# Patient Record
Sex: Female | Born: 1960 | Race: White | Hispanic: No | Smoking: Former smoker
Health system: Southern US, Community
[De-identification: ages and names within clinical notes are randomized; demographics above are authoritative.]

## PROBLEM LIST (undated history)

## (undated) DIAGNOSIS — D682 Hereditary deficiency of other clotting factors: Secondary | ICD-10-CM

## (undated) DIAGNOSIS — E039 Hypothyroidism, unspecified: Secondary | ICD-10-CM

## (undated) DIAGNOSIS — Z86010 Personal history of colonic polyps: Secondary | ICD-10-CM

## (undated) DIAGNOSIS — J45909 Unspecified asthma, uncomplicated: Secondary | ICD-10-CM

## (undated) DIAGNOSIS — R112 Nausea with vomiting, unspecified: Secondary | ICD-10-CM

## (undated) DIAGNOSIS — J383 Other diseases of vocal cords: Secondary | ICD-10-CM

## (undated) DIAGNOSIS — M1990 Unspecified osteoarthritis, unspecified site: Secondary | ICD-10-CM

## (undated) DIAGNOSIS — K219 Gastro-esophageal reflux disease without esophagitis: Secondary | ICD-10-CM

## (undated) DIAGNOSIS — R011 Cardiac murmur, unspecified: Secondary | ICD-10-CM

## (undated) DIAGNOSIS — G709 Myoneural disorder, unspecified: Secondary | ICD-10-CM

## (undated) DIAGNOSIS — N816 Rectocele: Secondary | ICD-10-CM

## (undated) DIAGNOSIS — R7303 Prediabetes: Secondary | ICD-10-CM

## (undated) DIAGNOSIS — E785 Hyperlipidemia, unspecified: Secondary | ICD-10-CM

## (undated) DIAGNOSIS — C801 Malignant (primary) neoplasm, unspecified: Secondary | ICD-10-CM

## (undated) DIAGNOSIS — C4490 Unspecified malignant neoplasm of skin, unspecified: Secondary | ICD-10-CM

## (undated) DIAGNOSIS — K760 Fatty (change of) liver, not elsewhere classified: Secondary | ICD-10-CM

## (undated) DIAGNOSIS — M797 Fibromyalgia: Secondary | ICD-10-CM

## (undated) DIAGNOSIS — R0600 Dyspnea, unspecified: Secondary | ICD-10-CM

## (undated) DIAGNOSIS — I10 Essential (primary) hypertension: Secondary | ICD-10-CM

## (undated) DIAGNOSIS — Z9889 Other specified postprocedural states: Secondary | ICD-10-CM

## (undated) HISTORY — DX: Fatty (change of) liver, not elsewhere classified: K76.0

## (undated) HISTORY — PX: DILATION AND CURETTAGE OF UTERUS: SHX78

## (undated) HISTORY — PX: APPENDECTOMY: SHX54

## (undated) HISTORY — DX: Cardiac murmur, unspecified: R01.1

## (undated) HISTORY — PX: BREAST SURGERY: SHX581

## (undated) HISTORY — PX: BREAST EXCISIONAL BIOPSY: SUR124

## (undated) HISTORY — DX: Rectocele: N81.6

## (undated) HISTORY — PX: NOSE SURGERY: SHX723

## (undated) HISTORY — DX: Personal history of colonic polyps: Z86.010

## (undated) HISTORY — PX: ABLATION: SHX5711

## (undated) HISTORY — DX: Other diseases of vocal cords: J38.3

---

## 2001-11-09 ENCOUNTER — Other Ambulatory Visit: Admission: RE | Admit: 2001-11-09 | Discharge: 2001-11-09 | Payer: Self-pay | Admitting: Obstetrics and Gynecology

## 2002-12-07 ENCOUNTER — Other Ambulatory Visit: Admission: RE | Admit: 2002-12-07 | Discharge: 2002-12-07 | Payer: Self-pay | Admitting: Obstetrics and Gynecology

## 2003-01-27 ENCOUNTER — Encounter (INDEPENDENT_AMBULATORY_CARE_PROVIDER_SITE_OTHER): Payer: Self-pay | Admitting: Specialist

## 2003-01-27 ENCOUNTER — Ambulatory Visit (HOSPITAL_COMMUNITY): Admission: RE | Admit: 2003-01-27 | Discharge: 2003-01-27 | Payer: Self-pay | Admitting: Obstetrics and Gynecology

## 2003-04-28 ENCOUNTER — Encounter: Payer: Self-pay | Admitting: Internal Medicine

## 2004-02-22 ENCOUNTER — Ambulatory Visit: Payer: Self-pay | Admitting: Internal Medicine

## 2004-03-04 ENCOUNTER — Other Ambulatory Visit: Admission: RE | Admit: 2004-03-04 | Discharge: 2004-03-04 | Payer: Self-pay | Admitting: Obstetrics and Gynecology

## 2004-03-13 ENCOUNTER — Encounter: Admission: RE | Admit: 2004-03-13 | Discharge: 2004-03-13 | Payer: Self-pay | Admitting: Family Medicine

## 2004-04-05 ENCOUNTER — Ambulatory Visit: Payer: Self-pay | Admitting: Internal Medicine

## 2004-07-04 ENCOUNTER — Ambulatory Visit (HOSPITAL_COMMUNITY): Admission: RE | Admit: 2004-07-04 | Discharge: 2004-07-04 | Payer: Self-pay | Admitting: Endocrinology

## 2004-12-10 ENCOUNTER — Ambulatory Visit (HOSPITAL_COMMUNITY): Admission: RE | Admit: 2004-12-10 | Discharge: 2004-12-10 | Payer: Self-pay | Admitting: Endocrinology

## 2005-03-13 ENCOUNTER — Encounter (INDEPENDENT_AMBULATORY_CARE_PROVIDER_SITE_OTHER): Payer: Self-pay | Admitting: Specialist

## 2005-03-13 ENCOUNTER — Encounter: Admission: RE | Admit: 2005-03-13 | Discharge: 2005-03-13 | Payer: Self-pay | Admitting: Endocrinology

## 2005-03-13 ENCOUNTER — Other Ambulatory Visit: Admission: RE | Admit: 2005-03-13 | Discharge: 2005-03-13 | Payer: Self-pay | Admitting: Interventional Radiology

## 2005-04-10 ENCOUNTER — Other Ambulatory Visit: Admission: RE | Admit: 2005-04-10 | Discharge: 2005-04-10 | Payer: Self-pay | Admitting: Obstetrics and Gynecology

## 2005-07-11 ENCOUNTER — Ambulatory Visit (HOSPITAL_COMMUNITY): Admission: RE | Admit: 2005-07-11 | Discharge: 2005-07-11 | Payer: Self-pay | Admitting: Endocrinology

## 2006-02-23 ENCOUNTER — Encounter: Admission: RE | Admit: 2006-02-23 | Discharge: 2006-02-23 | Payer: Self-pay | Admitting: *Deleted

## 2006-06-25 ENCOUNTER — Ambulatory Visit (HOSPITAL_COMMUNITY): Admission: RE | Admit: 2006-06-25 | Discharge: 2006-06-25 | Payer: Self-pay | Admitting: Family Medicine

## 2007-03-17 ENCOUNTER — Ambulatory Visit: Payer: Self-pay | Admitting: Internal Medicine

## 2007-09-27 ENCOUNTER — Encounter: Admission: RE | Admit: 2007-09-27 | Discharge: 2007-09-27 | Payer: Self-pay | Admitting: Rheumatology

## 2007-10-20 ENCOUNTER — Encounter: Admission: RE | Admit: 2007-10-20 | Discharge: 2007-10-20 | Payer: Self-pay | Admitting: Rheumatology

## 2007-12-23 ENCOUNTER — Telehealth: Payer: Self-pay | Admitting: Internal Medicine

## 2007-12-30 ENCOUNTER — Encounter: Admission: RE | Admit: 2007-12-30 | Discharge: 2007-12-30 | Payer: Self-pay | Admitting: Family Medicine

## 2008-02-23 ENCOUNTER — Ambulatory Visit: Payer: Self-pay | Admitting: Internal Medicine

## 2008-03-09 ENCOUNTER — Ambulatory Visit: Payer: Self-pay | Admitting: Internal Medicine

## 2008-03-20 ENCOUNTER — Telehealth: Payer: Self-pay | Admitting: Internal Medicine

## 2008-04-12 ENCOUNTER — Encounter: Admission: RE | Admit: 2008-04-12 | Discharge: 2008-04-12 | Payer: Self-pay | Admitting: Sports Medicine

## 2008-04-27 ENCOUNTER — Encounter: Admission: RE | Admit: 2008-04-27 | Discharge: 2008-04-27 | Payer: Self-pay | Admitting: Sports Medicine

## 2008-06-26 ENCOUNTER — Encounter: Admission: RE | Admit: 2008-06-26 | Discharge: 2008-06-26 | Payer: Self-pay | Admitting: Sports Medicine

## 2008-12-01 ENCOUNTER — Encounter: Admission: RE | Admit: 2008-12-01 | Discharge: 2008-12-01 | Payer: Self-pay | Admitting: Sports Medicine

## 2009-01-03 ENCOUNTER — Encounter: Admission: RE | Admit: 2009-01-03 | Discharge: 2009-01-03 | Payer: Self-pay | Admitting: Family Medicine

## 2009-02-05 ENCOUNTER — Encounter: Admission: RE | Admit: 2009-02-05 | Discharge: 2009-02-05 | Payer: Self-pay | Admitting: Obstetrics and Gynecology

## 2009-02-05 ENCOUNTER — Encounter: Admission: RE | Admit: 2009-02-05 | Discharge: 2009-02-05 | Payer: Self-pay | Admitting: Sports Medicine

## 2009-04-23 ENCOUNTER — Encounter: Admission: RE | Admit: 2009-04-23 | Discharge: 2009-04-23 | Payer: Self-pay | Admitting: Sports Medicine

## 2009-04-27 ENCOUNTER — Ambulatory Visit: Payer: Self-pay | Admitting: Oncology

## 2009-05-07 LAB — COMPREHENSIVE METABOLIC PANEL
BUN: 14 mg/dL (ref 6–23)
CO2: 28 mEq/L (ref 19–32)
Calcium: 8.9 mg/dL (ref 8.4–10.5)
Chloride: 103 mEq/L (ref 96–112)
Creatinine, Ser: 0.64 mg/dL (ref 0.40–1.20)
Glucose, Bld: 86 mg/dL (ref 70–99)

## 2009-05-07 LAB — CBC WITH DIFFERENTIAL/PLATELET
Basophils Absolute: 0.1 10*3/uL (ref 0.0–0.1)
Eosinophils Absolute: 0.1 10*3/uL (ref 0.0–0.5)
HCT: 39.3 % (ref 34.8–46.6)
HGB: 13.7 g/dL (ref 11.6–15.9)
MCH: 31 pg (ref 25.1–34.0)
MONO#: 0.8 10*3/uL (ref 0.1–0.9)
NEUT%: 64.8 % (ref 38.4–76.8)
WBC: 8.6 10*3/uL (ref 3.9–10.3)
lymph#: 2.1 10*3/uL (ref 0.9–3.3)

## 2009-05-11 LAB — HYPERCOAGULABLE PANEL, COMPREHENSIVE
AntiThromb III Func: 115 % (ref 76–126)
Anticardiolipin IgA: 4 APL U/mL (ref <22)
Anticardiolipin IgM: 3 MPL U/mL (ref <11)
Beta-2 Glyco I IgG: 1 G Units (ref <20)
Beta-2-Glycoprotein I IgA: 5 A Units (ref <20)
Beta-2-Glycoprotein I IgM: 8 M Units (ref <20)
Protein S Activity: 95 % (ref 69–129)

## 2009-05-15 ENCOUNTER — Telehealth (INDEPENDENT_AMBULATORY_CARE_PROVIDER_SITE_OTHER): Payer: Self-pay | Admitting: *Deleted

## 2009-08-01 ENCOUNTER — Encounter: Admission: RE | Admit: 2009-08-01 | Discharge: 2009-08-01 | Payer: Self-pay | Admitting: Family Medicine

## 2009-10-15 ENCOUNTER — Encounter: Admission: RE | Admit: 2009-10-15 | Discharge: 2009-10-15 | Payer: Self-pay | Admitting: Sports Medicine

## 2010-02-27 ENCOUNTER — Ambulatory Visit (HOSPITAL_COMMUNITY)
Admission: RE | Admit: 2010-02-27 | Discharge: 2010-02-27 | Payer: Self-pay | Source: Home / Self Care | Attending: Family Medicine | Admitting: Family Medicine

## 2010-03-09 ENCOUNTER — Encounter: Payer: Self-pay | Admitting: Endocrinology

## 2010-03-09 ENCOUNTER — Encounter: Payer: Self-pay | Admitting: Family Medicine

## 2010-03-10 ENCOUNTER — Encounter: Payer: Self-pay | Admitting: Endocrinology

## 2010-03-19 NOTE — Progress Notes (Signed)
Summary: 90-day Nexium RX   Phone Note Call from Patient Call back at [phone]   Summary of Call: pt presents at office requesting 90 day rx for Nexium.  RX sent.  Advised pt if she has not heard from [facility] by Thursday of this week to call me back.  Pt agreeable. Initial call taken by: Francee Piccolo CMA Duncan Dull),  May 15, 2009 10:57 AM    New/Updated Medications: NEXIUM 40 MG CPDR (ESOMEPRAZOLE MAGNESIUM) one tablet by mouth once daily Prescriptions: NEXIUM 40 MG CPDR (ESOMEPRAZOLE MAGNESIUM) one tablet by mouth once daily  #90 x 1   Entered by:   Francee Piccolo CMA (AAMA)   Authorized by:   Iva Boop MD, [facility]   Signed by:   Francee Piccolo CMA (AAMA) on 05/15/2009   Method used:   Electronically to        [facility]* (mail-order)             ,          Ph: [phone]       Fax: [phone]   RxID:   [id number]

## 2010-03-19 NOTE — Procedures (Signed)
Summary: EGD: Esophagitis, Gastritis   EGD  Procedure date:  04/28/2003  Findings:      Assessment Diagnoses: 530.11: Esophagitis, Reflux.  535.50: Gastritis, Unspecified.   Comments: Esophageal erosion likely from reflux or vomiting. Antral erosions NSAID vs. H. pylori.  RUT Negative  Patient Name: Tracey Rodriguez, Tracey Rodriguez MRN:  Procedure Procedures: Panendoscopy (EGD) CPT: 43235.    with biopsy(s)/brushing(s). CPT: D1846139.  Personnel: Endoscopist: Iva Boop, MD, [facility].  Exam Location: Exam performed in Outpatient Clinic. Outpatient  Patient Consent: Procedure, Alternatives, Risks and Benefits discussed, consent obtained, from patient. Consent was obtained by the RN.  Indications Symptoms: Vomiting. Reflux symptoms  History  Current Medications: Patient is not currently taking Coumadin.  Pre-Exam Physical: Performed Apr 28, 2003  Cardio-pulmonary exam, HEENT exam, Abdominal exam, Mental status exam WNL.  Comments: CBC, CMET, TSH all normal Exam Exam Info: Maximum depth of insertion Duodenum, intended Duodenum. Patient position: on left side. Vocal cords not visualized. Gastric retroflexion performed. Images taken. ASA Classification: I. Tolerance: excellent.  Sedation Meds: Patient assessed and found to be appropriate for moderate (conscious) sedation. Fentanyl 50 mcg. given IV. Versed 5 mg. given IV. Cetacaine Spray 2 sprays given aerosolized.  Monitoring: BP and pulse monitoring done. Oximetry used. Supplemental O2 given  Findings Normal: Proximal Esophagus to Mid Esophagus.  ESOPHAGEAL INFLAMMATION: suspected as a result of reflux. Severity is moderate, erosions present.  Length of inflammation: 0.5 cm. [place] Classification: Grade A. ICD9: Esophagitis, Reflux: 530.11. Comments: one tiny erosion seen.  Normal: Cardia to Body.  MUCOSAL ABNORMALITY: Antrum. Erosions present. Biopsy/Mucosal Abn taken. RUT done, results pending. ICD9: Gastritis,  Unspecified: 535. 50. Comment: a few erosions, biopsy for H. pylori.  Normal: Duodenal Bulb to Duodenal 2nd Portion.   Assessment Abnormal examination, see findings above.  Diagnoses: 530.11: Esophagitis, Reflux.  535.50: Gastritis, Unspecified.   Comments: Esophageal erosion likely from reflux or vomiting. Antral erosions NSAID vs. H. pylori. Events  Unplanned Intervention: No unplanned interventions were required.  Plans Medication(s): Await pathology.  Comments: Start PPI (Prevacid 30 mg samples given) and treat H. pylori if positive. Fiber and or stool softeners for constipation. Disposition: After procedure patient sent to recovery. After recovery patient sent home.  Scheduling: Clinic Visit, to Iva Boop, MD, [facility], about 1 month
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
## 2010-07-02 NOTE — Assessment & Plan Note (Signed)
[facility]                         GASTROENTEROLOGY OFFICE NOTE   DEVONNA, OBOYLE                    MRN:          [id number]  DATE:03/17/2007                            DOB:          [birth year]    ADDENDUM:   Over 15 minutes of time were spent with the patient today, over half of  which was in counseling and coordination of care.     Iva Boop, MD,FACG     CEG/MedQ  DD: 03/17/2007  DT: 03/17/2007  Job #: [id number]

## 2010-07-05 NOTE — Op Note (Signed)
NAME:  ANU, STAGNER NO.:  [id number]   MEDICAL RECORD NO.:  [id number]                   PATIENT TYPE:  AMB   LOCATION:  [facility]                                  FACILITY:  [facility]   PHYSICIAN:  Randye Lobo, M.D.                DATE OF BIRTH:  [birth year]   DATE OF PROCEDURE:  01/27/2003  DATE OF DISCHARGE:                                 OPERATIVE REPORT   PREOPERATIVE DIAGNOSES:  1. Endometrial polyp.  2. Menorrhagia.  3. Vulvar nevus.   POSTOPERATIVE DIAGNOSES:  1. Menorrhagia.  2. Vulvar nevus.   PROCEDURE:  1. Hysteroscopy.  2. Dilation and curettage.  3. Endometrial cryoablation.  4. Removal of vulvar nevus.   SURGEON:  Randye Lobo, M.D.   ANESTHESIA:  1. General endotracheal.  2. Paracervical block with 1% lidocaine.   INTRAVENOUS FLUIDS:  1. 1000 mL Ringer's lactate.  2. 3% sorbitol deficit, 10 mL.   ESTIMATED BLOOD LOSS:  Minimal.   URINE OUTPUT:  I&O catheterization prior to procedure.   COMPLICATIONS:  None.   INDICATIONS FOR PROCEDURE:  The patient is a 50 year old gravida 3, para 2-0-  1-2, Caucasian female, who presented with heavy menses lasting every 7-9  days along with episodes of bleeding two times per month in July and August  2004.  An endometrial biopsy performed December 21, 2002, documented a benign  endometrial polyp.  A sonohysterogram performed on December 30, 2002,  documented an endometriosis stripe of 10 mm with irregularity thought to be  consistent with a polyp.  There was also an anterior subserosal fibroid in  the mid fundal region measuring 1.5 cm.  The left ovary had two simple cysts  measuring 2.5 and 1 cm.  No abnormalities were noted in the right adnexa  region.  The patient did have a history of a prior hysteroscopy D&C in June  2000, at which time a benign endometrial polyp was removed.  The patient, in  addition, has noted a mole on her left vulvar thigh region which she would  like to have  removed.  A plan is made to proceed with a hysteroscopic  polypectomy on the dilation and curettage, endometrial cryoablation, and  removal of the vulvar nevus, and to the risks, benefits, and alternatives  were discussed with her.   FINDINGS:  Exam under anesthesia revealed a small and anteverted, mobile  uterus.  No adnexal masses were appreciated.   At the time of hysteroscopy, no obvious endometrial polyp was appreciated.  There was evidence of some fluffy endometrium.  The cornual regions were  noted to be normal bilaterally.  The endocervix and the lower uterine  segment were normal.   There was a 5 mm nonpigmented vulvar nevus along the left-hand side, near  the proximal side.   SPECIMENS:  Endometrial curettings and a vulvar nevus were sent to  pathology.   DESCRIPTION OF PROCEDURE:  With an IV in place, the patient was taken to the  operating room after she was proper identified.  The patient did receive  Ancef 1 g intravenously for antibiotic prophylaxis.  The patient received  general anesthesia in the operating room, and she was then placed in the  dorsal lithotomy position.  The vagina and the vulva were sterilely prepped  and draped.  The patient was I&O catheterized of urine.   A speculum was placed inside the vagina; the single-tooth tenaculum was  placed on the anterior cervical lip.  A paracervical block was performed in  standard fashion with a total of 10 mL of 1% lidocaine.  The uterus was  sounded to 7 cm.  The cervix was then dilated to a #29 Pratt dilator.  The  resectoscope was then inserted through the os and into the uterine cavity  under the continuous infusion of sorbitol.  The findings are as noted above.  No obvious endometrial polyp or intracavitary pathology was identified.  The  resectoscope was therefore removed, and the endometrium was curetted in all  four quadrant using both a sharp and then a serrated curette.  At the  termination of the  curettage, the endometrium had a characteristically  gritty texture to it.  The specimen was removed and sent to pathology.   The cryoablation was performed next.  A Cryoprobe was placed through the  cervical os to the level of the uterine fundus and then into the patient's  left cornu region where a freeze cycle of six minutes was performed.  The  Cryoprobe was then re-warmed, and the Cryoprobe was entirely removed from  within the uterine cavity.  The probe was then re-placed through the cervix  to the level of the fundus, and the probe was at this time placed in the  right cornu region.  Again, a freeze cycle of six minutes was performed.  The probe was re-warmed and then removed.  The probe was placed one final  time in the midline to the level of the fundus and then withdrawn slightly,  and an additional freeze of one minute was performed.  The probe tip was  then re-warmed, and the cryoprobe was removed from within the uterine  cavity.  The single-tooth tenaculum was removed as well.  Hemostasis was  noted to be excellent, and the speculum was therefore removed.   Sharp excision was used to remove the nevus from the left vulvar thigh  region.  The specimen was sent to pathology, and an interrupted suture of 3-  0 Vicryl was placed in the skin.  A Steri-Strip was placed over this.   This concluded the patient's procedure.  There were no complications.  All  needle, instrument, and sponge counts were correct.                                               Randye Lobo, M.D.   BES/MEDQ  D:  01/27/2003  T:  01/28/2003  Job:  [id number]

## 2010-08-08 ENCOUNTER — Other Ambulatory Visit: Payer: Self-pay | Admitting: Internal Medicine

## 2010-08-08 MED ORDER — ESOMEPRAZOLE MAGNESIUM 40 MG PO CPDR: 40.0000 mg | DELAYED_RELEASE_CAPSULE | Freq: Every day | ORAL | Status: DC

## 2010-08-08 NOTE — Telephone Encounter (Signed)
Patient called request refill on Nexium. I talked to patient to let her know that she hasn't been seen in a while, no office visit since 2009 but had a colonoscopy in 2010. I told patient I would fill medication for 90 days only until I ask Dr. Leone Payor if she needed to be seen.

## 2010-08-12 NOTE — Telephone Encounter (Signed)
90 day refill is fine now but she should get refills via PCP if doing well If having GERD/GI issues and needs me then schedule REV

## 2010-08-14 NOTE — Telephone Encounter (Signed)
Patient informed. Having no GERD/GI problems right now.

## 2010-08-23 ENCOUNTER — Other Ambulatory Visit (HOSPITAL_COMMUNITY): Payer: Self-pay | Admitting: Family Medicine

## 2010-08-23 DIAGNOSIS — E039 Hypothyroidism, unspecified: Secondary | ICD-10-CM

## 2010-08-28 ENCOUNTER — Other Ambulatory Visit (HOSPITAL_COMMUNITY): Payer: Self-pay

## 2010-09-03 ENCOUNTER — Ambulatory Visit (HOSPITAL_COMMUNITY)
Admission: RE | Admit: 2010-09-03 | Discharge: 2010-09-03 | Disposition: A | Discharge status: Home / Self Care | Payer: 59 | Source: Ambulatory Visit | Attending: Family Medicine | Admitting: Family Medicine

## 2010-09-03 DIAGNOSIS — E039 Hypothyroidism, unspecified: Secondary | ICD-10-CM

## 2010-09-03 DIAGNOSIS — E042 Nontoxic multinodular goiter: Secondary | ICD-10-CM | POA: Insufficient documentation

## 2011-02-03 ENCOUNTER — Other Ambulatory Visit: Payer: Self-pay | Admitting: Internal Medicine

## 2011-03-19 ENCOUNTER — Other Ambulatory Visit: Payer: Self-pay | Admitting: Family Medicine

## 2011-03-19 DIAGNOSIS — Z780 Asymptomatic menopausal state: Secondary | ICD-10-CM

## 2011-03-26 ENCOUNTER — Ambulatory Visit
Admission: RE | Admit: 2011-03-26 | Discharge: 2011-03-26 | Disposition: A | Discharge status: Home / Self Care | Payer: 59 | Source: Ambulatory Visit | Attending: Family Medicine | Admitting: Family Medicine

## 2011-03-26 DIAGNOSIS — Z780 Asymptomatic menopausal state: Secondary | ICD-10-CM

## 2012-03-19 ENCOUNTER — Other Ambulatory Visit: Payer: Self-pay | Admitting: Obstetrics and Gynecology

## 2012-03-23 ENCOUNTER — Encounter (HOSPITAL_COMMUNITY): Payer: Self-pay | Admitting: *Deleted

## 2012-03-23 ENCOUNTER — Encounter (HOSPITAL_COMMUNITY): Payer: Self-pay | Admitting: Pharmacist

## 2012-04-05 ENCOUNTER — Ambulatory Visit (HOSPITAL_COMMUNITY): Payer: 59 | Admitting: Anesthesiology

## 2012-04-05 ENCOUNTER — Encounter (HOSPITAL_COMMUNITY): Payer: Self-pay | Admitting: Anesthesiology

## 2012-04-05 ENCOUNTER — Encounter (HOSPITAL_COMMUNITY)
Admission: RE | Disposition: A | Discharge status: Home / Self Care | Payer: Self-pay | Source: Ambulatory Visit | Attending: Obstetrics and Gynecology

## 2012-04-05 ENCOUNTER — Encounter (HOSPITAL_COMMUNITY): Payer: Self-pay | Admitting: *Deleted

## 2012-04-05 ENCOUNTER — Ambulatory Visit (HOSPITAL_COMMUNITY)
Admission: RE | Admit: 2012-04-05 | Discharge: 2012-04-05 | Disposition: A | Discharge status: Home / Self Care | Payer: 59 | Source: Ambulatory Visit | Attending: Obstetrics and Gynecology | Admitting: Obstetrics and Gynecology

## 2012-04-05 DIAGNOSIS — N949 Unspecified condition associated with female genital organs and menstrual cycle: Secondary | ICD-10-CM | POA: Insufficient documentation

## 2012-04-05 DIAGNOSIS — D682 Hereditary deficiency of other clotting factors: Secondary | ICD-10-CM | POA: Insufficient documentation

## 2012-04-05 DIAGNOSIS — E039 Hypothyroidism, unspecified: Secondary | ICD-10-CM | POA: Insufficient documentation

## 2012-04-05 DIAGNOSIS — N938 Other specified abnormal uterine and vaginal bleeding: Secondary | ICD-10-CM | POA: Insufficient documentation

## 2012-04-05 HISTORY — DX: Unspecified osteoarthritis, unspecified site: M19.90

## 2012-04-05 HISTORY — DX: Nausea with vomiting, unspecified: R11.2

## 2012-04-05 HISTORY — DX: Other specified postprocedural states: Z98.890

## 2012-04-05 HISTORY — DX: Hypothyroidism, unspecified: E03.9

## 2012-04-05 HISTORY — PX: DILATATION & CURRETTAGE/HYSTEROSCOPY WITH RESECTOCOPE: SHX5572

## 2012-04-05 HISTORY — DX: Hereditary deficiency of other clotting factors: D68.2

## 2012-04-05 HISTORY — DX: Hyperlipidemia, unspecified: E78.5

## 2012-04-05 HISTORY — DX: Malignant (primary) neoplasm, unspecified: C80.1

## 2012-04-05 HISTORY — DX: Gastro-esophageal reflux disease without esophagitis: K21.9

## 2012-04-05 HISTORY — DX: Myoneural disorder, unspecified: G70.9

## 2012-04-05 HISTORY — DX: Fibromyalgia: M79.7

## 2012-04-05 LAB — CBC
HCT: 38.1 % (ref 36.0–46.0)
Platelets: 291 10*3/uL (ref 150–400)
RDW: 12.8 % (ref 11.5–15.5)
WBC: 6.8 10*3/uL (ref 4.0–10.5)

## 2012-04-05 LAB — PROTIME-INR
INR: 0.85 (ref 0.00–1.49)
Prothrombin Time: 11.6 seconds (ref 11.6–15.2)

## 2012-04-05 LAB — APTT: aPTT: 26 seconds (ref 24–37)

## 2012-04-05 SURGERY — DILATATION & CURETTAGE/HYSTEROSCOPY WITH RESECTOCOPE
Anesthesia: General | Site: Vagina | Wound class: Clean Contaminated

## 2012-04-05 MED ORDER — GLYCINE 1.5 % IR SOLN
Status: DC | PRN
  Administered 2012-04-05: 3000 mL

## 2012-04-05 MED ORDER — DEXAMETHASONE SODIUM PHOSPHATE 4 MG/ML IJ SOLN
INTRAMUSCULAR | Status: DC | PRN
  Administered 2012-04-05: 10 mg via INTRAVENOUS

## 2012-04-05 MED ORDER — SCOPOLAMINE 1 MG/3DAYS TD PT72
1.0000 | MEDICATED_PATCH | TRANSDERMAL | Status: DC
  Administered 2012-04-05: 1.5 mg via TRANSDERMAL

## 2012-04-05 MED ORDER — FENTANYL CITRATE 0.05 MG/ML IJ SOLN: 25.0000 ug | INTRAMUSCULAR | Status: DC | PRN

## 2012-04-05 MED ORDER — PROPOFOL 10 MG/ML IV EMUL
INTRAVENOUS | Status: AC
  Filled 2012-04-05: qty 20

## 2012-04-05 MED ORDER — LIDOCAINE HCL 1 % IJ SOLN
INTRAMUSCULAR | Status: DC | PRN
  Administered 2012-04-05: 10 mL

## 2012-04-05 MED ORDER — MIDAZOLAM HCL 5 MG/5ML IJ SOLN
INTRAMUSCULAR | Status: DC | PRN
  Administered 2012-04-05 (×2): 1 mg via INTRAVENOUS

## 2012-04-05 MED ORDER — SCOPOLAMINE 1 MG/3DAYS TD PT72
MEDICATED_PATCH | TRANSDERMAL | Status: AC
  Filled 2012-04-05: qty 1

## 2012-04-05 MED ORDER — FAMOTIDINE 20 MG PO TABS
ORAL_TABLET | ORAL | Status: AC
  Administered 2012-04-05: 20 mg via ORAL
  Filled 2012-04-05: qty 1

## 2012-04-05 MED ORDER — MIDAZOLAM HCL 2 MG/2ML IJ SOLN: 0.5000 mg | Freq: Once | INTRAMUSCULAR | Status: DC | PRN

## 2012-04-05 MED ORDER — KETOROLAC TROMETHAMINE 30 MG/ML IJ SOLN
INTRAMUSCULAR | Status: DC | PRN
  Administered 2012-04-05: 30 mg via INTRAVENOUS

## 2012-04-05 MED ORDER — ONDANSETRON HCL 4 MG/2ML IJ SOLN
INTRAMUSCULAR | Status: DC | PRN
  Administered 2012-04-05: 4 mg via INTRAVENOUS

## 2012-04-05 MED ORDER — ONDANSETRON HCL 4 MG/2ML IJ SOLN
INTRAMUSCULAR | Status: AC
  Filled 2012-04-05: qty 2

## 2012-04-05 MED ORDER — PROPOFOL 10 MG/ML IV EMUL
INTRAVENOUS | Status: DC | PRN
  Administered 2012-04-05: 180 mg via INTRAVENOUS

## 2012-04-05 MED ORDER — LIDOCAINE HCL (CARDIAC) 20 MG/ML IV SOLN
INTRAVENOUS | Status: DC | PRN
  Administered 2012-04-05 (×2): 30 mg via INTRAVENOUS

## 2012-04-05 MED ORDER — KETOROLAC TROMETHAMINE 30 MG/ML IJ SOLN
INTRAMUSCULAR | Status: AC
  Filled 2012-04-05: qty 1

## 2012-04-05 MED ORDER — FENTANYL CITRATE 0.05 MG/ML IJ SOLN
INTRAMUSCULAR | Status: DC | PRN
  Administered 2012-04-05 (×2): 50 ug via INTRAVENOUS

## 2012-04-05 MED ORDER — FENTANYL CITRATE 0.05 MG/ML IJ SOLN
INTRAMUSCULAR | Status: AC
  Filled 2012-04-05: qty 2

## 2012-04-05 MED ORDER — MIDAZOLAM HCL 2 MG/2ML IJ SOLN
INTRAMUSCULAR | Status: AC
  Filled 2012-04-05: qty 2

## 2012-04-05 MED ORDER — LIDOCAINE HCL (CARDIAC) 20 MG/ML IV SOLN
INTRAVENOUS | Status: AC
  Filled 2012-04-05: qty 5

## 2012-04-05 MED ORDER — PROMETHAZINE HCL 25 MG/ML IJ SOLN: 6.2500 mg | INTRAMUSCULAR | Status: DC | PRN

## 2012-04-05 MED ORDER — MEPERIDINE HCL 25 MG/ML IJ SOLN: 6.2500 mg | INTRAMUSCULAR | Status: DC | PRN

## 2012-04-05 MED ORDER — LACTATED RINGERS IV SOLN
INTRAVENOUS | Status: DC
  Administered 2012-04-05: 09:00:00 via INTRAVENOUS
  Administered 2012-04-05: 50 mL/h via INTRAVENOUS

## 2012-04-05 MED ORDER — DEXAMETHASONE SODIUM PHOSPHATE 10 MG/ML IJ SOLN
INTRAMUSCULAR | Status: AC
  Filled 2012-04-05: qty 1

## 2012-04-05 SURGICAL SUPPLY — 16 items
CANISTER SUCTION 2500CC (MISCELLANEOUS) ×2 IMPLANT
CATH ROBINSON RED A/P 16FR (CATHETERS) ×2 IMPLANT
CLOTH BEACON ORANGE TIMEOUT ST (SAFETY) ×2 IMPLANT
CONTAINER PREFILL 10% NBF 60ML (FORM) ×3 IMPLANT
DRESSING TELFA 8X3 (GAUZE/BANDAGES/DRESSINGS) ×2 IMPLANT
ELECT REM PT RETURN 9FT ADLT (ELECTROSURGICAL) ×2
ELECTRODE REM PT RTRN 9FT ADLT (ELECTROSURGICAL) ×1 IMPLANT
GLOVE BIO SURGEON STRL SZ 6.5 (GLOVE) ×2 IMPLANT
GLOVE BIOGEL PI IND STRL 6.5 (GLOVE) ×2 IMPLANT
GLOVE BIOGEL PI INDICATOR 6.5 (GLOVE) ×2
GOWN STRL REIN XL XLG (GOWN DISPOSABLE) ×4 IMPLANT
LOOP ANGLED CUTTING 22FR (CUTTING LOOP) IMPLANT
PACK HYSTEROSCOPY LF (CUSTOM PROCEDURE TRAY) ×2 IMPLANT
PAD OB MATERNITY 4.3X12.25 (PERSONAL CARE ITEMS) ×2 IMPLANT
TOWEL OR 17X24 6PK STRL BLUE (TOWEL DISPOSABLE) ×4 IMPLANT
WATER STERILE IRR 1000ML POUR (IV SOLUTION) ×1 IMPLANT

## 2012-04-05 NOTE — Transfer of Care (Signed)
Immediate Anesthesia Transfer of Care Note  Patient: Tracey Rodriguez  Procedure(s) Performed: Procedure(s) with comments: DILATATION & CURETTAGE/HYSTEROSCOPY WITH RESECTOCOPE (N/A) - poss resectoscope  Patient Location: PACU  Anesthesia Type:General  Level of Consciousness: awake, alert  and oriented  Airway & Oxygen Therapy: Patient Spontanous Breathing and Patient connected to nasal cannula oxygen  Post-op Assessment: Report given to PACU RN and Post -op Vital signs reviewed and stable  Post vital signs: Reviewed and stable  Complications: No apparent anesthesia complications

## 2012-04-05 NOTE — Anesthesia Preprocedure Evaluation (Addendum)
Anesthesia Evaluation    Airway       Dental   Pulmonary          Cardiovascular     Neuro/Psych    GI/Hepatic GERD-  ,  Endo/Other  Hypothyroidism   Renal/GU      Musculoskeletal   Abdominal   Peds  Hematology   Anesthesia Other Findings Anxious; child died in OR (from anesthesia overdose??) Hypothyroidism .Marland Kitchen...goiter    Factor V deficiency      GERD.Marland KitchenMarland KitchenWill give am nexium    Arthritis    deg disc disease  - lower back, neck    sciatic nerve    Reproductive/Obstetrics                          Anesthesia Physical Anesthesia Plan  ASA: II  Anesthesia Plan: General   Post-op Pain Management:    Induction: Intravenous  Airway Management Planned: LMA  Additional Equipment:   Intra-op Plan:   Post-operative Plan:   Informed Consent: I have reviewed the patients History and Physical, chart, labs and discussed the procedure including the risks, benefits and alternatives for the proposed anesthesia with the patient or authorized representative who has indicated his/her understanding and acceptance.   Dental Advisory Given  Plan Discussed with: CRNA and Surgeon  Anesthesia Plan Comments: (  Discussed  general anesthesia, including possible nausea, instrumentation of airway, sore throat,pulmonary aspiration, etc. I asked if the were any outstanding questions, or  concerns before we proceeded. )        Anesthesia Quick Evaluation

## 2012-04-05 NOTE — Op Note (Signed)
Pre-op dx: DUB Post op dx: same Procedure: Dilation, curettage, hysteroscopy Surgeon: Delray Alt Anesthesia: IV sedation, 10cc 1% lidocaine EBL: <10cc Fluids: 500cc UOP: 50cc cath'd pre-op Specimen: ECC Findings: 8-10 wk size AV uterus, normal appearing cervix, very stenotic, unable to reach uterine cavity Complications: none Condition: stable to PACU

## 2012-04-05 NOTE — H&P (Signed)
52 y.o. yo complains of intermittent abnormal vaginal bleeding.  Pt reports history of fibroids and ablation.  Past Medical History  Diagnosis Date  . Hypothyroidism     goiter  . Factor V deficiency   . GERD (gastroesophageal reflux disease)   . Arthritis     deg disc disease  - lower back, neck  . Neuromuscular disorder     sciatic nerve  . Fibromyalgia   . SVD (spontaneous vaginal delivery)     x 2  . Cancer     left breast surgery-lumpectomy  . PONV (postoperative nausea and vomiting)   . Hyperlipidemia    Past Surgical History  Procedure Laterality Date  . Ablation    . Appendectomy    . Breast surgery      left- lumpectomy  . Dilation and curettage of uterus    . Nose surgery      History   Social History  . Marital Status: Married    Spouse Name: N/A    Number of Children: N/A  . Years of Education: N/A   Occupational History  . Not on file.   Social History Main Topics  . Smoking status: Former Smoker -- 3.00 packs/day for 22 years    Types: Cigarettes    Quit date: 02/18/2000  . Smokeless tobacco: Never Used  . Alcohol Use: Yes     Comment: socially  . Drug Use: No  . Sexually Active: Yes    Birth Control/ Protection: Post-menopausal   Other Topics Concern  . Not on file   Social History Narrative  . No narrative on file    No current facility-administered medications on file prior to encounter.   Current Outpatient Prescriptions on File Prior to Encounter  Medication Sig Dispense Refill  . esomeprazole (NEXIUM) 40 MG capsule Take 1 capsule (40 mg total) by mouth daily.  90 capsule  0    Allergies  Allergen Reactions  . Etodolac     REACTION: dyspnea/tightness in chest  . Sulfonamide Derivatives     REACTION: vomiting    @VITALS2 @  Lungs: clear to ascultation Cor:  RRR Abdomen:  soft, nontender, nondistended. Ex:  no cords, erythema Pelvic:  Def to OR  A:  Abnormal uterine bleeding   P:  D&C hysteroscopy, polypectomy.   All  risks, benefits and alternatives d/w patient and she desires to proceed.     Philip Aspen

## 2012-04-05 NOTE — Op Note (Signed)
NAME:  Tracey Rodriguez, Tracey Rodriguez NO.:  [id number]  MEDICAL RECORD NO.:  [id number]  LOCATION:  [facility]                          FACILITY:  [facility]  PHYSICIAN:  Philip Aspen, DO    DATE OF BIRTH:  [birth year]  DATE OF PROCEDURE:  04/05/2012 DATE OF DISCHARGE:                              OPERATIVE REPORT   PREOPERATIVE DIAGNOSIS:  Dysfunctional uterine bleeding.  POSTOPERATIVE DIAGNOSIS:  Dysfunctional uterine bleeding.  PROCEDURE:  Dilation and curettage hysteroscopy.  SURGEON:  Philip Aspen, DO  ANESTHESIA:  IV sedation with 10 mL 1% lidocaine, paracervical block.  ESTIMATED BLOOD LOSS:  Less than 10 mL.  FLUIDS:  500 mL.  URINE OUTPUT:  50 mL, catheterized preoperatively.  SPECIMENS:  ECC.  FINDINGS:  An 8-10 week size, firm, and __________ anteverted uterus. Normal appearing cervix.  __________ unable to reach the uterine cavity due to __________.  COMPLICATIONS:  None.  CONDITION:  Stable to PACU.  DESCRIPTION OF PROCEDURE:  Risks, benefits, side effects, expectation of procedure were reviewed with the patient, and consent reaffirmed.  The patient was then taken to the operating room where IV sedation was administered and found to be adequate.  She was then prepped and draped in normal sterile fashion in dorsal lithotomy position.  A speculum was placed in the vagina to visualize the cervix which was grasped with single-tooth tenaculum at the anterior lip.  The speculum was then removed and a weighted speculum was placed in the posterior aspect of the vagina.  The uterine sound __________ stenotic cervix, so a graduated __________ was not able to be passed.  The hysteroscope was introduced into the cervix and the canal was visualized and very stenotic at the anterior os.  __________ possible cavity opening beyond the stenotic area __________ the hysteroscope was then removed.  The graduated sound again introduced to try to __________ through that  inner os.  Upon visualization of the hysteroscope second time, it appeared like the graduated sound was making a second false passage near the original passage that look like __________ uterine cavity.  It was felt at this point that due to the patient's prior ablation procedures and stenosis, it was not in her best interest to proceed with the procedure. Due to risk of perforation, all instruments were removed.  The ECC was obtained and sent to Pathology, and the patient tolerated the procedure well. Sponge, lap, and needle counts were correct x2.  The patient was brought to the recovery room in stable condition.  All instruments removed __________.  Hemostasis was assured at that time.          ______________________________ Philip Aspen, DO     Keller/MEDQ  D:  04/05/2012  T:  04/05/2012  Job:  [id number]

## 2012-04-05 NOTE — Anesthesia Postprocedure Evaluation (Signed)
  Anesthesia Post Note  Patient: Tracey Rodriguez  Procedure(s) Performed: Procedure(s) (LRB): hysteroscopy with endocervical curretting (N/A)  Anesthesia type: GA  Patient location: PACU  Post pain: Pain level controlled  Post assessment: Post-op Vital signs reviewed  Last Vitals:  Filed Vitals:   04/05/12 0930  BP: 123/52  Pulse: 67  Temp:   Resp: 16    Post vital signs: Reviewed  Level of consciousness: sedated  Complications: No apparent anesthesia complications

## 2012-04-06 ENCOUNTER — Encounter (HOSPITAL_COMMUNITY): Payer: Self-pay | Admitting: Obstetrics and Gynecology

## 2012-09-14 ENCOUNTER — Other Ambulatory Visit (HOSPITAL_COMMUNITY): Payer: Self-pay | Admitting: Rheumatology

## 2012-09-14 ENCOUNTER — Ambulatory Visit (HOSPITAL_COMMUNITY): Admission: RE | Admit: 2012-09-14 | Payer: 59 | Source: Ambulatory Visit

## 2012-09-14 DIAGNOSIS — M7989 Other specified soft tissue disorders: Secondary | ICD-10-CM

## 2012-09-14 DIAGNOSIS — M79605 Pain in left leg: Secondary | ICD-10-CM

## 2012-11-02 ENCOUNTER — Other Ambulatory Visit (HOSPITAL_COMMUNITY): Payer: Self-pay | Admitting: Family Medicine

## 2012-11-02 DIAGNOSIS — R0609 Other forms of dyspnea: Secondary | ICD-10-CM

## 2012-11-10 ENCOUNTER — Ambulatory Visit (HOSPITAL_COMMUNITY)
Admission: RE | Admit: 2012-11-10 | Discharge: 2012-11-10 | Disposition: A | Discharge status: Home / Self Care | Payer: 59 | Source: Ambulatory Visit | Attending: Family Medicine | Admitting: Family Medicine

## 2012-11-10 DIAGNOSIS — R0989 Other specified symptoms and signs involving the circulatory and respiratory systems: Secondary | ICD-10-CM | POA: Insufficient documentation

## 2012-11-10 DIAGNOSIS — R0609 Other forms of dyspnea: Secondary | ICD-10-CM | POA: Insufficient documentation

## 2012-11-10 MED ORDER — ALBUTEROL SULFATE (5 MG/ML) 0.5% IN NEBU
2.5000 mg | INHALATION_SOLUTION | Freq: Once | RESPIRATORY_TRACT | Status: AC
  Administered 2012-11-10: 2.5 mg via RESPIRATORY_TRACT

## 2012-11-16 ENCOUNTER — Other Ambulatory Visit: Payer: Self-pay | Admitting: Family Medicine

## 2012-11-16 ENCOUNTER — Ambulatory Visit
Admission: RE | Admit: 2012-11-16 | Discharge: 2012-11-16 | Disposition: A | Discharge status: Home / Self Care | Payer: 59 | Source: Ambulatory Visit | Attending: Family Medicine | Admitting: Family Medicine

## 2012-11-16 DIAGNOSIS — R0989 Other specified symptoms and signs involving the circulatory and respiratory systems: Secondary | ICD-10-CM

## 2012-12-23 ENCOUNTER — Other Ambulatory Visit: Payer: Self-pay

## 2013-02-01 ENCOUNTER — Institutional Professional Consult (permissible substitution): Payer: 59 | Admitting: Emergency Medicine

## 2013-02-02 ENCOUNTER — Encounter: Payer: Self-pay | Admitting: Emergency Medicine

## 2013-02-02 ENCOUNTER — Ambulatory Visit (INDEPENDENT_AMBULATORY_CARE_PROVIDER_SITE_OTHER): Payer: 59 | Admitting: Emergency Medicine

## 2013-02-02 VITALS — BP 150/88 | HR 92 | Ht 66.5 in | Wt 196.6 lb

## 2013-02-02 DIAGNOSIS — J984 Other disorders of lung: Secondary | ICD-10-CM | POA: Insufficient documentation

## 2013-02-02 DIAGNOSIS — R0600 Dyspnea, unspecified: Secondary | ICD-10-CM | POA: Insufficient documentation

## 2013-02-02 DIAGNOSIS — R0609 Other forms of dyspnea: Secondary | ICD-10-CM

## 2013-02-02 DIAGNOSIS — J449 Chronic obstructive pulmonary disease, unspecified: Secondary | ICD-10-CM

## 2013-02-02 NOTE — Progress Notes (Signed)
Subjective:    Patient ID: Tracey Rodriguez, female    DOB: Aug 29, 1960, 52 y.o.   MRN: [id number]  HPI 52 yo woman, former heavy smoker (66 pk-yrs), hx FM, chronic pain, hyperlipidemia, ? RA (Dr Corliss Skains). She is referred for evaluation of dyspnea.  She describes longstanding dyspnea w exertion (stairs, showering, chores; she seems to tolerate walking at slower pace). No daily cough, no mucous production but she does have bronchitis about 2x a year. Has had steroids for pain in the past. She was started on pulmicort a few yrs ago during a flare, was just on it during that time. She was recently given both Pulmicort and ProAir. She has gained about 40 lbs over last 4 years.                             On pulmicort and albuterol    Review of Systems  Constitutional: Positive for unexpected weight change. Negative for fever.  HENT: Positive for trouble swallowing. Negative for congestion, dental problem, ear pain, nosebleeds, postnasal drip, rhinorrhea, sinus pressure, sneezing and sore throat.   Eyes: Negative for redness and itching.  Respiratory: Positive for shortness of breath. Negative for cough, chest tightness and wheezing.   Cardiovascular: Negative for palpitations and leg swelling.  Gastrointestinal: Positive for abdominal distention. Negative for nausea and vomiting.  Genitourinary: Negative for dysuria.  Musculoskeletal: Negative for joint swelling.  Skin: Negative for rash.  Neurological: Positive for headaches.  Hematological: Bruises/bleeds easily.  Psychiatric/Behavioral: Negative for dysphoric mood. The patient is not nervous/anxious.    Past Medical History  Diagnosis Date  . Hypothyroidism     goiter  . Factor V deficiency   . GERD (gastroesophageal reflux disease)   . Arthritis     deg disc disease  - lower back, neck  . Neuromuscular disorder     sciatic nerve  . Fibromyalgia   . SVD (spontaneous vaginal delivery)     x 2  . Cancer     left breast  surgery-lumpectomy  . PONV (postoperative nausea and vomiting)   . Hyperlipidemia      Family History  Problem Relation Age of Onset  . Heart disease Father   . Heart disease Maternal Grandfather   . Heart attack Father   . Clotting disorder Father   . Breast cancer Mother   . Lung cancer Maternal Grandfather   . Liver cancer Maternal Grandmother   . Rheumatologic disease Father      History   Social History  . Marital Status: Married    Spouse Name: N/A    Number of Children: N/A  . Years of Education: N/A   Occupational History  . Not on file.   Social History Main Topics  . Smoking status: Former Smoker -- 3.00 packs/day for 22 years    Types: Cigarettes    Quit date: 02/18/2000  . Smokeless tobacco: Never Used  . Alcohol Use: Yes     Comment: socially  . Drug Use: No  . Sexual Activity: Yes    Birth Control/ Protection: Post-menopausal   Other Topics Concern  . Not on file   Social History Narrative  . No narrative on file     Allergies  Allergen Reactions  . Etodolac     REACTION: dyspnea/tightness in chest  . Sulfonamide Derivatives     REACTION: vomiting     Outpatient Prescriptions Prior to Visit  Medication Sig Dispense  Refill  . acetaminophen (TYLENOL) 325 MG tablet Take 650 mg by mouth every 6 (six) hours as needed. Headache/pain      . esomeprazole (NEXIUM) 40 MG capsule Take 1 capsule (40 mg total) by mouth daily.  90 capsule  0  . fish oil-omega-3 fatty acids 1000 MG capsule Take 3 g by mouth daily.      Marland Kitchen levothyroxine (SYNTHROID, LEVOTHROID) 50 MCG tablet Take 50 mcg by mouth daily.      . mirabegron ER (MYRBETRIQ) 50 MG TB24 Take 50 mg by mouth daily.      . mupirocin ointment (BACTROBAN) 2 % Apply 1 application topically as needed. For facial acne      . nitrofurantoin, macrocrystal-monohydrate, (MACROBID) 100 MG capsule Take 100 mg by mouth as needed. After intercourse for UTI      . NON FORMULARY Apply 1 application topically as  needed. CDBCL- Diclofenac 3%, Baclofen 2%, Cyclobenzaprine 2%, Lidocaine 2%      . simvastatin (ZOCOR) 20 MG tablet Take 20 mg by mouth daily.      . Testosterone Propionate 2 % OINT Place 1 application onto the skin every other day.      . traMADol (ULTRAM) 50 MG tablet Take 100 mg by mouth 3 (three) times daily.      . COLLAGEN PO Take 1 tablet by mouth daily. Collagen Booster Earth Fare       No facility-administered medications prior to visit.         Objective:   Physical Exam Filed Vitals:   02/02/13 1408  BP: 150/88  Pulse: 92  Height: 5' 6.5" (1.689 m)  Weight: 196 lb 9.6 oz (89.177 kg)  SpO2: 98%   Gen: Pleasant, slightly overwt, in no distress,  normal affect  ENT: No lesions,  mouth clear,  oropharynx clear, no postnasal drip  Neck: No JVD, no TMG, no carotid bruits  Lungs: No use of accessory muscles, clear without rales or rhonchi  Cardiovascular: RRR, heart sounds normal, no murmur or gallops, no peripheral edema  Musculoskeletal: No deformities, no cyanosis or clubbing  Neuro: alert, non focal  Skin: Warm, no lesions or rashes       Assessment & Plan:  Dyspnea Her PFT are reassuring. Suspect she does have some degree of mild COPD given her episodes of recurrent bronchitis in the spring and the fall. A large contributor at this point  to her dyspnea is probably restrictive lung disease from weight gain and deconditioning due to her to her inactivity from her chronic pain. Discussed ways with her to try to slowly rebuild her cardiopulmonary conditioning  Restrictive lung disease Due to wt gain  COPD (chronic obstructive pulmonary disease) Spirometry reassuring. Will defer any scheduled bronchodilators at this time. Stop her scheduled Pulmicort. Continue albuterol when necessary

## 2013-02-02 NOTE — Patient Instructions (Signed)
Your lung function testing shows that you do not have a large airflow deficit from smoking such as COPD or emphysema at this time. For this reason we can try to stop Pulmicort. Please keep your albuterol (ProAir) available to use if needed for wheezing or shortness of breath. You may want to pre-treat exercise to maximize your lung function.  A component of your breathing problems, especially your bronchitis, probably relates to mild COPD. We may need to change your inhaled medications or other meds during the Spring and Fall Continue to work hard on your exercise, even light exercise, to build up your cardiopulmonary conditioning. This will help with your exertional tolerance and day-to-day activities.  Follow with Tracey Rodriguez in 4 months or sooner if you have any problems.

## 2013-02-02 NOTE — Assessment & Plan Note (Signed)
Her PFT are reassuring. Suspect she does have some degree of mild COPD given her episodes of recurrent bronchitis in the spring and the fall. A large contributor at this point  to her dyspnea is probably restrictive lung disease from weight gain and deconditioning due to her to her inactivity from her chronic pain. Discussed ways with her to try to slowly rebuild her cardiopulmonary conditioning

## 2013-02-02 NOTE — Assessment & Plan Note (Signed)
Due to wt gain

## 2013-02-02 NOTE — Assessment & Plan Note (Signed)
Spirometry reassuring. Will defer any scheduled bronchodilators at this time. Stop her scheduled Pulmicort. Continue albuterol when necessary

## 2013-03-27 ENCOUNTER — Emergency Department (HOSPITAL_COMMUNITY): Payer: 59

## 2013-03-27 ENCOUNTER — Emergency Department (HOSPITAL_COMMUNITY)
Admission: EM | Admit: 2013-03-27 | Discharge: 2013-03-27 | Disposition: A | Discharge status: Home / Self Care | Payer: 59 | Attending: Emergency Medicine | Admitting: Emergency Medicine

## 2013-03-27 ENCOUNTER — Encounter (HOSPITAL_COMMUNITY): Payer: Self-pay | Admitting: Emergency Medicine

## 2013-03-27 DIAGNOSIS — E785 Hyperlipidemia, unspecified: Secondary | ICD-10-CM | POA: Insufficient documentation

## 2013-03-27 DIAGNOSIS — R112 Nausea with vomiting, unspecified: Secondary | ICD-10-CM | POA: Insufficient documentation

## 2013-03-27 DIAGNOSIS — E039 Hypothyroidism, unspecified: Secondary | ICD-10-CM | POA: Insufficient documentation

## 2013-03-27 DIAGNOSIS — Z9889 Other specified postprocedural states: Secondary | ICD-10-CM | POA: Insufficient documentation

## 2013-03-27 DIAGNOSIS — IMO0002 Reserved for concepts with insufficient information to code with codable children: Secondary | ICD-10-CM | POA: Insufficient documentation

## 2013-03-27 DIAGNOSIS — R109 Unspecified abdominal pain: Secondary | ICD-10-CM

## 2013-03-27 DIAGNOSIS — G709 Myoneural disorder, unspecified: Secondary | ICD-10-CM | POA: Insufficient documentation

## 2013-03-27 DIAGNOSIS — IMO0001 Reserved for inherently not codable concepts without codable children: Secondary | ICD-10-CM | POA: Insufficient documentation

## 2013-03-27 DIAGNOSIS — Z3202 Encounter for pregnancy test, result negative: Secondary | ICD-10-CM | POA: Insufficient documentation

## 2013-03-27 DIAGNOSIS — Z87891 Personal history of nicotine dependence: Secondary | ICD-10-CM | POA: Insufficient documentation

## 2013-03-27 DIAGNOSIS — Z9089 Acquired absence of other organs: Secondary | ICD-10-CM | POA: Insufficient documentation

## 2013-03-27 DIAGNOSIS — R5381 Other malaise: Secondary | ICD-10-CM | POA: Insufficient documentation

## 2013-03-27 DIAGNOSIS — Z853 Personal history of malignant neoplasm of breast: Secondary | ICD-10-CM | POA: Insufficient documentation

## 2013-03-27 DIAGNOSIS — D72829 Elevated white blood cell count, unspecified: Secondary | ICD-10-CM | POA: Insufficient documentation

## 2013-03-27 DIAGNOSIS — R5383 Other fatigue: Secondary | ICD-10-CM

## 2013-03-27 DIAGNOSIS — K219 Gastro-esophageal reflux disease without esophagitis: Secondary | ICD-10-CM | POA: Insufficient documentation

## 2013-03-27 DIAGNOSIS — Z79899 Other long term (current) drug therapy: Secondary | ICD-10-CM | POA: Insufficient documentation

## 2013-03-27 DIAGNOSIS — R509 Fever, unspecified: Secondary | ICD-10-CM | POA: Insufficient documentation

## 2013-03-27 DIAGNOSIS — Z8619 Personal history of other infectious and parasitic diseases: Secondary | ICD-10-CM | POA: Insufficient documentation

## 2013-03-27 DIAGNOSIS — R1011 Right upper quadrant pain: Secondary | ICD-10-CM | POA: Insufficient documentation

## 2013-03-27 DIAGNOSIS — R1013 Epigastric pain: Secondary | ICD-10-CM | POA: Insufficient documentation

## 2013-03-27 DIAGNOSIS — R630 Anorexia: Secondary | ICD-10-CM | POA: Insufficient documentation

## 2013-03-27 DIAGNOSIS — M129 Arthropathy, unspecified: Secondary | ICD-10-CM | POA: Insufficient documentation

## 2013-03-27 LAB — CBC WITH DIFFERENTIAL/PLATELET
BASOS ABS: 0 10*3/uL (ref 0.0–0.1)
BASOS PCT: 0 % (ref 0–1)
EOS ABS: 0.1 10*3/uL (ref 0.0–0.7)
Eosinophils Relative: 0 % (ref 0–5)
HCT: 41.3 % (ref 36.0–46.0)
HEMOGLOBIN: 14.7 g/dL (ref 12.0–15.0)
Lymphocytes Relative: 13 % (ref 12–46)
Lymphs Abs: 2.1 10*3/uL (ref 0.7–4.0)
MCH: 30.3 pg (ref 26.0–34.0)
MCHC: 35.6 g/dL (ref 30.0–36.0)
MCV: 85.2 fL (ref 78.0–100.0)
MONOS PCT: 5 % (ref 3–12)
Monocytes Absolute: 0.9 10*3/uL (ref 0.1–1.0)
NEUTROS PCT: 81 % — AB (ref 43–77)
Neutro Abs: 13.1 10*3/uL — ABNORMAL HIGH (ref 1.7–7.7)
Platelets: 330 10*3/uL (ref 150–400)
RBC: 4.85 MIL/uL (ref 3.87–5.11)
RDW: 12.6 % (ref 11.5–15.5)
WBC: 16.2 10*3/uL — ABNORMAL HIGH (ref 4.0–10.5)

## 2013-03-27 LAB — COMPREHENSIVE METABOLIC PANEL
ALBUMIN: 4.4 g/dL (ref 3.5–5.2)
ALT: 25 U/L (ref 0–35)
AST: 21 U/L (ref 0–37)
Alkaline Phosphatase: 72 U/L (ref 39–117)
BUN: 18 mg/dL (ref 6–23)
CO2: 26 mEq/L (ref 19–32)
Calcium: 9.7 mg/dL (ref 8.4–10.5)
Chloride: 96 mEq/L (ref 96–112)
Creatinine, Ser: 0.88 mg/dL (ref 0.50–1.10)
GFR calc Af Amer: 86 mL/min — ABNORMAL LOW (ref >90)
GFR calc non Af Amer: 74 mL/min — ABNORMAL LOW (ref >90)
Glucose, Bld: 139 mg/dL — ABNORMAL HIGH (ref 70–99)
POTASSIUM: 3.9 meq/L (ref 3.7–5.3)
SODIUM: 138 meq/L (ref 137–147)
TOTAL PROTEIN: 8.8 g/dL — AB (ref 6.0–8.3)
Total Bilirubin: 0.3 mg/dL (ref 0.3–1.2)

## 2013-03-27 LAB — POCT I-STAT TROPONIN I: Troponin i, poc: 0 ng/mL (ref 0.00–0.08)

## 2013-03-27 LAB — URINALYSIS, ROUTINE W REFLEX MICROSCOPIC
BILIRUBIN URINE: NEGATIVE
GLUCOSE, UA: NEGATIVE mg/dL
Hgb urine dipstick: NEGATIVE
Ketones, ur: NEGATIVE mg/dL
LEUKOCYTES UA: NEGATIVE
NITRITE: NEGATIVE
PH: 8 (ref 5.0–8.0)
Protein, ur: NEGATIVE mg/dL
SPECIFIC GRAVITY, URINE: 1.039 — AB (ref 1.005–1.030)
Urobilinogen, UA: 0.2 mg/dL (ref 0.0–1.0)

## 2013-03-27 LAB — LIPASE, BLOOD: Lipase: 22 U/L (ref 11–59)

## 2013-03-27 LAB — PREGNANCY, URINE: PREG TEST UR: NEGATIVE

## 2013-03-27 MED ORDER — ONDANSETRON 8 MG PO TBDP: 8.0000 mg | ORAL_TABLET | Freq: Three times a day (TID) | ORAL | Status: DC | PRN

## 2013-03-27 MED ORDER — OXYCODONE-ACETAMINOPHEN 5-325 MG PO TABS: 2.0000 | ORAL_TABLET | ORAL | Status: DC | PRN

## 2013-03-27 MED ORDER — DICYCLOMINE HCL 10 MG PO CAPS
20.0000 mg | ORAL_CAPSULE | Freq: Once | ORAL | Status: AC
  Administered 2013-03-27: 20 mg via ORAL
  Filled 2013-03-27: qty 2

## 2013-03-27 MED ORDER — MORPHINE SULFATE 4 MG/ML IJ SOLN
4.0000 mg | Freq: Once | INTRAMUSCULAR | Status: AC
  Administered 2013-03-27: 4 mg via INTRAVENOUS
  Filled 2013-03-27: qty 1

## 2013-03-27 MED ORDER — DICYCLOMINE HCL 20 MG PO TABS
20.0000 mg | ORAL_TABLET | Freq: Four times a day (QID) | ORAL | Status: DC | PRN
Start: 2013-03-27 — End: 2015-11-12

## 2013-03-27 MED ORDER — IOHEXOL 300 MG/ML  SOLN
20.0000 mL | INTRAMUSCULAR | Status: AC
  Administered 2013-03-27: 25 mL via ORAL

## 2013-03-27 MED ORDER — IOHEXOL 300 MG/ML  SOLN
100.0000 mL | Freq: Once | INTRAMUSCULAR | Status: AC | PRN
  Administered 2013-03-27: 100 mL via INTRAVENOUS

## 2013-03-27 MED ORDER — ONDANSETRON HCL 4 MG/2ML IJ SOLN
4.0000 mg | Freq: Once | INTRAMUSCULAR | Status: AC
  Administered 2013-03-27: 4 mg via INTRAVENOUS
  Filled 2013-03-27 (×2): qty 2

## 2013-03-27 NOTE — ED Notes (Signed)
MD at bedside. 

## 2013-03-27 NOTE — ED Provider Notes (Signed)
CSN: [id number]     Arrival date & time 03/27/13  0042 History   First MD Initiated Contact with Patient 03/27/13 [phone]     Chief Complaint  Patient presents with  . Abdominal Pain   (Consider location/radiation/quality/duration/timing/severity/associated sxs/prior Treatment) HPI 53 year old female presents to emergency department with complaint of epigastric pain, right upper quadrant pain associated with nausea and vomiting.  Patient, reports she's been feeling unwell most of the day.  The abdominal pain became acutely worse at 10 PM.  Pain is described as a ripping, tearing sensation in her epigastrium.  She has some radiation of the pain into her right shoulder.  She has had 4-5 episodes of nausea and vomiting.  Patient status post appendectomy, no other abdominal surgeries.  Patient reports she's been having intermittent fevers over the last month.  Initial fever started January 10 and was associated with nausea and vomiting.  She was seen by her primary care Dr., and had blood work done.  She was told she had a bacterial infection, but it was not her gallbladder.  She was put on Cipro for 3 days.  Patient was not told the source of the bacterial infection.  Patient did well until this past Sunday, when she began to have fevers again.  Fevers were persistent through the week.  No nausea and vomiting with this episode of fever.  She was seen on Friday by her primary care Dr.  Due to some pressure in her face, she was started on amoxicillin twice a day for possible sinus infection.  She has taken 2 doses of the amoxicillin. Past Medical History  Diagnosis Date  . Hypothyroidism     goiter  . Factor V deficiency   . GERD (gastroesophageal reflux disease)   . Arthritis     deg disc disease  - lower back, neck  . Neuromuscular disorder     sciatic nerve  . Fibromyalgia   . SVD (spontaneous vaginal delivery)     x 2  . Cancer     left breast surgery-lumpectomy  . PONV (postoperative nausea and  vomiting)   . Hyperlipidemia    Past Surgical History  Procedure Laterality Date  . Ablation    . Appendectomy    . Breast surgery      left- lumpectomy  . Dilation and curettage of uterus    . Nose surgery    . Dilatation & currettage/hysteroscopy with resectocope N/A 04/05/2012    Procedure: hysteroscopy with endocervical curretting;  Surgeon: Allyn Kenner, DO;  Location: [facility];  Service: Gynecology;  Laterality: N/A;   Family History  Problem Relation Age of Onset  . Heart disease Father   . Heart disease Maternal Grandfather   . Heart attack Father   . Clotting disorder Father   . Breast cancer Mother   . Lung cancer Maternal Grandfather   . Liver cancer Maternal Grandmother   . Rheumatologic disease Father    History  Substance Use Topics  . Smoking status: Former Smoker -- 3.00 packs/day for 22 years    Types: Cigarettes    Quit date: 02/18/2000  . Smokeless tobacco: Never Used  . Alcohol Use: Yes     Comment: socially   OB History   Grav Para Term Preterm Abortions TAB SAB Ect Mult Living                 Review of Systems  Constitutional: Positive for fever, chills, activity change, appetite change and fatigue.  HENT:  Positive for sinus pressure.   Eyes: Negative.   Respiratory: Negative.   Cardiovascular: Negative.   Gastrointestinal: Positive for nausea and vomiting. Negative for diarrhea and constipation.  Endocrine: Negative.   Genitourinary: Negative.   Allergic/Immunologic: Negative.   Neurological: Negative.   Hematological: Negative.   Psychiatric/Behavioral: Negative.     Allergies  Etodolac and Sulfonamide derivatives  Home Medications   Current Outpatient Rx  Name  Route  Sig  Dispense  Refill  . acetaminophen (TYLENOL) 325 MG tablet   Oral   Take 650 mg by mouth every 6 (six) hours as needed. Headache/pain         . albuterol (PROVENTIL HFA;VENTOLIN HFA) 108 (90 BASE) MCG/ACT inhaler   Inhalation   Inhale 2 puffs into the  lungs every 6 (six) hours as needed for wheezing or shortness of breath.         . budesonide (PULMICORT) 180 MCG/ACT inhaler   Inhalation   Inhale 1 puff into the lungs 2 (two) times daily.         . COLLAGEN PO   Oral   Take 1 tablet by mouth daily. Collagen Booster Earth Fare         . esomeprazole (NEXIUM) 40 MG capsule   Oral   Take 1 capsule (40 mg total) by mouth daily.   90 capsule   0   . fish oil-omega-3 fatty acids 1000 MG capsule   Oral   Take 3 g by mouth daily.         Marland Kitchen gabapentin (NEURONTIN) 100 MG capsule   Oral   Take 100 mg by mouth 2 (two) times daily. 2 tabs at bed time         . levothyroxine (SYNTHROID, LEVOTHROID) 50 MCG tablet   Oral   Take 50 mcg by mouth daily.         . mirabegron ER (MYRBETRIQ) 50 MG TB24   Oral   Take 50 mg by mouth daily.         . mupirocin ointment (BACTROBAN) 2 %   Topical   Apply 1 application topically as needed. For facial acne         . nitrofurantoin, macrocrystal-monohydrate, (MACROBID) 100 MG capsule   Oral   Take 100 mg by mouth as needed. After intercourse for UTI         . NON FORMULARY   Topical   Apply 1 application topically as needed. CDBCL- Diclofenac 3%, Baclofen 2%, Cyclobenzaprine 2%, Lidocaine 2%         . simvastatin (ZOCOR) 20 MG tablet   Oral   Take 20 mg by mouth daily.         . Testosterone Propionate 2 % OINT   Transdermal   Place 1 application onto the skin every other day.         . traMADol (ULTRAM) 50 MG tablet   Oral   Take 100 mg by mouth 3 (three) times daily.          BP 137/82  Pulse 99  Temp(Src) 98.2 F (36.8 C) (Oral)  Resp 15  Ht 5\' 5"  (1.651 m)  Wt 193 lb 9 oz (87.799 kg)  BMI 32.21 kg/m2  SpO2 97% Physical Exam  Nursing note and vitals reviewed. Constitutional: She is oriented to person, place, and time. She appears well-developed and well-nourished.  HENT:  Head: Normocephalic and atraumatic.  Nose: Nose normal.  Mouth/Throat:  Oropharynx is clear and moist.  Eyes: Conjunctivae and EOM are normal. Pupils are equal, round, and reactive to light.  Neck: Normal range of motion. Neck supple. No JVD present. No tracheal deviation present. No thyromegaly present.  Cardiovascular: Normal rate, regular rhythm, normal heart sounds and intact distal pulses.  Exam reveals no gallop and no friction rub.   No murmur heard. Pulmonary/Chest: Effort normal and breath sounds normal. No stridor. No respiratory distress. She has no wheezes. She has no rales. She exhibits no tenderness.  Abdominal: Soft. Bowel sounds are normal. She exhibits no distension and no mass. There is tenderness (mild tenderness in epigastrium, and right upper quadrant). There is no rebound and no guarding.  Musculoskeletal: Normal range of motion. She exhibits no edema and no tenderness.  Lymphadenopathy:    She has no cervical adenopathy.  Neurological: She is alert and oriented to person, place, and time. She exhibits normal muscle tone. Coordination normal.  Skin: Skin is dry. No rash noted. No erythema. No pallor.  Psychiatric: She has a normal mood and affect. Her behavior is normal. Judgment and thought content normal.    ED Course  Procedures (including critical care time) Labs Review Labs Reviewed  CBC WITH DIFFERENTIAL - Abnormal; Notable for the following:    WBC 16.2 (*)    Neutrophils Relative % 81 (*)    Neutro Abs 13.1 (*)    All other components within normal limits  COMPREHENSIVE METABOLIC PANEL - Abnormal; Notable for the following:    Glucose, Bld 139 (*)    Total Protein 8.8 (*)    GFR calc non Af Amer 74 (*)    GFR calc Af Amer 86 (*)    All other components within normal limits  URINALYSIS, ROUTINE W REFLEX MICROSCOPIC - Abnormal; Notable for the following:    APPearance CLOUDY (*)    Specific Gravity, Urine 1.039 (*)    All other components within normal limits  LIPASE, BLOOD  PREGNANCY, URINE  POCT I-STAT TROPONIN I    Imaging Review US Abdomen Complete  03/27/2013   CLINICAL DATA:  Right upper quadrant pain and intermittent fevers for 1 month. Concern for cholecystitis.  EXAM: ULTRASOUND ABDOMEN COMPLETE  COMPARISON:  None.  FINDINGS: Gallbladder:  No gallstones or wall thickening visualized. No sonographic Murphy sign noted.  Common bile duct:  Diameter: 3 mm  Liver:  Mildly heterogeneous echotexture without focal lesion identified.  IVC:  Infrahepatic IVC not well visualized.  Pancreas:  Not visualized due to bowel gas.  Spleen:  Size and appearance within normal limits.  Right Kidney:  Length: 10.8 cm. Echogenicity within normal limits. No mass or hydronephrosis visualized.  Left Kidney:  Length: 10.4 cm. Echogenicity within normal limits. No mass or hydronephrosis visualized.  Abdominal aorta:  No aneurysm visualized, although much of the abdominal aorta was obscured.  Other findings:  None.  IMPRESSION: 1. Unremarkable appearance of the gallbladder. No evidence of cholecystitis or biliary dilatation. 2. Mildly heterogeneous liver echotexture without focal lesion seen.   Electronically Signed   By: Logan Bores   On: 03/27/2013 02:57   Ct Abdomen Pelvis W Contrast  03/27/2013   CLINICAL DATA:  Upper abdominal pain. Intermittent fever for 1 month. Nausea and vomiting with elevated white blood cell count.  EXAM: CT ABDOMEN AND PELVIS WITH CONTRAST  TECHNIQUE: Multidetector CT imaging of the abdomen and pelvis was performed using the standard protocol following bolus administration of intravenous contrast.  CONTRAST:  119mL OMNIPAQUE IOHEXOL 300 MG/ML  SOLN  COMPARISON:  Abdominal ultrasound 03/27/2013  FINDINGS: The visualized lung bases are clear.  Diffusely low attenuation of the liver is suggestive of mild steatosis. No focal liver lesion is identified. The gallbladder, spleen, adrenal glands, kidneys, and pancreas are unremarkable.  Oral contrast is present within stomach and proximal small bowel. No dilated loops of  bowel are seen to suggest obstruction. No bowel wall thickening is identified. No inflammatory changes are identified within the mesentery. The appendix is not definitely identified. The uterus and ovaries are identified and grossly unremarkable. Bladder is unremarkable. No free fluid or enlarged lymph nodes are identified.  Mild atherosclerotic calcification of the abdominal aorta is noted. Bilateral pars defects are present at L5 and there is grade 1 anterolisthesis of L5 on S1 with associated severe disc space narrowing, vacuum disc, and endplate sclerosis.  IMPRESSION: 1. No acute abnormality identified in the abdomen or pelvis. 2. Mild hepatic steatosis. 3. Bilateral L5 pars defects with grade 1 anterolisthesis.   Electronically Signed   By: Logan Bores   On: 03/27/2013 05:39    EKG Interpretation    Date/Time:  Sunday March 27 2013 00:48:51 EST Ventricular Rate:  101 PR Interval:  164 QRS Duration: 80 QT Interval:  342 QTC Calculation: 443 R Axis:   45 Text Interpretation:  Sinus tachycardia Septal infarct , age undetermined Abnormal ECG No old tracing to compare Confirmed by Tedford Berg  MD, Huzaifa Viney ([id number]) on 03/27/2013 1:01:24 AM            MDM   1. Abdominal pain   2. Nausea and vomiting   3. Leukocytosis    53 year old female with intermittent fevers over the last month.  Although she was told that she did not have gallbladder problems.  Based on her blood work, I feel that given the location of her pain and radiation of her pain.  She should have an ultrasound to evaluate for possible cholecystitis.  Will treat with morphine and Zofran for pain and nausea.  Will get blood work.  If ultrasound is unremarkable, I feel patient may need CT scan for possible intra-abdominal process causing her ongoing fevers.  7:23 AM Other than leukocytosis, no other significant findings.  Will refer patient back to her gastroenterologist, and her primary care Dr.   Kalman Drape, MD 03/27/13 [phone]

## 2013-03-27 NOTE — Discharge Instructions (Signed)
Your workup today has not shown a specific cause for your symptoms.  No signs of gallbladder disease, or other infection, inflammation were noted on your ultrasound or CT scans.  Continue taking your medications as prescribed.  You have been prescribed medication for abdominal cramping and nausea as well.  Stick to a very bland diet until feeling better.  Return emergency room for worsening condition or new concerning symptoms.   Abdominal Pain, Adult Many things can cause abdominal pain. Usually, abdominal pain is not caused by a disease and will improve without treatment. It can often be observed and treated at home. Your health care provider will do a physical exam and possibly order blood tests and X-rays to help determine the seriousness of your pain. However, in many cases, more time must pass before a clear cause of the pain can be found. Before that point, your health care provider may not know if you need more testing or further treatment. HOME CARE INSTRUCTIONS  Monitor your abdominal pain for any changes. The following actions may help to alleviate any discomfort you are experiencing:  Only take over-the-counter or prescription medicines as directed by your health care provider.  Do not take laxatives unless directed to do so by your health care provider.  Try a clear liquid diet (broth, tea, or water) as directed by your health care provider. Slowly move to a bland diet as tolerated. SEEK MEDICAL CARE IF:  You have unexplained abdominal pain.  You have abdominal pain associated with nausea or diarrhea.  You have pain when you urinate or have a bowel movement.  You experience abdominal pain that wakes you in the night.  You have abdominal pain that is worsened or improved by eating food.  You have abdominal pain that is worsened with eating fatty foods. SEEK IMMEDIATE MEDICAL CARE IF:   Your pain does not go away within 2 hours.  You have a fever.  You keep throwing up  (vomiting).  Your pain is felt only in portions of the abdomen, such as the right side or the left lower portion of the abdomen.  You pass bloody or black tarry stools. MAKE SURE YOU:  Understand these instructions.   Will watch your condition.   Will get help right away if you are not doing well or get worse.  Document Released: 11/13/2004 Document Revised: 11/24/2012 Document Reviewed: 10/13/2012 [facility] Patient Information 2014 [place].  Leukocytosis Leukocytosis means you have more white blood cells than normal. White blood cells are made in your bone marrow. The main job of white blood cells is to fight infection. Having too many white blood cells is a common condition. It can develop as a result of many types of medical problems. CAUSES  In some cases, your bone marrow may be normal, but it is still making too many white blood cells. This could be the result of:  Infection.  Injury.  Physical stress.  Emotional stress.  Surgery.  Allergic reactions.  Tumors that do not start in the blood or bone marrow.  An inherited disease.  Certain medicines.  Pregnancy and labor. In other cases, you may have a bone marrow disorder that is causing your body to make too many white blood cells. Bone marrow disorders include:  Leukemia. This is a type of blood cancer.  Myeloproliferative disorders. These disorders cause blood cells to grow abnormally. SYMPTOMS  Some people have no symptoms. Others have symptoms due to the medical problem that is causing their leukocytosis.  These symptoms may include:  Bleeding.  Bruising.  Fever.  Night sweats.  Repeated infections.  Weakness.  Weight loss. DIAGNOSIS  Leukocytosis is often found during blood tests that are done as part of a normal physical exam. Your caregiver will probably order other tests to help determine why you have too many white blood cells. These tests may include:  A complete blood count  (CBC). This test measures all the types of blood cells in your body.  Chest X-rays, urine tests (urinalysis), or other tests to look for signs of infection.  Bone marrow aspiration. For this test, a needle is put into your bone. Cells from the bone marrow are removed through the needle. The cells are then examined under a microscope. TREATMENT  Treatment is usually not needed for leukocytosis. However, if a disorder is causing your leukocytosis, it will need to be treated. Treatment may include:  Antibiotic medicines if you have a bacterial infection.  Bone marrow transplant. Your diseased bone marrow is replaced with healthy cells that will grow new bone marrow.  Chemotherapy. This is the use of drugs to kill cancer cells. HOME CARE INSTRUCTIONS  Only take over-the-counter or prescription medicines as directed by your caregiver.  Maintain a healthy weight. Ask your caregiver what weight is best for you.  Eat foods that are low in saturated fats and high in fiber. Eat plenty of fruits and vegetables.  Drink enough fluids to keep your urine clear or pale yellow.  Get 30 minutes of exercise at least 5 times a week. Check with your caregiver before starting a new exercise routine.  Limit caffeine and alcohol.  Do not smoke.  Keep all follow-up appointments as directed by your caregiver. SEEK MEDICAL CARE IF:  You feel weak or more tired than usual.  You develop chills, a cough, or nasal congestion.  You lose weight without trying.  You have night sweats.  You bruise easily. SEEK IMMEDIATE MEDICAL CARE IF:  You bleed more than normal.  You have chest pain.  You have trouble breathing.  You have a fever.  You have uncontrolled nausea or vomiting.  You feel dizzy or lightheaded. MAKE SURE YOU:  Understand these instructions.  Will watch your condition.  Will get help right away if you are not doing well or get worse. Document Released: 01/23/2011 Document  Revised: 04/28/2011 Document Reviewed: 01/23/2011 [facility] Patient Information 2014 [place], [place].  Nausea and Vomiting Nausea is a sick feeling that often comes before throwing up (vomiting). Vomiting is a reflex where stomach contents come out of your mouth. Vomiting can cause severe loss of body fluids (dehydration). Children and elderly adults can become dehydrated quickly, especially if they also have diarrhea. Nausea and vomiting are symptoms of a condition or disease. It is important to find the cause of your symptoms. CAUSES   Direct irritation of the stomach lining. This irritation can result from increased acid production (gastroesophageal reflux disease), infection, food poisoning, taking certain medicines (such as nonsteroidal anti-inflammatory drugs), alcohol use, or tobacco use.  Signals from the brain.These signals could be caused by a headache, heat exposure, an inner ear disturbance, increased pressure in the brain from injury, infection, a tumor, or a concussion, pain, emotional stimulus, or metabolic problems.  An obstruction in the gastrointestinal tract (bowel obstruction).  Illnesses such as diabetes, hepatitis, gallbladder problems, appendicitis, kidney problems, cancer, sepsis, atypical symptoms of a heart attack, or eating disorders.  Medical treatments such as chemotherapy and radiation.  Receiving medicine  that makes you sleep (general anesthetic) during surgery. DIAGNOSIS Your caregiver may ask for tests to be done if the problems do not improve after a few days. Tests may also be done if symptoms are severe or if the reason for the nausea and vomiting is not clear. Tests may include:  Urine tests.  Blood tests.  Stool tests.  Cultures (to look for evidence of infection).  X-rays or other imaging studies. Test results can help your caregiver make decisions about treatment or the need for additional tests. TREATMENT You need to stay well hydrated. Drink  frequently but in small amounts.You may wish to drink water, sports drinks, clear broth, or eat frozen ice pops or gelatin dessert to help stay hydrated.When you eat, eating slowly may help prevent nausea.There are also some antinausea medicines that may help prevent nausea. HOME CARE INSTRUCTIONS   Take all medicine as directed by your caregiver.  If you do not have an appetite, do not force yourself to eat. However, you must continue to drink fluids.  If you have an appetite, eat a normal diet unless your caregiver tells you differently.  Eat a variety of complex carbohydrates (rice, wheat, potatoes, bread), lean meats, yogurt, fruits, and vegetables.  Avoid high-fat foods because they are more difficult to digest.  Drink enough water and fluids to keep your urine clear or pale yellow.  If you are dehydrated, ask your caregiver for specific rehydration instructions. Signs of dehydration may include:  Severe thirst.  Dry lips and mouth.  Dizziness.  Dark urine.  Decreasing urine frequency and amount.  Confusion.  Rapid breathing or pulse. SEEK IMMEDIATE MEDICAL CARE IF:   You have blood or brown flecks (like coffee grounds) in your vomit.  You have black or bloody stools.  You have a severe headache or stiff neck.  You are confused.  You have severe abdominal pain.  You have chest pain or trouble breathing.  You do not urinate at least once every 8 hours.  You develop cold or clammy skin.  You continue to vomit for longer than 24 to 48 hours.  You have a fever. MAKE SURE YOU:   Understand these instructions.  Will watch your condition.  Will get help right away if you are not doing well or get worse. Document Released: 02/03/2005 Document Revised: 04/28/2011 Document Reviewed: 07/03/2010 [facility] Patient Information 2014 [place], [place].

## 2013-03-27 NOTE — ED Notes (Addendum)
Epigastric pain, radiating to back between shoulder blades. Some n/v, chills. Went to pcp 3 weeks vomiting, fever. R/o no gall bladder issues.

## 2013-03-27 NOTE — ED Notes (Signed)
Patient finished drinking contrast solution.

## 2013-03-28 ENCOUNTER — Telehealth: Payer: Self-pay | Admitting: Internal Medicine

## 2013-03-28 NOTE — Telephone Encounter (Signed)
Patient was seen in the ER for abdominal and epigastric pain.  She had a negative CT and [place] and was advised to schedule a follow up with Dr. Carlean Purl.  She will come in tomorrow and see Amy Esterwood PA at 1:30

## 2013-03-29 ENCOUNTER — Other Ambulatory Visit (INDEPENDENT_AMBULATORY_CARE_PROVIDER_SITE_OTHER): Payer: 59

## 2013-03-29 ENCOUNTER — Ambulatory Visit (INDEPENDENT_AMBULATORY_CARE_PROVIDER_SITE_OTHER): Payer: 59 | Admitting: Physician Assistant

## 2013-03-29 ENCOUNTER — Encounter: Payer: Self-pay | Admitting: Physician Assistant

## 2013-03-29 VITALS — BP 130/86 | HR 83 | Ht 66.0 in | Wt 190.0 lb

## 2013-03-29 DIAGNOSIS — R1013 Epigastric pain: Secondary | ICD-10-CM

## 2013-03-29 DIAGNOSIS — R112 Nausea with vomiting, unspecified: Secondary | ICD-10-CM

## 2013-03-29 DIAGNOSIS — IMO0001 Reserved for inherently not codable concepts without codable children: Secondary | ICD-10-CM

## 2013-03-29 DIAGNOSIS — M797 Fibromyalgia: Secondary | ICD-10-CM

## 2013-03-29 DIAGNOSIS — G8929 Other chronic pain: Secondary | ICD-10-CM

## 2013-03-29 DIAGNOSIS — E039 Hypothyroidism, unspecified: Secondary | ICD-10-CM

## 2013-03-29 DIAGNOSIS — C50919 Malignant neoplasm of unspecified site of unspecified female breast: Secondary | ICD-10-CM

## 2013-03-29 DIAGNOSIS — R1011 Right upper quadrant pain: Secondary | ICD-10-CM

## 2013-03-29 DIAGNOSIS — D682 Hereditary deficiency of other clotting factors: Secondary | ICD-10-CM

## 2013-03-29 LAB — CBC WITH DIFFERENTIAL/PLATELET
Basophils Absolute: 0.1 10*3/uL (ref 0.0–0.1)
Basophils Relative: 0.9 % (ref 0.0–3.0)
EOS ABS: 0.1 10*3/uL (ref 0.0–0.7)
EOS PCT: 0.8 % (ref 0.0–5.0)
HEMATOCRIT: 42.3 % (ref 36.0–46.0)
Hemoglobin: 14.2 g/dL (ref 12.0–15.0)
Lymphocytes Relative: 28.4 % (ref 12.0–46.0)
Lymphs Abs: 2.6 10*3/uL (ref 0.7–4.0)
MCHC: 33.5 g/dL (ref 30.0–36.0)
MCV: 87.6 fl (ref 78.0–100.0)
MONO ABS: 0.8 10*3/uL (ref 0.1–1.0)
Monocytes Relative: 9.1 % (ref 3.0–12.0)
Neutro Abs: 5.6 10*3/uL (ref 1.4–7.7)
Neutrophils Relative %: 60.8 % (ref 43.0–77.0)
PLATELETS: 288 10*3/uL (ref 150.0–400.0)
RBC: 4.83 Mil/uL (ref 3.87–5.11)
RDW: 12.9 % (ref 11.5–14.6)
WBC: 9.1 10*3/uL (ref 4.5–10.5)

## 2013-03-29 LAB — HIGH SENSITIVITY CRP: CRP, High Sensitivity: 3.23 mg/L (ref 0.000–5.000)

## 2013-03-29 MED ORDER — SACCHAROMYCES BOULARDII 250 MG PO CAPS: 250.0000 mg | ORAL_CAPSULE | Freq: Two times a day (BID) | ORAL | Status: DC

## 2013-03-29 MED ORDER — METRONIDAZOLE 250 MG PO TABS: 250.0000 mg | ORAL_TABLET | Freq: Four times a day (QID) | ORAL | Status: AC

## 2013-03-29 NOTE — Patient Instructions (Signed)
Please go to the basement level to have your labs drawn and stool study. We scheduled the Hidascan ( shows function of the gallbladder) at [facility] . 1st floor. Go to registration. Take Zofran as needed and also Nexium once daily. We sent prescriptions to [facility] 1. Flagyl 2. FLorastor probiotic.

## 2013-03-30 ENCOUNTER — Encounter: Payer: Self-pay | Admitting: Physician Assistant

## 2013-03-30 ENCOUNTER — Other Ambulatory Visit: Payer: 59

## 2013-03-30 ENCOUNTER — Telehealth: Payer: Self-pay | Admitting: Physician Assistant

## 2013-03-30 ENCOUNTER — Other Ambulatory Visit: Payer: Self-pay | Admitting: *Deleted

## 2013-03-30 DIAGNOSIS — D682 Hereditary deficiency of other clotting factors: Secondary | ICD-10-CM | POA: Insufficient documentation

## 2013-03-30 DIAGNOSIS — R197 Diarrhea, unspecified: Secondary | ICD-10-CM

## 2013-03-30 DIAGNOSIS — E039 Hypothyroidism, unspecified: Secondary | ICD-10-CM | POA: Insufficient documentation

## 2013-03-30 DIAGNOSIS — C50919 Malignant neoplasm of unspecified site of unspecified female breast: Secondary | ICD-10-CM | POA: Insufficient documentation

## 2013-03-30 DIAGNOSIS — M797 Fibromyalgia: Secondary | ICD-10-CM | POA: Insufficient documentation

## 2013-03-30 NOTE — Progress Notes (Signed)
Subjective:    Patient ID: Tracey Rodriguez, female    DOB: 1960-05-05, 53 y.o.   MRN: [id number]  HPI  Tracey Rodriguez is a pleasant 53 year old white female known to Dr. Carlean Purl from colonoscopy done in 2010. This was done for screening and Hemoccult-positive stool. This was a normal colonoscopy. She comes in today he'll over the past 3 weeks. She had an ER visit on 03/27/2013 with complaints of right upper quadrant pain epigastric pain nausea and vomiting. Her pain had acutely worsened that day and after several episodes of nausea and vomiting she went on to the emergency room. She also reported intermittent fevers over the past couple of weeks. She is status post appendectomy.  Prior to that ER visit she had been to her primary care physician's was noted to have an elevated white count and was put on a short course of Cipro for "infection" but was not told the source of her infection. She was then seen by her primary care and on 03/25/2013 because she is dull developed some pressure in her maxillary sinuses and was told she may have sinusitis and was started on amoxicillin. The time of the ER visit WBC was 16.2, other labs unremarkable. Upper abdominal ultrasound was done and was negative. CT of the abdomen and pelvis showed no acute abnormality in the abdomen or pelvis there was mild hepatic steatosis. She was treated with supportive management and discharged home with Zofran and dicyclomine. She describes her epigastric pain as a twisting cramping pulling type of pain which is fairly intense and associated with nausea, vomiting. She has not had any diarrhea. Now over the past 2 days she has not had any further fever. She has had some mild headaches and feels sore in her epigastrium but has not had any further intense episodes of pain. She has been taking Nexium 40 mg by mouth daily and using the dicyclomine and Zofran as needed. She is continued on amoxicillin. Patient is concerned because she had had a  trip to the [place] in December and about 3 weeks later some other friends who had been on a trip were also ill with fevers aches and and some GI symptoms. They are concerned about possibility of an infection picked up at that time. She has chronic back problems and fibromyalgia but is not on any regular aspirin or NSAIDs.    Review of Systems  Constitutional: Positive for appetite change and fatigue.  HENT: Negative.   Eyes: Negative.   Respiratory: Negative.   Cardiovascular: Negative.   Gastrointestinal: Positive for nausea, vomiting and abdominal pain. Negative for diarrhea.  Endocrine: Negative.   Genitourinary: Negative.   Musculoskeletal: Positive for arthralgias and back pain.  Skin: Negative.   Allergic/Immunologic: Negative.   Neurological: Negative.   Hematological: Negative.   Psychiatric/Behavioral: Negative.    Outpatient Prescriptions Prior to Visit  Medication Sig Dispense Refill  . albuterol (PROVENTIL HFA;VENTOLIN HFA) 108 (90 BASE) MCG/ACT inhaler Inhale 2 puffs into the lungs every 6 (six) hours as needed for wheezing or shortness of breath.      Marland Kitchen amoxicillin (AMOXIL) 875 MG tablet Take 875 mg by mouth 2 (two) times daily. For bacterial infection, 14 day treatment      . dicyclomine (BENTYL) 20 MG tablet Take 1 tablet (20 mg total) by mouth every 6 (six) hours as needed for spasms (for abdominal cramping).  20 tablet  0  . esomeprazole (NEXIUM) 40 MG capsule Take 1 capsule (40 mg total) by mouth  daily.  90 capsule  0  . furosemide (LASIX) 20 MG tablet Take 20 mg by mouth as needed for edema (for swelling associated with flying).       . gabapentin (NEURONTIN) 100 MG capsule Take 300 mg by mouth at bedtime. 2 tabs at bed time      . levothyroxine (SYNTHROID, LEVOTHROID) 50 MCG tablet Take 50 mcg by mouth daily.      . mirabegron ER (MYRBETRIQ) 50 MG TB24 Take 50 mg by mouth daily.      . ondansetron (ZOFRAN ODT) 8 MG disintegrating tablet Take 1 tablet (8 mg  total) by mouth every 8 (eight) hours as needed for nausea or vomiting.  20 tablet  0  . oxyCODONE-acetaminophen (PERCOCET/ROXICET) 5-325 MG per tablet Take 2 tablets by mouth every 4 (four) hours as needed for severe pain.  20 tablet  0  . simvastatin (ZOCOR) 20 MG tablet Take 20 mg by mouth daily.      . traMADol (ULTRAM) 50 MG tablet Take 100 mg by mouth 3 (three) times daily as needed (usually takes in the morning.  will take another dose in the afternoon if needed).        No facility-administered medications prior to visit.   Allergies  Allergen Reactions  . Etodolac Shortness Of Breath    REACTION: dyspnea/tightness in chest  . Pepto-Bismol [Bismuth] Nausea And Vomiting  . Pork-Derived Products Nausea And Vomiting  . Sulfonamide Derivatives Nausea And Vomiting    REACTION: vomiting   Patient Active Problem List   Diagnosis Date Noted  . Unspecified hypothyroidism 03/30/2013  . Fibromyalgia 03/30/2013  . Breast cancer 03/30/2013  . 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 Dyspnea 02/02/2013  . COPD (chronic obstructive pulmonary disease) 02/02/2013  . Restrictive lung disease 02/02/2013      History  Substance Use Topics  . Smoking status: Former Smoker -- 3.00 packs/day for 22 years    Types: Cigarettes    Quit date: 02/18/2000  . Smokeless tobacco: Never Used  . Alcohol Use: Yes     Comment: socially   family history includes Breast cancer in her mother; Clotting disorder in her father; Heart attack in her father; Heart disease in her father and maternal grandfather; Liver cancer in her maternal grandmother; Lung cancer in her maternal grandfather; Rheumatologic disease in her father.  Objective:   Physical Exam well-developed white female in no acute distress, accompanied by her husband blood pressure 130/6 pulse 83 height 5 foot 6 weight 190. HEENT; nontraumatic normocephalic EOMI PERRLA sclera anicteric, Supple no JVD, Cardiovascular; regular rate and rhythm with S1-S2 no  murmur or gallop, Pulm;clear bilaterally, Abdomen; is soft is mild rather generalized tenderness more marked in the epigastrium no guarding or rebound no palpable mass or hepatosplenomegaly bowel sounds are present, Rectal; exam not done, ;Ext;no  Clubbing,cyanosis, or edema skin warm and dry, Psych; mood and affect appropriate        Assessment & Plan:  #59  53 year old female with 3 week history of illness with intermittent low-grade fevers epigastric discomfort with intermittent episodes of more intense epigastric cramping and pain followed by nausea and vomiting and prior leukocytosis She is status post appendectomy and recent workup in the ER with ultrasound and CT scan both unrevealing. Is currently on a course of our amoxicillin for possible sinusitis but currently has no sinus pressure or pain and says her drainage is clear Etiology of her current symptoms is not clear-symptoms are suspicious for gallbladder disease  though unable to prove with current x-rays. Will rule out biliary dyskinesia. Fortunately she has improved over the past few days. May be difficult to prove parasitic infection, consider giardiasis #2 other medical issues as outlined in past history  Plan; repeat CBC with differential today, CRP and stool pathogen panel if she has any diarrhea Stop amoxicillin Started appear course of Flagyl 250 mg by mouth 4 times daily x10 days for potential parasitic infection Continue Nexium 40 mg by mouth every morning Continue Zofran 4 mg every 6 hours when necessary for nausea Continue dicyclomine 10 mg by mouth 3-4 times daily as needed for cramping Soft bland diet Schedule [facility] HIDA scan Further plans pending results of  [facility] HIDA and course over the next 2 weeks  Plan

## 2013-03-30 NOTE — Progress Notes (Signed)
Agree with Ms. Genia Harold assessment and plan. Await HIDA results. Gatha Mayer, MD, [facility]

## 2013-03-30 NOTE — Telephone Encounter (Signed)
Spoke with Nicole Kindred at radiology scheduling and moved HIDA to [facility] on on 04/07/13 at 7:00 AM. NPO after midnight. Patient notified.

## 2013-04-03 LAB — GASTROINTESTINAL PATHOGEN PANEL PCR
C. difficile Tox A/B, PCR: NEGATIVE
CAMPYLOBACTER, PCR: NEGATIVE
Cryptosporidium, PCR: NEGATIVE
E COLI (ETEC) LT/ST, PCR: NEGATIVE
E coli (STEC) stx1/stx2, PCR: NEGATIVE
E coli 0157, PCR: NEGATIVE
GIARDIA LAMBLIA, PCR: NEGATIVE
Norovirus, PCR: NEGATIVE
Rotavirus A, PCR: NEGATIVE
Salmonella, PCR: NEGATIVE
Shigella, PCR: NEGATIVE

## 2013-04-07 ENCOUNTER — Encounter (HOSPITAL_COMMUNITY)
Admission: RE | Admit: 2013-04-07 | Discharge: 2013-04-07 | Disposition: A | Discharge status: Home / Self Care | Payer: 59 | Source: Ambulatory Visit | Attending: Physician Assistant | Admitting: Physician Assistant

## 2013-04-07 DIAGNOSIS — R1013 Epigastric pain: Secondary | ICD-10-CM | POA: Insufficient documentation

## 2013-04-07 DIAGNOSIS — R1011 Right upper quadrant pain: Secondary | ICD-10-CM | POA: Insufficient documentation

## 2013-04-07 DIAGNOSIS — G8929 Other chronic pain: Secondary | ICD-10-CM

## 2013-04-07 DIAGNOSIS — R112 Nausea with vomiting, unspecified: Secondary | ICD-10-CM | POA: Insufficient documentation

## 2013-04-07 MED ORDER — TECHNETIUM TC 99M MEBROFENIN IV KIT
5.0000 | PACK | Freq: Once | INTRAVENOUS | Status: AC | PRN
  Administered 2013-04-07: 5 via INTRAVENOUS

## 2013-04-07 MED ORDER — SINCALIDE 5 MCG IJ SOLR
INTRAMUSCULAR | Status: AC
  Administered 2013-04-07: 1.75 ug via INTRAVENOUS
  Filled 2013-04-07: qty 5

## 2013-04-07 MED ORDER — SINCALIDE 5 MCG IJ SOLR
0.0200 ug/kg | Freq: Once | INTRAMUSCULAR | Status: AC
  Administered 2013-04-07: 1.75 ug via INTRAVENOUS

## 2013-04-13 ENCOUNTER — Ambulatory Visit (HOSPITAL_COMMUNITY): Payer: 59

## 2013-05-19 ENCOUNTER — Other Ambulatory Visit: Payer: Self-pay | Admitting: Obstetrics and Gynecology

## 2013-11-13 ENCOUNTER — Encounter: Payer: Self-pay | Admitting: Internal Medicine

## 2013-12-02 ENCOUNTER — Other Ambulatory Visit: Payer: Self-pay

## 2014-02-01 ENCOUNTER — Ambulatory Visit (INDEPENDENT_AMBULATORY_CARE_PROVIDER_SITE_OTHER): Payer: 59

## 2014-02-01 ENCOUNTER — Encounter: Payer: Self-pay | Admitting: Podiatry

## 2014-02-01 ENCOUNTER — Ambulatory Visit (INDEPENDENT_AMBULATORY_CARE_PROVIDER_SITE_OTHER): Payer: 59 | Admitting: Podiatry

## 2014-02-01 VITALS — BP 161/95 | HR 73 | Resp 16 | Ht 66.0 in | Wt 179.0 lb

## 2014-02-01 DIAGNOSIS — G629 Polyneuropathy, unspecified: Secondary | ICD-10-CM

## 2014-02-01 DIAGNOSIS — M779 Enthesopathy, unspecified: Secondary | ICD-10-CM

## 2014-02-01 MED ORDER — GABAPENTIN 300 MG PO CAPS: 300.0000 mg | ORAL_CAPSULE | Freq: Three times a day (TID) | ORAL | Status: DC

## 2014-02-01 NOTE — Progress Notes (Signed)
   Subjective:    Patient ID: Tracey Rodriguez, female    DOB: 12-07-1960, 53 y.o.   MRN: [id number]  HPI Comments: The balls of both of my feet burn and hurt. The toes on both feet hurt. This has been going on for 3-4 years. Its getting worse. Standing all day will bother me. i have gotten the gel inserts.  Foot Pain Associated symptoms include coughing, fatigue, numbness and weakness.      Review of Systems  Constitutional: Positive for fatigue.  HENT: Positive for sinus pressure and sneezing.   Eyes: Positive for redness.  Respiratory: Positive for cough.   Cardiovascular: Positive for leg swelling.  Genitourinary: Positive for urgency and frequency.  Musculoskeletal: Positive for back pain.       Joint pain Difficulty walking Muscle pain   Neurological: Positive for weakness and numbness.  Hematological: Bruises/bleeds easily.       Slow to heal       Objective:   Physical Exam        Assessment & Plan:

## 2014-02-01 NOTE — Progress Notes (Signed)
Subjective:     Patient ID: Tracey Rodriguez, female   DOB: [birth year], 53 y.o.   MRN: [id number]  HPI patient states I'm developing a lot of burning in the forefoot bilateral and I do have a history of back problems and fibromyalgia. Patient states that she has trouble with ambulation and that she has trouble and time sleeping at night   Review of Systems  All other systems reviewed and are negative.      Objective:   Physical Exam  Constitutional: She is oriented to person, place, and time.  Cardiovascular: Intact distal pulses.   Musculoskeletal: Normal range of motion.  Neurological: She is oriented to person, place, and time.  Skin: Skin is warm and dry.  Nursing note and vitals reviewed.  neurovascular status was found to be intact with normal DTR reflexes and range of motion of the subtalar joint. Patient's noted to have good digital perfusion is well oriented 3 with no equinus condition noted and has mild discomfort around the lesser second third and fourth metatarsophalangeal joint upon palpation bilateral. Patient does have relatively tight skin and had a negative Tinel's sign noted     Assessment:     Possible low-grade inflammatory capsulitis forefeet bilateral versus idiopathic neuropathy versus issue associated with long-term back problems possibly causing nerve compression    Plan:     H&P and x-rays reviewed. Discussed condition at great length and went to try to increase her gabapentin to 300 mg one at night one during the day and then adding a third pill during the day if she is able to tolerate without any reactions. If the causes her any problems she will stop the increased dosage and also I would like her to clear with her family physician area I went ahead today and scanned for orthotics to try to reduce plantar pressure on her feet

## 2014-02-01 NOTE — Patient Instructions (Signed)
Start taking 2 per day for 2 weeks followed by 3 per day if tolerated well. Stop if causes drowsiness or any other adverse effects

## 2014-02-06 ENCOUNTER — Telehealth: Payer: Self-pay | Admitting: *Deleted

## 2014-02-06 NOTE — Telephone Encounter (Signed)
Pt states she was scanned for orthotic at last visit, and will not be in for 1 - 2 weeks, if billed to this years insurance she wanted them, but if billed to next year she does not want them.  Please advise.

## 2014-02-14 NOTE — Telephone Encounter (Signed)
Left message for patient to call me in the office so that I can explain orthotics are billed the day the patient is scanned.  Therefore her insurance was billed the day of her visit and that they have arrived in the office and can schedule an appt to pick them up at her convenience.  mp

## 2014-02-24 ENCOUNTER — Ambulatory Visit: Payer: 59 | Admitting: *Deleted

## 2014-02-24 DIAGNOSIS — M779 Enthesopathy, unspecified: Secondary | ICD-10-CM

## 2014-02-24 NOTE — Patient Instructions (Signed)

## 2014-02-24 NOTE — Progress Notes (Signed)
PATIENT PRESENTS FOR ORTHOTIC PICK UP

## 2014-03-01 ENCOUNTER — Other Ambulatory Visit: Payer: Self-pay | Admitting: *Deleted

## 2014-03-01 NOTE — Telephone Encounter (Signed)
[facility] sent over a refill request for a 90 day supply of Gabapentin with 4 refills.  Dr. Josephina Shih the refill.

## 2014-05-24 ENCOUNTER — Other Ambulatory Visit: Payer: Self-pay | Admitting: Obstetrics and Gynecology

## 2014-05-25 LAB — CYTOLOGY - PAP

## 2014-10-07 ENCOUNTER — Emergency Department (HOSPITAL_COMMUNITY)
Admission: EM | Admit: 2014-10-07 | Discharge: 2014-10-07 | Disposition: A | Discharge status: Home / Self Care | Payer: 59 | Attending: Emergency Medicine | Admitting: Emergency Medicine

## 2014-10-07 ENCOUNTER — Encounter (HOSPITAL_COMMUNITY): Payer: Self-pay | Admitting: Nurse Practitioner

## 2014-10-07 DIAGNOSIS — M47812 Spondylosis without myelopathy or radiculopathy, cervical region: Secondary | ICD-10-CM | POA: Diagnosis not present

## 2014-10-07 DIAGNOSIS — E785 Hyperlipidemia, unspecified: Secondary | ICD-10-CM | POA: Insufficient documentation

## 2014-10-07 DIAGNOSIS — Y288XXA Contact with other sharp object, undetermined intent, initial encounter: Secondary | ICD-10-CM | POA: Diagnosis not present

## 2014-10-07 DIAGNOSIS — Z853 Personal history of malignant neoplasm of breast: Secondary | ICD-10-CM | POA: Insufficient documentation

## 2014-10-07 DIAGNOSIS — Y929 Unspecified place or not applicable: Secondary | ICD-10-CM | POA: Insufficient documentation

## 2014-10-07 DIAGNOSIS — E039 Hypothyroidism, unspecified: Secondary | ICD-10-CM | POA: Insufficient documentation

## 2014-10-07 DIAGNOSIS — S61011A Laceration without foreign body of right thumb without damage to nail, initial encounter: Secondary | ICD-10-CM | POA: Insufficient documentation

## 2014-10-07 DIAGNOSIS — Z8669 Personal history of other diseases of the nervous system and sense organs: Secondary | ICD-10-CM | POA: Insufficient documentation

## 2014-10-07 DIAGNOSIS — IMO0002 Reserved for concepts with insufficient information to code with codable children: Secondary | ICD-10-CM

## 2014-10-07 DIAGNOSIS — Z87891 Personal history of nicotine dependence: Secondary | ICD-10-CM | POA: Insufficient documentation

## 2014-10-07 DIAGNOSIS — Y999 Unspecified external cause status: Secondary | ICD-10-CM | POA: Diagnosis not present

## 2014-10-07 DIAGNOSIS — K219 Gastro-esophageal reflux disease without esophagitis: Secondary | ICD-10-CM | POA: Diagnosis not present

## 2014-10-07 DIAGNOSIS — Z79899 Other long term (current) drug therapy: Secondary | ICD-10-CM | POA: Insufficient documentation

## 2014-10-07 DIAGNOSIS — Z792 Long term (current) use of antibiotics: Secondary | ICD-10-CM | POA: Diagnosis not present

## 2014-10-07 DIAGNOSIS — Z862 Personal history of diseases of the blood and blood-forming organs and certain disorders involving the immune mechanism: Secondary | ICD-10-CM | POA: Diagnosis not present

## 2014-10-07 DIAGNOSIS — M47817 Spondylosis without myelopathy or radiculopathy, lumbosacral region: Secondary | ICD-10-CM | POA: Diagnosis not present

## 2014-10-07 DIAGNOSIS — S6991XA Unspecified injury of right wrist, hand and finger(s), initial encounter: Secondary | ICD-10-CM | POA: Diagnosis present

## 2014-10-07 DIAGNOSIS — Y9389 Activity, other specified: Secondary | ICD-10-CM | POA: Insufficient documentation

## 2014-10-07 NOTE — Discharge Instructions (Signed)

## 2014-10-07 NOTE — ED Provider Notes (Signed)
CSN: [id number]     Arrival date & time 10/07/14  1818 History  This chart was scribed for non-physician practitioner, Margarita Mail, PA-C working with No att. providers found, by Erling Conte, ED Scribe. This patient was seen in room TR05C/TR05C and the patient's care was started at 7:00 PM     Chief Complaint  Patient presents with  . Laceration     The history is provided by the patient. No language interpreter was used.    HPI Comments: Tracey Rodriguez is a 54 y.o. female who presents to the Emergency Department complaining of a laceration to the dorsum of right thumb that occurred 6 hours PTA. Pt was cocking a firearm and her right thumb got pinched in the metal clip of the gun. The bleeding is well controlled at this time. She washed the wound with water and placed a clean dressing. She states her tetanus is UTD at this time. She denies any numbness or tingling.    Past Medical History  Diagnosis Date  . Hypothyroidism     goiter  . Factor V deficiency   . GERD (gastroesophageal reflux disease)   . Arthritis     deg disc disease  - lower back, neck  . Neuromuscular disorder     sciatic nerve  . Fibromyalgia   . SVD (spontaneous vaginal delivery)     x 2  . Cancer     left breast surgery-lumpectomy  . PONV (postoperative nausea and vomiting)   . Hyperlipidemia    Past Surgical History  Procedure Laterality Date  . Ablation    . Appendectomy    . Breast surgery      left- lumpectomy  . Dilation and curettage of uterus    . Nose surgery    . Dilatation & currettage/hysteroscopy with resectocope N/A 04/05/2012    Procedure: hysteroscopy with endocervical curretting;  Surgeon: Allyn Kenner, DO;  Location: [facility];  Service: Gynecology;  Laterality: N/A;   Family History  Problem Relation Age of Onset  . Heart disease Father   . Heart disease Maternal Grandfather   . Heart attack Father   . Clotting disorder Father   . Breast cancer Mother   . Lung cancer  Maternal Grandfather   . Liver cancer Maternal Grandmother   . Rheumatologic disease Father    Social History  Substance Use Topics  . Smoking status: Former Smoker -- 3.00 packs/day for 22 years    Types: Cigarettes    Quit date: 02/18/2000  . Smokeless tobacco: Never Used  . Alcohol Use: 0.0 oz/week    0 Standard drinks or equivalent per week     Comment: socially   OB History    No data available     Review of Systems  Skin: Positive for wound (R thumb).  Neurological: Negative for weakness and numbness.      Allergies  Etodolac; Doxycycline; Pepto-bismol; Pork-derived products; and Sulfonamide derivatives  Home Medications   Prior to Admission medications   Medication Sig Start Date End Date Taking? Authorizing Provider  albuterol (PROVENTIL HFA;VENTOLIN HFA) 108 (90 BASE) MCG/ACT inhaler Inhale 2 puffs into the lungs every 6 (six) hours as needed for wheezing or shortness of breath.    Historical Provider, MD  amoxicillin (AMOXIL) 875 MG tablet Take 875 mg by mouth 2 (two) times daily. For bacterial infection, 14 day treatment 03/25/13   Historical Provider, MD  dicyclomine (BENTYL) 20 MG tablet Take 1 tablet (20 mg total) by mouth every  6 (six) hours as needed for spasms (for abdominal cramping). 03/27/13   Linton Flemings, MD  esomeprazole (NEXIUM) 40 MG capsule Take 1 capsule (40 mg total) by mouth daily. 08/08/10   Gatha Mayer, MD  furosemide (LASIX) 20 MG tablet Take 20 mg by mouth as needed for edema (for swelling associated with flying).  03/25/13   Historical Provider, MD  gabapentin (NEURONTIN) 100 MG capsule Take 300 mg by mouth at bedtime. 2 tabs at bed time    Historical Provider, MD  gabapentin (NEURONTIN) 300 MG capsule Take 1 capsule (300 mg total) by mouth 3 (three) times daily. 02/01/14   Wallene Huh, DPM  levothyroxine (SYNTHROID, LEVOTHROID) 50 MCG tablet Take 50 mcg by mouth daily.    Historical Provider, MD  mirabegron ER (MYRBETRIQ) 50 MG TB24 Take 50 mg  by mouth daily.    Historical Provider, MD  ondansetron (ZOFRAN ODT) 8 MG disintegrating tablet Take 1 tablet (8 mg total) by mouth every 8 (eight) hours as needed for nausea or vomiting. 03/27/13   Linton Flemings, MD  oxyCODONE-acetaminophen (PERCOCET/ROXICET) 5-325 MG per tablet Take 2 tablets by mouth every 4 (four) hours as needed for severe pain. 03/27/13   Linton Flemings, MD  saccharomyces boulardii (FLORASTOR) 250 MG capsule Take 1 capsule (250 mg total) by mouth 2 (two) times daily. 03/29/13   Amy S Esterwood, PA-C  simvastatin (ZOCOR) 20 MG tablet Take 20 mg by mouth daily.    Historical Provider, MD  traMADol (ULTRAM) 50 MG tablet Take 100 mg by mouth 3 (three) times daily as needed (usually takes in the morning.  will take another dose in the afternoon if needed).     Historical Provider, MD   Triage Vitals: BP 143/77 mmHg  Pulse 80  Temp(Src) 98.2 F (36.8 C) (Oral)  Resp 18  SpO2 97%  Physical Exam  Constitutional: She is oriented to person, place, and time. She appears well-developed and well-nourished. No distress.  HENT:  Head: Normocephalic and atraumatic.  Eyes: Conjunctivae and EOM are normal.  Neck: Neck supple. No tracheal deviation present.  Cardiovascular: Normal rate.   Pulmonary/Chest: Effort normal. No respiratory distress.  Musculoskeletal: Normal range of motion.  Neurological: She is alert and oriented to person, place, and time.  Skin: Skin is warm and dry. Laceration noted.  2cm lac on dorsum of right thumb. Edges well approximated. Full strength with flexion and extension. No tendon involvement. NVI  Psychiatric: She has a normal mood and affect. Her behavior is normal.  Nursing note and vitals reviewed.   ED Course  Procedures (including critical care time)  DIAGNOSTIC STUDIES: Oxygen Saturation is 97% on RA, normal by my interpretation.    COORDINATION OF CARE: 7:08 PM  LACERATION REPAIR PROCEDURE NOTE The patient's identification was confirmed and consent  was obtained. This procedure was performed by Margarita Mail, PA-C at 7:08 PM. Site: dorsum of right thumb Sterile procedures observed Anesthetic used (type and amt): 1% w/o epi, 3 ml Suture type/size: 5-0 prolene Length: 2 cm # of Sutures: 4 Technique:simple interrupted Complexity: simple Antibx ointment applie Tetanus UTD or ordered Site anesthetized, irrigated with NS, explored without evidence of foreign body, wound well approximated, site covered with dry, sterile dressing.  Patient tolerated procedure well without complications. Instructions for care discussed verbally and patient provided with additional written instructions for homecare and f/u.    Labs Review Labs Reviewed - No data to display  Imaging Review No results found. I have personally  reviewed and evaluated these images and lab results as part of my medical decision-making.   EKG Interpretation None      MDM   Final diagnoses:  None   Pt feels she is UTD on tetanus but is not 100% sure and will contact PCP on Monday to find out if she is UTD. We agreed to this decision  Pressure irrigation performed. Laceration occurred < 8 hours prior to repair which was well tolerated. Pt has no co morbidities to effect normal wound healing. Discussed suture home care w pt and answered questions. Pt to f-u for wound check and suture removal in 7 days. Pt is hemodynamically stable w no complaints prior to dc.    I personally performed the services described in this documentation, which was scribed in my presence. The recorded information has been reviewed and is accurate.      Margarita Mail, PA-C 10/08/14 1153  Charlesetta Shanks, MD 10/13/14 [phone]

## 2014-10-07 NOTE — ED Notes (Signed)
Laceration to R thumb by getting pinched in metal clip of a gun. No active bleeding. She washed with water and placed a clean dressing.

## 2015-06-19 ENCOUNTER — Other Ambulatory Visit: Payer: Self-pay | Admitting: Obstetrics & Gynecology

## 2015-06-19 DIAGNOSIS — R928 Other abnormal and inconclusive findings on diagnostic imaging of breast: Secondary | ICD-10-CM

## 2015-06-25 ENCOUNTER — Ambulatory Visit
Admission: RE | Admit: 2015-06-25 | Discharge: 2015-06-25 | Disposition: A | Discharge status: Home / Self Care | Payer: 59 | Source: Ambulatory Visit | Attending: Obstetrics & Gynecology | Admitting: Obstetrics & Gynecology

## 2015-06-25 ENCOUNTER — Other Ambulatory Visit: Payer: 59

## 2015-06-25 DIAGNOSIS — R928 Other abnormal and inconclusive findings on diagnostic imaging of breast: Secondary | ICD-10-CM

## 2015-10-26 ENCOUNTER — Encounter: Payer: Self-pay | Admitting: Interventional Cardiology

## 2015-11-12 ENCOUNTER — Encounter: Payer: Self-pay | Admitting: Interventional Cardiology

## 2015-11-12 ENCOUNTER — Ambulatory Visit (INDEPENDENT_AMBULATORY_CARE_PROVIDER_SITE_OTHER): Payer: 59 | Admitting: Interventional Cardiology

## 2015-11-12 VITALS — BP 150/88 | HR 78 | Ht 67.0 in | Wt 198.4 lb

## 2015-11-12 DIAGNOSIS — R0600 Dyspnea, unspecified: Secondary | ICD-10-CM | POA: Diagnosis not present

## 2015-11-12 DIAGNOSIS — E785 Hyperlipidemia, unspecified: Secondary | ICD-10-CM | POA: Diagnosis not present

## 2015-11-12 DIAGNOSIS — R0789 Other chest pain: Secondary | ICD-10-CM

## 2015-11-12 DIAGNOSIS — R42 Dizziness and giddiness: Secondary | ICD-10-CM | POA: Diagnosis not present

## 2015-11-12 NOTE — Patient Instructions (Signed)
Your physician recommends that you continue on your current medications as directed. Please refer to the Current Medication list given to you today. Your physician recommends that you schedule a follow-up appointment as needed with Dr. Varanasi.   

## 2015-11-12 NOTE — Progress Notes (Signed)
Cardiology Office Note   Date:  11/12/2015   ID:  Tracey Rodriguez, DOB December 25, 1960, MRN [id number]  PCP:  Tracey Copa, MD    No chief complaint on file. chest discomfort   Wt Readings from Last 3 Encounters:  11/12/15 198 lb 6.4 oz (90 kg)  02/01/14 179 lb (81.2 kg)  03/29/13 190 lb (86.2 kg)       History of Present Illness: Tracey Rodriguez is a 55 y.o. female  Who has hyperlipidemia.  She had carpal tunnel surgery in 10/16.  After the surgery, she had a squeezing feeling in both arms.  She had several episodes of this in December.  It happened again early 2017.    In Feb 2017, she had chest pain with arm pain with presyncope and dyspnea.  She did not fully pass out.  She was at [employer] at the time.    Earlier this year, she had a cardiac eval with Dr. Einar Rodriguez.  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  She had a nuclear stress test that was negatie for ischemia.    When wearing the monitor, she had some fatigue and this correlated to allow BP.    Past Medical History:  Diagnosis Date  . Arthritis    deg disc disease  - lower back, neck  . Cancer ([place])    left breast surgery-lumpectomy  . Factor V deficiency ([place])   . Fibromyalgia   . GERD (gastroesophageal reflux disease)   . Hyperlipidemia   . Hypothyroidism    goiter  . Neuromuscular disorder ([place])    sciatic nerve  . PONV (postoperative nausea and vomiting)   . SVD (spontaneous vaginal delivery)    x 2    Past Surgical History:  Procedure Laterality Date  . ABLATION    . APPENDECTOMY    . BREAST SURGERY     left- lumpectomy  . DILATATION & CURRETTAGE/HYSTEROSCOPY WITH RESECTOCOPE N/A 04/05/2012   Procedure: hysteroscopy with endocervical curretting;  Surgeon: Allyn Kenner, DO;  Location: [facility];  Service: Gynecology;  Laterality: N/A;  . DILATION AND CURETTAGE OF UTERUS    . NOSE SURGERY       Current Outpatient Prescriptions  Medication Sig  Dispense Refill  . albuterol (PROVENTIL HFA;VENTOLIN HFA) 108 (90 BASE) MCG/ACT inhaler Inhale 2 puffs into the lungs every 6 (six) hours as needed for wheezing or shortness of breath.    Marland Kitchen aspirin 81 MG tablet Take 81 mg by mouth daily.    . diclofenac sodium (VOLTAREN) 1 % GEL Apply 1 application topically daily as needed (arthritis).    . Doxycycline Hyclate (DORYX) 200 MG TBEC Take 200 mg by mouth daily as needed (skin inflammation).    Marland Kitchen esomeprazole (NEXIUM) 40 MG capsule Take 1 capsule (40 mg total) by mouth daily. 90 capsule 0  . furosemide (LASIX) 20 MG tablet Take 20 mg by mouth daily as needed for fluid or edema.    . gabapentin (NEURONTIN) 300 MG capsule Take 300 mg by mouth 3 (three) times daily as needed (pain).    Marland Kitchen levothyroxine (SYNTHROID, LEVOTHROID) 50 MCG tablet Take 50 mcg by mouth daily.    . medroxyPROGESTERone (PROVERA) 2.5 MG tablet Take 2.5 mg by mouth daily.  4  . mirabegron ER (MYRBETRIQ) 50 MG TB24 Take 50 mg by mouth daily.    . mupirocin ointment (BACTROBAN) 2 % Place 1 application into the nose daily as  needed (blood blisters).    . nitroGLYCERIN (NITROSTAT) 0.4 MG SL tablet Place 0.4 mg under the tongue every 5 (five) minutes as needed for chest pain (Call 911 after 3 doses).    Marland Kitchen OVER THE COUNTER MEDICATION Take 2 capsules by mouth daily. Med Name: STRESS B COMPLEX CAPSULE    . PRESCRIPTION MEDICATION Take 2.5 mg by mouth daily. Med Name: ESTROG METHL    . traMADol (ULTRAM) 50 MG tablet Take 100 mg by mouth 3 (three) times daily as needed (usually takes in the morning.  will take another dose in the afternoon if needed).      No current facility-administered medications for this visit.     Allergies:   Capsaicin; Etodolac; Doxycycline; Pepto-bismol [bismuth]; Pork-derived products; and Sulfonamide derivatives    Social History:  The patient  reports that she quit smoking about 15 years ago. Her smoking use included Cigarettes. She has a 66.00 pack-year smoking  history. She has never used smokeless tobacco. She reports that she drinks alcohol. She reports that she does not use drugs.   Family History:  The patient's family history includes Breast cancer (age of onset: 16) in her mother; Clotting disorder in her father; Heart attack in her brother and father; Heart disease in her maternal grandfather; Heart disease (age of onset: 81) in her father; Liver cancer in her maternal grandmother; Lung cancer in her maternal grandfather; Rheumatologic disease in her father.    ROS:  Please see the history of present illness.   Otherwise, review of systems are positive for occasional leg swelling.   All other systems are reviewed and negative.    PHYSICAL EXAM: VS:  BP (!) 150/88   Pulse 78   Ht 5\' 7"  (1.702 m)   Wt 198 lb 6.4 oz (90 kg)   BMI 31.07 kg/m  , BMI Body mass index is 31.07 kg/m. GEN: Well nourished, well developed, in no acute distress  HEENT: normal  Neck: no JVD, carotid bruits, or masses Cardiac: RRR; no murmurs, rubs, or gallops,no edema  Respiratory:  clear to auscultation bilaterally, normal work of breathing GI: soft, nontender, nondistended, + BS MS: no deformity or atrophy  Skin: warm and dry, no rash Neuro:  Strength and sensation are intact Psych: euthymic mood, full affect   EKG:   The ekg ordered today demonstrates normal sinus rhythm, no ST segment changes   Recent Labs: No results found for requested labs within last 8760 hours.   Lipid Panel No results found for: CHOL, TRIG, HDL, CHOLHDL, VLDL, LDLCALC, LDLDIRECT   Other studies Reviewed: Additional studies/ records that were reviewed today with results demonstrating: Normal stress test with normal ejection fraction in 2017 done at an outside cardiology office.   ASSESSMENT AND PLAN:  1. Chest discomfort/dyspnea: Symptoms have resolved. She had a negative cardiac workup including essentially negative monitor and negative nuclear stress test.  2. Intermittent  dizziness: I encouraged her to stay well hydrated. She drinks 3 cups of coffee a day. She does not drink water at this time. She has had some of these symptoms when she is sleep deprived. I stressed the importance of getting regular sleep and staying well hydrated. 3. Hyperlipidemia: She has had elevated LDL cholesterol. She has tried fish oil. She has tried red yeast rice but that became expensive. She tried Crestor recently but had itching. She has tried simvastatin in the past and states that she had memory issues and couldn't think clearly. She could try atorvastatin.  She is likely going to retry red yeast Rice.  She will manage this with her primary care physician.   Current medicines are reviewed at length with the patient today.  The patient concerns regarding her medicines were addressed.  The following changes have been made:  No change  Labs/ tests ordered today include:  No orders of the defined types were placed in this encounter.   Recommend 150 minutes/week of aerobic exercise Low fat, low carb, high fiber diet recommended  Disposition:   FU prn   Signed, Larae Grooms, MD  11/12/2015 11:49 AM    [facility] [place], [place], [place]  [address] Phone: [phone]; Fax: [phone]

## 2015-11-30 ENCOUNTER — Encounter: Payer: Self-pay | Admitting: Internal Medicine

## 2016-03-15 DIAGNOSIS — F5101 Primary insomnia: Secondary | ICD-10-CM | POA: Insufficient documentation

## 2016-03-15 DIAGNOSIS — M47816 Spondylosis without myelopathy or radiculopathy, lumbar region: Secondary | ICD-10-CM | POA: Insufficient documentation

## 2016-03-15 DIAGNOSIS — M19041 Primary osteoarthritis, right hand: Secondary | ICD-10-CM | POA: Insufficient documentation

## 2016-03-15 DIAGNOSIS — R5383 Other fatigue: Secondary | ICD-10-CM | POA: Insufficient documentation

## 2016-03-15 DIAGNOSIS — M47812 Spondylosis without myelopathy or radiculopathy, cervical region: Secondary | ICD-10-CM | POA: Insufficient documentation

## 2016-03-15 DIAGNOSIS — Z9889 Other specified postprocedural states: Secondary | ICD-10-CM | POA: Insufficient documentation

## 2016-03-15 DIAGNOSIS — M19042 Primary osteoarthritis, left hand: Secondary | ICD-10-CM | POA: Insufficient documentation

## 2016-03-15 NOTE — Progress Notes (Signed)
Office Visit Note  Patient: Tracey Rodriguez             Date of Birth: 12-01-1960           MRN: [id number]             PCP: Cammy Copa, MD Referring: Aura Dials, MD Visit Date: 03/18/2016 Occupation: @GUAROCC @    Subjective:  Neck pain   History of Present Illness: HOLLYE BLACKSHER is a 56 y.o. female with history of fibromyalgia osteoarthritis and disc disease. According to her she fell last week while she was walking at the park. After the fall she had some swelling and redness in her left ankle which got better after icing. She is been having some discomfort in the neck, upper back and lower back since then. She denies any joint swelling. Hand pain is tolerable. Her fibromyalgia continues to be active she describes her pain on scale of 0-10 about 7 and fatigue but 7 as well. She sleeps better with Unisom.  Activities of Daily Living:  Patient reports morning stiffness for 5 minutes.   Patient Reports nocturnal pain.  Difficulty dressing/grooming: Denies Difficulty climbing stairs: Reports Difficulty getting out of chair: Reports Difficulty using hands for taps, buttons, cutlery, and/or writing: Reports   Review of Systems  Constitutional: Positive for fatigue. Negative for night sweats, weight gain, weight loss and weakness.  HENT: Negative for mouth sores, trouble swallowing, trouble swallowing, mouth dryness and nose dryness.   Eyes: Positive for dryness. Negative for pain, redness and visual disturbance.  Respiratory: Negative for cough, shortness of breath and difficulty breathing.   Cardiovascular: Negative for chest pain, palpitations, hypertension, irregular heartbeat and swelling in legs/feet.  Gastrointestinal: Negative for blood in stool, constipation and diarrhea.  Endocrine: Negative for increased urination.  Genitourinary: Negative for vaginal dryness.  Musculoskeletal: Positive for arthralgias, joint pain, myalgias, morning stiffness and  myalgias. Negative for joint swelling, muscle weakness and muscle tenderness.  Skin: Negative for color change, rash, hair loss, skin tightness, ulcers and sensitivity to sunlight.  Allergic/Immunologic: Negative for susceptible to infections.  Neurological: Negative for dizziness, memory loss and night sweats.  Hematological: Negative for swollen glands.  Psychiatric/Behavioral: Positive for sleep disturbance. Negative for depressed mood. The patient is not nervous/anxious.     PMFS History:  Patient Active Problem List   Diagnosis Date Noted  . Other fatigue 03/15/2016  . Primary insomnia 03/15/2016  . Primary osteoarthritis of both hands 03/15/2016  . DDD cervical spine 03/15/2016  . 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 History of bilateral carpal tunnel release 03/15/2016  . Unspecified hypothyroidism 03/30/2013  . Fibromyalgia 03/30/2013  . Breast cancer ([place]) 03/30/2013  . Factor V deficiency ([place]) 03/30/2013  . Dyspnea 02/02/2013  . COPD (chronic obstructive pulmonary disease) ([place]) 02/02/2013  . Restrictive lung disease 02/02/2013    Past Medical History:  Diagnosis Date  . Arthritis    deg disc disease  - lower back, neck  . Cancer ([place])    left breast surgery-lumpectomy  . Factor V deficiency ([place])   . Fibromyalgia   . GERD (gastroesophageal reflux disease)   . Hyperlipidemia   . Hypothyroidism    goiter  . Neuromuscular disorder ([place])    sciatic nerve  . PONV (postoperative nausea and vomiting)   . SVD (spontaneous vaginal delivery)    x 2    Family History  Problem Relation Age of Onset  . Heart disease Father 56  . Heart attack Father   .  Clotting disorder Father   . Rheumatologic disease Father   . Heart disease Maternal Grandfather   . Lung cancer Maternal Grandfather   . Breast cancer Mother 20  . Liver cancer Maternal Grandmother   . Heart attack Brother   . Cancer Brother    Past Surgical History:  Procedure Laterality Date  .  ABLATION    . APPENDECTOMY    . BREAST SURGERY     left- lumpectomy  . DILATATION & CURRETTAGE/HYSTEROSCOPY WITH RESECTOCOPE N/A 04/05/2012   Procedure: hysteroscopy with endocervical curretting;  Surgeon: Allyn Kenner, DO;  Location: [facility];  Service: Gynecology;  Laterality: N/A;  . DILATION AND CURETTAGE OF UTERUS    . NOSE SURGERY     Social History   Social History Narrative  . No narrative on file     Objective: Vital Signs: BP (!) 167/72 (BP Location: Left Arm, Patient Position: Sitting, Cuff Size: Normal)   Pulse 83   Resp 14   Ht 5\' 7"  (1.702 m)   Wt 201 lb (91.2 kg)   BMI 31.48 kg/m    Physical Exam  Constitutional: She is oriented to person, place, and time. She appears well-developed and well-nourished.  HENT:  Head: Normocephalic and atraumatic.  Eyes: Conjunctivae and EOM are normal.  Neck: Normal range of motion.  Cardiovascular: Normal rate, regular rhythm, normal heart sounds and intact distal pulses.   Pulmonary/Chest: Effort normal and breath sounds normal.  Abdominal: Soft. Bowel sounds are normal.  Lymphadenopathy:    She has no cervical adenopathy.  Neurological: She is alert and oriented to person, place, and time.  Skin: Skin is warm and dry. Capillary refill takes less than 2 seconds.  Psychiatric: She has a normal mood and affect. Her behavior is normal.  Nursing note and vitals reviewed.    Musculoskeletal Exam: C-spine, thoracic, lumbar spine good range of motion she has bilateral trapezius is spasm she is also had some per vertebral muscle tenderness. Shoulder joints elbow joints wrist joint MCPs PIPs DIPs with good range of motion she has thickening of PIP/DIP joints in her hands consistent with osteoarthritis. Hip joints knee joints ankles MTPs PIPs with good range of motion. She tenderness over bilateral trochanteric bursa area. Fibromyalgia tender points are 16 out of 18 positive.  CDAI Exam: No CDAI exam completed.     Investigation: Findings:  05/22/2015 CBC normal, CMP normal, TSH normal, lipid panel elevated LDL 168, 08/13/2015 CBC normal, CMP normal, TSH normal, LDL 168, urine drug screen consistent with medication intake    Imaging: No results found.  Speciality Comments: No specialty comments available.    Procedures:  No procedures performed Allergies: Capsaicin; Etodolac; Doxycycline; Pepto-bismol [bismuth]; Pork-derived products; and Sulfonamide derivatives   Assessment / Plan:     Visit Diagnoses: Fibromyalgia: She continues to have pain and discomfort all over with positive tender points.  Other fatigue: Fatigue is related to chronic insomnia and fibromyalgia.  Primary insomnia: It is better with the Unisom over-the-counter.  Primary osteoarthritis of both hands: Joint protection and muscle strengthening was discussed.  DDD cervical spine: She has chronic pain and trapezius is spasm. I offered cortisone injection which she declined. Will refer to physical therapy.  DDD lumbar spine: She is some chronic pain.  Her other medical problems are listed as follows:  History of bilateral carpal tunnel release  History of COPD  Factor V deficiency ([employer])  History of hypothyroidism  Family history of fatty liver  History of breast cancer  Orders: No orders of the defined types were placed in this encounter.  No orders of the defined types were placed in this encounter.   Face-to-face time spent with patient was 30 minutes. 50% of time was spent in counseling and coordination of care.  Follow-Up Instructions: Return in about 6 months (around 09/15/2016) for FMS, DDD.   Bo Merino, MD  Note - This record has been created using Editor, commissioning.  Chart creation errors have been sought, but may not always  have been located. Such creation errors do not reflect on  the standard of medical care.

## 2016-03-18 ENCOUNTER — Encounter: Payer: Self-pay | Admitting: Rheumatology

## 2016-03-18 ENCOUNTER — Ambulatory Visit (INDEPENDENT_AMBULATORY_CARE_PROVIDER_SITE_OTHER): Payer: 59 | Admitting: Rheumatology

## 2016-03-18 VITALS — BP 167/72 | HR 83 | Resp 14 | Ht 67.0 in | Wt 201.0 lb

## 2016-03-18 DIAGNOSIS — Z8379 Family history of other diseases of the digestive system: Secondary | ICD-10-CM

## 2016-03-18 DIAGNOSIS — M19041 Primary osteoarthritis, right hand: Secondary | ICD-10-CM

## 2016-03-18 DIAGNOSIS — Z9889 Other specified postprocedural states: Secondary | ICD-10-CM

## 2016-03-18 DIAGNOSIS — D682 Hereditary deficiency of other clotting factors: Secondary | ICD-10-CM

## 2016-03-18 DIAGNOSIS — M5030 Other cervical disc degeneration, unspecified cervical region: Secondary | ICD-10-CM | POA: Diagnosis not present

## 2016-03-18 DIAGNOSIS — F5101 Primary insomnia: Secondary | ICD-10-CM | POA: Diagnosis not present

## 2016-03-18 DIAGNOSIS — M19042 Primary osteoarthritis, left hand: Secondary | ICD-10-CM

## 2016-03-18 DIAGNOSIS — M47812 Spondylosis without myelopathy or radiculopathy, cervical region: Secondary | ICD-10-CM

## 2016-03-18 DIAGNOSIS — R5383 Other fatigue: Secondary | ICD-10-CM

## 2016-03-18 DIAGNOSIS — M47816 Spondylosis without myelopathy or radiculopathy, lumbar region: Secondary | ICD-10-CM

## 2016-03-18 DIAGNOSIS — Z853 Personal history of malignant neoplasm of breast: Secondary | ICD-10-CM | POA: Diagnosis not present

## 2016-03-18 DIAGNOSIS — Z8639 Personal history of other endocrine, nutritional and metabolic disease: Secondary | ICD-10-CM | POA: Diagnosis not present

## 2016-03-18 DIAGNOSIS — J449 Chronic obstructive pulmonary disease, unspecified: Secondary | ICD-10-CM | POA: Diagnosis not present

## 2016-03-18 DIAGNOSIS — M797 Fibromyalgia: Secondary | ICD-10-CM

## 2016-04-02 DIAGNOSIS — J209 Acute bronchitis, unspecified: Secondary | ICD-10-CM | POA: Diagnosis not present

## 2016-04-02 DIAGNOSIS — R509 Fever, unspecified: Secondary | ICD-10-CM | POA: Diagnosis not present

## 2016-04-21 DIAGNOSIS — N3281 Overactive bladder: Secondary | ICD-10-CM | POA: Diagnosis not present

## 2016-05-26 ENCOUNTER — Other Ambulatory Visit (INDEPENDENT_AMBULATORY_CARE_PROVIDER_SITE_OTHER): Payer: Self-pay | Admitting: Rheumatology

## 2016-05-26 MED ORDER — TRAMADOL HCL 50 MG PO TABS: 100.0000 mg | ORAL_TABLET | Freq: Three times a day (TID) | ORAL | 0 refills | Status: DC | PRN

## 2016-05-26 NOTE — Telephone Encounter (Signed)
Patient having problems with [facility], tramadol 50mg  They told patient they have sent [facility] 3 requests for this medication. She wants to speak with someone regarding this. Please call cb#[phone] [facility]  fax #

## 2016-05-26 NOTE — Telephone Encounter (Signed)
We will need to discuss decreasing tramadol usage from 6 per day to 3 a day over next 6 months after July visit We will discuss at next office visit . We need to let pt know this is our plan starting now.

## 2016-05-26 NOTE — Telephone Encounter (Signed)
Last Visit: 03/18/16 Next Visit: 09/15/16 UDS: 09/13/15  Narc Agreement: 07/11/15   Okay to refill Tramadol?

## 2016-05-27 NOTE — Telephone Encounter (Signed)
Patient advised of plan and verbalized understanding.

## 2016-06-05 DIAGNOSIS — Z01419 Encounter for gynecological examination (general) (routine) without abnormal findings: Secondary | ICD-10-CM | POA: Diagnosis not present

## 2016-06-05 DIAGNOSIS — N390 Urinary tract infection, site not specified: Secondary | ICD-10-CM | POA: Diagnosis not present

## 2016-06-17 DIAGNOSIS — R310 Gross hematuria: Secondary | ICD-10-CM | POA: Diagnosis not present

## 2016-06-24 DIAGNOSIS — R319 Hematuria, unspecified: Secondary | ICD-10-CM | POA: Diagnosis not present

## 2016-06-24 DIAGNOSIS — R310 Gross hematuria: Secondary | ICD-10-CM | POA: Diagnosis not present

## 2016-07-21 DIAGNOSIS — D303 Benign neoplasm of bladder: Secondary | ICD-10-CM | POA: Diagnosis not present

## 2016-07-21 DIAGNOSIS — R310 Gross hematuria: Secondary | ICD-10-CM | POA: Diagnosis not present

## 2016-07-24 DIAGNOSIS — Z1231 Encounter for screening mammogram for malignant neoplasm of breast: Secondary | ICD-10-CM | POA: Diagnosis not present

## 2016-07-28 ENCOUNTER — Other Ambulatory Visit: Payer: Self-pay | Admitting: Obstetrics & Gynecology

## 2016-07-28 DIAGNOSIS — R928 Other abnormal and inconclusive findings on diagnostic imaging of breast: Secondary | ICD-10-CM

## 2016-07-30 ENCOUNTER — Ambulatory Visit
Admission: RE | Admit: 2016-07-30 | Discharge: 2016-07-30 | Disposition: A | Discharge status: Home / Self Care | Payer: 59 | Source: Ambulatory Visit | Attending: Obstetrics & Gynecology | Admitting: Obstetrics & Gynecology

## 2016-07-30 DIAGNOSIS — R928 Other abnormal and inconclusive findings on diagnostic imaging of breast: Secondary | ICD-10-CM

## 2016-07-30 DIAGNOSIS — N6324 Unspecified lump in the left breast, lower inner quadrant: Secondary | ICD-10-CM | POA: Diagnosis not present

## 2016-08-07 DIAGNOSIS — Z0100 Encounter for examination of eyes and vision without abnormal findings: Secondary | ICD-10-CM | POA: Diagnosis not present

## 2016-08-11 ENCOUNTER — Other Ambulatory Visit: Payer: Self-pay | Admitting: Obstetrics & Gynecology

## 2016-08-11 ENCOUNTER — Encounter (HOSPITAL_COMMUNITY): Payer: Self-pay

## 2016-08-11 DIAGNOSIS — T7840XA Allergy, unspecified, initial encounter: Secondary | ICD-10-CM | POA: Diagnosis not present

## 2016-08-11 DIAGNOSIS — N63 Unspecified lump in unspecified breast: Secondary | ICD-10-CM

## 2016-08-11 DIAGNOSIS — Z5321 Procedure and treatment not carried out due to patient leaving prior to being seen by health care provider: Secondary | ICD-10-CM | POA: Insufficient documentation

## 2016-08-11 NOTE — ED Triage Notes (Signed)
Pt reports she was stung multiple times by hornets this evening around 630 pm. She states she was stung 4 times: right hand, left forearm, right axilla, and left ankle. Swelling and redness noted to these areas. She states she took 6 teaspoons of Childrens benadryl and 2 sudafed tables at 8pm. She is speaking in clear complete sentences but states her throat feels scratchy.

## 2016-08-11 NOTE — ED Notes (Signed)
Pt reports she feels lightheaded.

## 2016-08-12 ENCOUNTER — Emergency Department (HOSPITAL_COMMUNITY)
Admission: EM | Admit: 2016-08-12 | Discharge: 2016-08-12 | Disposition: A | Discharge status: Home / Self Care | Payer: 59 | Attending: Emergency Medicine | Admitting: Emergency Medicine

## 2016-08-12 DIAGNOSIS — T63451A Toxic effect of venom of hornets, accidental (unintentional), initial encounter: Secondary | ICD-10-CM | POA: Diagnosis not present

## 2016-08-12 NOTE — ED Notes (Signed)
Patient and family at front desk saying they are leaving. This RN encouraged patient to wait a bit longer since she has been here over 4h already. Explained that additional medications may help. Patient appreciated concern but said she would go home and take more Benadryl.

## 2016-08-15 ENCOUNTER — Ambulatory Visit
Admission: RE | Admit: 2016-08-15 | Discharge: 2016-08-15 | Disposition: A | Discharge status: Home / Self Care | Payer: 59 | Source: Ambulatory Visit | Attending: Obstetrics & Gynecology | Admitting: Obstetrics & Gynecology

## 2016-08-15 DIAGNOSIS — N6324 Unspecified lump in the left breast, lower inner quadrant: Secondary | ICD-10-CM | POA: Diagnosis not present

## 2016-08-15 DIAGNOSIS — N63 Unspecified lump in unspecified breast: Secondary | ICD-10-CM

## 2016-08-15 DIAGNOSIS — N6012 Diffuse cystic mastopathy of left breast: Secondary | ICD-10-CM | POA: Diagnosis not present

## 2016-09-11 DIAGNOSIS — Z853 Personal history of malignant neoplasm of breast: Secondary | ICD-10-CM | POA: Insufficient documentation

## 2016-09-11 NOTE — Progress Notes (Signed)
Office Visit Note  Patient: Tracey Rodriguez             Date of Birth: Mar 24, 1960           MRN: [id number]             PCP: Aura Dials, MD Referring: Aura Dials, MD Visit Date: 09/15/2016 Occupation: @GUAROCC @    Subjective:  Right hip and lower back pain.   History of Present Illness: Tracey Rodriguez is a 56 y.o. female with history of fibromyalgia osteoarthritis and disc disease. She states she decided to taper off tramadol. She's been using Aleve on when necessary basis now. She states she continues to have a lot of discomfort in her right hip and lower part of her back. She also has some discomfort in her neck. She does not have much discomfort in her hands. She does have increased pain from fibromyalgia especially with the weather change.   Activities of Daily Living:  Patient reports morning stiffness for 15 minutes.   Patient Reports nocturnal pain.  Difficulty dressing/grooming: Denies Difficulty climbing stairs: Reports Difficulty getting out of chair: Reports Difficulty using hands for taps, buttons, cutlery, and/or writing: Denies   Review of Systems  Constitutional: Positive for fatigue. Negative for night sweats, weight gain, weight loss and weakness.  HENT: Negative for mouth sores, trouble swallowing, trouble swallowing, mouth dryness and nose dryness.   Eyes: Positive for dryness. Negative for pain, redness and visual disturbance.  Respiratory: Negative for cough, shortness of breath and difficulty breathing.   Cardiovascular: Negative for chest pain, palpitations, hypertension, irregular heartbeat and swelling in legs/feet.  Gastrointestinal: Negative for blood in stool, constipation and diarrhea.  Endocrine: Negative for increased urination.  Genitourinary: Negative for vaginal dryness.  Musculoskeletal: Positive for arthralgias, joint pain, myalgias, morning stiffness and myalgias. Negative for joint swelling, muscle weakness and muscle  tenderness.  Skin: Negative for color change, rash, hair loss, skin tightness, ulcers and sensitivity to sunlight.  Allergic/Immunologic: Negative for susceptible to infections.  Neurological: Negative for dizziness, memory loss and night sweats.  Hematological: Negative for swollen glands.  Psychiatric/Behavioral: Positive for sleep disturbance. Negative for depressed mood. The patient is not nervous/anxious.     PMFS History:  Patient Active Problem List   Diagnosis Date Noted  . History of breast cancer 09/11/2016  . Other fatigue 03/15/2016  . Primary insomnia 03/15/2016  . Primary osteoarthritis of both hands 03/15/2016  . DDD cervical spine 03/15/2016  . 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 History of bilateral carpal tunnel release 03/15/2016  . Unspecified hypothyroidism 03/30/2013  . Fibromyalgia 03/30/2013  . Breast cancer ([place]) 03/30/2013  . 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 Dyspnea 02/02/2013  . COPD (chronic obstructive pulmonary disease) ([place]) 02/02/2013  . Restrictive lung disease 02/02/2013    Past Medical History:  Diagnosis Date  . Arthritis    deg disc disease  - lower back, neck  . Cancer ([employer])    left breast surgery-lumpectomy  . Factor V deficiency ([place])   . Fibromyalgia   . GERD (gastroesophageal reflux disease)   . Hyperlipidemia   . Hypothyroidism    goiter  . Neuromuscular disorder ([place])    sciatic nerve  . PONV (postoperative nausea and vomiting)   . SVD (spontaneous vaginal delivery)    x 2    Family History  Problem Relation Age of Onset  . Heart disease Father 2  . Heart attack Father   . Clotting disorder Father   .  Rheumatologic disease Father   . Heart disease Maternal Grandfather   . Lung cancer Maternal Grandfather   . Breast cancer Mother 74  . Liver cancer Maternal Grandmother   . Heart attack Brother   . Cancer Brother   . Breast cancer Maternal Aunt 60   Past Surgical History:  Procedure Laterality Date    . ABLATION    . APPENDECTOMY    . BREAST EXCISIONAL BIOPSY Left   . BREAST SURGERY     left- lumpectomy  . DILATATION & CURRETTAGE/HYSTEROSCOPY WITH RESECTOCOPE N/A 04/05/2012   Procedure: hysteroscopy with endocervical curretting;  Surgeon: Allyn Kenner, DO;  Location: [facility];  Service: Gynecology;  Laterality: N/A;  . DILATION AND CURETTAGE OF UTERUS    . NOSE SURGERY     Social History   Social History Narrative  . No narrative on file     Objective: Vital Signs: BP (!) 155/75 (BP Location: Left Arm, Patient Position: Sitting, Cuff Size: Normal)   Pulse 78   Ht 5\' 6"  (1.676 m)   Wt 192 lb (87.1 kg)   BMI 30.99 kg/m    Physical Exam  Constitutional: She is oriented to person, place, and time. She appears well-developed and well-nourished.  HENT:  Head: Normocephalic and atraumatic.  Eyes: Conjunctivae and EOM are normal.  Neck: Normal range of motion.  Cardiovascular: Normal rate, regular rhythm, normal heart sounds and intact distal pulses.   Pulmonary/Chest: Effort normal and breath sounds normal.  Abdominal: Soft. Bowel sounds are normal.  Lymphadenopathy:    She has no cervical adenopathy.  Neurological: She is alert and oriented to person, place, and time.  Skin: Skin is warm and dry. Capillary refill takes less than 2 seconds.  Psychiatric: She has a normal mood and affect. Her behavior is normal.  Nursing note and vitals reviewed.    Musculoskeletal Exam: C-spine and thoracic spine limited range of motion with discomfort. Shoulder joints although joints wrist joint MCPs PIPs DIPs with good range of motion. She had tenderness on palpation over right SI joint. Right hip joint has some discomfort with range of motion. Knee joints ankles MTPs PIPs with good range of motion with no synovitis.  CDAI Exam: No CDAI exam completed.    Investigation: No additional findings.   Imaging: No results found.  Speciality Comments: No specialty comments  available.    Procedures:  Large Joint Inj Date/Time: 09/15/2016 10:15 AM Performed by: Bo Merino Authorized by: Bo Merino   Consent Given by:  Patient Site marked: the procedure site was marked   Timeout: prior to procedure the correct patient, procedure, and site was verified   Indications:  Pain Location:  Sacroiliac Site:  R sacroiliac joint Prep: patient was prepped and draped in usual sterile fashion   Needle Size:  27 G Needle Length:  1.5 inches Approach:  Superior Ultrasound Guidance: No   Fluoroscopic Guidance: No   Arthrogram: No   Medications:  1 mL lidocaine 1 %; 40 mg triamcinolone acetonide 40 MG/ML Aspiration Attempted: Yes   Aspirate amount (mL):  0 Patient tolerance:  Patient tolerated the procedure well with no immediate complications   Allergies: Capsaicin; Etodolac; Bee venom; Doxycycline; Pepto-bismol [bismuth]; Pork-derived products; and Sulfonamide derivatives   Assessment / Plan:     Visit Diagnoses: Fibromyalgia -she's having generalized pain and discomfort especially with the weather change. She has discontinued tramadol and is using Aleve or ibuprofen when necessary. She states her labs monitored by her PCP. Plan: Ambulatory referral  to Physical Therapy  Other fatigue: Relates to insomnia  Primary insomnia: Using [employer]  Primary osteoarthritis of both hands joint protection and muscle strengthening discussed.  Chronic right SI joint pain: She's having severe pain and discomfort in her SI joint. After informed consent was obtained right SI joint was prepped in sterile fashion and injected with cortisone as described above.  DDD cervical spine: Chronic pain - Plan: Ambulatory referral to Physical Therapy  DDD Lumbar spine: Chronic pain - Plan: Ambulatory referral to Physical Therapy  History of bilateral carpal tunnel release  Chronic obstructive pulmonary disease, unspecified COPD type ([place])  Factor V deficiency  ([place])  History of breast cancer    Orders: Orders Placed This Encounter  Procedures  . Large Joint Injection/Arthrocentesis  . Ambulatory referral to Physical Therapy   No orders of the defined types were placed in this encounter.   Face-to-face time spent with patient was 30 minutes. 50% of time was spent in counseling and coordination of care.  Follow-Up Instructions: Return in about 6 months (around 03/18/2017) for FMS,OA,DDD.   Bo Merino, MD  Note - This record has been created using Editor, commissioning.  Chart creation errors have been sought, but may not always  have been located. Such creation errors do not reflect on  the standard of medical care.

## 2016-09-15 ENCOUNTER — Ambulatory Visit (INDEPENDENT_AMBULATORY_CARE_PROVIDER_SITE_OTHER): Payer: 59 | Admitting: Rheumatology

## 2016-09-15 ENCOUNTER — Encounter: Payer: Self-pay | Admitting: Rheumatology

## 2016-09-15 VITALS — BP 155/75 | HR 78 | Ht 66.0 in | Wt 192.0 lb

## 2016-09-15 DIAGNOSIS — M47812 Spondylosis without myelopathy or radiculopathy, cervical region: Secondary | ICD-10-CM

## 2016-09-15 DIAGNOSIS — J449 Chronic obstructive pulmonary disease, unspecified: Secondary | ICD-10-CM | POA: Diagnosis not present

## 2016-09-15 DIAGNOSIS — M5030 Other cervical disc degeneration, unspecified cervical region: Secondary | ICD-10-CM | POA: Diagnosis not present

## 2016-09-15 DIAGNOSIS — M533 Sacrococcygeal disorders, not elsewhere classified: Secondary | ICD-10-CM | POA: Diagnosis not present

## 2016-09-15 DIAGNOSIS — M47816 Spondylosis without myelopathy or radiculopathy, lumbar region: Secondary | ICD-10-CM

## 2016-09-15 DIAGNOSIS — M19041 Primary osteoarthritis, right hand: Secondary | ICD-10-CM

## 2016-09-15 DIAGNOSIS — M19042 Primary osteoarthritis, left hand: Secondary | ICD-10-CM

## 2016-09-15 DIAGNOSIS — D682 Hereditary deficiency of other clotting factors: Secondary | ICD-10-CM

## 2016-09-15 DIAGNOSIS — F5101 Primary insomnia: Secondary | ICD-10-CM

## 2016-09-15 DIAGNOSIS — Z9889 Other specified postprocedural states: Secondary | ICD-10-CM | POA: Diagnosis not present

## 2016-09-15 DIAGNOSIS — R5383 Other fatigue: Secondary | ICD-10-CM

## 2016-09-15 DIAGNOSIS — Z853 Personal history of malignant neoplasm of breast: Secondary | ICD-10-CM

## 2016-09-15 DIAGNOSIS — G8929 Other chronic pain: Secondary | ICD-10-CM | POA: Diagnosis not present

## 2016-09-15 DIAGNOSIS — M797 Fibromyalgia: Secondary | ICD-10-CM

## 2016-09-15 MED ORDER — TRIAMCINOLONE ACETONIDE 40 MG/ML IJ SUSP
40.0000 mg | INTRAMUSCULAR | Status: AC | PRN
  Administered 2016-09-15: 40 mg via INTRA_ARTICULAR

## 2016-09-15 MED ORDER — LIDOCAINE HCL 1 % IJ SOLN
1.0000 mL | INTRAMUSCULAR | Status: AC | PRN
  Administered 2016-09-15: 1 mL

## 2016-09-24 ENCOUNTER — Ambulatory Visit: Payer: 59 | Admitting: Rheumatology

## 2016-09-24 VITALS — BP 178/82 | HR 70

## 2016-10-15 DIAGNOSIS — M797 Fibromyalgia: Secondary | ICD-10-CM | POA: Diagnosis not present

## 2016-10-21 DIAGNOSIS — M542 Cervicalgia: Secondary | ICD-10-CM | POA: Diagnosis not present

## 2016-10-21 DIAGNOSIS — M797 Fibromyalgia: Secondary | ICD-10-CM | POA: Diagnosis not present

## 2016-10-21 DIAGNOSIS — M5136 Other intervertebral disc degeneration, lumbar region: Secondary | ICD-10-CM | POA: Diagnosis not present

## 2016-10-23 DIAGNOSIS — M5136 Other intervertebral disc degeneration, lumbar region: Secondary | ICD-10-CM | POA: Diagnosis not present

## 2016-10-23 DIAGNOSIS — M542 Cervicalgia: Secondary | ICD-10-CM | POA: Diagnosis not present

## 2016-10-23 DIAGNOSIS — M797 Fibromyalgia: Secondary | ICD-10-CM | POA: Diagnosis not present

## 2016-10-27 DIAGNOSIS — M542 Cervicalgia: Secondary | ICD-10-CM | POA: Diagnosis not present

## 2016-10-27 DIAGNOSIS — M5136 Other intervertebral disc degeneration, lumbar region: Secondary | ICD-10-CM | POA: Diagnosis not present

## 2016-10-27 DIAGNOSIS — M797 Fibromyalgia: Secondary | ICD-10-CM | POA: Diagnosis not present

## 2016-11-03 DIAGNOSIS — M797 Fibromyalgia: Secondary | ICD-10-CM | POA: Diagnosis not present

## 2016-11-03 DIAGNOSIS — M542 Cervicalgia: Secondary | ICD-10-CM | POA: Diagnosis not present

## 2016-11-03 DIAGNOSIS — M5136 Other intervertebral disc degeneration, lumbar region: Secondary | ICD-10-CM | POA: Diagnosis not present

## 2016-11-05 DIAGNOSIS — M797 Fibromyalgia: Secondary | ICD-10-CM | POA: Diagnosis not present

## 2016-11-05 DIAGNOSIS — M542 Cervicalgia: Secondary | ICD-10-CM | POA: Diagnosis not present

## 2016-11-05 DIAGNOSIS — M5136 Other intervertebral disc degeneration, lumbar region: Secondary | ICD-10-CM | POA: Diagnosis not present

## 2016-11-10 DIAGNOSIS — M5136 Other intervertebral disc degeneration, lumbar region: Secondary | ICD-10-CM | POA: Diagnosis not present

## 2016-11-10 DIAGNOSIS — M542 Cervicalgia: Secondary | ICD-10-CM | POA: Diagnosis not present

## 2016-11-10 DIAGNOSIS — M797 Fibromyalgia: Secondary | ICD-10-CM | POA: Diagnosis not present

## 2016-11-12 DIAGNOSIS — M5136 Other intervertebral disc degeneration, lumbar region: Secondary | ICD-10-CM | POA: Diagnosis not present

## 2016-11-12 DIAGNOSIS — M797 Fibromyalgia: Secondary | ICD-10-CM | POA: Diagnosis not present

## 2016-11-12 DIAGNOSIS — M542 Cervicalgia: Secondary | ICD-10-CM | POA: Diagnosis not present

## 2016-11-14 DIAGNOSIS — M549 Dorsalgia, unspecified: Secondary | ICD-10-CM | POA: Diagnosis not present

## 2016-11-14 DIAGNOSIS — M542 Cervicalgia: Secondary | ICD-10-CM | POA: Diagnosis not present

## 2016-11-14 DIAGNOSIS — M47812 Spondylosis without myelopathy or radiculopathy, cervical region: Secondary | ICD-10-CM | POA: Diagnosis not present

## 2016-11-14 DIAGNOSIS — Q762 Congenital spondylolisthesis: Secondary | ICD-10-CM | POA: Diagnosis not present

## 2016-11-14 DIAGNOSIS — M5030 Other cervical disc degeneration, unspecified cervical region: Secondary | ICD-10-CM | POA: Diagnosis not present

## 2016-11-14 DIAGNOSIS — M546 Pain in thoracic spine: Secondary | ICD-10-CM | POA: Diagnosis not present

## 2016-11-20 DIAGNOSIS — M542 Cervicalgia: Secondary | ICD-10-CM | POA: Diagnosis not present

## 2016-11-20 DIAGNOSIS — M797 Fibromyalgia: Secondary | ICD-10-CM | POA: Diagnosis not present

## 2016-11-20 DIAGNOSIS — M5136 Other intervertebral disc degeneration, lumbar region: Secondary | ICD-10-CM | POA: Diagnosis not present

## 2016-11-24 DIAGNOSIS — M797 Fibromyalgia: Secondary | ICD-10-CM | POA: Diagnosis not present

## 2016-11-24 DIAGNOSIS — M5136 Other intervertebral disc degeneration, lumbar region: Secondary | ICD-10-CM | POA: Diagnosis not present

## 2016-11-24 DIAGNOSIS — M542 Cervicalgia: Secondary | ICD-10-CM | POA: Diagnosis not present

## 2016-11-26 DIAGNOSIS — M797 Fibromyalgia: Secondary | ICD-10-CM | POA: Diagnosis not present

## 2016-11-26 DIAGNOSIS — M5136 Other intervertebral disc degeneration, lumbar region: Secondary | ICD-10-CM | POA: Diagnosis not present

## 2016-11-26 DIAGNOSIS — M542 Cervicalgia: Secondary | ICD-10-CM | POA: Diagnosis not present

## 2016-12-02 DIAGNOSIS — M5136 Other intervertebral disc degeneration, lumbar region: Secondary | ICD-10-CM | POA: Diagnosis not present

## 2016-12-02 DIAGNOSIS — M542 Cervicalgia: Secondary | ICD-10-CM | POA: Diagnosis not present

## 2016-12-02 DIAGNOSIS — M797 Fibromyalgia: Secondary | ICD-10-CM | POA: Diagnosis not present

## 2016-12-05 DIAGNOSIS — M542 Cervicalgia: Secondary | ICD-10-CM | POA: Diagnosis not present

## 2016-12-05 DIAGNOSIS — M797 Fibromyalgia: Secondary | ICD-10-CM | POA: Diagnosis not present

## 2016-12-05 DIAGNOSIS — M5136 Other intervertebral disc degeneration, lumbar region: Secondary | ICD-10-CM | POA: Diagnosis not present

## 2016-12-08 DIAGNOSIS — M542 Cervicalgia: Secondary | ICD-10-CM | POA: Diagnosis not present

## 2016-12-08 DIAGNOSIS — M797 Fibromyalgia: Secondary | ICD-10-CM | POA: Diagnosis not present

## 2016-12-08 DIAGNOSIS — M5136 Other intervertebral disc degeneration, lumbar region: Secondary | ICD-10-CM | POA: Diagnosis not present

## 2016-12-10 DIAGNOSIS — M542 Cervicalgia: Secondary | ICD-10-CM | POA: Diagnosis not present

## 2016-12-10 DIAGNOSIS — M797 Fibromyalgia: Secondary | ICD-10-CM | POA: Diagnosis not present

## 2016-12-10 DIAGNOSIS — M5136 Other intervertebral disc degeneration, lumbar region: Secondary | ICD-10-CM | POA: Diagnosis not present

## 2016-12-15 DIAGNOSIS — M797 Fibromyalgia: Secondary | ICD-10-CM | POA: Diagnosis not present

## 2016-12-15 DIAGNOSIS — M5136 Other intervertebral disc degeneration, lumbar region: Secondary | ICD-10-CM | POA: Diagnosis not present

## 2016-12-15 DIAGNOSIS — M542 Cervicalgia: Secondary | ICD-10-CM | POA: Diagnosis not present

## 2016-12-17 DIAGNOSIS — M797 Fibromyalgia: Secondary | ICD-10-CM | POA: Diagnosis not present

## 2016-12-17 DIAGNOSIS — M542 Cervicalgia: Secondary | ICD-10-CM | POA: Diagnosis not present

## 2016-12-17 DIAGNOSIS — M5136 Other intervertebral disc degeneration, lumbar region: Secondary | ICD-10-CM | POA: Diagnosis not present

## 2016-12-19 DIAGNOSIS — Z23 Encounter for immunization: Secondary | ICD-10-CM | POA: Diagnosis not present

## 2016-12-19 DIAGNOSIS — M797 Fibromyalgia: Secondary | ICD-10-CM | POA: Diagnosis not present

## 2016-12-19 DIAGNOSIS — E785 Hyperlipidemia, unspecified: Secondary | ICD-10-CM | POA: Diagnosis not present

## 2016-12-19 DIAGNOSIS — E039 Hypothyroidism, unspecified: Secondary | ICD-10-CM | POA: Diagnosis not present

## 2016-12-19 DIAGNOSIS — Z0000 Encounter for general adult medical examination without abnormal findings: Secondary | ICD-10-CM | POA: Diagnosis not present

## 2016-12-19 DIAGNOSIS — M5136 Other intervertebral disc degeneration, lumbar region: Secondary | ICD-10-CM | POA: Diagnosis not present

## 2016-12-19 DIAGNOSIS — M542 Cervicalgia: Secondary | ICD-10-CM | POA: Diagnosis not present

## 2016-12-24 DIAGNOSIS — D224 Melanocytic nevi of scalp and neck: Secondary | ICD-10-CM | POA: Diagnosis not present

## 2016-12-24 DIAGNOSIS — M5136 Other intervertebral disc degeneration, lumbar region: Secondary | ICD-10-CM | POA: Diagnosis not present

## 2016-12-24 DIAGNOSIS — L603 Nail dystrophy: Secondary | ICD-10-CM | POA: Diagnosis not present

## 2016-12-24 DIAGNOSIS — D485 Neoplasm of uncertain behavior of skin: Secondary | ICD-10-CM | POA: Diagnosis not present

## 2016-12-24 DIAGNOSIS — L658 Other specified nonscarring hair loss: Secondary | ICD-10-CM | POA: Diagnosis not present

## 2016-12-24 DIAGNOSIS — M797 Fibromyalgia: Secondary | ICD-10-CM | POA: Diagnosis not present

## 2016-12-24 DIAGNOSIS — M542 Cervicalgia: Secondary | ICD-10-CM | POA: Diagnosis not present

## 2016-12-24 DIAGNOSIS — L218 Other seborrheic dermatitis: Secondary | ICD-10-CM | POA: Diagnosis not present

## 2016-12-25 ENCOUNTER — Other Ambulatory Visit: Payer: Self-pay | Admitting: Family Medicine

## 2016-12-25 DIAGNOSIS — M8580 Other specified disorders of bone density and structure, unspecified site: Secondary | ICD-10-CM

## 2016-12-26 DIAGNOSIS — M797 Fibromyalgia: Secondary | ICD-10-CM | POA: Diagnosis not present

## 2016-12-26 DIAGNOSIS — M5136 Other intervertebral disc degeneration, lumbar region: Secondary | ICD-10-CM | POA: Diagnosis not present

## 2016-12-26 DIAGNOSIS — M542 Cervicalgia: Secondary | ICD-10-CM | POA: Diagnosis not present

## 2017-01-05 DIAGNOSIS — L659 Nonscarring hair loss, unspecified: Secondary | ICD-10-CM | POA: Diagnosis not present

## 2017-01-06 DIAGNOSIS — M797 Fibromyalgia: Secondary | ICD-10-CM | POA: Diagnosis not present

## 2017-01-06 DIAGNOSIS — M542 Cervicalgia: Secondary | ICD-10-CM | POA: Diagnosis not present

## 2017-01-06 DIAGNOSIS — M5136 Other intervertebral disc degeneration, lumbar region: Secondary | ICD-10-CM | POA: Diagnosis not present

## 2017-01-07 DIAGNOSIS — M797 Fibromyalgia: Secondary | ICD-10-CM | POA: Diagnosis not present

## 2017-01-07 DIAGNOSIS — M542 Cervicalgia: Secondary | ICD-10-CM | POA: Diagnosis not present

## 2017-01-07 DIAGNOSIS — M5136 Other intervertebral disc degeneration, lumbar region: Secondary | ICD-10-CM | POA: Diagnosis not present

## 2017-01-16 DIAGNOSIS — M542 Cervicalgia: Secondary | ICD-10-CM | POA: Diagnosis not present

## 2017-01-16 DIAGNOSIS — M5136 Other intervertebral disc degeneration, lumbar region: Secondary | ICD-10-CM | POA: Diagnosis not present

## 2017-01-16 DIAGNOSIS — M797 Fibromyalgia: Secondary | ICD-10-CM | POA: Diagnosis not present

## 2017-01-19 DIAGNOSIS — M5136 Other intervertebral disc degeneration, lumbar region: Secondary | ICD-10-CM | POA: Diagnosis not present

## 2017-01-19 DIAGNOSIS — M542 Cervicalgia: Secondary | ICD-10-CM | POA: Diagnosis not present

## 2017-01-19 DIAGNOSIS — M797 Fibromyalgia: Secondary | ICD-10-CM | POA: Diagnosis not present

## 2017-01-20 ENCOUNTER — Ambulatory Visit
Admission: RE | Admit: 2017-01-20 | Discharge: 2017-01-20 | Disposition: A | Discharge status: Home / Self Care | Payer: 59 | Source: Ambulatory Visit | Attending: Family Medicine | Admitting: Family Medicine

## 2017-01-20 DIAGNOSIS — Z780 Asymptomatic menopausal state: Secondary | ICD-10-CM | POA: Diagnosis not present

## 2017-01-20 DIAGNOSIS — Z13820 Encounter for screening for osteoporosis: Secondary | ICD-10-CM | POA: Diagnosis not present

## 2017-01-20 DIAGNOSIS — M8580 Other specified disorders of bone density and structure, unspecified site: Secondary | ICD-10-CM

## 2017-01-21 DIAGNOSIS — D72829 Elevated white blood cell count, unspecified: Secondary | ICD-10-CM | POA: Diagnosis not present

## 2017-01-22 ENCOUNTER — Encounter: Payer: 59 | Attending: Family Medicine | Admitting: Skilled Nursing Facility1

## 2017-01-22 ENCOUNTER — Encounter: Payer: Self-pay | Admitting: Skilled Nursing Facility1

## 2017-01-22 DIAGNOSIS — Z6833 Body mass index (BMI) 33.0-33.9, adult: Secondary | ICD-10-CM | POA: Diagnosis not present

## 2017-01-22 DIAGNOSIS — E669 Obesity, unspecified: Secondary | ICD-10-CM | POA: Diagnosis not present

## 2017-01-22 DIAGNOSIS — Z713 Dietary counseling and surveillance: Secondary | ICD-10-CM | POA: Insufficient documentation

## 2017-01-22 DIAGNOSIS — M5136 Other intervertebral disc degeneration, lumbar region: Secondary | ICD-10-CM | POA: Diagnosis not present

## 2017-01-22 DIAGNOSIS — M542 Cervicalgia: Secondary | ICD-10-CM | POA: Diagnosis not present

## 2017-01-22 DIAGNOSIS — E782 Mixed hyperlipidemia: Secondary | ICD-10-CM | POA: Insufficient documentation

## 2017-01-22 DIAGNOSIS — M797 Fibromyalgia: Secondary | ICD-10-CM | POA: Diagnosis not present

## 2017-01-22 NOTE — Progress Notes (Signed)
  Medical Nutrition Therapy:  Appt start time: 10:45 end time:  11:40   Assessment:  Primary concerns today: referred for obesity. Pt states she has been steadily gaining weight due to severe back pain and not being able to move as mush as she wants. Pt states she does physical therapy 3 days a week but her insurance will not cover it in the new year. Pt states she takes care of her special needs brother who now lives in a group home. Pt states as an adult her usual weight has been. Pt states she is weaning off of her hormone replacement therapy.  Pt was clearly upset by the dietitian's education so dietitian asked what she was hoping to hear: pt states she thought she was going to be told to work out harder and more often: pt complains of severe back pain and reports when she pushes herself to workout more she has even more severe pain after attempting that fro about 3-4 days immobilizing her. Pt states she does not and will not eat any more food because it will cause her to gain more weight.   MEDICATIONS: See List   DIETARY INTAKE:  Usual eating pattern includes 1 meals and 1 snacks per day.  Everyday foods include salad.  Avoided foods include none stated.    24-hr recall: since june B ( AM): skipped---2 eggs Snk ( AM):   L ( PM): skipped---salad with grilled chicken  Snk ( PM): D ( 5 PM): salad just green leafys with salmon Snk ( PM): popcorn with olive oil or an apple Beverages: 4 cups of coffee, water, soda  Usual physical activity: Physical therapy and walking 4 miles 1 time a week  Estimated energy needs: 1500 calories 170 g carbohydrates 112 g protein 42 g fat  Progress Towards Goal(s):  In progress.    Intervention:  Nutrition counseling. Dietitian educated the pt on healthy eating.  Teaching Method Utilized: Visual Auditory Hands on  Handouts given during visit include: Meal ideas  Barriers to learning/adherence to lifestyle change: no trust in the provider  educating   Demonstrated degree of understanding via:  Teach Back   Monitoring/Evaluation:  Dietary intake, exercise, and body weight prn.

## 2017-01-26 ENCOUNTER — Ambulatory Visit: Payer: 59 | Admitting: Skilled Nursing Facility1

## 2017-01-29 DIAGNOSIS — M542 Cervicalgia: Secondary | ICD-10-CM | POA: Diagnosis not present

## 2017-01-29 DIAGNOSIS — M5136 Other intervertebral disc degeneration, lumbar region: Secondary | ICD-10-CM | POA: Diagnosis not present

## 2017-01-29 DIAGNOSIS — M797 Fibromyalgia: Secondary | ICD-10-CM | POA: Diagnosis not present

## 2017-01-30 DIAGNOSIS — M5136 Other intervertebral disc degeneration, lumbar region: Secondary | ICD-10-CM | POA: Diagnosis not present

## 2017-01-30 DIAGNOSIS — M797 Fibromyalgia: Secondary | ICD-10-CM | POA: Diagnosis not present

## 2017-01-30 DIAGNOSIS — M542 Cervicalgia: Secondary | ICD-10-CM | POA: Diagnosis not present

## 2017-02-02 DIAGNOSIS — M797 Fibromyalgia: Secondary | ICD-10-CM | POA: Diagnosis not present

## 2017-02-02 DIAGNOSIS — M542 Cervicalgia: Secondary | ICD-10-CM | POA: Diagnosis not present

## 2017-02-02 DIAGNOSIS — M5136 Other intervertebral disc degeneration, lumbar region: Secondary | ICD-10-CM | POA: Diagnosis not present

## 2017-02-03 ENCOUNTER — Telehealth: Payer: Self-pay

## 2017-02-03 DIAGNOSIS — M797 Fibromyalgia: Secondary | ICD-10-CM

## 2017-02-03 NOTE — Telephone Encounter (Signed)
Patient would like a Rx for Physical therapy for water aerobics.  Cb# is [phone].  Please advise. Thank You.

## 2017-02-03 NOTE — Addendum Note (Signed)
Addended by: Carole Binning on: 02/03/2017 03:55 PM   Modules accepted: Orders

## 2017-02-03 NOTE — Telephone Encounter (Signed)
Placed a referral for PT for Water Aerobics. Attempted to contact the patient and left message for patient to call the office.

## 2017-02-04 DIAGNOSIS — M797 Fibromyalgia: Secondary | ICD-10-CM | POA: Diagnosis not present

## 2017-02-04 DIAGNOSIS — M5136 Other intervertebral disc degeneration, lumbar region: Secondary | ICD-10-CM | POA: Diagnosis not present

## 2017-02-04 DIAGNOSIS — M542 Cervicalgia: Secondary | ICD-10-CM | POA: Diagnosis not present

## 2017-02-04 NOTE — Telephone Encounter (Signed)
Patient has been advised the referral has been placed.

## 2017-02-05 DIAGNOSIS — M542 Cervicalgia: Secondary | ICD-10-CM | POA: Diagnosis not present

## 2017-02-05 DIAGNOSIS — M5136 Other intervertebral disc degeneration, lumbar region: Secondary | ICD-10-CM | POA: Diagnosis not present

## 2017-02-05 DIAGNOSIS — M797 Fibromyalgia: Secondary | ICD-10-CM | POA: Diagnosis not present

## 2017-02-12 DIAGNOSIS — M797 Fibromyalgia: Secondary | ICD-10-CM | POA: Diagnosis not present

## 2017-02-12 DIAGNOSIS — M5136 Other intervertebral disc degeneration, lumbar region: Secondary | ICD-10-CM | POA: Diagnosis not present

## 2017-02-12 DIAGNOSIS — M542 Cervicalgia: Secondary | ICD-10-CM | POA: Diagnosis not present

## 2017-02-23 DIAGNOSIS — M6281 Muscle weakness (generalized): Secondary | ICD-10-CM | POA: Diagnosis not present

## 2017-02-23 DIAGNOSIS — M545 Low back pain: Secondary | ICD-10-CM | POA: Diagnosis not present

## 2017-02-23 DIAGNOSIS — M2560 Stiffness of unspecified joint, not elsewhere classified: Secondary | ICD-10-CM | POA: Diagnosis not present

## 2017-02-25 DIAGNOSIS — M6281 Muscle weakness (generalized): Secondary | ICD-10-CM | POA: Diagnosis not present

## 2017-02-25 DIAGNOSIS — M2560 Stiffness of unspecified joint, not elsewhere classified: Secondary | ICD-10-CM | POA: Diagnosis not present

## 2017-02-25 DIAGNOSIS — M545 Low back pain: Secondary | ICD-10-CM | POA: Diagnosis not present

## 2017-03-02 DIAGNOSIS — M6281 Muscle weakness (generalized): Secondary | ICD-10-CM | POA: Diagnosis not present

## 2017-03-02 DIAGNOSIS — M545 Low back pain: Secondary | ICD-10-CM | POA: Diagnosis not present

## 2017-03-02 DIAGNOSIS — M2560 Stiffness of unspecified joint, not elsewhere classified: Secondary | ICD-10-CM | POA: Diagnosis not present

## 2017-03-04 DIAGNOSIS — M6281 Muscle weakness (generalized): Secondary | ICD-10-CM | POA: Diagnosis not present

## 2017-03-04 DIAGNOSIS — M2560 Stiffness of unspecified joint, not elsewhere classified: Secondary | ICD-10-CM | POA: Diagnosis not present

## 2017-03-04 DIAGNOSIS — M545 Low back pain: Secondary | ICD-10-CM | POA: Diagnosis not present

## 2017-03-04 NOTE — Progress Notes (Signed)
Office Visit Note  Patient: Tracey Rodriguez             Date of Birth: 10/21/1960           MRN: [id number]             PCP: Aura Dials, MD Referring: Aura Dials, MD Visit Date: 03/18/2017 Occupation: @GUAROCC @    Subjective:  Fibromyalgia (increased pain )   History of Present Illness: Tracey Rodriguez is a 57 y.o. female with history of fibromyalgia osteoarthritis and disc disease. She states she's been getting massage therapy since last year which is been helpful. She recently started doing water therapy which is been helpful but she gets and more pain after the water therapy. She's been having increased left SI joint pain. She also describes some pain around her neck in the trapezius area.  Activities of Daily Living:  Patient reports morning stiffness for 40 minutes.   Patient Reports nocturnal pain.  Difficulty dressing/grooming: Denies Difficulty climbing stairs: Reports Difficulty getting out of chair: Reports Difficulty using hands for taps, buttons, cutlery, and/or writing: Denies   Review of Systems  Constitutional: Positive for fatigue. Negative for night sweats, weight gain, weight loss and weakness.  HENT: Negative for mouth sores, trouble swallowing, trouble swallowing, mouth dryness and nose dryness.   Eyes: Positive for dryness. Negative for pain, redness and visual disturbance.  Respiratory: Negative for cough, shortness of breath and difficulty breathing.   Cardiovascular: Negative for chest pain, palpitations, hypertension, irregular heartbeat and swelling in legs/feet.  Gastrointestinal: Negative for blood in stool, constipation and diarrhea.  Endocrine: Negative for increased urination.  Genitourinary: Negative for vaginal dryness.  Musculoskeletal: Positive for arthralgias, joint pain, myalgias, morning stiffness and myalgias. Negative for joint swelling, muscle weakness and muscle tenderness.  Skin: Negative for color change, rash, hair loss,  skin tightness, ulcers and sensitivity to sunlight.  Allergic/Immunologic: Negative for susceptible to infections.  Neurological: Negative for dizziness, memory loss and night sweats.  Hematological: Negative for swollen glands.  Psychiatric/Behavioral: Positive for sleep disturbance. Negative for depressed mood. The patient is not nervous/anxious.     PMFS History:  Patient Active Problem List   Diagnosis Date Noted  . History of breast cancer 09/11/2016  . Other fatigue 03/15/2016  . Primary insomnia 03/15/2016  . Primary osteoarthritis of both hands 03/15/2016  . DDD cervical spine 03/15/2016  . 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 History of bilateral carpal tunnel release 03/15/2016  . Unspecified hypothyroidism 03/30/2013  . Fibromyalgia 03/30/2013  . Breast cancer ([place]) 03/30/2013  . 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 Dyspnea 02/02/2013  . COPD (chronic obstructive pulmonary disease) ([place]) 02/02/2013  . Restrictive lung disease 02/02/2013    Past Medical History:  Diagnosis Date  . Arthritis    deg disc disease  - lower back, neck  . Cancer ([place])    left breast surgery-lumpectomy  . Factor V deficiency ([place])   . Fibromyalgia   . GERD (gastroesophageal reflux disease)   . Hyperlipidemia   . Hypothyroidism    goiter  . Neuromuscular disorder ([place])    sciatic nerve  . PONV (postoperative nausea and vomiting)   . SVD (spontaneous vaginal delivery)    x 2    Family History  Problem Relation Age of Onset  . Heart disease Father 5  . Heart attack Father   . Clotting disorder Father   . Rheumatologic disease Father   . Heart disease Maternal Grandfather   .  Lung cancer Maternal Grandfather   . Breast cancer Mother 4  . Liver cancer Maternal Grandmother   . Heart attack Brother   . Cancer Brother   . Breast cancer Maternal Aunt 60   Past Surgical History:  Procedure Laterality Date  . ABLATION    . APPENDECTOMY    . BREAST EXCISIONAL BIOPSY  Left   . BREAST SURGERY     left- lumpectomy  . DILATATION & CURRETTAGE/HYSTEROSCOPY WITH RESECTOCOPE N/A 04/05/2012   Procedure: hysteroscopy with endocervical curretting;  Surgeon: Allyn Kenner, DO;  Location: [facility];  Service: Gynecology;  Laterality: N/A;  . DILATION AND CURETTAGE OF UTERUS    . NOSE SURGERY     Social History   Social History Narrative  . Not on file     Objective: Vital Signs: BP 138/81 (BP Location: Left Arm, Patient Position: Sitting, Cuff Size: Normal)   Pulse 76   Resp 15   Ht 5\' 7"  (1.702 m)   Wt 217 lb (98.4 kg)   BMI 33.99 kg/m    Physical Exam  Constitutional: She is oriented to person, place, and time. She appears well-developed and well-nourished.  HENT:  Head: Normocephalic and atraumatic.  Eyes: Conjunctivae and EOM are normal.  Neck: Normal range of motion.  Cardiovascular: Normal rate, regular rhythm, normal heart sounds and intact distal pulses.  Pulmonary/Chest: Effort normal and breath sounds normal.  Abdominal: Soft. Bowel sounds are normal.  Lymphadenopathy:    She has no cervical adenopathy.  Neurological: She is alert and oriented to person, place, and time.  Skin: Skin is warm and dry. Capillary refill takes less than 2 seconds.  Psychiatric: She has a normal mood and affect. Her behavior is normal.  Nursing note and vitals reviewed.    Musculoskeletal Exam: C-spine and thoracic lumbar spine limited range of motion with no discomfort in the lumbar region. Shoulder joints, elbow joints, wrist joints, MCPs PIPs with good range of motion. She has DIP PIP thickening in her hands consistent with osteoarthritis. Hip joints knee joints ankles MTPs PIPs with good range of motion with no synovitis. She has tenderness on palpation of her left SI joint and right trochanteric bursa.  CDAI Exam: No CDAI exam completed.    Investigation: No additional findings.   Imaging: No results found.  Speciality Comments: No specialty  comments available.    Procedures:  Sacroiliac Joint Inj on 03/18/2017 11:10 AM Indications: pain Details: 27 G 1.5 in needle, posterior approach Medications: 1 mL lidocaine 1 %; 40 mg triamcinolone acetonide 40 MG/ML Aspirate: 0 mL Outcome: tolerated well, no immediate complications Procedure, treatment alternatives, risks and benefits explained, specific risks discussed. Consent was given by the patient. Immediately prior to procedure a time out was called to verify the correct patient, procedure, equipment, support staff and site/side marked as required. Patient was prepped and draped in the usual sterile fashion.     Allergies: Capsaicin; Etodolac; Bee venom; Doxycycline; Pepto-bismol [bismuth]; Pork-derived products; and Sulfonamide derivatives   Assessment / Plan:     Visit Diagnoses: Fibromyalgia: She continues to have some generalized pain and discomfort and positive tender points. She's been exercising on regular basis which is been helpful.  Primary insomnia: Good sleep hygiene was discussed.  Other fatigue: She continues to have some fatigue.  Primary osteoarthritis of both hands: Joint protection and muscle strengthening discussed.  Chronic pain of left knee: She continues to have some infrapatellar pain. Given her handout on knee exercises.  Chronic left SI  joint pain - Plan: Sacroiliac Joint Inj. She's been having a lot of discomfort in the left SI joint. After different treatment options were discussed and side effects were reviewed the left SI joint was injected as described above.  DDD (degenerative disc disease), cervical: Chronic pain  DDD (degenerative disc disease), lumbar: Chronic pain she's been going to water therapy and exercises.  Other medical problems are listed as follows:  History of bilateral carpal tunnel release  History of breast cancer  History of COPD  Factor V deficiency ([place])    Orders: Orders Placed This Encounter  Procedures  .  Sacroiliac Joint Inj   No orders of the defined types were placed in this encounter.   Face-to-face time spent with patient was 30 minutes.Greater 50% of time was spent in counseling and coordination of care.  Follow-Up Instructions: Return in about 6 months (around 09/15/2017) for FMS OA DDD.   Bo Merino, MD  Note - This record has been created using Editor, commissioning.  Chart creation errors have been sought, but may not always  have been located. Such creation errors do not reflect on  the standard of medical care.

## 2017-03-09 DIAGNOSIS — M2560 Stiffness of unspecified joint, not elsewhere classified: Secondary | ICD-10-CM | POA: Diagnosis not present

## 2017-03-09 DIAGNOSIS — M6281 Muscle weakness (generalized): Secondary | ICD-10-CM | POA: Diagnosis not present

## 2017-03-09 DIAGNOSIS — M545 Low back pain: Secondary | ICD-10-CM | POA: Diagnosis not present

## 2017-03-11 DIAGNOSIS — M2560 Stiffness of unspecified joint, not elsewhere classified: Secondary | ICD-10-CM | POA: Diagnosis not present

## 2017-03-11 DIAGNOSIS — M6281 Muscle weakness (generalized): Secondary | ICD-10-CM | POA: Diagnosis not present

## 2017-03-11 DIAGNOSIS — M545 Low back pain: Secondary | ICD-10-CM | POA: Diagnosis not present

## 2017-03-16 DIAGNOSIS — M2560 Stiffness of unspecified joint, not elsewhere classified: Secondary | ICD-10-CM | POA: Diagnosis not present

## 2017-03-16 DIAGNOSIS — M545 Low back pain: Secondary | ICD-10-CM | POA: Diagnosis not present

## 2017-03-16 DIAGNOSIS — M6281 Muscle weakness (generalized): Secondary | ICD-10-CM | POA: Diagnosis not present

## 2017-03-18 ENCOUNTER — Encounter: Payer: Self-pay | Admitting: Rheumatology

## 2017-03-18 ENCOUNTER — Ambulatory Visit (INDEPENDENT_AMBULATORY_CARE_PROVIDER_SITE_OTHER): Payer: 59 | Admitting: Rheumatology

## 2017-03-18 VITALS — BP 138/81 | HR 76 | Resp 15 | Ht 67.0 in | Wt 217.0 lb

## 2017-03-18 DIAGNOSIS — M5030 Other cervical disc degeneration, unspecified cervical region: Secondary | ICD-10-CM | POA: Diagnosis not present

## 2017-03-18 DIAGNOSIS — M5136 Other intervertebral disc degeneration, lumbar region: Secondary | ICD-10-CM | POA: Diagnosis not present

## 2017-03-18 DIAGNOSIS — M6281 Muscle weakness (generalized): Secondary | ICD-10-CM | POA: Diagnosis not present

## 2017-03-18 DIAGNOSIS — G8929 Other chronic pain: Secondary | ICD-10-CM | POA: Diagnosis not present

## 2017-03-18 DIAGNOSIS — R5383 Other fatigue: Secondary | ICD-10-CM | POA: Diagnosis not present

## 2017-03-18 DIAGNOSIS — M797 Fibromyalgia: Secondary | ICD-10-CM

## 2017-03-18 DIAGNOSIS — Z8709 Personal history of other diseases of the respiratory system: Secondary | ICD-10-CM | POA: Diagnosis not present

## 2017-03-18 DIAGNOSIS — Z9889 Other specified postprocedural states: Secondary | ICD-10-CM | POA: Diagnosis not present

## 2017-03-18 DIAGNOSIS — D682 Hereditary deficiency of other clotting factors: Secondary | ICD-10-CM

## 2017-03-18 DIAGNOSIS — Z853 Personal history of malignant neoplasm of breast: Secondary | ICD-10-CM | POA: Diagnosis not present

## 2017-03-18 DIAGNOSIS — M19042 Primary osteoarthritis, left hand: Secondary | ICD-10-CM

## 2017-03-18 DIAGNOSIS — M533 Sacrococcygeal disorders, not elsewhere classified: Secondary | ICD-10-CM | POA: Diagnosis not present

## 2017-03-18 DIAGNOSIS — M25562 Pain in left knee: Secondary | ICD-10-CM

## 2017-03-18 DIAGNOSIS — M19041 Primary osteoarthritis, right hand: Secondary | ICD-10-CM

## 2017-03-18 DIAGNOSIS — F5101 Primary insomnia: Secondary | ICD-10-CM | POA: Diagnosis not present

## 2017-03-18 DIAGNOSIS — M545 Low back pain: Secondary | ICD-10-CM | POA: Diagnosis not present

## 2017-03-18 DIAGNOSIS — M2560 Stiffness of unspecified joint, not elsewhere classified: Secondary | ICD-10-CM | POA: Diagnosis not present

## 2017-03-18 MED ORDER — LIDOCAINE HCL 1 % IJ SOLN
1.0000 mL | INTRAMUSCULAR | Status: AC | PRN
  Administered 2017-03-18: 1 mL

## 2017-03-18 MED ORDER — TRIAMCINOLONE ACETONIDE 40 MG/ML IJ SUSP
40.0000 mg | INTRAMUSCULAR | Status: AC | PRN
  Administered 2017-03-18: 40 mg via INTRA_ARTICULAR

## 2017-03-18 NOTE — Patient Instructions (Signed)

## 2017-03-23 DIAGNOSIS — M2560 Stiffness of unspecified joint, not elsewhere classified: Secondary | ICD-10-CM | POA: Diagnosis not present

## 2017-03-23 DIAGNOSIS — M545 Low back pain: Secondary | ICD-10-CM | POA: Diagnosis not present

## 2017-03-23 DIAGNOSIS — M6281 Muscle weakness (generalized): Secondary | ICD-10-CM | POA: Diagnosis not present

## 2017-03-25 DIAGNOSIS — J209 Acute bronchitis, unspecified: Secondary | ICD-10-CM | POA: Diagnosis not present

## 2017-03-25 DIAGNOSIS — R509 Fever, unspecified: Secondary | ICD-10-CM | POA: Diagnosis not present

## 2017-03-25 DIAGNOSIS — J029 Acute pharyngitis, unspecified: Secondary | ICD-10-CM | POA: Diagnosis not present

## 2017-04-06 DIAGNOSIS — M2560 Stiffness of unspecified joint, not elsewhere classified: Secondary | ICD-10-CM | POA: Diagnosis not present

## 2017-04-06 DIAGNOSIS — M6281 Muscle weakness (generalized): Secondary | ICD-10-CM | POA: Diagnosis not present

## 2017-04-06 DIAGNOSIS — M545 Low back pain: Secondary | ICD-10-CM | POA: Diagnosis not present

## 2017-04-08 DIAGNOSIS — M2560 Stiffness of unspecified joint, not elsewhere classified: Secondary | ICD-10-CM | POA: Diagnosis not present

## 2017-04-08 DIAGNOSIS — M6281 Muscle weakness (generalized): Secondary | ICD-10-CM | POA: Diagnosis not present

## 2017-04-08 DIAGNOSIS — M545 Low back pain: Secondary | ICD-10-CM | POA: Diagnosis not present

## 2017-04-13 DIAGNOSIS — M2560 Stiffness of unspecified joint, not elsewhere classified: Secondary | ICD-10-CM | POA: Diagnosis not present

## 2017-04-13 DIAGNOSIS — M6281 Muscle weakness (generalized): Secondary | ICD-10-CM | POA: Diagnosis not present

## 2017-04-13 DIAGNOSIS — M545 Low back pain: Secondary | ICD-10-CM | POA: Diagnosis not present

## 2017-04-16 DIAGNOSIS — M545 Low back pain: Secondary | ICD-10-CM | POA: Diagnosis not present

## 2017-04-16 DIAGNOSIS — M2560 Stiffness of unspecified joint, not elsewhere classified: Secondary | ICD-10-CM | POA: Diagnosis not present

## 2017-04-16 DIAGNOSIS — M6281 Muscle weakness (generalized): Secondary | ICD-10-CM | POA: Diagnosis not present

## 2017-04-22 DIAGNOSIS — M6281 Muscle weakness (generalized): Secondary | ICD-10-CM | POA: Diagnosis not present

## 2017-04-22 DIAGNOSIS — M545 Low back pain: Secondary | ICD-10-CM | POA: Diagnosis not present

## 2017-04-22 DIAGNOSIS — M2560 Stiffness of unspecified joint, not elsewhere classified: Secondary | ICD-10-CM | POA: Diagnosis not present

## 2017-04-23 DIAGNOSIS — M2560 Stiffness of unspecified joint, not elsewhere classified: Secondary | ICD-10-CM | POA: Diagnosis not present

## 2017-04-23 DIAGNOSIS — M6281 Muscle weakness (generalized): Secondary | ICD-10-CM | POA: Diagnosis not present

## 2017-04-23 DIAGNOSIS — M545 Low back pain: Secondary | ICD-10-CM | POA: Diagnosis not present

## 2017-04-27 DIAGNOSIS — M545 Low back pain: Secondary | ICD-10-CM | POA: Diagnosis not present

## 2017-04-27 DIAGNOSIS — M2560 Stiffness of unspecified joint, not elsewhere classified: Secondary | ICD-10-CM | POA: Diagnosis not present

## 2017-04-27 DIAGNOSIS — M6281 Muscle weakness (generalized): Secondary | ICD-10-CM | POA: Diagnosis not present

## 2017-04-30 DIAGNOSIS — M6281 Muscle weakness (generalized): Secondary | ICD-10-CM | POA: Diagnosis not present

## 2017-04-30 DIAGNOSIS — M545 Low back pain: Secondary | ICD-10-CM | POA: Diagnosis not present

## 2017-04-30 DIAGNOSIS — M2560 Stiffness of unspecified joint, not elsewhere classified: Secondary | ICD-10-CM | POA: Diagnosis not present

## 2017-05-04 DIAGNOSIS — M545 Low back pain: Secondary | ICD-10-CM | POA: Diagnosis not present

## 2017-05-04 DIAGNOSIS — M2560 Stiffness of unspecified joint, not elsewhere classified: Secondary | ICD-10-CM | POA: Diagnosis not present

## 2017-05-04 DIAGNOSIS — M6281 Muscle weakness (generalized): Secondary | ICD-10-CM | POA: Diagnosis not present

## 2017-05-07 DIAGNOSIS — M545 Low back pain: Secondary | ICD-10-CM | POA: Diagnosis not present

## 2017-05-07 DIAGNOSIS — M6281 Muscle weakness (generalized): Secondary | ICD-10-CM | POA: Diagnosis not present

## 2017-05-07 DIAGNOSIS — M2560 Stiffness of unspecified joint, not elsewhere classified: Secondary | ICD-10-CM | POA: Diagnosis not present

## 2017-05-11 DIAGNOSIS — M6281 Muscle weakness (generalized): Secondary | ICD-10-CM | POA: Diagnosis not present

## 2017-05-11 DIAGNOSIS — M2560 Stiffness of unspecified joint, not elsewhere classified: Secondary | ICD-10-CM | POA: Diagnosis not present

## 2017-05-11 DIAGNOSIS — M545 Low back pain: Secondary | ICD-10-CM | POA: Diagnosis not present

## 2017-05-13 DIAGNOSIS — M545 Low back pain: Secondary | ICD-10-CM | POA: Diagnosis not present

## 2017-05-13 DIAGNOSIS — M6281 Muscle weakness (generalized): Secondary | ICD-10-CM | POA: Diagnosis not present

## 2017-05-13 DIAGNOSIS — M2560 Stiffness of unspecified joint, not elsewhere classified: Secondary | ICD-10-CM | POA: Diagnosis not present

## 2017-06-01 DIAGNOSIS — E559 Vitamin D deficiency, unspecified: Secondary | ICD-10-CM | POA: Diagnosis not present

## 2017-06-01 DIAGNOSIS — Z13220 Encounter for screening for lipoid disorders: Secondary | ICD-10-CM | POA: Diagnosis not present

## 2017-06-01 DIAGNOSIS — R5383 Other fatigue: Secondary | ICD-10-CM | POA: Diagnosis not present

## 2017-06-01 DIAGNOSIS — R635 Abnormal weight gain: Secondary | ICD-10-CM | POA: Diagnosis not present

## 2017-06-01 DIAGNOSIS — E8881 Metabolic syndrome: Secondary | ICD-10-CM | POA: Diagnosis not present

## 2017-06-01 DIAGNOSIS — Z131 Encounter for screening for diabetes mellitus: Secondary | ICD-10-CM | POA: Diagnosis not present

## 2017-06-01 DIAGNOSIS — E6609 Other obesity due to excess calories: Secondary | ICD-10-CM | POA: Diagnosis not present

## 2017-06-03 DIAGNOSIS — E7800 Pure hypercholesterolemia, unspecified: Secondary | ICD-10-CM | POA: Insufficient documentation

## 2017-06-09 DIAGNOSIS — J029 Acute pharyngitis, unspecified: Secondary | ICD-10-CM | POA: Diagnosis not present

## 2017-07-06 DIAGNOSIS — J069 Acute upper respiratory infection, unspecified: Secondary | ICD-10-CM | POA: Diagnosis not present

## 2017-07-14 DIAGNOSIS — R0981 Nasal congestion: Secondary | ICD-10-CM | POA: Diagnosis not present

## 2017-07-15 DIAGNOSIS — E6609 Other obesity due to excess calories: Secondary | ICD-10-CM | POA: Diagnosis not present

## 2017-07-15 DIAGNOSIS — E8881 Metabolic syndrome: Secondary | ICD-10-CM | POA: Diagnosis not present

## 2017-07-15 DIAGNOSIS — K760 Fatty (change of) liver, not elsewhere classified: Secondary | ICD-10-CM | POA: Diagnosis not present

## 2017-07-17 ENCOUNTER — Telehealth: Payer: Self-pay | Admitting: Emergency Medicine

## 2017-07-17 NOTE — Telephone Encounter (Signed)
Called and spoke to patient. Patient has been seen here since 2014 but is re-establishing 6/28 with RB. Patient wanted advice on what to take for cough and reports she is already on several medications from PCP. Advised patient to follow up with PCP as we can not suggest anything until we have seen her for next appointment to re-establish. Patient stated she will follow up with PCP. Nothing further is needed at this time.

## 2017-07-21 DIAGNOSIS — R490 Dysphonia: Secondary | ICD-10-CM | POA: Diagnosis not present

## 2017-07-21 DIAGNOSIS — K219 Gastro-esophageal reflux disease without esophagitis: Secondary | ICD-10-CM | POA: Diagnosis not present

## 2017-07-22 ENCOUNTER — Encounter: Payer: Self-pay | Admitting: Gastroenterology

## 2017-07-27 DIAGNOSIS — R05 Cough: Secondary | ICD-10-CM | POA: Diagnosis not present

## 2017-07-27 DIAGNOSIS — J3489 Other specified disorders of nose and nasal sinuses: Secondary | ICD-10-CM | POA: Diagnosis not present

## 2017-07-27 DIAGNOSIS — J383 Other diseases of vocal cords: Secondary | ICD-10-CM | POA: Diagnosis not present

## 2017-07-28 ENCOUNTER — Other Ambulatory Visit: Payer: Self-pay | Admitting: Otolaryngology

## 2017-07-28 DIAGNOSIS — R490 Dysphonia: Secondary | ICD-10-CM

## 2017-07-29 ENCOUNTER — Encounter: Payer: Self-pay | Admitting: Gastroenterology

## 2017-07-29 ENCOUNTER — Encounter: Payer: Self-pay | Admitting: Internal Medicine

## 2017-07-29 ENCOUNTER — Ambulatory Visit (INDEPENDENT_AMBULATORY_CARE_PROVIDER_SITE_OTHER): Payer: 59 | Admitting: Gastroenterology

## 2017-07-29 VITALS — BP 142/86 | HR 80 | Temp 98.6°F | Ht 65.0 in | Wt 208.0 lb

## 2017-07-29 DIAGNOSIS — R490 Dysphonia: Secondary | ICD-10-CM | POA: Diagnosis not present

## 2017-07-29 DIAGNOSIS — K219 Gastro-esophageal reflux disease without esophagitis: Secondary | ICD-10-CM | POA: Diagnosis not present

## 2017-07-29 NOTE — Patient Instructions (Addendum)
If you are age 57 or older, your body mass index should be between 23-30. Your Body mass index is 34.61 kg/m. If this is out of the aforementioned range listed, please consider follow up with your Primary Care Provider.  If you are age 57 or younger, your body mass index should be between 19-25. Your Body mass index is 34.61 kg/m. If this is out of the aformentioned range listed, please consider follow up with your Primary Care Provider.   You have been scheduled for an endoscopy. Please follow written instructions given to you at your visit today. If you use inhalers (even only as needed), please bring them with you on the day of your procedure. Your physician has requested that you go to www.startemmi.com and enter the access code given to you at your visit today. This web site gives a general overview about your procedure. However, you should still follow specific instructions given to you by our office regarding your preparation for the procedure.    Thank you.

## 2017-07-29 NOTE — Progress Notes (Addendum)
07/29/2017 Tracey Rodriguez [id number] 06/16/1960   HISTORY OF PRESENT ILLNESS:  This is a 57 year old female who is a patient of Dr. Celesta Aver.  She has not been seen here since 03/2013.  Has history of GERD and EGD in 2005 showed antral erosions that were thought to be possibly NSAID related.  She presents to our office today with primary complaint of hoarseness.  This has been present for the past 2 or 3 months.  Says that she has been treated for URI with antibiotics, steroids, etc, and then the steroids really kicked up her reflux.  Denies any dysphagia, but feels like her esophagus is thick and swollen, making it hard to swallow.  Saw ENT earlier this week and reports that they told her that she has vocal cord dysfunction, although I do not see that diagnosis in their note.  They increased her Nexium to BID (had previously been on once daily).  She also reports fatigue for several months.  Reports occasional cough.  Has been using NSAID's again fairly regularly for her fibromyalgia.     Past Medical History:  Diagnosis Date  . Arthritis    deg disc disease  - lower back, neck  . Cancer ([place])    left breast surgery-lumpectomy  . Factor V deficiency ([place])   . Fibromyalgia   . GERD (gastroesophageal reflux disease)   . Hyperlipidemia   . Hypothyroidism    goiter  . Neuromuscular disorder ([place])    sciatic nerve  . PONV (postoperative nausea and vomiting)   . SVD (spontaneous vaginal delivery)    x 2   Past Surgical History:  Procedure Laterality Date  . ABLATION    . APPENDECTOMY    . BREAST EXCISIONAL BIOPSY Left   . BREAST SURGERY     left- lumpectomy  . DILATATION & CURRETTAGE/HYSTEROSCOPY WITH RESECTOCOPE N/A 04/05/2012   Procedure: hysteroscopy with endocervical curretting;  Surgeon: Allyn Kenner, DO;  Location: [facility];  Service: Gynecology;  Laterality: N/A;  . DILATION AND CURETTAGE OF UTERUS    . NOSE SURGERY      reports that she quit smoking about 17 years  ago. Her smoking use included cigarettes. She has a 66.00 pack-year smoking history. She has never used smokeless tobacco. She reports that she drinks alcohol. She reports that she does not use drugs. family history includes Breast cancer (age of onset: 85) in her maternal aunt; Breast cancer (age of onset: 66) in her mother; Cancer in her brother; Clotting disorder in her father; Colon polyps in her brother and brother; Crohn's disease in her father; Heart attack in her brother and father; Heart disease in her maternal grandfather; Heart disease (age of onset: 15) in her father; Irritable bowel syndrome in her son; Kidney disease in her paternal uncle; Liver cancer in her maternal grandmother; Lung cancer in her brother and maternal grandfather; Ovarian cancer in her mother; Rheumatologic disease in her father. Allergies  Allergen Reactions  . Capsaicin Anaphylaxis    Pt not sure of name   . Etodolac Shortness Of Breath    REACTION: dyspnea/tightness in chest  . Bee Venom   . Doxycycline Nausea And Vomiting  . Pepto-Bismol [Bismuth] Nausea And Vomiting  . Pork-Derived Products Nausea And Vomiting  . Sulfonamide Derivatives Nausea And Vomiting    REACTION: vomiting      Outpatient Encounter Medications as of 07/29/2017  Medication Sig  . albuterol (PROVENTIL HFA;VENTOLIN HFA) 108 (90 BASE) MCG/ACT inhaler Inhale  2 puffs into the lungs every 6 (six) hours as needed for wheezing or shortness of breath.  Marland Kitchen aspirin 81 MG tablet Take 81 mg by mouth daily.  . diclofenac sodium (VOLTAREN) 1 % GEL Apply 1 application topically daily as needed (arthritis).  . Doxycycline Hyclate (DORYX) 200 MG TBEC Take 200 mg by mouth daily as needed (skin inflammation).  Marland Kitchen doxylamine, Sleep, (UNISOM) 25 MG tablet Take 25 mg by mouth at bedtime as needed.  Marland Kitchen esomeprazole (NEXIUM) 40 MG capsule Take 1 capsule (40 mg total) by mouth daily.  Marland Kitchen estrogens-methylTEST (ESTRATEST) 1.25-2.5 MG tablet Take 1 tablet by mouth  daily.  . fluocinonide ointment (LIDEX) 1.51 % Apply 1 application topically 2 (two) times daily.  . furosemide (LASIX) 20 MG tablet Take 20 mg by mouth daily as needed for fluid or edema.  Marland Kitchen levothyroxine (SYNTHROID, LEVOTHROID) 50 MCG tablet Take 50 mcg by mouth daily.  . medroxyPROGESTERone (PROVERA) 2.5 MG tablet Take 2.5 mg by mouth daily.  . mirabegron ER (MYRBETRIQ) 50 MG TB24 Take 50 mg by mouth daily.  . mupirocin ointment (BACTROBAN) 2 % Place 1 application into the nose daily as needed (blood blisters).  Marland Kitchen OVER THE COUNTER MEDICATION Take 2 capsules by mouth daily. Med Name: STRESS B COMPLEX CAPSULE   No facility-administered encounter medications on file as of 07/29/2017.      REVIEW OF SYSTEMS  : All other systems reviewed and negative except where noted in the History of Present Illness.   PHYSICAL EXAM: Ht 5\' 5"  (1.651 m)   Wt 208 lb (94.3 kg)   BMI 34.61 kg/m  General: Well developed white female in no acute distress Head: Normocephalic and atraumatic Eyes:  Sclerae anicteric, conjunctiva pink. Ears: Normal auditory acuity Lungs: Clear throughout to auscultation; no W/R/R. Heart: Regular rate and rhythm; no M/R/G. Abdomen: Soft, non-distended.  BS present.  Non-tender. Musculoskeletal: Symmetrical with no gross deformities  Skin: No lesions on visible extremities Extremities: No edema  Neurological: Alert oriented x 4, grossly non-focal Psychological:  Alert and cooperative. Normal mood and affect  ASSESSMENT AND PLAN: *Hoarseness and GERD:  Saw ENT recently.  Reports "vocal cord dysfunction," however, I do not see that actual diagnosis in their note.  Also ordered a thyroid ultrasound and increased PPI to BID, which I think is appropriate for now.  Will schedule for EGD with Dr. Carlean Purl.  **The risks, benefits, and alternatives to EGD were discussed with the patient and she consents to proceed.   CC:  Aura Dials, MD  Agree with Ms. Penny Arrambide's  management.  Gatha Mayer, MD, [facility]

## 2017-07-30 DIAGNOSIS — R490 Dysphonia: Secondary | ICD-10-CM | POA: Insufficient documentation

## 2017-07-30 DIAGNOSIS — K219 Gastro-esophageal reflux disease without esophagitis: Secondary | ICD-10-CM | POA: Insufficient documentation

## 2017-08-03 ENCOUNTER — Ambulatory Visit
Admission: RE | Admit: 2017-08-03 | Discharge: 2017-08-03 | Disposition: A | Discharge status: Home / Self Care | Payer: 59 | Source: Ambulatory Visit | Attending: Otolaryngology | Admitting: Otolaryngology

## 2017-08-03 DIAGNOSIS — R490 Dysphonia: Secondary | ICD-10-CM

## 2017-08-03 DIAGNOSIS — E041 Nontoxic single thyroid nodule: Secondary | ICD-10-CM | POA: Diagnosis not present

## 2017-08-05 DIAGNOSIS — Z01419 Encounter for gynecological examination (general) (routine) without abnormal findings: Secondary | ICD-10-CM | POA: Diagnosis not present

## 2017-08-05 DIAGNOSIS — Z124 Encounter for screening for malignant neoplasm of cervix: Secondary | ICD-10-CM | POA: Diagnosis not present

## 2017-08-05 DIAGNOSIS — Z1231 Encounter for screening mammogram for malignant neoplasm of breast: Secondary | ICD-10-CM | POA: Diagnosis not present

## 2017-08-05 NOTE — Progress Notes (Signed)
Office Visit Note  Patient: Tracey Rodriguez             Date of Birth: January 14, 1961           MRN: [id number]             PCP: Aura Dials, MD Referring: Aura Dials, MD Visit Date: 08/10/2017 Occupation: @GUAROCC @    Subjective:  Fatigue  History of Present Illness: Tracey Rodriguez is a 57 y.o. female with history of fibromyalgia, DDD, and osteoarthritis.  Patient states in April 2019 she started having fever and sore throat.  She was treated with 3 courses of antibiotics and 2 courses of steroids without results.  She was referred to ENT where she had sinus x-rays which were negative.  ENT felt that she has vocal cord dysfunction.  She still has low-grade fevers.  She had some recent labs done by her PCP which were negative.  She continues to have increased fatigue.  She states she has been having discomfort in her SI joint for a while.  She would like to have a cortisone injection there.  She continues to have stiffness and discomfort in her hands.  Activities of Daily Living:  Patient reports morning stiffness for 40 minutes.   Patient Reports nocturnal pain.  Difficulty dressing/grooming: Reports Difficulty climbing stairs: Reports Difficulty getting out of chair: Reports Difficulty using hands for taps, buttons, cutlery, and/or writing: Reports   Review of Systems  Constitutional: Positive for fatigue and weight gain.  HENT: Negative for mouth sores, mouth dryness and nose dryness.   Eyes: Positive for dryness. Negative for pain and visual disturbance.  Respiratory: Negative for cough, hemoptysis, shortness of breath and difficulty breathing.   Cardiovascular: Negative for chest pain, palpitations, hypertension and swelling in legs/feet.  Gastrointestinal: Negative for abdominal pain, blood in stool, constipation and diarrhea.  Endocrine: Negative for increased urination.  Genitourinary: Negative for painful urination and pelvic pain.  Musculoskeletal: Positive for  arthralgias, joint pain and morning stiffness. Negative for joint swelling, myalgias, muscle weakness, muscle tenderness and myalgias.  Skin: Negative for color change, pallor, rash, hair loss, nodules/bumps, skin tightness, ulcers and sensitivity to sunlight.  Allergic/Immunologic: Negative for susceptible to infections.  Neurological: Negative for dizziness, light-headedness, numbness and memory loss.  Hematological: Negative for swollen glands.  Psychiatric/Behavioral: Negative for depressed mood, confusion and sleep disturbance. The patient is not nervous/anxious.     PMFS History:  Patient Active Problem List   Diagnosis Date Noted  . Hoarseness 07/30/2017  . Gastroesophageal reflux disease 07/30/2017  . History of breast cancer 09/11/2016  . Other fatigue 03/15/2016  . Primary insomnia 03/15/2016  . Primary osteoarthritis of both hands 03/15/2016  . DDD cervical spine 03/15/2016  . 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 History of bilateral carpal tunnel release 03/15/2016  . Unspecified hypothyroidism 03/30/2013  . Fibromyalgia 03/30/2013  . Breast cancer ([place]) 03/30/2013  . Factor V deficiency ([place]) 03/30/2013  . Dyspnea 02/02/2013  . COPD (chronic obstructive pulmonary disease) ([place]) 02/02/2013  . Restrictive lung disease 02/02/2013    Past Medical History:  Diagnosis Date  . Arthritis    deg disc disease  - lower back, neck  . Cancer ([place])    left breast surgery-lumpectomy  . Factor V deficiency ([place])   . Fibromyalgia   . GERD (gastroesophageal reflux disease)   . Heart murmur   . Hyperlipidemia   . Hypothyroidism    goiter  . Neuromuscular disorder ([place])  sciatic nerve  . PONV (postoperative nausea and vomiting)   . SVD (spontaneous vaginal delivery)    x 2    Family History  Problem Relation Age of Onset  . Heart disease Father 49  . Heart attack Father   . Clotting disorder Father   . Rheumatologic disease Father   . Crohn's disease Father     . Heart disease Maternal Grandfather   . Lung cancer Maternal Grandfather   . Breast cancer Mother 58  . Ovarian cancer Mother   . Liver cancer Maternal Grandmother   . Heart attack Brother   . Cancer Brother   . Lung cancer Brother   . Colon polyps Brother   . Colon polyps Brother   . Breast cancer Maternal Aunt 60  . Kidney disease Paternal Uncle   . Irritable bowel syndrome Son    Past Surgical History:  Procedure Laterality Date  . ABLATION    . APPENDECTOMY    . BREAST EXCISIONAL BIOPSY Left   . BREAST SURGERY     left- lumpectomy  . DILATATION & CURRETTAGE/HYSTEROSCOPY WITH RESECTOCOPE N/A 04/05/2012   Procedure: hysteroscopy with endocervical curretting;  Surgeon: Allyn Kenner, DO;  Location: [facility];  Service: Gynecology;  Laterality: N/A;  . DILATION AND CURETTAGE OF UTERUS    . NOSE SURGERY     Social History   Social History Narrative  . Not on file     Objective: Vital Signs: BP (!) 152/83 (BP Location: Left Arm, Patient Position: Sitting, Cuff Size: Normal)   Pulse 80   Resp 15   Ht 5\' 6"  (1.676 m)   Wt 211 lb (95.7 kg)   BMI 34.06 kg/m    Physical Exam  Constitutional: She is oriented to person, place, and time. She appears well-developed and well-nourished.  HENT:  Head: Normocephalic and atraumatic.  Eyes: Conjunctivae and EOM are normal.  Neck: Normal range of motion.  Cardiovascular: Normal rate, regular rhythm, normal heart sounds and intact distal pulses.  Pulmonary/Chest: Effort normal and breath sounds normal.  Abdominal: Soft. Bowel sounds are normal.  Lymphadenopathy:    She has no cervical adenopathy.  Neurological: She is alert and oriented to person, place, and time.  Skin: Skin is warm and dry. Capillary refill takes less than 2 seconds.  Psychiatric: She has a normal mood and affect. Her behavior is normal.  Nursing note and vitals reviewed.    Musculoskeletal Exam: C-spine thoracic lumbar spine limited range of motion.  She  had discomfort on palpation over SI joints.  Hip joints knee joints ankles MTPs PIPs been good range of motion.  Shoulder joints elbow joints wrist joints were in good range of motion.  She has some DIP PIP thickening in her hands.  No synovitis was noted.  She has some generalized hyperalgesia.  CDAI Exam: No CDAI exam completed.    Investigation: No additional findings.   Imaging: US Thyroid  Result Date: 08/04/2017 CLINICAL DATA:  57 year old female with a history of hoarseness EXAM: THYROID ULTRASOUND TECHNIQUE: Ultrasound examination of the thyroid gland and adjacent soft tissues was performed. COMPARISON:  09/03/2010, 02/27/2010 FINDINGS: Parenchymal Echotexture: Moderately heterogenous Isthmus: 0.3 cm Right lobe: 4.2 cm x 1.3 cm x 1.2 cm Left lobe: 4.0 cm x 1.2 cm x 1.1 cm _________________________________________________________ Estimated total number of nodules >/= 1 cm: 1 Number of spongiform nodules >/=  2 cm not described below (TR1): 0 Number of mixed cystic and solid nodules >/= 1.5 cm not described below (  [place]): 0 _________________________________________________________ Nodule # 1: Location: Right; Inferior Maximum size: 0.7 cm; Other 2 dimensions: 0.4 cm x 0.5 cm Composition: cannot determine (2) Echogenicity: hypoechoic (2) Shape: not taller-than-wide (0) Margins: ill-defined (0) Echogenic foci: none (0) ACR TI-RADS total points: 4. ACR TI-RADS risk category: TR4 (4-6 points). ACR TI-RADS recommendations: Nodule does not meet criteria for surveillance or biopsy _________________________________________________________ Nodule # 2: Location: Left; Superior Maximum size: 0.6 cm; Other 2 dimensions: 0.5 cm x 0.5 cm Composition: cannot determine (2) Echogenicity: hypoechoic (2) Shape: not taller-than-wide (0) Margins: ill-defined (0) Echogenic foci: none (0) ACR TI-RADS total points: 4. ACR TI-RADS risk category: TR4 (4-6 points). ACR TI-RADS recommendations: Nodule does not meet criteria  for surveillance or biopsy _________________________________________________________ Nodule # 3: Location: Left; Mid Maximum size: 0.6 cm; Other 2 dimensions: 0.4 cm x 0.5 cm Composition: cannot determine (2) Echogenicity: hypoechoic (2) Shape: not taller-than-wide (0) Margins: ill-defined (0) Echogenic foci: none (0) ACR TI-RADS total points: 4. ACR TI-RADS risk category: TR4 (4-6 points). ACR TI-RADS recommendations: Nodule does not meet criteria for surveillance or biopsy _________________________________________________________ Nodule # 4: Location: Left; Mid Maximum size: 1.5 cm; Other 2 dimensions: 0.6 cm x 0.8 cm Composition: cannot determine (2) Echogenicity: isoechoic (1) Shape: not taller-than-wide (0) Margins: ill-defined (0) Echogenic foci: none (0) ACR TI-RADS total points: 3. ACR TI-RADS risk category: TR3 (3 points). ACR TI-RADS recommendations: Nodule has decreased in size over 5 years, meeting criteria for benign nodule, with characteristics not meeting criteria for surveillance or biopsy. _________________________________________________________ Nodule # 5: Location: Left; Inferior Maximum size: 0.5 cm; Other 2 dimensions: 0.4 cm x 0.6 cm Composition: cannot determine (2) Echogenicity: hypoechoic (2) Shape: not taller-than-wide (0) Margins: ill-defined (0) Echogenic foci: none (0) ACR TI-RADS total points: 4. ACR TI-RADS risk category: TR4 (4-6 points). ACR TI-RADS recommendations: Nodule does not meet criteria for surveillance or biopsy _________________________________________________________ No adenopathy IMPRESSION: No thyroid nodule meets criteria for biopsy or surveillance, as designated by the newly established ACR TI-RADS criteria. Recommendations follow those established by the new ACR TI-RADS criteria (J Am Coll Radiol 5956;38:756-433). Electronically Signed   By: Corrie Mckusick D.O.   On: 08/04/2017 08:15    Speciality Comments: No specialty comments available.    Procedures:    Sacroiliac Joint Inj on 08/10/2017 10:23 AM Indications: pain Details: 27 G 1.5 in needle, posterior approach Medications: 1 mL lidocaine 1 %; 40 mg triamcinolone acetonide 40 MG/ML Aspirate: 0 mL Outcome: tolerated well, no immediate complications Procedure, treatment alternatives, risks and benefits explained, specific risks discussed. Consent was given by the patient. Immediately prior to procedure a time out was called to verify the correct patient, procedure, equipment, support staff and site/side marked as required. Patient was prepped and draped in the usual sterile fashion.     Allergies: Capsaicin; Etodolac; Bee venom; Doxycycline; Pepto-bismol [bismuth]; Pork-derived products; and Sulfonamide derivatives   Assessment / Plan:     Visit Diagnoses: Chronic SI joint pain-she has been having increased SI joint discomfort.  She has been having difficulty walking and nocturnal pain.  She had good response to cortisone injections in the past.  She requests cortisone injection to her left SI joint.  Her blood pressure was slightly elevated today.  Have advised her to monitor blood pressure closely after her injection.  Per her request after informed consent was obtained and different treatment options were discussed the left SI joint was injected as described above.  She tolerated the procedure well.  Fibromyalgia-she continues to have  generalized pain and discomfort from fibromyalgia.  She has positive tender points.  Other fatigue-she has been experiencing increased fatigue after the recent illness.  Primary insomnia-good sleep hygiene was discussed.  Primary osteoarthritis of both hands-she continues to have a stiffness in her hands.  DDD (degenerative disc disease), cervical-she has limited range of motion of her cervical spine.  DDD (degenerative disc disease), lumbar-she has any range of her lumbar spine.  Other medical problems are listed as follows:  History of breast  cancer  History of bilateral carpal tunnel release  Factor V deficiency ([place])  History of COPD-patient has appointment coming up with pulmonologist.   Orders: Orders Placed This Encounter  Procedures  . Sacroiliac Joint Inj   No orders of the defined types were placed in this encounter.   Face-to-face time spent with patient was 30 minutes. >50% of time was spent in counseling and coordination of care.  Follow-Up Instructions: Return for Fibromyalgia, Osteoarthritis, DDD.   Bo Merino, MD  Note - This record has been created using Editor, commissioning.  Chart creation errors have been sought, but may not always  have been located. Such creation errors do not reflect on  the standard of medical care.

## 2017-08-06 ENCOUNTER — Encounter: Payer: Self-pay | Admitting: Internal Medicine

## 2017-08-06 ENCOUNTER — Ambulatory Visit (AMBULATORY_SURGERY_CENTER): Payer: 59 | Admitting: Internal Medicine

## 2017-08-06 ENCOUNTER — Other Ambulatory Visit: Payer: Self-pay

## 2017-08-06 VITALS — BP 161/91 | HR 75 | Temp 99.8°F | Resp 27 | Ht 65.0 in | Wt 208.0 lb

## 2017-08-06 DIAGNOSIS — R490 Dysphonia: Secondary | ICD-10-CM | POA: Diagnosis present

## 2017-08-06 DIAGNOSIS — R1310 Dysphagia, unspecified: Secondary | ICD-10-CM

## 2017-08-06 DIAGNOSIS — R5383 Other fatigue: Secondary | ICD-10-CM | POA: Diagnosis not present

## 2017-08-06 MED ORDER — ESOMEPRAZOLE MAGNESIUM 40 MG PO CPDR: 40.0000 mg | DELAYED_RELEASE_CAPSULE | Freq: Two times a day (BID) | ORAL | 1 refills | Status: DC

## 2017-08-06 MED ORDER — SODIUM CHLORIDE 0.9 % IV SOLN: 500.0000 mL | Freq: Once | INTRAVENOUS | Status: DC

## 2017-08-06 NOTE — Progress Notes (Signed)
Called to room to assist during endoscopic procedure.  Patient ID and intended procedure confirmed with present staff. Received instructions for my participation in the procedure from the performing physician.  

## 2017-08-06 NOTE — Op Note (Signed)
[facility] Patient Name: Tracey Rodriguez Procedure Date: 08/06/2017 8:34 AM MRN: [id number] Endoscopist: Gatha Mayer , MD Age: 57 Referring MD:  Date of Birth: [birth year] Gender: Female Account #: [id number] Procedure:                Upper GI endoscopy Indications:              Dysphagia, Hoarseness Medicines:                Propofol per Anesthesia, Monitored Anesthesia Care Procedure:                Pre-Anesthesia Assessment:                           - Prior to the procedure, a History and Physical                            was performed, and patient medications and                            allergies were reviewed. The patient's tolerance of                            previous anesthesia was also reviewed. The risks                            and benefits of the procedure and the sedation                            options and risks were discussed with the patient.                            All questions were answered, and informed consent                            was obtained. Prior Anticoagulants: The patient has                            taken no previous anticoagulant or antiplatelet                            agents. ASA Grade Assessment: II - A patient with                            mild systemic disease. After reviewing the risks                            and benefits, the patient was deemed in                            satisfactory condition to undergo the procedure.                           After obtaining informed consent, the endoscope was  passed under direct vision. Throughout the                            procedure, the patient's blood pressure, pulse, and                            oxygen saturations were monitored continuously. The                            Model GIF-[id number]) scope was introduced                            through the mouth, and advanced to the second part                            of  duodenum. The upper GI endoscopy was                            accomplished without difficulty. The patient                            tolerated the procedure well. Scope In: Scope Out: Findings:                 The esophagus was normal.                           The stomach was normal.                           The examined duodenum was normal.                           The cardia and gastric fundus were normal on                            retroflexion.                           The scope was withdrawn. Dilation was performed in                            the entire esophagus with a Maloney dilator with no                            resistance at 66 Fr. Estimated blood loss: none. Complications:            No immediate complications. Estimated Blood Loss:     Estimated blood loss: none. Impression:               - Normal esophagus.                           - Normal stomach.                           - Normal examined duodenum.                           -  Dilation performed in the entire esophagus.                           - No specimens collected. Recommendation:           - Patient has a contact number available for                            emergencies. The signs and symptoms of potential                            delayed complications were discussed with the                            patient. Return to normal activities tomorrow.                            Written discharge instructions were provided to the                            patient.                           - Clear liquids x 1 hour then soft foods rest of                            day. Start prior diet tomorrow.                           - Continue present medications.                           - Stay on bid PPI                           She is to contact me to see if dilation helped                            anything                           nothing here to explain fevers                           thyroid US  w/ multiple nodules but that is old and                            none warrant bx Gatha Mayer, MD 08/06/2017 9:06:12 AM This report has been signed electronically.

## 2017-08-06 NOTE — Patient Instructions (Addendum)
I did not see any problems  I did go ahead and dilate the esophagus to see if that will help.  Please let me know if that makes a difference by sending a My Chart message next week.  I do not see anything that would explain a fever.  I ordered  a new esomeprazole rx at [facility] for twice a day.  I appreciate the opportunity to care for you. Gatha Mayer, MD, FACG   YOU HAD AN ENDOSCOPIC PROCEDURE TODAY AT [facility]:   Refer to the procedure report that was given to you for any specific questions about what was found during the examination.  If the procedure report does not answer your questions, please call your gastroenterologist to clarify.  If you requested that your care partner not be given the details of your procedure findings, then the procedure report has been included in a sealed envelope for you to review at your convenience later.  YOU SHOULD EXPECT: Some feelings of bloating in the abdomen. Passage of more gas than usual.  Walking can help get rid of the air that was put into your GI tract during the procedure and reduce the bloating. If you had a lower endoscopy (such as a colonoscopy or flexible sigmoidoscopy) you may notice spotting of blood in your stool or on the toilet paper. If you underwent a bowel prep for your procedure, you may not have a normal bowel movement for a few days.  Please Note:  You might notice some irritation and congestion in your nose or some drainage.  This is from the oxygen used during your procedure.  There is no need for concern and it should clear up in a day or so.  SYMPTOMS TO REPORT IMMEDIATELY:    Following upper endoscopy (EGD)  Vomiting of blood or coffee ground material  New chest pain or pain under the shoulder blades  Painful or persistently difficult swallowing  New shortness of breath  Fever of 100F or higher  Black, tarry-looking stools  For urgent or emergent issues, a gastroenterologist can  be reached at any hour by calling [phone].   DIET:  We do recommend a small meal at first, but then you may proceed to your regular diet.  Drink plenty of fluids but you should avoid alcoholic beverages for 24 hours.  ACTIVITY:  You should plan to take it easy for the rest of today and you should NOT DRIVE or use heavy machinery until tomorrow (because of the sedation medicines used during the test).    FOLLOW UP: Our staff will call the number listed on your records the next business day following your procedure to check on you and address any questions or concerns that you may have regarding the information given to you following your procedure. If we do not reach you, we will leave a message.  However, if you are feeling well and you are not experiencing any problems, there is no need to return our call.  We will assume that you have returned to your regular daily activities without incident.  If any biopsies were taken you will be contacted by phone or by letter within the next 1-3 weeks.  Please call us at [phone] if you have not heard about the biopsies in 3 weeks.    SIGNATURES/CONFIDENTIALITY: You and/or your care partner have signed paperwork which will be entered into your electronic medical record.  These signatures attest to the fact that that  the information above on your After Visit Summary has been reviewed and is understood.  Full responsibility of the confidentiality of this discharge information lies with you and/or your care-partner.

## 2017-08-06 NOTE — Progress Notes (Signed)
Report to PACU, RN, vss, BBS= Clear.  

## 2017-08-07 ENCOUNTER — Telehealth: Payer: Self-pay | Admitting: *Deleted

## 2017-08-07 NOTE — Telephone Encounter (Signed)
  Follow up Call-  Call back number 08/06/2017  Post procedure Call Back phone  # [phone]  Permission to leave phone message Yes  Some recent data might be hidden     Patient questions:  Do you have a fever, pain , or abdominal swelling? No. Pain Score  0 *  Have you tolerated food without any problems? Yes.    Have you been able to return to your normal activities? Yes.    Do you have any questions about your discharge instructions: Diet   No. Medications  No. Follow up visit  No.  Do you have questions or concerns about your Care? No.  Actions: * If pain score is 4 or above: No action needed, pain <4.

## 2017-08-10 ENCOUNTER — Encounter: Payer: Self-pay | Admitting: Rheumatology

## 2017-08-10 ENCOUNTER — Ambulatory Visit (INDEPENDENT_AMBULATORY_CARE_PROVIDER_SITE_OTHER): Payer: 59 | Admitting: Rheumatology

## 2017-08-10 VITALS — BP 152/83 | HR 80 | Resp 15 | Ht 66.0 in | Wt 211.0 lb

## 2017-08-10 DIAGNOSIS — M5030 Other cervical disc degeneration, unspecified cervical region: Secondary | ICD-10-CM | POA: Diagnosis not present

## 2017-08-10 DIAGNOSIS — M533 Sacrococcygeal disorders, not elsewhere classified: Secondary | ICD-10-CM

## 2017-08-10 DIAGNOSIS — M19041 Primary osteoarthritis, right hand: Secondary | ICD-10-CM

## 2017-08-10 DIAGNOSIS — G8929 Other chronic pain: Secondary | ICD-10-CM | POA: Diagnosis not present

## 2017-08-10 DIAGNOSIS — R5383 Other fatigue: Secondary | ICD-10-CM | POA: Diagnosis not present

## 2017-08-10 DIAGNOSIS — Z8709 Personal history of other diseases of the respiratory system: Secondary | ICD-10-CM

## 2017-08-10 DIAGNOSIS — D682 Hereditary deficiency of other clotting factors: Secondary | ICD-10-CM | POA: Diagnosis not present

## 2017-08-10 DIAGNOSIS — M5136 Other intervertebral disc degeneration, lumbar region: Secondary | ICD-10-CM

## 2017-08-10 DIAGNOSIS — Z9889 Other specified postprocedural states: Secondary | ICD-10-CM | POA: Diagnosis not present

## 2017-08-10 DIAGNOSIS — Z853 Personal history of malignant neoplasm of breast: Secondary | ICD-10-CM

## 2017-08-10 DIAGNOSIS — M797 Fibromyalgia: Secondary | ICD-10-CM

## 2017-08-10 DIAGNOSIS — F5101 Primary insomnia: Secondary | ICD-10-CM | POA: Diagnosis not present

## 2017-08-10 DIAGNOSIS — M19042 Primary osteoarthritis, left hand: Secondary | ICD-10-CM

## 2017-08-10 MED ORDER — LIDOCAINE HCL 1 % IJ SOLN
1.0000 mL | INTRAMUSCULAR | Status: AC | PRN
  Administered 2017-08-10: 1 mL

## 2017-08-10 MED ORDER — TRIAMCINOLONE ACETONIDE 40 MG/ML IJ SUSP
40.0000 mg | INTRAMUSCULAR | Status: AC | PRN
  Administered 2017-08-10: 40 mg via INTRA_ARTICULAR

## 2017-08-12 DIAGNOSIS — N3281 Overactive bladder: Secondary | ICD-10-CM | POA: Diagnosis not present

## 2017-08-12 DIAGNOSIS — N3941 Urge incontinence: Secondary | ICD-10-CM | POA: Diagnosis not present

## 2017-08-12 DIAGNOSIS — R310 Gross hematuria: Secondary | ICD-10-CM | POA: Diagnosis not present

## 2017-08-14 ENCOUNTER — Encounter: Payer: Self-pay | Admitting: Emergency Medicine

## 2017-08-14 ENCOUNTER — Ambulatory Visit (INDEPENDENT_AMBULATORY_CARE_PROVIDER_SITE_OTHER): Payer: 59 | Admitting: Emergency Medicine

## 2017-08-14 ENCOUNTER — Telehealth: Payer: Self-pay | Admitting: Emergency Medicine

## 2017-08-14 VITALS — BP 158/90 | HR 88 | Ht 66.5 in | Wt 211.0 lb

## 2017-08-14 DIAGNOSIS — J383 Other diseases of vocal cords: Secondary | ICD-10-CM

## 2017-08-14 DIAGNOSIS — R490 Dysphonia: Secondary | ICD-10-CM | POA: Diagnosis not present

## 2017-08-14 NOTE — Telephone Encounter (Signed)
Patient returned call, CB is [phone].

## 2017-08-14 NOTE — Progress Notes (Signed)
Subjective:    Patient ID: Tracey Rodriguez, female    DOB: 10/12/1960, 57 y.o.   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  She underwent PFT 11/10/2012 that I reviewed.  This showed grossly normal airflows, normal volumes and normal diffusion capacity.  Possibly some mild obstruction  and restriction based on low FEV1 and FVC with a normal ratio.  Bronchodilators were not continued.  She returns today with symptoms consistent with upper airway irritation, severe hoarseness. A lot of throat clearing, globus sensation.  Has been see by her PCP and treated with abx and pred for ? Pharyngitis v bronchitis. She has had fatigue. Was treated with nasal steroid, zyrtec.   She was evaluated by ENT 07/27/2017 and laryngoscopy revealed a septal perforation, normal pharyngeal and glottic anatomy, incomplete closure of the vocal cords and a posterior glottic chink with phonation consistent with some muscle tension.  Her nexium has been increased to bid, not clearing her throat as much. She has seen GI, had esophageal dilation performed. She would be willing to see speech therapy    Review of Systems  Constitutional: Positive for unexpected weight change. Negative for fever.  HENT: Negative for congestion, dental problem, ear pain, nosebleeds, postnasal drip, rhinorrhea, sinus pressure, sneezing, sore throat and trouble swallowing.   Eyes: Negative for redness and itching.  Respiratory: Positive for shortness of breath. Negative for cough, chest tightness and wheezing.   Cardiovascular: Negative for palpitations and leg swelling.  Gastrointestinal: Negative for nausea and vomiting.  Genitourinary: Negative for dysuria.  Musculoskeletal: Negative for joint swelling.  Skin: Negative for rash.  Neurological: Positive for headaches.  Hematological: Does not bruise/bleed easily.  Psychiatric/Behavioral:  Negative for dysphoric mood. The patient is not nervous/anxious.     Past Medical History:  Diagnosis Date  . Arthritis    deg disc disease  - lower back, neck  . Cancer ([place])    left breast surgery-lumpectomy  . Factor V deficiency ([place])   . Fibromyalgia   . GERD (gastroesophageal reflux disease)   . Heart murmur   . Hyperlipidemia   . Hypothyroidism    goiter  . Neuromuscular disorder ([place])    sciatic nerve  . PONV (postoperative nausea and vomiting)   . SVD (spontaneous vaginal delivery)    x 2     Family History  Problem Relation Age of Onset  . Heart disease Father 68  . Heart attack Father   . Clotting disorder Father   . Rheumatologic disease Father   . Crohn's disease Father   . Heart disease Maternal Grandfather   . Lung cancer Maternal Grandfather   . Breast cancer Mother 73  . Ovarian cancer Mother   . Liver cancer Maternal Grandmother   . Heart attack Brother   . Cancer Brother   . Lung cancer Brother   . Colon polyps Brother   . Colon polyps Brother   . Breast cancer Maternal Aunt 60  . Kidney disease Paternal Uncle   . Irritable bowel syndrome Son      Social History   Socioeconomic History  . Marital status: Married    Spouse name: Not on file  . Number of children: 2  . Years of education: Not on file  . Highest education level: Not on file  Occupational History  . Not on file  Social Needs  . Financial resource strain: Not on file  .  Food insecurity:    Worry: Not on file    Inability: Not on file  . Transportation needs:    Medical: Not on file    Non-medical: Not on file  Tobacco Use  . Smoking status: Former Smoker    Packs/day: 3.00    Years: 22.00    Pack years: 66.00    Types: Cigarettes    Last attempt to quit: 02/18/2000    Years since quitting: 17.4  . Smokeless tobacco: Never Used  Substance and Sexual Activity  . Alcohol use: Yes    Alcohol/week: 0.0 oz    Comment: socially  . Drug use: Never  . Sexual activity:  Yes    Birth control/protection: Post-menopausal  Lifestyle  . Physical activity:    Days per week: Not on file    Minutes per session: Not on file  . Stress: Not on file  Relationships  . Social connections:    Talks on phone: Not on file    Gets together: Not on file    Attends religious service: Not on file    Active member of club or organization: Not on file    Attends meetings of clubs or organizations: Not on file    Relationship status: Not on file  . Intimate partner violence:    Fear of current or ex partner: Not on file    Emotionally abused: Not on file    Physically abused: Not on file    Forced sexual activity: Not on file  Other Topics Concern  . Not on file  Social History Narrative  . Not on file     Allergies  Allergen Reactions  . Capsaicin Anaphylaxis    Pt not sure of name   . Etodolac Shortness Of Breath    REACTION: dyspnea/tightness in chest  . Bee Venom   . Doxycycline Nausea And Vomiting  . Pepto-Bismol [Bismuth] Nausea And Vomiting  . Pork-Derived Products Nausea And Vomiting  . Sulfonamide Derivatives Nausea And Vomiting    REACTION: vomiting     Outpatient Medications Prior to Visit  Medication Sig Dispense Refill  . albuterol (PROVENTIL HFA;VENTOLIN HFA) 108 (90 BASE) MCG/ACT inhaler Inhale 2 puffs into the lungs every 6 (six) hours as needed for wheezing or shortness of breath.    Marland Kitchen aspirin 81 MG tablet Take 81 mg by mouth daily.    . diclofenac sodium (VOLTAREN) 1 % GEL Apply 1 application topically daily as needed (arthritis).    . Doxycycline Hyclate (DORYX) 200 MG TBEC Take 200 mg by mouth daily as needed (skin inflammation).    Marland Kitchen doxylamine, Sleep, (UNISOM) 25 MG tablet Take 25 mg by mouth at bedtime as needed.    Marland Kitchen esomeprazole (NEXIUM) 40 MG capsule Take 1 capsule (40 mg total) by mouth 2 (two) times daily before a meal. 60 capsule 1  . estrogens-methylTEST (ESTRATEST) 1.25-2.5 MG tablet Take 1 tablet by mouth daily.  4  .  fluocinonide ointment (LIDEX) 3.55 % Apply 1 application topically 2 (two) times daily.    . furosemide (LASIX) 20 MG tablet Take 20 mg by mouth daily as needed for fluid or edema.    Marland Kitchen levothyroxine (SYNTHROID, LEVOTHROID) 50 MCG tablet Take 50 mcg by mouth daily.    . medroxyPROGESTERone (PROVERA) 2.5 MG tablet Take 2.5 mg by mouth daily.  4  . mirabegron ER (MYRBETRIQ) 50 MG TB24 Take 50 mg by mouth daily.    . mupirocin ointment (BACTROBAN) 2 % Place 1 application  into the nose daily as needed (blood blisters).    Marland Kitchen OVER THE COUNTER MEDICATION Take 2 capsules by mouth daily. Med Name: STRESS B COMPLEX CAPSULE     Facility-Administered Medications Prior to Visit  Medication Dose Route Frequency Provider Last Rate Last Dose  . 0.9 %  sodium chloride infusion  500 mL Intravenous Once Gatha Mayer, MD            Objective:   Physical Exam Vitals:   08/14/17 0949  BP: (!) 158/90  Pulse: 88  SpO2: 98%  Weight: 211 lb (95.7 kg)  Height: 5' 6.5" (1.689 m)   Gen: Pleasant, overwt, in no distress,  normal affect  ENT: No lesions,  mouth clear,  oropharynx clear, no postnasal drip  Neck: No JVD, no stridor  Lungs: No use of accessory muscles, clear bilaterally  Cardiovascular: RRR, heart sounds normal, no murmur or gallops, no peripheral edema  Musculoskeletal: No deformities, no cyanosis or clubbing  Neuro: alert, non focal  Skin: Warm, no lesions or rash     Assessment & Plan:  Hoarseness Based on her very good description this sounds like upper airway irritation.  She has a globus sensation.  ENT eval questioned possible vocal cord dysfunction.  Suspect that 1 of her biggest contributors is GERD as her symptoms have improved some with an increase in her Nexium.  I think we should treat her with this and also empirically with Zyrtec in case there is a low-level contribution of chronic rhinitis.  She is willing to go to speech therapy and we will refer her.  She needs  pulmonary function testing to rule out any evolution of COPD or obstructive lung disease.  She may have had low-level obstruction in 2014 on her PFT.  I would not start any bronchodilators right now.  I discussed techniques to help including avoiding throat clearing, etc.  Please continue Nexium 40 mg twice a day for now.  Take this medicine 1 hour before eating.  We may be able to cut this down in the future as your throat irritation improves. Restart Zyrtec 10 mg once daily until your follow-up visit here. We will perform full pulmonary function testing Try your best to avoid throat clearing.  Use a non-mentholated sugar-free candy or cough drop to soothe your throat.  When you feel the urge to clear her throat, just swallow. Practice voice rest as you are able.  We will refer you to speech therapy for techniques to address vocal cord irritation Follow with Dr Lamonte Sakai in 1 month or next available with full PFT  Baltazar Apo, MD, PhD 08/14/2017, 10:19 AM [facility] [phone] or if no answer [phone]

## 2017-08-14 NOTE — Patient Instructions (Signed)
Please continue Nexium 40 mg twice a day for now.  Take this medicine 1 hour before eating.  We may be able to cut this down in the future as your throat irritation improves. Restart Zyrtec 10 mg once daily until your follow-up visit here. We will perform full pulmonary function testing Try your best to avoid throat clearing.  Use a non-mentholated sugar-free candy or cough drop to soothe your throat.  When you feel the urge to clear her throat, just swallow. Practice voice rest as you are able.  We will refer you to speech therapy for techniques to address vocal cord irritation Follow with Dr Lamonte Sakai in 1 month or next available with full PFT

## 2017-08-14 NOTE — Assessment & Plan Note (Signed)
Based on her very good description this sounds like upper airway irritation.  She has a globus sensation.  ENT eval questioned possible vocal cord dysfunction.  Suspect that 1 of her biggest contributors is GERD as her symptoms have improved some with an increase in her Nexium.  I think we should treat her with this and also empirically with Zyrtec in case there is a low-level contribution of chronic rhinitis.  She is willing to go to speech therapy and we will refer her.  She needs pulmonary function testing to rule out any evolution of COPD or obstructive lung disease.  She may have had low-level obstruction in 2014 on her PFT.  I would not start any bronchodilators right now.  I discussed techniques to help including avoiding throat clearing, etc.  Please continue Nexium 40 mg twice a day for now.  Take this medicine 1 hour before eating.  We may be able to cut this down in the future as your throat irritation improves. Restart Zyrtec 10 mg once daily until your follow-up visit here. We will perform full pulmonary function testing Try your best to avoid throat clearing.  Use a non-mentholated sugar-free candy or cough drop to soothe your throat.  When you feel the urge to clear her throat, just swallow. Practice voice rest as you are able.  We will refer you to speech therapy for techniques to address vocal cord irritation Follow with Dr Lamonte Sakai in 1 month or next available with full PFT

## 2017-08-14 NOTE — Telephone Encounter (Signed)
Attempted to call patient today regarding SOB. Pt sprayed 93% rubbing alcohol it took her breath away, and had trouble with her voice. Wanted this in her records.She was seen this am for appt. I did not receive an answer at time of call. I have left a voicemail message for pt to return call. X1

## 2017-08-14 NOTE — Telephone Encounter (Signed)
Called patient, unable to reach left message to give us a call back. 

## 2017-08-18 ENCOUNTER — Ambulatory Visit (INDEPENDENT_AMBULATORY_CARE_PROVIDER_SITE_OTHER): Payer: 59 | Admitting: Emergency Medicine

## 2017-08-18 DIAGNOSIS — R490 Dysphonia: Secondary | ICD-10-CM

## 2017-08-18 LAB — PULMONARY FUNCTION TEST
DL/VA % PRED: 91 %
DL/VA: 4.59 ml/min/mmHg/L
DLCO unc % pred: 86 %
DLCO unc: 23.19 ml/min/mmHg
FEF 25-75 POST: 3.85 L/s
FEF 25-75 Pre: 3.52 L/sec
FEF2575-%CHANGE-POST: 9 %
FEF2575-%PRED-POST: 148 %
FEF2575-%PRED-PRE: 135 %
FEV1-%Change-Post: 2 %
FEV1-%PRED-PRE: 104 %
FEV1-%Pred-Post: 107 %
FEV1-PRE: 2.95 L
FEV1-Post: 3.03 L
FEV1FVC-%Change-Post: 5 %
FEV1FVC-%Pred-Pre: 106 %
FEV6-%CHANGE-POST: -2 %
FEV6-%PRED-PRE: 100 %
FEV6-%Pred-Post: 97 %
FEV6-POST: 3.42 L
FEV6-Pre: 3.52 L
FEV6FVC-%PRED-POST: 103 %
FEV6FVC-%PRED-PRE: 103 %
FVC-%CHANGE-POST: -2 %
FVC-%PRED-POST: 94 %
FVC-%Pred-Pre: 97 %
FVC-POST: 3.45 L
FVC-Pre: 3.52 L
POST FEV6/FVC RATIO: 100 %
PRE FEV1/FVC RATIO: 84 %
Post FEV1/FVC ratio: 88 %
Pre FEV6/FVC Ratio: 100 %
RV % pred: 86 %
RV: 1.76 L
TLC % PRED: 99 %
TLC: 5.35 L

## 2017-08-18 NOTE — Telephone Encounter (Signed)
Attempted to call pt but unable to reach. Left message for pt to return call. 

## 2017-08-18 NOTE — Telephone Encounter (Signed)
Called and spoke with pt who stated when she sprayed 93% rubbing alcohol, it took her voice and breath away. Pt used this rubbing alcohol due to bed bugs.  Pt wanted to make RB aware of the 93% rubbing alcohol due to her thinking this is playing a major role in her symptoms.  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  Pt states still to this day, her voice is not fully back.  Pt has a PFT scheduled 7/2 at 3:00 and has the f/u appt with Wyn Quaker 7/8.  Nothing further needed. Routing to Dr. Lamonte Sakai as an Juluis Rainier

## 2017-08-18 NOTE — Telephone Encounter (Signed)
Pt is calling back [phone]

## 2017-08-18 NOTE — Progress Notes (Signed)
PFT done today. 

## 2017-08-19 ENCOUNTER — Telehealth: Payer: Self-pay | Admitting: Internal Medicine

## 2017-08-19 NOTE — Telephone Encounter (Signed)
Patient update

## 2017-08-21 NOTE — Telephone Encounter (Signed)
Will message via My Chart re: stay on bid Nexium and f/u pulmonary

## 2017-08-23 NOTE — Progress Notes (Signed)
@Patient  ID: Tracey Rodriguez, female    DOB: Nov 20, 1960, 57 y.o.   MRN: [id number]  Chief Complaint  Patient presents with  . Follow-up    PFT 08/18/17. States the hoarseness continues to come and go. Not much has changed. Has been set up with speech therapy.    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  She underwent PFT 11/10/2012 that I reviewed.  This showed grossly normal airflows, normal volumes and normal diffusion capacity.  Possibly some mild obstruction  and restriction based on low FEV1 and FVC with a normal ratio.  Bronchodilators were not continued.   Recent [facility] Encounters:   08/14/2017-office visit-[place] She returns today with symptoms consistent with upper airway irritation, severe hoarseness. A lot of throat clearing, globus sensation.  Has been see by her PCP and treated with abx and pred for ? Pharyngitis v bronchitis. She has had fatigue. Was treated with nasal steroid, zyrtec.  She was evaluated by ENT 07/27/2017 and laryngoscopy revealed a septal perforation, normal pharyngeal and glottic anatomy, incomplete closure of the vocal cords and a posterior glottic chink with phonation consistent with some muscle tension.  Her nexium has been increased to bid, not clearing her throat as much. She has seen GI, had esophageal dilation performed. She would be willing to see speech therapy  Upper airway irritation with globus sensation.  ENT eval question possible vocal cord dysfunction.  Suspect that 1 of her biggest contributors to this is GERD, also can be chronic rhinitis.  Will need PFT to ensure COPD or obstructive lung disease is not progressing.  Do not need bronchodilators at this time. Plan continue Nexium twice daily, restart Zyrtec, pulmonary function tests, avoid throat clearing, practice voice rest, follow-up in 1 month, will refer  to speech therapy   Tests:  08/03/2017-ultrasound of thyroid- no thyroid nodule meets criteria for biopsy or surveillance, recommendations to follow these established PI-RADS criteria 08/18/2017-pulmonary function test- normal PFT     08/23/17 OV 57 year old patient of Dr. Lamonte Sakai presenting to office visit for follow-up.  Patient reporting she has not feeling like any of the recent interventions have helped manage her respiratory status or hoarseness.  Patient has been referred to speech therapy but has not had her first appointment yet.  Patient reports she is adherent to GERD diet as well as her medications.  Patient denies any rescue inhaler use in the last week.  Patient reports that mainly she was having symptoms of feeling short of breath or the air is not moving in and out of her lungs when she exercises.  Patient reports that she has not exercised since April/2019 due to her current symptoms.  Patient still reporting occasional low-grade fever.  Temperature today is 99.2.   Allergies  Allergen Reactions  . Capsaicin Anaphylaxis    Pt not sure of name   . Etodolac Shortness Of Breath    REACTION: dyspnea/tightness in chest  . Bee Venom   . Doxycycline Nausea And Vomiting  . Pepto-Bismol [Bismuth] Nausea And Vomiting  . Pork-Derived Products Nausea And Vomiting  . Sulfonamide Derivatives Nausea And Vomiting    REACTION: vomiting    Immunization History  Administered Date(s) Administered  . Influenza Split 11/17/2012  . Influenza-Unspecified 11/24/2016    Past Medical History:  Diagnosis Date  . Arthritis    deg disc disease  - lower back, neck  .  Cancer ([place])    left breast surgery-lumpectomy  . Factor V deficiency ([place])   . Fibromyalgia   . GERD (gastroesophageal reflux disease)   . Heart murmur   . Hyperlipidemia   . Hypothyroidism    goiter  . Neuromuscular disorder ([place])    sciatic nerve  . PONV (postoperative nausea and vomiting)   . SVD (spontaneous vaginal  delivery)    x 2    Tobacco History: Social History   Tobacco Use  Smoking Status Former Smoker  . Packs/day: 3.00  . Years: 22.00  . Pack years: 66.00  . Types: Cigarettes  . Last attempt to quit: 02/18/2000  . Years since quitting: 17.5  Smokeless Tobacco Never Used   Counseling given: Yes Continue to avoid smoking.  Outpatient Encounter Medications as of 08/24/2017  Medication Sig  . albuterol (PROVENTIL HFA;VENTOLIN HFA) 108 (90 BASE) MCG/ACT inhaler Inhale 2 puffs into the lungs every 6 (six) hours as needed for wheezing or shortness of breath.  Marland Kitchen aspirin 81 MG tablet Take 81 mg by mouth daily.  . cetirizine (ZYRTEC) 10 MG tablet Take 10 mg by mouth daily.  . diclofenac sodium (VOLTAREN) 1 % GEL Apply 1 application topically daily as needed (arthritis).  . Doxycycline Hyclate (DORYX) 200 MG TBEC Take 200 mg by mouth daily as needed (skin inflammation).  Marland Kitchen doxylamine, Sleep, (UNISOM) 25 MG tablet Take 25 mg by mouth at bedtime as needed.  Marland Kitchen esomeprazole (NEXIUM) 40 MG capsule Take 1 capsule (40 mg total) by mouth 2 (two) times daily before a meal.  . estrogens-methylTEST (ESTRATEST) 1.25-2.5 MG tablet Take 1 tablet by mouth daily.  . fluocinonide ointment (LIDEX) 1.66 % Apply 1 application topically 2 (two) times daily.  . furosemide (LASIX) 20 MG tablet Take 20 mg by mouth daily as needed for fluid or edema.  Marland Kitchen levothyroxine (SYNTHROID, LEVOTHROID) 50 MCG tablet Take 50 mcg by mouth daily.  . medroxyPROGESTERone (PROVERA) 2.5 MG tablet Take 2.5 mg by mouth daily.  . mirabegron ER (MYRBETRIQ) 50 MG TB24 Take 50 mg by mouth daily.  . mupirocin ointment (BACTROBAN) 2 % Place 1 application into the nose daily as needed (blood blisters).  Marland Kitchen OVER THE COUNTER MEDICATION Take 2 capsules by mouth daily. Med Name: STRESS B COMPLEX CAPSULE   Facility-Administered Encounter Medications as of 08/24/2017  Medication  . 0.9 %  sodium chloride infusion     Review of Systems  Review of  Systems  Constitutional: Positive for activity change (limited activity since april ), fatigue and fever (98.9 -101.2).  HENT: Negative for congestion, postnasal drip, sinus pressure, sinus pain and sneezing.   Respiratory: Positive for cough (loses voice when coughing ). Negative for chest tightness, shortness of breath and wheezing.   Cardiovascular: Negative for chest pain and palpitations.  Gastrointestinal: Negative for blood in stool, constipation, diarrhea, nausea and vomiting.  Genitourinary: Negative for hematuria and urgency.  Allergic/Immunologic: Positive for environmental allergies.  Neurological: Positive for headaches (right side ). Negative for dizziness and light-headedness.  Psychiatric/Behavioral: Negative for dysphoric mood. The patient is not nervous/anxious.       Physical Exam  BP 128/88   Pulse 72   Temp 99.2 F (37.3 C) (Oral)   Ht 5\' 6"  (1.676 m)   Wt 210 lb 12.8 oz (95.6 kg)   SpO2 98%   BMI 34.02 kg/m   Wt Readings from Last 5 Encounters:  08/24/17 210 lb 12.8 oz (95.6 kg)  08/14/17 211 lb (95.7 kg)  08/10/17 211 lb (95.7 kg)  08/06/17 208 lb (94.3 kg)  07/29/17 208 lb (94.3 kg)   Temp: 99.2  Physical Exam  Constitutional: She is oriented to person, place, and time and well-developed, well-nourished, and in no distress. Vital signs are normal. No distress.  HENT:  Head: Normocephalic and atraumatic.  Right Ear: Hearing, tympanic membrane, external ear and ear canal normal.  Left Ear: Hearing, tympanic membrane, external ear and ear canal normal.  Nose: Nose normal. Right sinus exhibits no maxillary sinus tenderness and no frontal sinus tenderness. Left sinus exhibits no maxillary sinus tenderness and no frontal sinus tenderness.  Mouth/Throat: Uvula is midline and oropharynx is clear and moist. No oropharyngeal exudate.  Eyes: Pupils are equal, round, and reactive to light.  Neck: Normal range of motion. Neck supple. No thyromegaly present.    Cardiovascular: Normal rate, regular rhythm and normal heart sounds.  Pulmonary/Chest: Effort normal and breath sounds normal. No accessory muscle usage. No respiratory distress. She has no decreased breath sounds. She has no wheezes. She has no rales.  Abdominal: Soft. Bowel sounds are normal. There is no tenderness.  Musculoskeletal: Normal range of motion.  Lymphadenopathy:    She has no cervical adenopathy.  Neurological: She is alert and oriented to person, place, and time. Gait normal.  Skin: Skin is warm and dry. No rash noted. She is not diaphoretic.  Psychiatric: Mood, memory, affect and judgment normal.  Nursing note and vitals reviewed.     Lab Results:  CBC    Component Value Date/Time   WBC 9.1 03/29/2013 1456   RBC 4.83 03/29/2013 1456   HGB 14.2 03/29/2013 1456   HGB 13.7 05/07/2009 1529   HCT 42.3 03/29/2013 1456   HCT 39.3 05/07/2009 1529   PLT 288.0 03/29/2013 1456   PLT 331 05/07/2009 1529   MCV 87.6 03/29/2013 1456   MCV 89.2 05/07/2009 1529   MCH 30.3 03/27/2013 0110   MCHC 33.5 03/29/2013 1456   RDW 12.9 03/29/2013 1456   RDW 12.5 05/07/2009 1529   LYMPHSABS 2.6 03/29/2013 1456   LYMPHSABS 2.1 05/07/2009 1529   MONOABS 0.8 03/29/2013 1456   MONOABS 0.8 05/07/2009 1529   EOSABS 0.1 03/29/2013 1456   EOSABS 0.1 05/07/2009 1529   BASOSABS 0.1 03/29/2013 1456   BASOSABS 0.1 05/07/2009 1529    BMET    Component Value Date/Time   NA 138 03/27/2013 0110   K 3.9 03/27/2013 0110   CL 96 03/27/2013 0110   CO2 26 03/27/2013 0110   GLUCOSE 139 (H) 03/27/2013 0110   BUN 18 03/27/2013 0110   CREATININE 0.88 03/27/2013 0110   CALCIUM 9.7 03/27/2013 0110   GFRNONAA 74 (L) 03/27/2013 0110   GFRAA 86 (L) 03/27/2013 0110    BNP No results found for: BNP  ProBNP No results found for: PROBNP  Imaging: US Thyroid  Result Date: 08/04/2017 CLINICAL DATA:  57 year old female with a history of hoarseness EXAM: THYROID ULTRASOUND TECHNIQUE: Ultrasound  examination of the thyroid gland and adjacent soft tissues was performed. COMPARISON:  09/03/2010, 02/27/2010 FINDINGS: Parenchymal Echotexture: Moderately heterogenous Isthmus: 0.3 cm Right lobe: 4.2 cm x 1.3 cm x 1.2 cm Left lobe: 4.0 cm x 1.2 cm x 1.1 cm _________________________________________________________ Estimated total number of nodules >/= 1 cm: 1 Number of spongiform nodules >/=  2 cm not described below (TR1): 0 Number of mixed cystic and solid nodules >/= 1.5 cm not described below ([place]): 0 _________________________________________________________ Nodule # 1: Location: Right; Inferior Maximum size:  0.7 cm; Other 2 dimensions: 0.4 cm x 0.5 cm Composition: cannot determine (2) Echogenicity: hypoechoic (2) Shape: not taller-than-wide (0) Margins: ill-defined (0) Echogenic foci: none (0) ACR TI-RADS total points: 4. ACR TI-RADS risk category: TR4 (4-6 points). ACR TI-RADS recommendations: Nodule does not meet criteria for surveillance or biopsy _________________________________________________________ Nodule # 2: Location: Left; Superior Maximum size: 0.6 cm; Other 2 dimensions: 0.5 cm x 0.5 cm Composition: cannot determine (2) Echogenicity: hypoechoic (2) Shape: not taller-than-wide (0) Margins: ill-defined (0) Echogenic foci: none (0) ACR TI-RADS total points: 4. ACR TI-RADS risk category: TR4 (4-6 points). ACR TI-RADS recommendations: Nodule does not meet criteria for surveillance or biopsy _________________________________________________________ Nodule # 3: Location: Left; Mid Maximum size: 0.6 cm; Other 2 dimensions: 0.4 cm x 0.5 cm Composition: cannot determine (2) Echogenicity: hypoechoic (2) Shape: not taller-than-wide (0) Margins: ill-defined (0) Echogenic foci: none (0) ACR TI-RADS total points: 4. ACR TI-RADS risk category: TR4 (4-6 points). ACR TI-RADS recommendations: Nodule does not meet criteria for surveillance or biopsy _________________________________________________________ Nodule #  4: Location: Left; Mid Maximum size: 1.5 cm; Other 2 dimensions: 0.6 cm x 0.8 cm Composition: cannot determine (2) Echogenicity: isoechoic (1) Shape: not taller-than-wide (0) Margins: ill-defined (0) Echogenic foci: none (0) ACR TI-RADS total points: 3. ACR TI-RADS risk category: TR3 (3 points). ACR TI-RADS recommendations: Nodule has decreased in size over 5 years, meeting criteria for benign nodule, with characteristics not meeting criteria for surveillance or biopsy. _________________________________________________________ Nodule # 5: Location: Left; Inferior Maximum size: 0.5 cm; Other 2 dimensions: 0.4 cm x 0.6 cm Composition: cannot determine (2) Echogenicity: hypoechoic (2) Shape: not taller-than-wide (0) Margins: ill-defined (0) Echogenic foci: none (0) ACR TI-RADS total points: 4. ACR TI-RADS risk category: TR4 (4-6 points). ACR TI-RADS recommendations: Nodule does not meet criteria for surveillance or biopsy _________________________________________________________ No adenopathy IMPRESSION: No thyroid nodule meets criteria for biopsy or surveillance, as designated by the newly established ACR TI-RADS criteria. Recommendations follow those established by the new ACR TI-RADS criteria (J Am Coll Radiol 3335;45:625-638). Electronically Signed   By: Corrie Mckusick D.O.   On: 08/04/2017 08:15     Assessment & Plan:   Pleasant 57 year old patient seen in office today for follow-up.  Patient encouraged to keep follow-ups with speech therapy.  Continue aggressive GERD management.  Unfortunately it sounds like patient is not following the strictest GERD diet as she mentions that some of her symptoms worsened after she ate [place].  Emphasized the importance of the patient that she needs to be following her diet as well in addition to taking her Nexium.  PFT completed last week not showing any significant obstruction or changes.  Also no bronchodilator response.  I do not think that we need to do  bronchodilators at this time or any controller therapies.  Have patient follow-up with Dr. Lamonte Sakai in 3 months.  Gastroesophageal reflux disease Continue speech therapy  Check temperature today  Continue Nexium  Continue Zyrtec  Continue to avoid clearing her throat Take small sips of water Can use sugar-free candies Use NSAIDs (ibuprofen or Aleve) or Tylenol for temperature   Follow up in 3 months    Hoarseness Continue speech therapy  Check temperature today  Continue Nexium  Continue Zyrtec  Continue to avoid clearing her throat Take small sips of water Can use sugar-free candies Use NSAIDs (ibuprofen or Aleve) or Tylenol for temperature   Follow up in 3 months       Lauraine Rinne, NP 08/24/2017

## 2017-08-24 ENCOUNTER — Encounter: Payer: Self-pay | Admitting: Pulmonary Disease

## 2017-08-24 ENCOUNTER — Ambulatory Visit (INDEPENDENT_AMBULATORY_CARE_PROVIDER_SITE_OTHER): Payer: 59 | Admitting: Pulmonary Disease

## 2017-08-24 DIAGNOSIS — R490 Dysphonia: Secondary | ICD-10-CM | POA: Diagnosis not present

## 2017-08-24 DIAGNOSIS — K219 Gastro-esophageal reflux disease without esophagitis: Secondary | ICD-10-CM | POA: Diagnosis not present

## 2017-08-24 DIAGNOSIS — H18603 Keratoconus, unspecified, bilateral: Secondary | ICD-10-CM | POA: Diagnosis not present

## 2017-08-24 DIAGNOSIS — H25813 Combined forms of age-related cataract, bilateral: Secondary | ICD-10-CM | POA: Diagnosis not present

## 2017-08-24 NOTE — Assessment & Plan Note (Signed)
Continue speech therapy  Check temperature today  Continue Nexium  Continue Zyrtec  Continue to avoid clearing her throat Take small sips of water Can use sugar-free candies Use NSAIDs (ibuprofen or Aleve) or Tylenol for temperature   Follow up in 3 months

## 2017-08-24 NOTE — Patient Instructions (Addendum)
Continue speech therapy  Check temperature today  Continue Nexium  Continue Zyrtec  Continue to avoid clearing her throat Take small sips of water Can use sugar-free candies Use NSAIDs (ibuprofen or Aleve) or Tylenol for temperature   Follow up in 3 months    Please contact the office if your symptoms worsen or you have concerns that you are not improving.   Thank you for choosing [facility] for your healthcare, and for allowing [place] to partner with you on your healthcare journey. I am thankful to be able to provide care to you today.   Wyn Quaker FNP-C              Food Choices for Gastroesophageal Reflux Disease, Adult When you have gastroesophageal reflux disease (GERD), the foods you eat and your eating habits are very important. Choosing the right foods can help ease your discomfort. What guidelines do I need to follow?  Choose fruits, vegetables, whole grains, and low-fat dairy products.  Choose low-fat meat, fish, and poultry.  Limit fats such as oils, salad dressings, butter, nuts, and avocado.  Keep a food diary. This helps you identify foods that cause symptoms.  Avoid foods that cause symptoms. These may be different for everyone.  Eat small meals often instead of 3 large meals a day.  Eat your meals slowly, in a place where you are relaxed.  Limit fried foods.  Cook foods using methods other than frying.  Avoid drinking alcohol.  Avoid drinking large amounts of liquids with your meals.  Avoid bending over or lying down until 2-3 hours after eating. What foods are not recommended? These are some foods and drinks that may make your symptoms worse: Vegetables Tomatoes. Tomato juice. Tomato and spaghetti sauce. Chili peppers. Onion and garlic. Horseradish. Fruits Oranges, grapefruit, and lemon (fruit and juice). Meats High-fat meats, fish, and poultry. This includes hot dogs, ribs, ham, sausage, salami, and bacon. Dairy Whole milk  and chocolate milk. Sour cream. Cream. Butter. Ice cream. Cream cheese. Drinks Coffee and tea. Bubbly (carbonated) drinks or energy drinks. Condiments Hot sauce. Barbecue sauce. Sweets/Desserts Chocolate and cocoa. Donuts. Peppermint and spearmint. Fats and Oils High-fat foods. This includes [place] fries and potato chips. Other Vinegar. Strong spices. This includes black pepper, white pepper, red pepper, cayenne, curry powder, cloves, ginger, and chili powder. The items listed above may not be a complete list of foods and drinks to avoid. Contact your dietitian for more information. This information is not intended to replace advice given to you by your health care provider. Make sure you discuss any questions you have with your health care provider. Document Released: 08/05/2011 Document Revised: 07/12/2015 Document Reviewed: 12/08/2012 Elsevier Interactive Patient Education  2017 [employer].

## 2017-08-25 ENCOUNTER — Ambulatory Visit (INDEPENDENT_AMBULATORY_CARE_PROVIDER_SITE_OTHER): Payer: 59 | Admitting: Rheumatology

## 2017-08-25 ENCOUNTER — Encounter: Payer: Self-pay | Admitting: Rheumatology

## 2017-08-25 VITALS — BP 148/88 | HR 73 | Resp 15 | Ht 66.0 in | Wt 213.0 lb

## 2017-08-25 DIAGNOSIS — G8929 Other chronic pain: Secondary | ICD-10-CM | POA: Diagnosis not present

## 2017-08-25 DIAGNOSIS — M533 Sacrococcygeal disorders, not elsewhere classified: Secondary | ICD-10-CM | POA: Diagnosis not present

## 2017-08-25 MED ORDER — TRIAMCINOLONE ACETONIDE 40 MG/ML IJ SUSP
40.0000 mg | INTRAMUSCULAR | Status: AC | PRN
  Administered 2017-08-25: 40 mg via INTRA_ARTICULAR

## 2017-08-25 MED ORDER — LIDOCAINE HCL 1 % IJ SOLN
1.0000 mL | INTRAMUSCULAR | Status: AC | PRN
  Administered 2017-08-25: 1 mL

## 2017-08-25 NOTE — Progress Notes (Signed)
   Procedure Note  Patient: Tracey Rodriguez             Date of Birth: 10/09/1960           MRN: [id number]             Visit Date: 08/25/2017  Procedures: Visit Diagnoses: Chronic SI joint pain - Right - Plan: Sacroiliac Joint Inj  Sacroiliac Joint Inj on 08/25/2017 2:01 PM Indications: pain Details: 27 G 1.5 in needle, posterior approach Medications: 1 mL lidocaine 1 %; 40 mg triamcinolone acetonide 40 MG/ML Aspirate: 0 mL Outcome: tolerated well, no immediate complications Procedure, treatment alternatives, risks and benefits explained, specific risks discussed. Consent was given by the patient. Immediately prior to procedure a time out was called to verify the correct patient, procedure, equipment, support staff and site/side marked as required. Patient was prepped and draped in the usual sterile fashion.     Bo Merino, MD

## 2017-08-26 DIAGNOSIS — K219 Gastro-esophageal reflux disease without esophagitis: Secondary | ICD-10-CM | POA: Diagnosis not present

## 2017-08-26 DIAGNOSIS — R490 Dysphonia: Secondary | ICD-10-CM | POA: Diagnosis not present

## 2017-08-26 DIAGNOSIS — M150 Primary generalized (osteo)arthritis: Secondary | ICD-10-CM | POA: Diagnosis not present

## 2017-08-27 ENCOUNTER — Encounter: Payer: Self-pay | Admitting: Speech Pathology

## 2017-08-27 ENCOUNTER — Other Ambulatory Visit: Payer: Self-pay

## 2017-08-27 ENCOUNTER — Ambulatory Visit: Payer: 59 | Attending: Emergency Medicine | Admitting: Speech Pathology

## 2017-08-27 DIAGNOSIS — R498 Other voice and resonance disorders: Secondary | ICD-10-CM

## 2017-08-27 DIAGNOSIS — R1312 Dysphagia, oropharyngeal phase: Secondary | ICD-10-CM

## 2017-08-27 NOTE — Therapy (Signed)
[facility] [address] [place], [place], [address] Phone: [phone]   Fax:  [phone]  Speech Language Pathology Evaluation  Patient Details  Name: Tracey Rodriguez MRN: [id number] Date of Birth: 02-Dec-1960 Referring Provider: Collene Gobble, MD   Encounter Date: 08/27/2017  End of Session - 08/27/17 1254    Visit Number  1    Number of Visits  17    Date for SLP Re-Evaluation  10/23/17    SLP Start Time  1018    SLP Stop Time   1105    SLP Time Calculation (min)  47 min    Activity Tolerance  Patient tolerated treatment well       Past Medical History:  Diagnosis Date  . Arthritis    deg disc disease  - lower back, neck  . Cancer ([facility])    left breast surgery-lumpectomy  . Factor V deficiency ([place])   . Fibromyalgia   . GERD (gastroesophageal reflux disease)   . Heart murmur   . Hyperlipidemia   . Hypothyroidism    goiter  . Neuromuscular disorder ([facility])    sciatic nerve  . PONV (postoperative nausea and vomiting)   . SVD (spontaneous vaginal delivery)    x 2    Past Surgical History:  Procedure Laterality Date  . ABLATION    . APPENDECTOMY    . BREAST EXCISIONAL BIOPSY Left   . BREAST SURGERY     left- lumpectomy  . DILATATION & CURRETTAGE/HYSTEROSCOPY WITH RESECTOCOPE N/A 04/05/2012   Procedure: hysteroscopy with endocervical curretting;  Surgeon: Allyn Kenner, DO;  Location: [facility];  Service: Gynecology;  Laterality: N/A;  . DILATION AND CURETTAGE OF UTERUS    . NOSE SURGERY      There were no vitals filed for this visit.  Subjective Assessment - 08/27/17 1055    Subjective  "I'm choking a lot."    Currently in Pain?  Yes    Pain Score  5     Pain Location  Back    Pain Orientation  Lower    Pain Descriptors / Indicators  Constant    Pain Type  Chronic pain    Pain Onset  More than a month ago    Pain Frequency  Constant    Pain Relieving Factors  "I had a shot the other day."          SLP Evaluation [facility] - 08/27/17 1055      SLP Visit Information   SLP Received On  08/27/17    Referring Provider  Collene Gobble, MD    Onset Date  05/30/17 referral 08/14/17    Medical Diagnosis  Vocal cord dysfunction       Subjective   Patient/Family Stated Goal  "I just want my voice back"      General Information   HPI  57 year old former smoker with a history of hypertension, breast cancer, GERD, hypothyroidism. Referred by pulmonologist Dr. Lamonte Sakai for possible vocal cord dysfunction. She was evaluated by ENT 07/27/2017 and laryngoscopy revealed a septal perforation, normal pharyngeal and glottic anatomy, incomplete closure of the vocal cords and a posterior glottic chink with phonation consistent with some muscle tension. Has been treated by PCP with abx for ? pharyngitis v bronchitis.     Behavioral/Cognition  alert, cooperative    Mobility Status  ambulated to session      Balance Screen   Has the patient fallen in the past 6 months  No  Prior Functional Status   Cognitive/Linguistic Baseline  Within functional limits    Type of Home  House     Lives With  Spouse      Cognition   Overall Cognitive Status  Within Functional Limits for tasks assessed      Auditory Comprehension   Overall Auditory Comprehension  Appears within functional limits for tasks assessed      Visual Recognition/Discrimination   Discrimination  Not tested      Reading Comprehension   Reading Status  Not tested      Expression   Primary Mode of Expression  Verbal      Verbal Expression   Overall Verbal Expression  Appears within functional limits for tasks assessed      Written Expression   Dominant Hand  Right    Written Expression  Not tested      Oral Motor/Sensory Function   Overall Oral Motor/Sensory Function  Appears within functional limits for tasks assessed    Overall Oral Motor/Sensory Function  accessory muscle tension noted      Motor Speech   Overall Motor  Speech  Impaired    Respiration  Impaired    Level of Impairment  Word    Phonation  Aphonic;Hoarse;Breathy;Low vocal intensity    Resonance  Within functional limits    Articulation  Within functional limitis    Intelligibility  Intelligible in quiet environment    Motor Planning  Witnin functional limits    Motor Speech Errors  Not applicable    Phonation  Impaired    Vocal Abuses  Vocal Fold Dehydration;Habitual Cough/Throat Clear;Prolonged Vocal Use    Tension Present  Neck;Shoulder    Volume  Soft    Pitch  Low      Standardized Assessments   Standardized Assessments   Other Assessment RSI, VRQOL, GFI    Other Assessment  Sustained /a/ average 8.5 seconds duration with low vocal intensity (65 dB average) and pitch breaks, rough, gravely quality. This quality persists along with period of aphonia during paragraph reading and conversation, which pt frequently responds to with harsh throat clearing. s/z ratio is WNL at .98, however with frequent phonation breaks. Oral motor examination performed with adequate strength, ROM, sensation and symmetry observed for CN V, VII, X, and XII. Palpation of larynx and accessory musculature with tension noted. Reflux symptom index (RSI) score of 24 (<10-13 is WNL), Glottal Function Index (GFI) score 13 (norm is <4 with SD of 2), VRQOL score of 62.5 (>87.5 is WNL). Clinical assessment of swallowing reveals throat clearing after larger or consecutive straw sips of thin liquids, and significant throat clearing after crumbly solids with pt reporting globus sensation at the level of her thyroid notch.                      SLP Education - 08/27/17 1253    Education Details  reflux modifications, vocal hygiene, increase water intake    Person(s) Educated  Patient    Methods  Explanation;Handout    Comprehension  Verbalized understanding;Need further instruction       SLP Short Term Goals - 08/27/17 1311      SLP SHORT TERM GOAL #1   Title   Pt will report adhering to reflux guidelines and at least 3 vocal hygiene strategies over 3 sessions.    Time  4    Period  Weeks    Status  New      SLP SHORT TERM  GOAL #2   Title  Pt will demonstrate laryngeal control strategies to reduce VCD symptoms over 3 sessions.    Time  4    Period  Weeks    Status  New      SLP SHORT TERM GOAL #3   Title  Pt will participate in MBSS for objective evaluation of swallow function.    Time  4    Period  Weeks    Status  New      SLP SHORT TERM GOAL #4   Title  Pt will demonstrate abdominal breathing in 19/20 sentence level responses over 3 sessions.    Time  4    Period  Weeks    Status  New      SLP SHORT TERM GOAL #5   Title  Pt will demo HEP for VCD with good technique and rare min A over 3 visits.    Time  4    Period  Weeks    Status  New       SLP Long Term Goals - 08/27/17 1316      SLP LONG TERM GOAL #1   Title  Pt will carryover of reflux precautions and 6 vocal hygiene strategies over 5 sessions per pt report    Time  8    Period  Weeks    Status  New      SLP LONG TERM GOAL #2   Title  Pt will use abdominal breathing >80% of the time in 15 minutes mod complex conversation over 3 sessions.    Time  4    Period  Weeks    Status  New      SLP LONG TERM GOAL #3   Title  pt will demo HEP for vocal fold adduction independently over 3 sessions      SLP LONG TERM GOAL #4   Title  Pt will report improved voice-related quality of life per VRQOL      SLP LONG TERM GOAL #5   Title  --       Plan - 08/27/17 1308    Clinical Impression Statement  Patient presents with moderate dysphonia secondary to vocal cord dysfunction and muscle tension as noted by ENT who also found "vocal cords do not have full closure and there is a posterior glottic chink with phonation." Per pt, "this all started when I was spraying 93% rubbing alcohol," in April 2019. Since then, she reports hoarseness, low pitch, vocal fatigue, and worsening  dysphagia. Vocal quality is hoarse, gravelly, with intermittent pitch breaks/aphonia, and pitch is subjectively low for age/gender. Secondary neck and shoulder tension noted during conversation, with clavicular breathing pattern. She reports environmental triggers including exercise, smoke, pesticides and dusty roads. She has a history of GERD (takes Nexium) and reports 3 esophageal dilations, most recently 08/20/17. She states in the past this has alleviated her symptoms, however she has continued to have coughing and choking episodes every meal. She "gets choked" on crumbly solids, as well as liquids and her own saliva occasionally. She describes globus sensation and reports unexplained fevers. She was observed with harsh throat clearing throughout the evaluation, although she has been trying to decrease this. She drinks 2 cups of caffeine daily but only drinks "a few sips" of water throughout the day. I recommend skilled ST to address dysphonia, improve vocal hygiene and vocal quality. I also recommend objective swallowing evaluation (MBSS) with dysphagia goals to be added PRN pending MBS results. Orders requested for  MBS this visit.    Speech Therapy Frequency  2x / week    Duration  -- 8 weeks or 16 additional visits    Treatment/Interventions  Aspiration precaution training;Diet toleration management by SLP;Trials of upgraded texture/liquids;Compensatory strategies;Pharyngeal strengthening exercises;Compensatory techniques;Cueing hierarchy;Internal/external aids;Functional tasks;SLP instruction and feedback;Patient/family education;Other (comment) Modified Barium Swallow Study (MBSS)    Potential to Achieve Goals  Good    SLP Home Exercise Plan  reflux precautions and vocal hygiene provided    Consulted and Agree with Plan of Care  Patient       Patient will benefit from skilled therapeutic intervention in order to improve the following deficits and impairments:   Other voice and resonance disorders -  Plan: SLP modified barium swallow  Dysphagia, oropharyngeal phase - Plan: SLP modified barium swallow    Problem List Patient Active Problem List   Diagnosis Date Noted  . Hoarseness 07/30/2017  . Gastroesophageal reflux disease 07/30/2017  . History of breast cancer 09/11/2016  . Other fatigue 03/15/2016  . Primary insomnia 03/15/2016  . Primary osteoarthritis of both hands 03/15/2016  . DDD cervical spine 03/15/2016  . 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 History of bilateral carpal tunnel release 03/15/2016  . Unspecified hypothyroidism 03/30/2013  . Fibromyalgia 03/30/2013  . Breast cancer ([facility]) 03/30/2013  . Factor V deficiency ([facility]) 03/30/2013  . Dyspnea 02/02/2013  . COPD (chronic obstructive pulmonary disease) ([place]) 02/02/2013  . Restrictive lung disease 02/02/2013   Deneise Lever, [place], [place] Speech-Language Pathologist  Aliene Altes 08/27/2017, 2:52 PM  [facility] [address] [place], [place], [address] Phone: [phone]   Fax:  [phone]  Name: SONYA GUNNOE MRN: [id number] Date of Birth: 1960/08/22

## 2017-08-27 NOTE — Patient Instructions (Signed)
VOICE CONSERVATION PROGRAM  1. Avoid overuse of voice or excessive use of the voice . The vocal cords can become easily fatigued. Try to sort out what is "necessary" versus "unnecessary" talking in your environment. You do not need to STOP talking, but try to limit it as much as possible.  . Think of resting your voice just as long as you talk. For example, if you talk for 5 minutes, rest your voice completely for 5 minutes. . If your voice feels "tired" during the day, try to rest it as much as possible.  2. Avoid using an excessively loud voice or shouting/raising your voice . When you yell or raise your voice, the vocal cords slam into each other, much like a strong hand clap. This causes irritation, and if this irritation continues, hoarseness may increase. . If people in your home talk loudly, ask them to reduce the volume of their voices to help you decrease your volume as well. . Sit near or face the person to whom you are speaking.   3. Avoid talking over background noise . When talking in background noise, speech automatically has increased loudness, and as in number 2 above, continued loud speech can result in increased hoarseness due to irritation to the vocal cords.  . Do not talk over the radio or the TV. Mute them before speaking, go to a quieter place to talk, or sit next to the person with whom you are watching.  4. Talk in a voice that is soft, smooth, and gentle . This allows the vocal cords to come together in a gentle way and allows the air being exhaled from the lungs to do most/all of the work when you are speaking.  5. Avoid excessive throat clearing, coughing, and loud laughter . During these activities the vocal cords can also be slammed together in a hard way and foster irritation or swelling, which can cause hoarseness. . Try taking sips of room temperature/cool water with hard swallows to clear secretions from the throat, instead of throat clearing or  coughing. . If you absolutely must clear your throat, do so as gently as possible. If you find yourself clearing your throat or coughing a lot, consult your physician. . You will want to laugh. Continue to do so, but softly and gently. Do not laugh loudly for long periods of time because this can increase the chances for vocal fold irritation and thus hoarseness. . Lozenges can help reduce the need to clear throat/cough during cold/allergy season.  6. Keep the mouth and throat lubricated . Drink at least 8-10 8 oz. glasses of water per day (64-80 oz.). This water can come in the form of drink mixes.  . Caffeinated beverages such as colas, coffee, and tea (hot tea AND sweet tea) dry out the vocal cords and then can cause irritation and thus hoarseness.  . Drinking water will keep your body hydrated. This will help to decrease secretions in the throat, which cause people to clear their throats or cough. . If you are exercising outside (especially during drier weather), try to breathe through your nose as much as possible. If this is not possible, lifting the tongue up behind the upper teeth when breathing through the mouth will add some moisture to the air breathed in past the vocal cords.  7. Avoid mouth breathing - breathe through your nose . Your nose serves as a natural filter for dust and dirt particles from the air, and as a humidifier to   moisten the air. Vocal cords like to work in moistened, filtered air. When you breathe through your mouth you lose the air filtering and moistening benefits of breathing through the nose. Marland Kitchen Be aware of breathing patterns when sitting quietly (e.g., reading or watching TV). Increase your awareness and try to change habits from mouth breathing to nose breathing during those times.  8. Avoid environmental and/or ingested irritants . Try to avoid smoke-filled and or dusty environments. These items dry out the vocal cords and cause irritation.  9. Use an air filter  if the home is dusty, and/or a humidifier if the air is dry  . This will help to maintain clean, humid air to breathe. Remember, this type of air is what the vocal cords like best.    Your signs and symptoms may be consistent with esophageal dysphagia. Only your doctor can diagnose esophageal dysphagia, please discuss with your physician.  What is reflux? Gastroesophageal Reflux Disease (GERD) commonly referred to as reflux, is a backflow of acid from the stomach into the swallowing tube or esophagus.  Some reflux is normal, but when it happens frequently, the acid can irritate and damage the lining on the inside of the esophagus.  The most common symptom is heartburn.  Laryngopharyngeal Reflux (LPR) is when the acid backflow reaches the throat.  The structures of the throat (pharynx, larynx) are much more sensitive to stomach acid, so there is increased risk of damage.  People with LPR often do not experience heartburn.  The more common symptoms of LPR include: hoarseness, chronic cough, frequent throat clearing, feeling a lump in the throat and problems swallowing.  There are many changes you can make in diet, positioning and in your lifestyle that can have a dramatic effect in preventing or stopping reflux.  They include:  Everyday: . Avoid tight or constricting clothing . Avoid smoking, or exposing yourself to second hand smoke . Avoid non-steroidal anti-inflammatory drugs (ibuprofen, Alleve) . Exercise regularly, reduce stress . Lose weight   Avoiding or limiting certain foods: . Spicy, acidic (tomato-based foods, citrus or vinegar-based foods) . Fruit juices such as orange, grapefruit or cranberry . Fried foods . Caffeine (such as coffee, tea, sodas)   . Carbonated beverages  . Chocolate  . Peppermint . Alcohol . Decrease Dairy . Decrease red meat . Any food that gives you symptoms            During and after meals: . Eat slowly and don't overeat at meals . Eat  several smaller meals a day, rather than larger ones . Do not lie down for at least  hour- 1 hour after meals . Avoid bending over or exercising after eating . Chewing gum (non-mint) for 20 minutes after each meal may be helpful . Drinking warm fluids with meals (i.e. warm decaffeinated tea) may help clear the esophagus better  Bedtime: . Avoid eating or drinking within 2-3 hours before bedtime, except for water . Elevate the head of the bed 6-8 inches with blocks, books, or wedge under your mattress (propping  yourself up on pillows may cause neck or back pain) . If you take medications at night, be sure to take them with a full glass of water

## 2017-08-28 ENCOUNTER — Other Ambulatory Visit (HOSPITAL_COMMUNITY): Payer: Self-pay | Admitting: Emergency Medicine

## 2017-08-28 DIAGNOSIS — R1310 Dysphagia, unspecified: Secondary | ICD-10-CM

## 2017-08-31 ENCOUNTER — Ambulatory Visit: Payer: 59 | Admitting: Speech Pathology

## 2017-08-31 DIAGNOSIS — R498 Other voice and resonance disorders: Secondary | ICD-10-CM

## 2017-08-31 NOTE — Patient Instructions (Signed)
  ABDOMINAL BREATHING FOR VCD ? ?Shoulders down - this is a cue to relax ?Place your hand on your abdomen - this helps you focus on easy abdominal breath support - the best and most relaxed way to breathe ?Breathe in through your nose and fill your belly with air, watching your hand move outward ?Breathe out through your mouth and watch your belly move in. An audible "sh"  may help ? ? ?Think of your belly as a balloon, when you fill with air (inhale), the balloon gets bigger. As the air goes out (exhale), the balloon deflates. ? ?If you are having difficulty coordinating this, lay on your back with a plastic cup on your belly and repeat the above steps, watching you belly move up with inhalation and down with exhalations ? ?Practice breathing in and out in front of a mirror, watching your belly ?Breathe in for a count of 5 and breathe out for a count of 5 ? ?Now as you breathe out, get a picture of relaxing in your mind ?Feel the constant in-out of your breathing with your belly ?Picture the tension in your throat and chest evaporate like steam, melting away and FEEL it do so ?Picture your throat opening up so wide that a grapefruit or softball could fit through your throat. ? ? ?Practice this throughout the day when you are not having symptoms. For example: in the car, when watching TV, before medications. Regular practice when you are feeling well is important. ? ?Make it automatic and use it at the first sense of throat tightness to prevent or suppress the VCD. You may start with the inhale or exhale. ? ?Be patient when completing the breathing. It may take several minutes to start feeling relief ? ?Use the breathing to "pre-treat" yourself before a known trigger for VCD. Possible triggers could be: change in air temperature, strong odors, perfume, and exercise. ? ? ?There's an App for that: Breathe2relax ? ? ?

## 2017-08-31 NOTE — Therapy (Signed)
[facility] [address] [place], [place], [address] Phone: [phone]   Fax:  [phone]  Speech Language Pathology Treatment  Patient Details  Name: Tracey Rodriguez MRN: [id number] Date of Birth: [birth year] Referring Provider: Collene Gobble, MD   Encounter Date: 08/31/2017  End of Session - 08/31/17 1738    Visit Number  2    Number of Visits  17    Date for SLP Re-Evaluation  10/23/17    SLP Start Time  1017    SLP Stop Time   1103    SLP Time Calculation (min)  46 min    Activity Tolerance  Patient tolerated treatment well       Past Medical History:  Diagnosis Date  . Arthritis    deg disc disease  - lower back, neck  . Cancer ([place])    left breast surgery-lumpectomy  . Factor V deficiency ([place])   . Fibromyalgia   . GERD (gastroesophageal reflux disease)   . Heart murmur   . Hyperlipidemia   . Hypothyroidism    goiter  . Neuromuscular disorder ([place])    sciatic nerve  . PONV (postoperative nausea and vomiting)   . SVD (spontaneous vaginal delivery)    x 2    Past Surgical History:  Procedure Laterality Date  . ABLATION    . APPENDECTOMY    . BREAST EXCISIONAL BIOPSY Left   . BREAST SURGERY     left- lumpectomy  . DILATATION & CURRETTAGE/HYSTEROSCOPY WITH RESECTOCOPE N/A 04/05/2012   Procedure: hysteroscopy with endocervical curretting;  Surgeon: Allyn Kenner, DO;  Location: [facility];  Service: Gynecology;  Laterality: N/A;  . DILATION AND CURETTAGE OF UTERUS    . NOSE SURGERY      There were no vitals filed for this visit.  Subjective Assessment - 08/31/17 1020    Subjective  "I still feel like i can't breathe when i'm trying to do something."    Currently in Pain?  No/denies            ADULT SLP TREATMENT - 08/31/17 1017      General Information   Behavior/Cognition  Alert;Cooperative      Treatment Provided   Treatment provided  Cognitive-Linquistic      Pain  Assessment   Pain Assessment  No/denies pain      Cognitive-Linquistic Treatment   Treatment focused on  Voice;Patient/family/caregiver education    Skilled Treatment  Pt reports trying to increase water intake. She continues to have coughing with meals, reports "thick clear mucuous comes up when I cough." She also experienced coughing/breathing difficulty when working outside this weekend. SLP educated re: avoiding triggers and trained pt in pursed lip breathing, abdominal breathing, semi-occluded vocal tract and relaxation techniques for VCD. Provided stretches for accessory muscles and laryngeal tension. Min-mod A demonstration and verbal cues required, SLP faded cues to supervision. Pt has MBS scheduled for Thursday. Cough journal provided and pt instructed to log her coughing episodes with meals and provide to SLP when she arrives for MBS.      Assessment / Recommendations / Plan   Plan  Continue with current plan of care      Progression Toward Goals   Progression toward goals  Progressing toward goals       SLP Education - 08/31/17 1025    Education Details  cough journal, breathing for VCD, vocal tract relaxation, stretches for HEP    Person(s) Educated  Patient  Methods  Explanation;Handout    Comprehension  Verbalized understanding       SLP Short Term Goals - 08/31/17 1739      SLP SHORT TERM GOAL #1   Title  Pt will report adhering to reflux guidelines and at least 3 vocal hygiene strategies over 3 sessions.    Time  4    Period  Weeks    Status  On-going      SLP SHORT TERM GOAL #2   Title  Pt will demonstrate laryngeal control strategies to reduce VCD symptoms over 3 sessions.    Time  4    Period  Weeks    Status  On-going      SLP SHORT TERM GOAL #3   Title  Pt will participate in MBSS for objective evaluation of swallow function.    Time  4    Period  Weeks    Status  On-going      SLP SHORT TERM GOAL #4   Title  Pt will demonstrate abdominal breathing  in 19/20 sentence level responses over 3 sessions.    Time  4    Period  Weeks    Status  On-going      SLP SHORT TERM GOAL #5   Title  Pt will demo HEP for VCD with good technique and rare min A over 3 visits.    Time  4    Period  Weeks    Status  On-going       SLP Long Term Goals - 08/31/17 1740      SLP LONG TERM GOAL #1   Title  Pt will carryover of reflux precautions and 6 vocal hygiene strategies over 5 sessions per pt report    Time  8    Period  Weeks    Status  On-going      SLP LONG TERM GOAL #2   Title  Pt will use abdominal breathing >80% of the time in 15 minutes mod complex conversation over 3 sessions.    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 Since then, she reports hoarseness, low pitch, vocal fatigue, and worsening dysphagia. Vocal quality is hoarse, gravelly, with intermittent pitch breaks/aphonia, and pitch is subjectively low for age/gender. Secondary neck and shoulder tension noted during conversation, with clavicular breathing pattern. She reports environmental triggers including exercise, smoke, pesticides and dusty roads. She has a history of GERD (takes Nexium) and reports 3 esophageal dilations, most recently 08/20/17. She states in the past this has alleviated her symptoms, however she  has continued to have coughing and choking episodes every meal. She "gets choked" on crumbly solids, as well as liquids and her own saliva occasionally. She describes globus sensation and reports unexplained fevers. She was observed with harsh throat clearing throughout the evaluation, although she has been trying to decrease this. She drinks 2 cups of caffeine daily but only drinks "a few sips" of water throughout the day. I recommend skilled ST to address dysphonia, improve vocal hygiene and vocal quality. I also recommend objective swallowing evaluation (MBSS) with dysphagia goals to be added PRN pending MBS results. Orders requested for MBS this visit.    Speech Therapy Frequency  2x / week    Treatment/Interventions  Aspiration precaution training;Diet toleration management by SLP;Trials of upgraded texture/liquids;Compensatory strategies;Pharyngeal strengthening exercises;Compensatory techniques;Cueing hierarchy;Internal/external aids;Functional tasks;SLP instruction and feedback;Patient/family education;Other (comment)    Potential to Achieve Goals  Good    SLP Home Exercise Plan  reflux precautions and vocal hygiene provided    Consulted and Agree with Plan of Care  Patient       Patient will benefit from skilled therapeutic intervention in order to improve the following deficits and impairments:   Other voice and resonance disorders    Problem List Patient Active Problem List   Diagnosis Date Noted  . Hoarseness 07/30/2017  . Gastroesophageal reflux disease 07/30/2017  . History of breast cancer 09/11/2016  . Other fatigue 03/15/2016  . Primary insomnia 03/15/2016  . Primary osteoarthritis of both hands 03/15/2016  . DDD cervical spine 03/15/2016  . 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 History of bilateral carpal tunnel release 03/15/2016  . Unspecified hypothyroidism 03/30/2013  . Fibromyalgia 03/30/2013  . Breast cancer ([facility]) 03/30/2013  . Factor V deficiency ([facility])  03/30/2013  . Dyspnea 02/02/2013  . COPD (chronic obstructive pulmonary disease) ([place]) 02/02/2013  . Restrictive lung disease 02/02/2013   Deneise Lever, [place], [place] 08/31/2017, 5:41 PM  [facility] [address] [place], [place], [address] Phone: [phone]   Fax:  [phone]   Name: Tracey Rodriguez MRN: [id number] Date of Birth: 07-01-1960

## 2017-09-03 ENCOUNTER — Ambulatory Visit (HOSPITAL_COMMUNITY)
Admission: RE | Admit: 2017-09-03 | Discharge: 2017-09-03 | Disposition: A | Discharge status: Home / Self Care | Payer: 59 | Source: Ambulatory Visit | Attending: Emergency Medicine | Admitting: Emergency Medicine

## 2017-09-03 ENCOUNTER — Ambulatory Visit: Payer: 59 | Admitting: Speech Pathology

## 2017-09-03 DIAGNOSIS — I10 Essential (primary) hypertension: Secondary | ICD-10-CM | POA: Insufficient documentation

## 2017-09-03 DIAGNOSIS — R1310 Dysphagia, unspecified: Secondary | ICD-10-CM | POA: Diagnosis not present

## 2017-09-03 DIAGNOSIS — Z87891 Personal history of nicotine dependence: Secondary | ICD-10-CM | POA: Insufficient documentation

## 2017-09-03 DIAGNOSIS — E039 Hypothyroidism, unspecified: Secondary | ICD-10-CM | POA: Diagnosis not present

## 2017-09-03 DIAGNOSIS — E785 Hyperlipidemia, unspecified: Secondary | ICD-10-CM | POA: Diagnosis not present

## 2017-09-03 DIAGNOSIS — R1312 Dysphagia, oropharyngeal phase: Secondary | ICD-10-CM | POA: Insufficient documentation

## 2017-09-03 DIAGNOSIS — K219 Gastro-esophageal reflux disease without esophagitis: Secondary | ICD-10-CM | POA: Insufficient documentation

## 2017-09-03 DIAGNOSIS — Z853 Personal history of malignant neoplasm of breast: Secondary | ICD-10-CM | POA: Diagnosis not present

## 2017-09-03 DIAGNOSIS — M797 Fibromyalgia: Secondary | ICD-10-CM | POA: Diagnosis not present

## 2017-09-03 DIAGNOSIS — D682 Hereditary deficiency of other clotting factors: Secondary | ICD-10-CM | POA: Insufficient documentation

## 2017-09-03 DIAGNOSIS — R498 Other voice and resonance disorders: Secondary | ICD-10-CM

## 2017-09-03 NOTE — Therapy (Signed)
[name] [address] [place], [place], [address] Phone: [phone]   Fax:  [phone]  Speech Language Pathology Treatment  Patient Details  Name: Tracey Rodriguez MRN: [id number] Date of Birth: 1960-06-30 Referring Provider: Collene Gobble, MD   Encounter Date: 09/03/2017  End of Session - 09/03/17 1154    Visit Number  3    Number of Visits  17    Date for SLP Re-Evaluation  10/23/17    SLP Start Time  11    SLP Stop Time   1100    SLP Time Calculation (min)  40 min    Activity Tolerance  Patient tolerated treatment well       Past Medical History:  Diagnosis Date  . Arthritis    deg disc disease  - lower back, neck  . Cancer ([place] [facility])    left breast surgery-lumpectomy  . Factor V deficiency ([place])   . Fibromyalgia   . GERD (gastroesophageal reflux disease)   . Heart murmur   . Hyperlipidemia   . Hypothyroidism    goiter  . Neuromuscular disorder ([place])    sciatic nerve  . PONV (postoperative nausea and vomiting)   . SVD (spontaneous vaginal delivery)    x 2    Past Surgical History:  Procedure Laterality Date  . ABLATION    . APPENDECTOMY    . BREAST EXCISIONAL BIOPSY Left   . BREAST SURGERY     left- lumpectomy  . DILATATION & CURRETTAGE/HYSTEROSCOPY WITH RESECTOCOPE N/A 04/05/2012   Procedure: hysteroscopy with endocervical curretting;  Surgeon: Allyn Kenner, DO;  Location: [facility];  Service: Gynecology;  Laterality: N/A;  . DILATION AND CURETTAGE OF UTERUS    . NOSE SURGERY      There were no vitals filed for this visit.  Subjective Assessment - 09/03/17 1022    Subjective  "I like to never get the hang of that straw thing again."    Currently in Pain?  No/denies            ADULT SLP TREATMENT - 09/03/17 1020      General Information   Behavior/Cognition  Alert;Cooperative      Treatment Provided   Treatment provided  Cognitive-Linquistic      Pain Assessment   Pain  Assessment  No/denies pain      Cognitive-Linquistic Treatment   Treatment focused on  Voice;Patient/family/caregiver education    Skilled Treatment  Pt reports avoiding environmental triggers, increasing water intake to 32-42 oz. Demo'd HEP for VCD/muscle tension with rare min A.  Abdominal breathing (AB) in isolation 60% accuracy. SLP worked with pt on AB in supine position, transitioning to upright position with improvement in awareness and accuracy, 85%. Automatic speech tasks and phrases with AB 80% accuracy and occasional min A.      Assessment / Recommendations / Plan   Plan  Continue with current plan of care      Progression Toward Goals   Progression toward goals  Progressing toward goals         SLP Short Term Goals - 09/03/17 1157      SLP SHORT TERM GOAL #1   Title  Pt will report adhering to reflux guidelines and at least 3 vocal hygiene strategies over 3 sessions.    Time  4    Period  Weeks    Status  On-going      SLP SHORT TERM GOAL #2   Title  Pt will demonstrate  laryngeal control strategies to reduce VCD symptoms over 3 sessions.    Time  4    Period  Weeks    Status  On-going      SLP SHORT TERM GOAL #3   Title  Pt will participate in MBSS for objective evaluation of swallow function.    Time  4    Period  Weeks    Status  On-going      SLP SHORT TERM GOAL #4   Time  4    Period  Weeks    Status  On-going      SLP SHORT TERM GOAL #5   Title  Pt will demo HEP for VCD with good technique and rare min A over 3 visits.    Time  4    Period  Weeks    Status  On-going       SLP Long Term Goals - 09/03/17 1158      SLP LONG TERM GOAL #1   Title  Pt will carryover of reflux precautions and 6 vocal hygiene strategies over 5 sessions per pt report    Time  8    Period  Weeks    Status  On-going      SLP LONG TERM GOAL #2   Title  Pt will use abdominal breathing >80% of the time in 15 minutes mod complex conversation over 3 sessions.    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 Pt reports increasing water intake and avoiding environmental triggers this week. Pt required rare min A for HEP and VCD strategies today. I recommend skilled ST to address dysphonia, improve vocal hygiene and vocal quality. MBS is scheduled for today; dysphagia goals to be added PRN pending MBS results.    Speech Therapy Frequency  2x / week    Treatment/Interventions  Aspiration precaution training;Diet toleration management by SLP;Trials of upgraded texture/liquids;Compensatory strategies;Pharyngeal strengthening exercises;Compensatory techniques;Cueing hierarchy;Internal/external aids;Functional tasks;SLP instruction and feedback;Patient/family education;Other (comment)    Potential to Achieve Goals  Good    SLP Home Exercise Plan  reflux precautions and vocal hygiene provided    Consulted and Agree with Plan of Care  Patient       Patient will benefit from skilled therapeutic intervention in order to improve the following deficits and impairments:   Other voice and resonance disorders    Problem List Patient Active Problem List   Diagnosis Date Noted  . Hoarseness 07/30/2017  . Gastroesophageal reflux disease 07/30/2017  . History of breast cancer 09/11/2016  . Other fatigue 03/15/2016  . Primary insomnia 03/15/2016  . Primary osteoarthritis of both hands 03/15/2016  . DDD cervical spine 03/15/2016  . 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 History of bilateral  carpal tunnel release  03/15/2016  . Unspecified hypothyroidism 03/30/2013  . Fibromyalgia 03/30/2013  . Breast cancer ([place]) 03/30/2013  . Factor V deficiency ([place]) 03/30/2013  . Dyspnea 02/02/2013  . COPD (chronic obstructive pulmonary disease) ([facility]) 02/02/2013  . Restrictive lung disease 02/02/2013   Deneise Lever, [place], [place] 09/03/2017, 12:01 PM  [facility] [address] [place], [place], [address] Phone: [phone]   Fax:  [phone]   Name: MABRY SANTARELLI MRN: [id number] Date of Birth: [birth year]

## 2017-09-03 NOTE — Progress Notes (Signed)
Modified Barium Swallow Progress Note  Patient Details  Name: EVEA SHEEK MRN: [id number] Date of Birth: [birth year]  Today's Date: 09/03/2017  Modified Barium Swallow completed.  Full report located under Chart Review in the Imaging Section.  Brief recommendations include the following:  Clinical Impression  Pt demonstrates normal oral and pharyngeal function. Barium tablet lodged mid esophagus with pt report of globus. Pill transited with puree. Defer recommendations to primary SLP.    Swallow Evaluation Recommendations       SLP Diet Recommendations: Regular solids;Thin liquid   Liquid Administration via: Cup;Straw   Medication Administration: Whole meds with liquid(follow with bites of pudding/puree/etc)   Supervision: Patient able to self feed           Oral Care Recommendations: Patient independent with oral care        Rileyann Florance, Katherene Ponto 09/03/2017,12:28 PM

## 2017-09-07 ENCOUNTER — Ambulatory Visit: Payer: 59 | Admitting: Speech Pathology

## 2017-09-07 DIAGNOSIS — R498 Other voice and resonance disorders: Secondary | ICD-10-CM

## 2017-09-07 NOTE — Therapy (Signed)
[facility] [address] [place], [place], [address] Phone: [phone]   Fax:  [phone]  Speech Language Pathology Treatment  Patient Details  Name: Tracey Rodriguez MRN: [id number] Date of Birth: [birth year] Referring Provider: Collene Gobble, MD   Encounter Date: 09/07/2017  End of Session - 09/07/17 1220    Visit Number  4    Number of Visits  17    Date for SLP Re-Evaluation  10/23/17    SLP Start Time  1017    SLP Stop Time   1059    SLP Time Calculation (min)  42 min    Activity Tolerance  Patient tolerated treatment well       Past Medical History:  Diagnosis Date  . Arthritis    deg disc disease  - lower back, neck  . Cancer ([facility])    left breast surgery-lumpectomy  . Factor V deficiency ([place])   . Fibromyalgia   . GERD (gastroesophageal reflux disease)   . Heart murmur   . Hyperlipidemia   . Hypothyroidism    goiter  . Neuromuscular disorder ([facility])    sciatic nerve  . PONV (postoperative nausea and vomiting)   . SVD (spontaneous vaginal delivery)    x 2    Past Surgical History:  Procedure Laterality Date  . ABLATION    . APPENDECTOMY    . BREAST EXCISIONAL BIOPSY Left   . BREAST SURGERY     left- lumpectomy  . DILATATION & CURRETTAGE/HYSTEROSCOPY WITH RESECTOCOPE N/A 04/05/2012   Procedure: hysteroscopy with endocervical curretting;  Surgeon: Allyn Kenner, DO;  Location: [facility];  Service: Gynecology;  Laterality: N/A;  . DILATION AND CURETTAGE OF UTERUS    . NOSE SURGERY      There were no vitals filed for this visit.  Subjective Assessment - 09/07/17 1018    Subjective  "It was good, and then when we got back to the house it went a little downhill." pt reports excessive voice use over the weekend    Currently in Pain?  No/denies            ADULT SLP TREATMENT - 09/07/17 1017      General Information   Behavior/Cognition  Alert;Cooperative      Treatment Provided   Treatment provided  Cognitive-Linquistic      Pain Assessment   Pain Assessment  No/denies pain      Cognitive-Linquistic Treatment   Treatment focused on  Voice;Patient/family/caregiver education    Skilled Treatment  Pt reports hoarseness after a trip to [place] over the weekend for a family wedding. She reports talking at length over the weekend, and not drinking much water prior to her flight home. 7 throat clears noted today, with occasional awareness. SLP re-educated re: vocal hygiene strategies. Pt demo'd HEP with modified independence. Worked with pt on abdominal breathing (AB) at rest, 90% accuracy over 3 minutes. Progressed to phrases with AB with initial min A required for breath support, fading to supervision, 90% accuracy. In sentence level tasks with increased cognitive load, occasional min A required for AB. Limited carryover seen in conversation and spontaneous responses between tasks, although she is demonstrating improving awareness of instances of sub WNL vocal quality, and states she has tried to take a breath and repeat herself when she notices this at home.       Assessment / Recommendations / Plan   Plan  Continue with current plan of care      Progression  Toward Goals   Progression toward goals  Progressing toward goals         SLP Short Term Goals - 09/07/17 1226      SLP SHORT TERM GOAL #1   Title  Pt will report adhering to reflux guidelines and at least 3 vocal hygiene strategies over 3 sessions.    Time  3    Period  Weeks    Status  On-going      SLP SHORT TERM GOAL #2   Title  Pt will demonstrate laryngeal control strategies to reduce VCD symptoms over 3 sessions.    Baseline  09/07/17    Time  3    Period  Weeks    Status  On-going      SLP SHORT TERM GOAL #3   Title  Pt will participate in MBSS for objective evaluation of swallow function.    Time  3    Period  Weeks    Status  Achieved      SLP SHORT TERM GOAL #4   Title  Pt will demonstrate  abdominal breathing in 19/20 sentence level responses over 3 sessions.    Time  3    Period  Weeks    Status  On-going      SLP SHORT TERM GOAL #5   Title  Pt will demo HEP for VCD with good technique and rare min A over 3 visits.    Baseline  09/07/17    Time  3    Period  Weeks    Status  On-going       SLP Long Term Goals - 09/07/17 1227      SLP LONG TERM GOAL #1   Title  Pt will carryover of reflux precautions and 6 vocal hygiene strategies over 5 sessions per pt report    Time  7    Period  Weeks    Status  On-going      SLP LONG TERM GOAL #2   Title  Pt will use abdominal breathing >80% of the time in 15 minutes mod complex conversation over 3 sessions.    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 Decreased carryover of vocal hygiene strategies reported due to pt travelling over the weekend. Pt demo'd HEP and VCD strategies today with modified independence. Improving awareness of sub-WNL vocal quality, though without carryover into conversation or spontaneous responses. I recommend skilled ST to address dysphonia, improve vocal hygiene and vocal quality. MBS revealed normal oropharyngeal swallow function; barium tablet was noted to lodge mid-esophagus with pt reporting sensation at thyroid notch. Dysphagia goals will not be added at this time; education provided re: strategies for management of GERD.     Speech Therapy Frequency  2x / week  Treatment/Interventions  Aspiration precaution  training;Diet toleration management by SLP;Trials of upgraded texture/liquids;Compensatory strategies;Pharyngeal strengthening exercises;Compensatory techniques;Cueing hierarchy;Internal/external aids;Functional tasks;SLP instruction and feedback;Patient/family education;Other (comment)    Potential to Achieve Goals  Good    SLP Home Exercise Plan  reflux precautions and vocal hygiene provided    Consulted and Agree with Plan of Care  Patient       Patient will benefit from skilled therapeutic intervention in order to improve the following deficits and impairments:   Other voice and resonance disorders    Problem List Patient Active Problem List   Diagnosis Date Noted  . Hoarseness 07/30/2017  . Gastroesophageal reflux disease 07/30/2017  . History of breast cancer 09/11/2016  . Other fatigue 03/15/2016  . Primary insomnia 03/15/2016  . Primary osteoarthritis of both hands 03/15/2016  . DDD cervical spine 03/15/2016  . 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 History of bilateral carpal tunnel release 03/15/2016  . Unspecified hypothyroidism 03/30/2013  . Fibromyalgia 03/30/2013  . Breast cancer ([facility]) 03/30/2013  . Factor V deficiency ([facility]) 03/30/2013  . Dyspnea 02/02/2013  . COPD (chronic obstructive pulmonary disease) ([place]) 02/02/2013  . Restrictive lung disease 02/02/2013   Deneise Lever, [place], [place] Speech-Language Pathologist  Aliene Altes 09/07/2017, 12:28 PM  [facility] [address] [place], [place], [address] Phone: [phone]   Fax:  [phone]   Name: Tracey Rodriguez MRN: [id number] Date of Birth: Nov 07, 1960

## 2017-09-15 NOTE — Progress Notes (Signed)
Office Visit Note  Patient: Tracey Rodriguez             Date of Birth: 11-28-1960           MRN: [id number]             PCP: Aura Dials, MD Referring: Aura Dials, MD Visit Date: 09/29/2017 Occupation: @GUAROCC @  Subjective:  Low-grade fever  History of Present Illness: Tracey Rodriguez is a 57 y.o. female with history of fibromyalgia, osteoarthritis, and DDD.  Patient reports that for the last 2 months she has been having low-grade fever anywhere from 99 to 101.5.  Is been given antibiotics and courses of prednisone without much help.  He denies any joint swelling.  There is no history of rash.  Feels some stiffness in her hands.  Activities of Daily Living:  Patient reports morning stiffness for 30 minutes.   Patient Reports nocturnal pain.  Difficulty dressing/grooming: Denies Difficulty climbing stairs: Reports Difficulty getting out of chair: Reports Difficulty using hands for taps, buttons, cutlery, and/or writing: Reports  Review of Systems  Constitutional: Positive for fatigue. Negative for night sweats, weight gain and weight loss.  HENT: Negative for mouth sores, trouble swallowing, trouble swallowing, mouth dryness and nose dryness.   Eyes: Positive for dryness. Negative for pain, redness and visual disturbance.  Respiratory: Negative for cough, hemoptysis, shortness of breath and difficulty breathing.   Cardiovascular: Negative for chest pain, palpitations, hypertension, irregular heartbeat and swelling in legs/feet.  Gastrointestinal: Negative for blood in stool, constipation and diarrhea.  Endocrine: Negative for increased urination.  Genitourinary: Negative for difficulty urinating, painful urination and vaginal dryness.  Musculoskeletal: Positive for arthralgias, joint pain, myalgias, morning stiffness and myalgias. Negative for joint swelling, muscle weakness and muscle tenderness.  Skin: Negative for color change, pallor, rash, hair loss,  nodules/bumps, skin tightness, ulcers and sensitivity to sunlight.  Allergic/Immunologic: Negative for susceptible to infections.  Neurological: Negative for dizziness, numbness, headaches, memory loss, night sweats and weakness.  Hematological: Negative for swollen glands.  Psychiatric/Behavioral: Positive for sleep disturbance. Negative for depressed mood. The patient is not nervous/anxious.     PMFS History:  Patient Active Problem List   Diagnosis Date Noted  . Hoarseness 07/30/2017  . Gastroesophageal reflux disease 07/30/2017  . History of breast cancer 09/11/2016  . Other fatigue 03/15/2016  . Primary insomnia 03/15/2016  . Primary osteoarthritis of both hands 03/15/2016  . DDD cervical spine 03/15/2016  . 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 History of bilateral carpal tunnel release 03/15/2016  . Unspecified hypothyroidism 03/30/2013  . Fibromyalgia 03/30/2013  . Breast cancer ([place]) 03/30/2013  . 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 Dyspnea 02/02/2013  . COPD (chronic obstructive pulmonary disease) ([place]) 02/02/2013  . Restrictive lung disease 02/02/2013    Past Medical History:  Diagnosis Date  . Arthritis    deg disc disease  - lower back, neck  . Cancer ([place])    left breast surgery-lumpectomy  . Disorder of vocal cord   . Factor V deficiency ([place])   . Fibromyalgia   . GERD (gastroesophageal reflux disease)   . Heart murmur   . Hyperlipidemia   . Hypothyroidism    goiter  . Neuromuscular disorder ([place])    sciatic nerve  . PONV (postoperative nausea and vomiting)   . SVD (spontaneous vaginal delivery)    x 2    Family History  Problem Relation Age of Onset  . Heart disease Father 35  . Heart  attack Father   . Clotting disorder Father   . Rheumatologic disease Father   . Crohn's disease Father   . Heart disease Maternal Grandfather   . Lung cancer Maternal Grandfather   . Breast cancer Mother 63  . Ovarian cancer Mother   . Liver cancer  Maternal Grandmother   . Heart attack Brother   . Cancer Brother   . Lung cancer Brother   . Colon polyps Brother   . Colon polyps Brother   . Breast cancer Maternal Aunt 60  . Kidney disease Paternal Uncle   . Irritable bowel syndrome Son    Past Surgical History:  Procedure Laterality Date  . ABLATION    . APPENDECTOMY    . BREAST EXCISIONAL BIOPSY Left   . BREAST SURGERY     left- lumpectomy  . DILATATION & CURRETTAGE/HYSTEROSCOPY WITH RESECTOCOPE N/A 04/05/2012   Procedure: hysteroscopy with endocervical curretting;  Surgeon: Allyn Kenner, DO;  Location: [facility];  Service: Gynecology;  Laterality: N/A;  . DILATION AND CURETTAGE OF UTERUS    . NOSE SURGERY     Social History   Social History Narrative  . Not on file    Objective: Vital Signs: BP (!) 143/73 (BP Location: Left Arm, Patient Position: Sitting, Cuff Size: Normal)   Pulse 74   Resp 16   Ht 5\' 6"  (1.676 m)   Wt 210 lb 3.2 oz (95.3 kg)   BMI 33.93 kg/m    Physical Exam  Constitutional: She is oriented to person, place, and time. She appears well-developed and well-nourished.  HENT:  Head: Normocephalic and atraumatic.  Eyes: Conjunctivae and EOM are normal.  Neck: Normal range of motion.  Cardiovascular: Normal rate, regular rhythm, normal heart sounds and intact distal pulses.  Pulmonary/Chest: Effort normal and breath sounds normal.  Abdominal: Soft. Bowel sounds are normal.  Lymphadenopathy:    She has no cervical adenopathy.  Neurological: She is alert and oriented to person, place, and time.  Skin: Skin is warm and dry. Capillary refill takes less than 2 seconds.  Psychiatric: She has a normal mood and affect. Her behavior is normal.  Nursing note and vitals reviewed.    Musculoskeletal Exam: C-spine thoracic and lumbar spine: Limited range of motion.  She has discomfort over bilateral SI joints.  Shoulder joints elbow joints wrist joint MCPs PIPs DIPs were in good range of motion.  She has some  PIP and DIP thickening in her hands.  Hip joints knee joints ankles MTPs PIPs been good range of motion with no synovitis.  She has some generalized hyperal algesia and positive tender points.  CDAI Exam: No CDAI exam completed.   Investigation: No additional findings.  Imaging: Dg Swallowing Func-speech Pathology  Result Date: 09/03/2017 Objective Swallowing Evaluation: Type of Study: MBS-Modified Barium Swallow Study  Patient Details Name: JAVIA DILLOW MRN: [id number] Date of Birth: [birth year] Today's Date: 09/03/2017 Time: SLP Start Time (ACUTE ONLY): 1130 -SLP Stop Time (ACUTE ONLY): 1145 SLP Time Calculation (min) (ACUTE ONLY): 15 min Past Medical History: Past Medical History: Diagnosis Date . Arthritis   deg disc disease  - lower back, neck . Cancer ([place])   left breast surgery-lumpectomy . Factor V deficiency ([place])  . Fibromyalgia  . GERD (gastroesophageal reflux disease)  . Heart murmur  . Hyperlipidemia  . Hypothyroidism   goiter . Neuromuscular disorder ([place])   sciatic nerve . PONV (postoperative nausea and vomiting)  . SVD (spontaneous vaginal delivery)   x 2 Past Surgical  History: Past Surgical History: Procedure Laterality Date . ABLATION   . APPENDECTOMY   . BREAST EXCISIONAL BIOPSY Left  . BREAST SURGERY    left- lumpectomy . DILATATION & CURRETTAGE/HYSTEROSCOPY WITH RESECTOCOPE N/A 04/05/2012  Procedure: hysteroscopy with endocervical curretting;  Surgeon: Allyn Kenner, DO;  Location: [facility];  Service: Gynecology;  Laterality: N/A; . DILATION AND CURETTAGE OF UTERUS   . NOSE SURGERY   HPI: 57 year old former smoker with a history of hypertension, breast cancer, GERD, hypothyroidism. Referred to OP SLP by pulmonologist Dr. Lamonte Sakai for possible vocal cord dysfunction. She was evaluated by ENT 07/27/2017 and laryngoscopy revealed a septal perforation, normal pharyngeal and glottic anatomy, incomplete closure of the vocal cords and a posterior glottic chink with phonation consistent with some  muscle tension. Has been treated by PCP with abx for ? pharyngitis v bronchitis. 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 Pt reports an event of exposure to inhaled chemicals with hypersensitive coughing ever since. Globus is accompanied by hard coughing, sneezing and expectoration of mucous.  No data recorded Assessment / Plan / Recommendation CHL IP CLINICAL IMPRESSIONS 09/03/2017 Clinical Impression Pt demonstrates normal oral and pharyngeal function. Barium tablet lodged mid esophagus with pt report of globus. Pill transited with puree. Defer recommendations to primary SLP.  SLP Visit Diagnosis Dysphagia, unspecified (R13.10) Attention and concentration deficit following -- Frontal lobe and executive function deficit following -- Impact on safety and function --   CHL IP TREATMENT RECOMMENDATION 09/03/2017 Treatment Recommendations Defer treatment plan to f/u with SLP   No flowsheet data found. CHL IP DIET RECOMMENDATION 09/03/2017 SLP Diet Recommendations Regular solids;Thin liquid Liquid Administration via Cup;Straw Medication Administration Whole meds with liquid Compensations -- Postural Changes --   CHL IP OTHER RECOMMENDATIONS 09/03/2017 Recommended Consults -- Oral Care Recommendations Patient independent with oral care Other Recommendations --   No flowsheet data found.  No flowsheet data found.     No flowsheet data found. CHL IP PHARYNGEAL PHASE 09/03/2017 Pharyngeal Phase WFL Pharyngeal- Pudding Teaspoon -- Pharyngeal -- Pharyngeal- Pudding Cup -- Pharyngeal -- Pharyngeal- Honey Teaspoon -- Pharyngeal -- Pharyngeal- Honey Cup -- Pharyngeal -- Pharyngeal- Nectar Teaspoon -- Pharyngeal -- Pharyngeal- Nectar Cup -- Pharyngeal -- Pharyngeal- Nectar Straw -- Pharyngeal -- Pharyngeal- Thin Teaspoon -- Pharyngeal -- Pharyngeal- Thin Cup --  Pharyngeal -- Pharyngeal- Thin Straw -- Pharyngeal -- Pharyngeal- Puree -- Pharyngeal -- Pharyngeal- Mechanical Soft -- Pharyngeal -- Pharyngeal- Regular -- Pharyngeal -- Pharyngeal- Multi-consistency -- Pharyngeal -- Pharyngeal- Pill -- Pharyngeal -- Pharyngeal Comment --  CHL IP CERVICAL ESOPHAGEAL PHASE 09/03/2017 Cervical Esophageal Phase WFL Pudding Teaspoon -- Pudding Cup -- Honey Teaspoon -- Honey Cup -- Nectar Teaspoon -- Nectar Cup -- Nectar Straw -- Thin Teaspoon -- Thin Cup -- Thin Straw -- Puree -- Mechanical Soft -- Regular -- Multi-consistency -- Pill -- Cervical Esophageal Comment -- No flowsheet data found. Herbie Baltimore, MA CCC-SLP [phone] Lynann Beaver 09/03/2017, 12:28 PM               Recent Labs: Lab Results  Component Value Date   WBC 9.1 03/29/2013   HGB 14.2 03/29/2013   PLT 288.0 03/29/2013   NA 138 03/27/2013   K 3.9 03/27/2013   CL 96 03/27/2013   CO2 26 03/27/2013   GLUCOSE 139 (H) 03/27/2013   BUN 18 03/27/2013   CREATININE 0.88  03/27/2013   BILITOT 0.3 03/27/2013   ALKPHOS 72 03/27/2013   AST 21 03/27/2013   ALT 25 03/27/2013   PROT 8.8 (H) 03/27/2013   ALBUMIN 4.4 03/27/2013   CALCIUM 9.7 03/27/2013   GFRAA 86 (L) 03/27/2013    Speciality Comments: No specialty comments available.  Procedures:  No procedures performed Allergies: Capsaicin; Etodolac; Bee venom; Doxycycline; Pepto-bismol [bismuth]; Pork-derived products; and Sulfonamide derivatives   Assessment / Plan:     Visit Diagnoses: Fibromyalgia-generalized pain discomfort and positive tender points.  Primary insomnia-good sleep hygiene was discussed.  Other fatigue -she has been experiencing increased fatigue and also low-grade fevers.  She states she had 2 courses of antibiotics and prednisone injections.  I will obtain following labs today.  Plan: CBC with Differential/Platelet, COMPLETE METABOLIC PANEL WITH GFR, Urinalysis, Routine w reflex microscopic, Sedimentation rate, ANA,  Cyclic citrul peptide antibody, IgG, Rheumatoid factor, CK, TSH, VITAMIN D 25 Hydroxy (Vit-D Deficiency, Fractures), Serum protein electrophoresis with reflex  Primary osteoarthritis of both hands-joint protection muscle strengthening was discussed.  DDD (degenerative disc disease), cervical-she has chronic discomfort and limited range of motion.  DDD (degenerative disc disease), lumbar-she has discomfort range of motion of lumbar spine.  Chronic SI joint pain-she has tenderness over bilateral SI joints.  History of COPD - patient has appointment coming up with pulmonologist.  Factor V deficiency ([place])  History of breast cancer  History of bilateral carpal tunnel release   Orders: Orders Placed This Encounter  Procedures  . CBC with Differential/Platelet  . COMPLETE METABOLIC PANEL WITH GFR  . Urinalysis, Routine w reflex microscopic  . Sedimentation rate  . ANA  . Cyclic citrul peptide antibody, IgG  . Rheumatoid factor  . CK  . TSH  . VITAMIN D 25 Hydroxy (Vit-D Deficiency, Fractures)  . Serum protein electrophoresis with reflex   No orders of the defined types were placed in this encounter.   Face-to-face time spent with patient was 30 minutes. Greater than 50% of time was spent in counseling and coordination of care.  Follow-Up Instructions: Return for Fibromyalgia, Osteoarthritis, DDD.   Bo Merino, MD  Note - This record has been created using Editor, commissioning.  Chart creation errors have been sought, but may not always  have been located. Such creation errors do not reflect on  the standard of medical care.

## 2017-09-16 ENCOUNTER — Ambulatory Visit: Payer: 59 | Admitting: Rheumatology

## 2017-09-22 DIAGNOSIS — H268 Other specified cataract: Secondary | ICD-10-CM | POA: Diagnosis not present

## 2017-09-22 DIAGNOSIS — H04123 Dry eye syndrome of bilateral lacrimal glands: Secondary | ICD-10-CM | POA: Diagnosis not present

## 2017-09-22 DIAGNOSIS — H18613 Keratoconus, stable, bilateral: Secondary | ICD-10-CM | POA: Diagnosis not present

## 2017-09-24 ENCOUNTER — Telehealth: Payer: Self-pay | Admitting: Internal Medicine

## 2017-09-24 ENCOUNTER — Ambulatory Visit: Payer: 59 | Attending: Emergency Medicine | Admitting: Speech Pathology

## 2017-09-24 DIAGNOSIS — R498 Other voice and resonance disorders: Secondary | ICD-10-CM

## 2017-09-24 MED ORDER — ESOMEPRAZOLE MAGNESIUM 40 MG PO CPDR: 40.0000 mg | DELAYED_RELEASE_CAPSULE | Freq: Two times a day (BID) | ORAL | 1 refills | Status: DC

## 2017-09-24 NOTE — Telephone Encounter (Signed)
Left her a message to call me back to let me know if she wants it sent to the local or mail order pharmacy.

## 2017-09-24 NOTE — Telephone Encounter (Signed)
Please send Nexium to local [facility] on [address] please

## 2017-09-24 NOTE — Telephone Encounter (Signed)
90 day supply of Nexium sent in as requested to local pharmacy.

## 2017-09-24 NOTE — Patient Instructions (Signed)
Strategies to manage VCD or coughing episodes  - Abdominal breathing (you can also you this to "pre-treat" if you know you are going to encounter one of your triggers - Avoid your triggers as much as you can (avoid going outside in poor air quality, etc) - Try your relaxation techniques (breathing, neck stretches, etc) - Breathe in through your nose, then exhale through pursed lips - Straw exercises (you can do a gentle voicing through the straw "ooooh") - Manage your reflux- see your reflux handout: remember to avoid troublesome foods, try alternating your solid and liquids, if you feel pills are sticking, try them with some yogurt instead, sit upright when you eat and for 30 minutes after, keep the head of your bed slightly elevated - Try not to clear your throat - Continue drinking 6-8 glasses of water daily.

## 2017-09-24 NOTE — Therapy (Signed)
[facility] [address] [place], [place], [address] Phone: [phone]   Fax:  [phone]  Speech Language Pathology Treatment and Discharge Summary  Patient Details  Name: Tracey Rodriguez MRN: [id number] Date of Birth: [birth year] Referring Provider: Collene Gobble, MD   Encounter Date: 09/24/2017  End of Session - 09/24/17 1646    Visit Number  5    Number of Visits  17    Date for SLP Re-Evaluation  10/23/17    SLP Start Time  1615    SLP Stop Time   1644    SLP Time Calculation (min)  29 min    Activity Tolerance  Patient tolerated treatment well       Past Medical History:  Diagnosis Date  . Arthritis    deg disc disease  - lower back, neck  . Cancer ([place])    left breast surgery-lumpectomy  . Factor V deficiency ([place])   . Fibromyalgia   . GERD (gastroesophageal reflux disease)   . Heart murmur   . Hyperlipidemia   . Hypothyroidism    goiter  . Neuromuscular disorder ([place])    sciatic nerve  . PONV (postoperative nausea and vomiting)   . SVD (spontaneous vaginal delivery)    x 2    Past Surgical History:  Procedure Laterality Date  . ABLATION    . APPENDECTOMY    . BREAST EXCISIONAL BIOPSY Left   . BREAST SURGERY     left- lumpectomy  . DILATATION & CURRETTAGE/HYSTEROSCOPY WITH RESECTOCOPE N/A 04/05/2012   Procedure: hysteroscopy with endocervical curretting;  Surgeon: Allyn Kenner, DO;  Location: [facility];  Service: Gynecology;  Laterality: N/A;  . DILATION AND CURETTAGE OF UTERUS    . NOSE SURGERY      There were no vitals filed for this visit.  Subjective Assessment - 09/24/17 1617    Subjective  "It's a lot stronger, and it's better."     Currently in Pain?  No/denies            ADULT SLP TREATMENT - 09/24/17 1615      General Information   Behavior/Cognition  Alert;Cooperative      Treatment Provided   Treatment provided  Cognitive-Linquistic      Pain Assessment   Pain Assessment  No/denies pain      Cognitive-Linquistic Treatment   Treatment focused on  Voice;Patient/family/caregiver education    Skilled Treatment  Pt requesting d/c today. SLP reviewed VCD strategies with pt as well as strategies to manage reflux. Pt demo'd VCD strategies with modified independence after review. Abdominal breathing at sentence level 90% accurate.      Assessment / Recommendations / Plan   Plan  Discharge SLP treatment due to (comment)   pt's request     Progression Toward Goals   Progression toward goals  --   d/c at pt's request; LTGs not met      SLP Education - 09/24/17 1646    Education Details  strategies to manage VCD    Person(s) Educated  Patient    Methods  Explanation    Comprehension  Verbalized understanding       SLP Short Term Goals - 09/24/17 1650      SLP SHORT TERM GOAL #1   Title  Pt will report adhering to reflux guidelines and at least 3 vocal hygiene strategies over 3 sessions.    Time  2    Period  Weeks    Status  Partially Met      SLP SHORT TERM GOAL #2   Title  Pt will demonstrate laryngeal control strategies to reduce VCD symptoms over 3 sessions.    Baseline  09/07/17, 09/24/17    Time  2    Period  Weeks    Status  Partially Met      SLP SHORT TERM GOAL #3   Title  Pt will participate in MBSS for objective evaluation of swallow function.    Status  Achieved      SLP SHORT TERM GOAL #4   Title  Pt will demonstrate abdominal breathing in 19/20 sentence level responses over 3 sessions.    Time  2    Period  Weeks    Status  Partially Met      SLP SHORT TERM GOAL #5   Title  Pt will demo HEP for VCD with good technique and rare min A over 3 visits.    Baseline  09/07/17 09/24/17    Time  2    Status  Partially Met       SLP Long Term Goals - 09/24/17 1652      SLP LONG TERM GOAL #1   Title  Pt will carryover of reflux precautions and 6 vocal hygiene strategies over 5 sessions per pt report    Time  6    Period   Weeks    Status  Not Met      SLP LONG TERM GOAL #2   Title  Pt will use abdominal breathing >80% of the time in 15 minutes mod complex conversation over 3 sessions.    Time  6    Period  Weeks    Status  Not Met      SLP LONG TERM GOAL #3   Title  pt will demo HEP for VCD/vocal function independently over 3 sessions    Time  6    Period  Weeks    Status  Not Met      SLP LONG TERM GOAL #4   Title  Pt will report improved voice-related quality of life per VRQOL    Time  6    Period  Weeks    Status  Not Met       Plan - 09/24/17 1647    Clinical Impression Statement  Pt reports improvement in her vocal quality and endurance over the last 2 weeks. She reports she did have 2 exacerbations of coughing during her trip to [place]. She requests discharge today due to financial reasons as she is planning to begin physical therapy. SLP reviewed VCD strategies with pt as well as strategies to manage reflux. Long term goals not met due to insufficient time to address. Pt is discharged today at her request; all education completed.    Speech Therapy Frequency  --   d/c   Duration  --   d/c   Treatment/Interventions  Aspiration precaution training;Diet toleration management by SLP;Trials of upgraded texture/liquids;Compensatory strategies;Pharyngeal strengthening exercises;Compensatory techniques;Cueing hierarchy;Internal/external aids;Functional tasks;SLP instruction and feedback;Patient/family education;Other (comment)    Potential to Achieve Goals  Good    Consulted and Agree with Plan of Care  Patient       Patient will benefit from skilled therapeutic intervention in order to improve the following deficits and impairments:   Other voice and resonance disorders    Problem List Patient Active Problem List   Diagnosis Date Noted  . Hoarseness 07/30/2017  . Gastroesophageal reflux disease 07/30/2017  .  History of breast cancer 09/11/2016  . Other fatigue 03/15/2016  . Primary  insomnia 03/15/2016  . Primary osteoarthritis of both hands 03/15/2016  . DDD cervical spine 03/15/2016  . 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 History of bilateral carpal tunnel release 03/15/2016  . Unspecified hypothyroidism 03/30/2013  . Fibromyalgia 03/30/2013  . Breast cancer ([place]) 03/30/2013  . 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 Dyspnea 02/02/2013  . COPD (chronic obstructive pulmonary disease) ([place]) 02/02/2013  . Restrictive lung disease 02/02/2013   SPEECH THERAPY DISCHARGE SUMMARY  Visits from Start of Care: 5  Current functional level related to goals / functional outcomes: Pt able to perform strategies to manage VCD with modified independence today. Vocal quality has improved, although pt reports continued intermittent hoarseness.    Remaining deficits: Pt continues with intermittent hoarseness and decreased vocal endurance, exacerbated by environmental triggers of her VCD (air quality, smoke, chemicals, exercise, post-nasal drip, reflux).   Education / Equipment: Strategies to manage VCD and reflux Plan: Patient agrees to discharge.  Patient goals were not met. Patient is being discharged due to the patient's request.  ?????         Deneise Lever, [place], CCC-SLP Speech-Language Pathologist  Aliene Altes 09/24/2017, 4:55 PM  [facility] [address] [place], [place], [address] Phone: [phone]   Fax:  [phone]   Name: Tracey Rodriguez MRN: [id number] Date of Birth: [birth year]

## 2017-09-25 ENCOUNTER — Ambulatory Visit: Payer: 59

## 2017-09-29 ENCOUNTER — Ambulatory Visit (INDEPENDENT_AMBULATORY_CARE_PROVIDER_SITE_OTHER): Payer: 59 | Admitting: Rheumatology

## 2017-09-29 ENCOUNTER — Encounter: Payer: Self-pay | Admitting: Rheumatology

## 2017-09-29 VITALS — BP 143/73 | HR 74 | Resp 16 | Ht 66.0 in | Wt 210.2 lb

## 2017-09-29 DIAGNOSIS — F5101 Primary insomnia: Secondary | ICD-10-CM | POA: Diagnosis not present

## 2017-09-29 DIAGNOSIS — G8929 Other chronic pain: Secondary | ICD-10-CM

## 2017-09-29 DIAGNOSIS — Z9889 Other specified postprocedural states: Secondary | ICD-10-CM

## 2017-09-29 DIAGNOSIS — M5030 Other cervical disc degeneration, unspecified cervical region: Secondary | ICD-10-CM

## 2017-09-29 DIAGNOSIS — M19041 Primary osteoarthritis, right hand: Secondary | ICD-10-CM

## 2017-09-29 DIAGNOSIS — M533 Sacrococcygeal disorders, not elsewhere classified: Secondary | ICD-10-CM

## 2017-09-29 DIAGNOSIS — M797 Fibromyalgia: Secondary | ICD-10-CM | POA: Diagnosis not present

## 2017-09-29 DIAGNOSIS — M19042 Primary osteoarthritis, left hand: Secondary | ICD-10-CM

## 2017-09-29 DIAGNOSIS — D682 Hereditary deficiency of other clotting factors: Secondary | ICD-10-CM

## 2017-09-29 DIAGNOSIS — R5383 Other fatigue: Secondary | ICD-10-CM | POA: Diagnosis not present

## 2017-09-29 DIAGNOSIS — M5136 Other intervertebral disc degeneration, lumbar region: Secondary | ICD-10-CM

## 2017-09-29 DIAGNOSIS — Z8709 Personal history of other diseases of the respiratory system: Secondary | ICD-10-CM

## 2017-09-29 DIAGNOSIS — Z853 Personal history of malignant neoplasm of breast: Secondary | ICD-10-CM

## 2017-09-30 MED ORDER — NEXIUM 40 MG PO CPDR: 40.0000 mg | DELAYED_RELEASE_CAPSULE | Freq: Two times a day (BID) | ORAL | 1 refills | Status: DC

## 2017-09-30 NOTE — Telephone Encounter (Signed)
Patient states that she can not take the generic version of nexium and needs the nexium purple pill sent in to the pharmacy.

## 2017-09-30 NOTE — Telephone Encounter (Signed)
Resent for brand name Nexium and patient informed.

## 2017-09-30 NOTE — Addendum Note (Signed)
Addended by: [place], Larysa Pall E on: 09/30/2017 10:14 AM   Modules accepted: Orders

## 2017-10-01 ENCOUNTER — Encounter: Payer: 59 | Admitting: Speech Pathology

## 2017-10-01 ENCOUNTER — Other Ambulatory Visit: Payer: Self-pay

## 2017-10-01 DIAGNOSIS — R5383 Other fatigue: Secondary | ICD-10-CM

## 2017-10-01 LAB — PROTEIN ELECTROPHORESIS, SERUM, WITH REFLEX
ALBUMIN ELP: 4.1 g/dL (ref 3.8–4.8)
ALPHA 1: 0.3 g/dL (ref 0.2–0.3)
ALPHA 2: 0.8 g/dL (ref 0.5–0.9)
BETA GLOBULIN: 0.4 g/dL (ref 0.4–0.6)
Beta 2: 0.5 g/dL (ref 0.2–0.5)
Gamma Globulin: 1.1 g/dL (ref 0.8–1.7)
Total Protein: 7.2 g/dL (ref 6.1–8.1)

## 2017-10-01 LAB — CBC WITH DIFFERENTIAL/PLATELET
BASOS ABS: 49 {cells}/uL (ref 0–200)
Basophils Relative: 0.5 %
EOS PCT: 0.6 %
Eosinophils Absolute: 58 cells/uL (ref 15–500)
HEMATOCRIT: 42.5 % (ref 35.0–45.0)
HEMOGLOBIN: 14.4 g/dL (ref 11.7–15.5)
LYMPHS ABS: 2318 {cells}/uL (ref 850–3900)
MCH: 30.5 pg (ref 27.0–33.0)
MCHC: 33.9 g/dL (ref 32.0–36.0)
MCV: 90 fL (ref 80.0–100.0)
MPV: 10.7 fL (ref 7.5–12.5)
Monocytes Relative: 7.2 %
NEUTROS ABS: 6577 {cells}/uL (ref 1500–7800)
Neutrophils Relative %: 67.8 %
Platelets: 342 10*3/uL (ref 140–400)
RBC: 4.72 10*6/uL (ref 3.80–5.10)
RDW: 12.3 % (ref 11.0–15.0)
Total Lymphocyte: 23.9 %
WBC mixed population: 698 cells/uL (ref 200–950)
WBC: 9.7 10*3/uL (ref 3.8–10.8)

## 2017-10-01 LAB — COMPLETE METABOLIC PANEL WITH GFR
AG Ratio: 1.7 (calc) (ref 1.0–2.5)
ALBUMIN MSPROF: 4.5 g/dL (ref 3.6–5.1)
ALKALINE PHOSPHATASE (APISO): 49 U/L (ref 33–130)
ALT: 26 U/L (ref 6–29)
AST: 23 U/L (ref 10–35)
BUN: 11 mg/dL (ref 7–25)
CO2: 29 mmol/L (ref 20–32)
CREATININE: 0.91 mg/dL (ref 0.50–1.05)
Calcium: 9.2 mg/dL (ref 8.6–10.4)
Chloride: 104 mmol/L (ref 98–110)
GFR, EST AFRICAN AMERICAN: 81 mL/min/{1.73_m2} (ref >=60)
GFR, EST NON AFRICAN AMERICAN: 70 mL/min/{1.73_m2} (ref >=60)
GLOBULIN: 2.6 g/dL (ref 1.9–3.7)
Glucose, Bld: 86 mg/dL (ref 65–99)
Potassium: 4 mmol/L (ref 3.5–5.3)
SODIUM: 141 mmol/L (ref 135–146)
TOTAL PROTEIN: 7.1 g/dL (ref 6.1–8.1)
Total Bilirubin: 0.5 mg/dL (ref 0.2–1.2)

## 2017-10-01 LAB — URINALYSIS, ROUTINE W REFLEX MICROSCOPIC
BILIRUBIN URINE: NEGATIVE
GLUCOSE, UA: NEGATIVE
Hgb urine dipstick: NEGATIVE
KETONES UR: NEGATIVE
Leukocytes, UA: NEGATIVE
Nitrite: NEGATIVE
Protein, ur: NEGATIVE
SPECIFIC GRAVITY, URINE: 1.017 (ref 1.001–1.03)
pH: 7.5 (ref 5.0–8.0)

## 2017-10-01 LAB — ANA: Anti Nuclear Antibody(ANA): NEGATIVE

## 2017-10-01 LAB — VITAMIN D 25 HYDROXY (VIT D DEFICIENCY, FRACTURES): VIT D 25 HYDROXY: 45 ng/mL (ref 30–100)

## 2017-10-01 LAB — CYCLIC CITRUL PEPTIDE ANTIBODY, IGG: Cyclic Citrullin Peptide Ab: <16 UNITS

## 2017-10-01 LAB — SEDIMENTATION RATE: Sed Rate: 11 mm/h (ref 0–30)

## 2017-10-01 LAB — RHEUMATOID FACTOR: Rhuematoid fact SerPl-aCnc: <14 IU/mL (ref <14)

## 2017-10-01 LAB — TSH: TSH: 1.64 m[IU]/L (ref 0.40–4.50)

## 2017-10-01 LAB — CK: Total CK: 239 U/L — ABNORMAL HIGH (ref 29–143)

## 2017-10-01 NOTE — Progress Notes (Signed)
All her labs are normal.  CK was mildly elevated.  Which could be due to an injury or an intramuscular injection.  I would like to repeat CK and aldolase in 1 month.

## 2017-10-05 ENCOUNTER — Encounter: Payer: 59 | Admitting: Speech Pathology

## 2017-10-05 DIAGNOSIS — Z6834 Body mass index (BMI) 34.0-34.9, adult: Secondary | ICD-10-CM | POA: Diagnosis not present

## 2017-10-05 DIAGNOSIS — E6609 Other obesity due to excess calories: Secondary | ICD-10-CM | POA: Diagnosis not present

## 2017-10-08 ENCOUNTER — Encounter: Payer: 59 | Admitting: Speech Pathology

## 2017-10-12 ENCOUNTER — Encounter: Payer: 59 | Admitting: Speech Pathology

## 2017-10-13 DIAGNOSIS — Z881 Allergy status to other antibiotic agents status: Secondary | ICD-10-CM | POA: Diagnosis not present

## 2017-10-13 DIAGNOSIS — H268 Other specified cataract: Secondary | ICD-10-CM | POA: Diagnosis not present

## 2017-10-13 DIAGNOSIS — Z882 Allergy status to sulfonamides status: Secondary | ICD-10-CM | POA: Diagnosis not present

## 2017-10-14 DIAGNOSIS — J45998 Other asthma: Secondary | ICD-10-CM | POA: Diagnosis not present

## 2017-10-14 DIAGNOSIS — H25812 Combined forms of age-related cataract, left eye: Secondary | ICD-10-CM | POA: Diagnosis not present

## 2017-10-14 DIAGNOSIS — H25813 Combined forms of age-related cataract, bilateral: Secondary | ICD-10-CM | POA: Diagnosis not present

## 2017-10-15 ENCOUNTER — Encounter: Payer: 59 | Admitting: Speech Pathology

## 2017-10-18 HISTORY — PX: EYE SURGERY: SHX253

## 2017-10-20 DIAGNOSIS — R350 Frequency of micturition: Secondary | ICD-10-CM | POA: Diagnosis not present

## 2017-10-20 DIAGNOSIS — R3915 Urgency of urination: Secondary | ICD-10-CM | POA: Diagnosis not present

## 2017-10-20 DIAGNOSIS — N3281 Overactive bladder: Secondary | ICD-10-CM | POA: Diagnosis not present

## 2017-10-22 ENCOUNTER — Encounter: Payer: 59 | Admitting: Speech Pathology

## 2017-10-27 DIAGNOSIS — Z713 Dietary counseling and surveillance: Secondary | ICD-10-CM | POA: Diagnosis not present

## 2017-10-27 DIAGNOSIS — E6609 Other obesity due to excess calories: Secondary | ICD-10-CM | POA: Diagnosis not present

## 2017-10-27 DIAGNOSIS — Z6834 Body mass index (BMI) 34.0-34.9, adult: Secondary | ICD-10-CM | POA: Diagnosis not present

## 2017-11-04 DIAGNOSIS — H25813 Combined forms of age-related cataract, bilateral: Secondary | ICD-10-CM | POA: Diagnosis not present

## 2017-11-04 DIAGNOSIS — H25812 Combined forms of age-related cataract, left eye: Secondary | ICD-10-CM | POA: Diagnosis not present

## 2017-11-04 DIAGNOSIS — J45998 Other asthma: Secondary | ICD-10-CM | POA: Diagnosis not present

## 2017-11-19 DIAGNOSIS — D1801 Hemangioma of skin and subcutaneous tissue: Secondary | ICD-10-CM | POA: Diagnosis not present

## 2017-11-19 DIAGNOSIS — L570 Actinic keratosis: Secondary | ICD-10-CM | POA: Diagnosis not present

## 2017-11-19 DIAGNOSIS — D692 Other nonthrombocytopenic purpura: Secondary | ICD-10-CM | POA: Diagnosis not present

## 2017-11-19 DIAGNOSIS — D225 Melanocytic nevi of trunk: Secondary | ICD-10-CM | POA: Diagnosis not present

## 2017-11-19 DIAGNOSIS — L820 Inflamed seborrheic keratosis: Secondary | ICD-10-CM | POA: Diagnosis not present

## 2017-11-27 ENCOUNTER — Telehealth: Payer: Self-pay | Admitting: Physician Assistant

## 2017-11-27 ENCOUNTER — Other Ambulatory Visit: Payer: Self-pay

## 2017-11-27 ENCOUNTER — Telehealth: Payer: Self-pay | Admitting: Rheumatology

## 2017-11-27 DIAGNOSIS — R5383 Other fatigue: Secondary | ICD-10-CM | POA: Diagnosis not present

## 2017-11-27 DIAGNOSIS — T148XXA Other injury of unspecified body region, initial encounter: Secondary | ICD-10-CM | POA: Diagnosis not present

## 2017-11-27 NOTE — Telephone Encounter (Signed)
Patient presented today for lab work-repeat CK and aldolase. She is concerned about a rash on her forearms.  She appears to have clusters of petechiae on the posterior aspect of both forearms.  Non-blanchable.  No itching or irritation.  No new medications.  She reports she does not take Aleve.  She is on Aspirin 81 mg by mouth daily. She already had labs drawn by the time she mentioned the rash when leaving. She did not want to get additional labs drawn. I advised her to follow up with dermatology.  I advised her to take pictures of the petechiae as well.  She was advised to call our office Monday if she experiences any new or worsening symptoms.   09/29/17 labs: RF-, CCP-, ANA-, Sed rate 11, TSH WNL  CK 239 on 09/29/17-repeated today.

## 2017-11-27 NOTE — Telephone Encounter (Signed)
Ok to provide referral.

## 2017-11-27 NOTE — Telephone Encounter (Signed)
Patient advised we are giving referral to [facility] and the referral is ready for pick up.

## 2017-11-27 NOTE — Telephone Encounter (Signed)
Patient would like to get a referral to [facility] for massage therapy. Please call patient with any questions.

## 2017-11-30 LAB — ALDOLASE: Aldolase: 5.6 U/L (ref <=8.1)

## 2017-11-30 LAB — CK: Total CK: 340 U/L — ABNORMAL HIGH (ref 29–143)

## 2017-12-01 ENCOUNTER — Telehealth: Payer: Self-pay | Admitting: Rheumatology

## 2017-12-01 DIAGNOSIS — R748 Abnormal levels of other serum enzymes: Secondary | ICD-10-CM

## 2017-12-01 NOTE — Telephone Encounter (Signed)
Patient advised CK elevated-340. Aldolase WNL. Referral palced for EMG and NCV through [facility]. Patient will come for additional lab work including pANCA, sed rate, and myositis panel on 12/02/17.

## 2017-12-01 NOTE — Telephone Encounter (Signed)
Patient left a voicemail stating she was returning your call.   

## 2017-12-01 NOTE — Telephone Encounter (Signed)
-----   Message from Ofilia Neas, PA-C sent at 12/01/2017  2:10 PM EDT ----- CK elevated-340. Aldolase WNL.  Discussed with Dr. Estanislado Pandy, and she would like the patient to return for additional lab work including pANCA, sed rate, and myositis panel.  Please place referral for EMG and NCV through [facility].  Please place neurology referral as well.

## 2017-12-02 ENCOUNTER — Telehealth: Payer: Self-pay | Admitting: Rheumatology

## 2017-12-02 ENCOUNTER — Other Ambulatory Visit: Payer: Self-pay

## 2017-12-02 DIAGNOSIS — R748 Abnormal levels of other serum enzymes: Secondary | ICD-10-CM

## 2017-12-02 NOTE — Telephone Encounter (Signed)
Patient left a voicemail stating she is scheduled to see the neurologist on 12/30/17 and is checking to see if she can continue with massage therapy until that appointment.

## 2017-12-03 NOTE — Telephone Encounter (Signed)
Attempted to contact the patient and left message for patient to call the office.  

## 2017-12-03 NOTE — Telephone Encounter (Signed)
Patient advised Dr. Estanislado Pandy would like the patient to hold off on massage at this time. Patient wants to know what she may be able to do for pain relief since she will be unable to do the massage at this time. Please advise.

## 2017-12-03 NOTE — Telephone Encounter (Signed)
She can take tylenol or OTC NSAIDs PRN for pain relief. She can also use voltaren gel TID PRN.

## 2017-12-03 NOTE — Telephone Encounter (Signed)
Dr. Estanislado Pandy would like the patient to hold off on massage at this time.

## 2017-12-03 NOTE — Telephone Encounter (Signed)
Patient advised she can take tylenol or OTC NSAIDs PRN for pain relief. She can also use voltaren gel TID PRN. Patient asked if she can get SI joint injection. Per Hazel Sams, PA-C. Scheduled for 12/09/17 at 8:40 am.

## 2017-12-07 NOTE — Progress Notes (Signed)
Office Visit Note  Patient: Tracey Rodriguez             Date of Birth: 05/10/1960           MRN: [id number]             PCP: Aura Dials, MD Referring: Aura Dials, MD Visit Date: 12/09/2017 Occupation: @GUAROCC @  Subjective:  Bilateral SI joint pain   History of Present Illness: Tracey Rodriguez is a 57 y.o. female with history of fibromyalgia, DDD, and osteoarthritis.  She has generalized muscle aches and muscle tenderness.  She reports she is also been noticing increased muscle weakness.  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  She states she has some difficulty getting up from a chair as well as raising her arms above her head which she reports is been going on for years.  She denies being on any statin therapy.  She denies any recent falls, injuries, or IM injections.  Reports that she would like to go to endocrine therapy for massage if she came.  She states she has an appointment with the neurologist on 12/31/2017.  She continues to have bilateral SI joint pain and left trochanteric bursitis.  She denies any joint swelling at this time.  She states that the petechiae on her forearms have resolved.  She was advised to stop aspirin 81 mg by her dermatologist.  She states that she has noticed increased facial hair and acne.  She states she has not follow-up with her gynecologist recently.    Activities of Daily Living:  Patient reports morning stiffness for 30-40  minutes.   Patient Reports nocturnal pain.  Difficulty dressing/grooming: Denies Difficulty climbing stairs: Reports Difficulty getting out of chair: Reports Difficulty using hands for taps, buttons, cutlery, and/or writing: Denies  Review of Systems  Constitutional: Positive for fatigue.  HENT: Negative for mouth sores, mouth dryness and nose dryness.   Eyes: Positive for dryness. Negative for pain and visual disturbance.  Respiratory: Negative for cough, hemoptysis, shortness of breath  and difficulty breathing.   Cardiovascular: Positive for hypertension. Negative for chest pain, palpitations and swelling in legs/feet.  Gastrointestinal: Positive for constipation. Negative for blood in stool and diarrhea.  Endocrine: Negative for increased urination.  Genitourinary: Negative for painful urination.  Musculoskeletal: Positive for arthralgias, joint pain, myalgias, muscle weakness, morning stiffness, muscle tenderness and myalgias. Negative for joint swelling.  Skin: Negative for color change, pallor, rash, hair loss, nodules/bumps, skin tightness, ulcers and sensitivity to sunlight.  Allergic/Immunologic: Negative for susceptible to infections.  Neurological: Negative for dizziness, numbness, headaches and weakness.  Hematological: Negative for swollen glands.  Psychiatric/Behavioral: Negative for depressed mood and sleep disturbance. The patient is not nervous/anxious.     PMFS History:  Patient Active Problem List   Diagnosis Date Noted  . Hoarseness 07/30/2017  . Gastroesophageal reflux disease 07/30/2017  . History of breast cancer 09/11/2016  . Other fatigue 03/15/2016  . Primary insomnia 03/15/2016  . Primary osteoarthritis of both hands 03/15/2016  . DDD cervical spine 03/15/2016  . 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 History of bilateral carpal tunnel release 03/15/2016  . Unspecified hypothyroidism 03/30/2013  . Fibromyalgia 03/30/2013  . Breast cancer ([place]) 03/30/2013  . 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 Dyspnea 02/02/2013  . COPD (chronic obstructive pulmonary disease) ([place]) 02/02/2013  . Restrictive lung disease 02/02/2013    Past Medical History:  Diagnosis Date  . Arthritis    deg  disc disease  - lower back, neck  . Cancer ([place])    left breast surgery-lumpectomy  . Disorder of vocal cord   . Factor V deficiency ([place])   . Fibromyalgia   . GERD (gastroesophageal reflux disease)   . Heart murmur   . Hyperlipidemia   .  Hypothyroidism    goiter  . Neuromuscular disorder ([place])    sciatic nerve  . PONV (postoperative nausea and vomiting)   . SVD (spontaneous vaginal delivery)    x 2    Family History  Problem Relation Age of Onset  . Heart disease Father 15  . Heart attack Father   . Clotting disorder Father   . Rheumatologic disease Father   . Crohn's disease Father   . Heart disease Maternal Grandfather   . Lung cancer Maternal Grandfather   . Breast cancer Mother 75  . Ovarian cancer Mother   . Liver cancer Maternal Grandmother   . Heart attack Brother   . Cancer Brother   . Lung cancer Brother   . Colon polyps Brother   . Colon polyps Brother   . Breast cancer Maternal Aunt 60  . Kidney disease Paternal Uncle   . Irritable bowel syndrome Son    Past Surgical History:  Procedure Laterality Date  . ABLATION    . APPENDECTOMY    . BREAST EXCISIONAL BIOPSY Left   . BREAST SURGERY     left- lumpectomy  . DILATATION & CURRETTAGE/HYSTEROSCOPY WITH RESECTOCOPE N/A 04/05/2012   Procedure: hysteroscopy with endocervical curretting;  Surgeon: Allyn Kenner, DO;  Location: [facility];  Service: Gynecology;  Laterality: N/A;  . DILATION AND CURETTAGE OF UTERUS    . EYE SURGERY Bilateral 10/2017   cataract extraction   . NOSE SURGERY     Social History   Social History Narrative  . Not on file    Objective: Vital Signs: BP (!) 155/93 (BP Location: Left Arm, Patient Position: Sitting, Cuff Size: Normal)   Pulse 76   Resp 14   Ht 5\' 6"  (1.676 m)   Wt 218 lb 3.2 oz (99 kg)   BMI 35.22 kg/m    Physical Exam  Constitutional: She is oriented to person, place, and time. She appears well-developed and well-nourished.  HENT:  Head: Normocephalic and atraumatic.  Eyes: Conjunctivae and EOM are normal.  Neck: Normal range of motion.  Cardiovascular: Normal rate, regular rhythm, normal heart sounds and intact distal pulses.  Pulmonary/Chest: Effort normal and breath sounds normal.  Abdominal:  Soft. Bowel sounds are normal.  Lymphadenopathy:    She has no cervical adenopathy.  Neurological: She is alert and oriented to person, place, and time.  Skin: Skin is warm and dry. Capillary refill takes less than 2 seconds.  Psychiatric: She has a normal mood and affect. Her behavior is normal.  Nursing note and vitals reviewed.    Musculoskeletal Exam: Generalized hyperalgesia. C-spine, thoracic spine, and lumbar spine good ROM.  No midline spinal tenderness.  Bilateral SI joint tenderness.  Shoulder joints, elbow joints, wrist joints, MCPs, PIPs, and DIPs good ROM with no synovitis.  DIP synovial thickening. Hip joints, knee joints, ankle joints, MCPs, PIPs, and DIPs good ROM with no synovitis.  No warmth or effusion of knee joints.  No tenderness or swelling of ankle joints.  No achilles tendonitis or plantar fasciitis.    CDAI Exam: CDAI Score: Not documented Patient Global Assessment: Not documented; Provider Global Assessment: Not documented Swollen: Not documented; Tender: Not documented Joint  Exam   Not documented   There is currently no information documented on the homunculus. Go to the Rheumatology activity and complete the homunculus joint exam.  Investigation: No additional findings.  Imaging: No results found.  Recent Labs: Lab Results  Component Value Date   WBC 9.7 09/29/2017   HGB 14.4 09/29/2017   PLT 342 09/29/2017   NA 141 09/29/2017   K 4.0 09/29/2017   CL 104 09/29/2017   CO2 29 09/29/2017   GLUCOSE 86 09/29/2017   BUN 11 09/29/2017   CREATININE 0.91 09/29/2017   BILITOT 0.5 09/29/2017   ALKPHOS 72 03/27/2013   AST 23 09/29/2017   ALT 26 09/29/2017   PROT 7.1 09/29/2017   PROT 7.2 09/29/2017   ALBUMIN 4.4 03/27/2013   CALCIUM 9.2 09/29/2017   GFRAA 81 09/29/2017    Speciality Comments: No specialty comments available.  Procedures:  No procedures performed Allergies: Capsaicin; Etodolac; Bee venom; Doxycycline; Pepto-bismol [bismuth];  Pork-derived products; and Sulfonamide derivatives   Assessment / Plan:     Visit Diagnoses: Fibromyalgia: She has generalized hyperalgesia on exam.  She continues to have generalized muscle aches muscle tenderness.  She is also noticed increased muscle weakness.  She has full strength on exam today.  She has chronic fatigue and insomnia.  She was given a referral to integrative therapies which she plans on scheduling appointment for massage.  He was advised to avoid dry needling prior to the appointment with the neurologist on 12/31/17. She is also given a prescription for Robaxin 500 mg 1 tablet twice daily as needed for muscle spasms.  Other fatigue: Chronic.  Primary insomnia: Good sleep hygiene was discussed.   Primary osteoarthritis of both hands: DIP synovial thickening consistent with osteoarthritis of bilateral hands.  No synovitis noted.  She has complete fist formation bilaterally.  Joint protection and muscle strengthening were discussed.  DDD (degenerative disc disease), cervical: Good ROM with no discomfort.  No symptoms of radiculopathy at this time.  She has trapezius muscle tension and muscle tenderness.   DDD (degenerative disc disease), lumbar: Chronic pain.  No midline spinal tenderness.   Chronic SI joint pain: She has bilateral SI joint tenderness on exam today.  We opted out of performing a cortisone injection due to elevated blood pressure as well as an upcoming neurology visit.  We do not want the cortisone to mask the cause of the elevated CK.   Elevated CK -CK was 239 on 09/29/17.  Repeat CK was 340 on 11/27/17.  Aldolase is WNL-5.6. Sed rate 14. Myositis panel is pending. She continues to have generalized muscle aches and muscle tenderness. She reports muscle weakness, difficulty getting up from a chair, and raising arms above her head for years.  She has full muscle strength of upper and lower extremities on exam today.  She will return in 2 weeks for repeat CK.  We will  call her with the results of the myositis panel.  She has an appointment with neurology for further evaluation and EMG and NCV at [facility].  She plans on going for a massage, but she was advised to avoid dry needling.  She was given a prescription for Robaxin 500 mg 1 tablet by mouth BID PRN for muscle spasms.  Plan: CK   History of bilateral carpal tunnel release: She is asymptomatic at this time.   Other medical conditions are listed as follows:   Factor V deficiency ([place])  History of COPD  History of breast cancer  Orders: Orders Placed This Encounter  Procedures  . CK   Meds ordered this encounter  Medications  . methocarbamol (ROBAXIN) 500 MG tablet    Sig: Take 1 tablet (500 mg total) by mouth 2 (two) times daily as needed for muscle spasms.    Dispense:  30 tablet    Refill:  0    Face-to-face time spent with patient was 30 minutes. Greater than 50% of time was spent in counseling and coordination of care.  Follow-Up Instructions: Return in about 4 weeks (around 01/06/2018) for Fibromyalgia, Osteoarthritis.   Ofilia Neas, PA-C   I examined and evaluated the patient with Hazel Sams PA.  Patient has been experiencing muscle pain and fatigue.  Her CK has been elevated.  Although her aldolase was normal.  We will repeat her CK in 1 month.  She has appointment with neurology coming up for EMG nerve conduction and evaluation.  Myositis panel is pending at this time.  The plan of care was discussed as noted above.  Bo Merino, MD  Note - This record has been created using Editor, commissioning.  Chart creation errors have been sought, but may not always  have been located. Such creation errors do not reflect on  the standard of medical care.

## 2017-12-09 ENCOUNTER — Ambulatory Visit (INDEPENDENT_AMBULATORY_CARE_PROVIDER_SITE_OTHER): Payer: 59 | Admitting: Rheumatology

## 2017-12-09 ENCOUNTER — Encounter: Payer: Self-pay | Admitting: Physician Assistant

## 2017-12-09 VITALS — BP 155/93 | HR 76 | Resp 14 | Ht 66.0 in | Wt 218.2 lb

## 2017-12-09 DIAGNOSIS — Z8709 Personal history of other diseases of the respiratory system: Secondary | ICD-10-CM

## 2017-12-09 DIAGNOSIS — D682 Hereditary deficiency of other clotting factors: Secondary | ICD-10-CM

## 2017-12-09 DIAGNOSIS — R5383 Other fatigue: Secondary | ICD-10-CM

## 2017-12-09 DIAGNOSIS — M5030 Other cervical disc degeneration, unspecified cervical region: Secondary | ICD-10-CM | POA: Diagnosis not present

## 2017-12-09 DIAGNOSIS — M797 Fibromyalgia: Secondary | ICD-10-CM

## 2017-12-09 DIAGNOSIS — Z9889 Other specified postprocedural states: Secondary | ICD-10-CM

## 2017-12-09 DIAGNOSIS — M5136 Other intervertebral disc degeneration, lumbar region: Secondary | ICD-10-CM | POA: Diagnosis not present

## 2017-12-09 DIAGNOSIS — G8929 Other chronic pain: Secondary | ICD-10-CM

## 2017-12-09 DIAGNOSIS — M19042 Primary osteoarthritis, left hand: Secondary | ICD-10-CM

## 2017-12-09 DIAGNOSIS — M19041 Primary osteoarthritis, right hand: Secondary | ICD-10-CM

## 2017-12-09 DIAGNOSIS — Z853 Personal history of malignant neoplasm of breast: Secondary | ICD-10-CM

## 2017-12-09 DIAGNOSIS — F5101 Primary insomnia: Secondary | ICD-10-CM

## 2017-12-09 DIAGNOSIS — M533 Sacrococcygeal disorders, not elsewhere classified: Secondary | ICD-10-CM

## 2017-12-09 DIAGNOSIS — R748 Abnormal levels of other serum enzymes: Secondary | ICD-10-CM

## 2017-12-09 MED ORDER — METHOCARBAMOL 500 MG PO TABS: 500.0000 mg | ORAL_TABLET | Freq: Two times a day (BID) | ORAL | 0 refills | Status: DC | PRN

## 2017-12-11 DIAGNOSIS — N951 Menopausal and female climacteric states: Secondary | ICD-10-CM | POA: Diagnosis not present

## 2017-12-11 LAB — SEDIMENTATION RATE: Sed Rate: 14 mm/h (ref 0–30)

## 2017-12-11 LAB — MYOSITIS ASSESSR PLUS JO-1 AUTOABS
EJ Autoabs: NOT DETECTED
Jo-1 Autoabs: <1 AI
Ku Autoabs: NOT DETECTED
Mi-2 Autoabs: NOT DETECTED
OJ AUTOABS: NOT DETECTED
PL-12 Autoabs: NOT DETECTED
PL-7 Autoabs: NOT DETECTED
SRP AUTOABS: NOT DETECTED

## 2017-12-11 LAB — PAN-ANCA
ANCA SCREEN: NEGATIVE
Myeloperoxidase Abs: <1 AI
Serine Protease 3: <1 AI

## 2017-12-14 DIAGNOSIS — M5136 Other intervertebral disc degeneration, lumbar region: Secondary | ICD-10-CM | POA: Diagnosis not present

## 2017-12-14 DIAGNOSIS — M797 Fibromyalgia: Secondary | ICD-10-CM | POA: Diagnosis not present

## 2017-12-14 DIAGNOSIS — M5030 Other cervical disc degeneration, unspecified cervical region: Secondary | ICD-10-CM | POA: Diagnosis not present

## 2017-12-14 NOTE — Progress Notes (Signed)
Myositis panel negative. Pan-ANCA negative. Sed rate WNL.

## 2017-12-16 DIAGNOSIS — M5136 Other intervertebral disc degeneration, lumbar region: Secondary | ICD-10-CM | POA: Diagnosis not present

## 2017-12-16 DIAGNOSIS — M5030 Other cervical disc degeneration, unspecified cervical region: Secondary | ICD-10-CM | POA: Diagnosis not present

## 2017-12-16 DIAGNOSIS — M797 Fibromyalgia: Secondary | ICD-10-CM | POA: Diagnosis not present

## 2017-12-17 DIAGNOSIS — M5030 Other cervical disc degeneration, unspecified cervical region: Secondary | ICD-10-CM | POA: Diagnosis not present

## 2017-12-17 DIAGNOSIS — M5136 Other intervertebral disc degeneration, lumbar region: Secondary | ICD-10-CM | POA: Diagnosis not present

## 2017-12-17 DIAGNOSIS — M797 Fibromyalgia: Secondary | ICD-10-CM | POA: Diagnosis not present

## 2017-12-22 DIAGNOSIS — E039 Hypothyroidism, unspecified: Secondary | ICD-10-CM | POA: Diagnosis not present

## 2017-12-22 DIAGNOSIS — Z0000 Encounter for general adult medical examination without abnormal findings: Secondary | ICD-10-CM | POA: Diagnosis not present

## 2017-12-23 ENCOUNTER — Other Ambulatory Visit: Payer: Self-pay

## 2017-12-23 DIAGNOSIS — R748 Abnormal levels of other serum enzymes: Secondary | ICD-10-CM | POA: Diagnosis not present

## 2017-12-23 NOTE — Progress Notes (Deleted)
Office Visit Note  Patient: Tracey Rodriguez             Date of Birth: November 27, 1960           MRN: [id number]             PCP: Aura Dials, MD Referring: Aura Dials, MD Visit Date: 01/06/2018 Occupation: @GUAROCC @  Subjective:  No chief complaint on file.   History of Present Illness: [name] is a 57 y.o. female ***   Activities of Daily Living:  Patient reports morning stiffness for *** {minute/hour:19697}.   Patient {ACTIONS;DENIES/REPORTS:21021675::"Denies"} nocturnal pain.  Difficulty dressing/grooming: {ACTIONS;DENIES/REPORTS:21021675::"Denies"} Difficulty climbing stairs: {ACTIONS;DENIES/REPORTS:21021675::"Denies"} Difficulty getting out of chair: {ACTIONS;DENIES/REPORTS:21021675::"Denies"} Difficulty using hands for taps, buttons, cutlery, and/or writing: {ACTIONS;DENIES/REPORTS:21021675::"Denies"}  No Rheumatology ROS completed.   PMFS History:  Patient Active Problem List   Diagnosis Date Noted  . Hoarseness 07/30/2017  . Gastroesophageal reflux disease 07/30/2017  . History of breast cancer 09/11/2016  . Other fatigue 03/15/2016  . Primary insomnia 03/15/2016  . Primary osteoarthritis of both hands 03/15/2016  . DDD cervical spine 03/15/2016  . 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 History of bilateral carpal tunnel release 03/15/2016  . Unspecified hypothyroidism 03/30/2013  . Fibromyalgia 03/30/2013  . Breast cancer ([place]) 03/30/2013  . 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 Dyspnea 02/02/2013  . COPD (chronic obstructive pulmonary disease) ([place]) 02/02/2013  . Restrictive lung disease 02/02/2013    Past Medical History:  Diagnosis Date  . Arthritis    deg disc disease  - lower back, neck  . Cancer ([employer])    left breast surgery-lumpectomy  . Disorder of vocal cord   . Factor V deficiency ([place])   . Fibromyalgia   . GERD (gastroesophageal reflux disease)   . Heart murmur   . Hyperlipidemia   . Hypothyroidism    goiter  .  Neuromuscular disorder ([place])    sciatic nerve  . PONV (postoperative nausea and vomiting)   . SVD (spontaneous vaginal delivery)    x 2    Family History  Problem Relation Age of Onset  . Heart disease Father 74  . Heart attack Father   . Clotting disorder Father   . Rheumatologic disease Father   . Crohn's disease Father   . Heart disease Maternal Grandfather   . Lung cancer Maternal Grandfather   . Breast cancer Mother 25  . Ovarian cancer Mother   . Liver cancer Maternal Grandmother   . Heart attack Brother   . Cancer Brother   . Lung cancer Brother   . Colon polyps Brother   . Colon polyps Brother   . Breast cancer Maternal Aunt 60  . Kidney disease Paternal Uncle   . Irritable bowel syndrome Son    Past Surgical History:  Procedure Laterality Date  . ABLATION    . APPENDECTOMY    . BREAST EXCISIONAL BIOPSY Left   . BREAST SURGERY     left- lumpectomy  . DILATATION & CURRETTAGE/HYSTEROSCOPY WITH RESECTOCOPE N/A 04/05/2012   Procedure: hysteroscopy with endocervical curretting;  Surgeon: Allyn Kenner, DO;  Location: [facility];  Service: Gynecology;  Laterality: N/A;  . DILATION AND CURETTAGE OF UTERUS    . EYE SURGERY Bilateral 10/2017   cataract extraction   . NOSE SURGERY     Social History   Social History Narrative  . Not on file    Objective: Vital Signs: There were no vitals taken for this visit.   Physical Exam  Musculoskeletal Exam: ***  CDAI Exam: CDAI Score: Not documented Patient Global Assessment: Not documented; Provider Global Assessment: Not documented Swollen: Not documented; Tender: Not documented Joint Exam   Not documented   There is currently no information documented on the homunculus. Go to the Rheumatology activity and complete the homunculus joint exam.  Investigation: No additional findings.  Imaging: No results found.  Recent Labs: Lab Results  Component Value Date   WBC 9.7 09/29/2017   HGB 14.4 09/29/2017   PLT  342 09/29/2017   NA 141 09/29/2017   K 4.0 09/29/2017   CL 104 09/29/2017   CO2 29 09/29/2017   GLUCOSE 86 09/29/2017   BUN 11 09/29/2017   CREATININE 0.91 09/29/2017   BILITOT 0.5 09/29/2017   ALKPHOS 72 03/27/2013   AST 23 09/29/2017   ALT 26 09/29/2017   PROT 7.1 09/29/2017   PROT 7.2 09/29/2017   ALBUMIN 4.4 03/27/2013   CALCIUM 9.2 09/29/2017   GFRAA 81 09/29/2017    Speciality Comments: No specialty comments available.  Procedures:  No procedures performed Allergies: Capsaicin; Etodolac; Bee venom; Doxycycline; Pepto-bismol [bismuth]; Pork-derived products; and Sulfonamide derivatives   Assessment / Plan:     Visit Diagnoses: Fibromyalgia - Robaxin 500 mg 1 tablet by mouth BID PRN  Other fatigue  Primary insomnia  Primary osteoarthritis of both hands  DDD (degenerative disc disease), cervical  DDD (degenerative disc disease), lumbar  Elevated CK - CK was 239 on 09/29/17.  Repeat CK was 340 on 11/27/17.  Aldolase is WNL-5.6. Sed rate 14. Myositis panel is pending  Chronic SI joint pain  History of bilateral carpal tunnel release  Factor V deficiency ([facility])  History of COPD  History of breast cancer   Orders: No orders of the defined types were placed in this encounter.  No orders of the defined types were placed in this encounter.   Face-to-face time spent with patient was *** minutes. Greater than 50% of time was spent in counseling and coordination of care.  Follow-Up Instructions: No follow-ups on file.   Ofilia Neas, PA-C  Note - This record has been created using Dragon software.  Chart creation errors have been sought, but may not always  have been located. Such creation errors do not reflect on  the standard of medical care.

## 2017-12-24 LAB — CK: Total CK: 89 U/L (ref 29–143)

## 2017-12-24 NOTE — Progress Notes (Signed)
CK is WNL.

## 2017-12-25 ENCOUNTER — Telehealth: Payer: Self-pay

## 2017-12-25 MED ORDER — NEXIUM 40 MG PO CPDR: 40.0000 mg | DELAYED_RELEASE_CAPSULE | Freq: Two times a day (BID) | ORAL | 1 refills | Status: DC

## 2017-12-25 NOTE — Telephone Encounter (Signed)
I called [facility], [phone] to do a prior authorization for patient's brand name Nexium and spoke with Tiffany and she said no prior authorization needed patient just has to get from [facility] not local pharmacy. I called the [facility] and told them this and told them I resent it to [place]. She is going to touch base with the patient and if she is totally out she will call [facility] and get them to do an over ride for a month's worth.  Patient's ID # is [id number].

## 2017-12-30 ENCOUNTER — Encounter: Payer: Self-pay | Admitting: Neurology

## 2017-12-30 ENCOUNTER — Ambulatory Visit (INDEPENDENT_AMBULATORY_CARE_PROVIDER_SITE_OTHER): Payer: 59 | Admitting: Neurology

## 2017-12-30 DIAGNOSIS — R252 Cramp and spasm: Secondary | ICD-10-CM | POA: Diagnosis not present

## 2017-12-30 DIAGNOSIS — R748 Abnormal levels of other serum enzymes: Secondary | ICD-10-CM

## 2017-12-30 NOTE — Procedures (Signed)
     HISTORY:  Tracey Rodriguez is a 57 year old patient with a history of fibromyalgia and frequent leg and foot cramps who has been noted to have a modest elevation in CK enzyme level, the most recent check was normal.  The patient reports that she feels weak in the arms and legs, she is being evaluated for this issue.  NERVE CONDUCTION STUDIES:  Nerve conduction studies were performed on both lower extremities. The distal motor latencies and motor amplitudes for the peroneal and posterior tibial nerves were within normal limits. The nerve conduction velocities for these nerves were also normal. The sensory latencies for the peroneal and sural nerves were within normal limits. The F wave latencies for the posterior tibial nerves were within normal limits.   EMG STUDIES:  EMG study was performed on the right lower extremity:  The tibialis anterior muscle reveals 2 to 4K motor units with full recruitment. No fibrillations or positive waves were seen. The peroneus tertius muscle reveals 2 to 4K motor units with full recruitment. No fibrillations or positive waves were seen. The medial gastrocnemius muscle reveals 1 to 3K motor units with full recruitment. No fibrillations or positive waves were seen. The vastus lateralis muscle reveals 2 to 4K motor units with full recruitment. No fibrillations or positive waves were seen. The iliopsoas muscle reveals 2 to 4K motor units with full recruitment. No fibrillations or positive waves were seen. The biceps femoris muscle (long head) reveals 2 to 4K motor units with full recruitment. No fibrillations or positive waves were seen. The lumbosacral paraspinal muscles were tested at 3 levels, and revealed no abnormalities of insertional activity at all 3 levels tested. There was good relaxation.   IMPRESSION:  Nerve conduction studies done on both lower extremities were within normal limits.  No evidence of a neuropathy was seen.  EMG evaluation of the  right lower extremity was unremarkable, no evidence of a myopathic disorder or evidence of a lumbosacral radiculopathy was seen.  Jill Alexanders MD 12/30/2017 1:47 PM  [facility] [address] [place], [place] [address]  Phone [phone] Fax [phone]

## 2017-12-30 NOTE — Progress Notes (Signed)
[place]    Nerve / Sites Muscle Latency Ref. Amplitude Ref. Rel Amp Segments Distance Velocity Ref. Area    ms ms mV mV %  cm m/s m/s mVms  L Peroneal - EDB     Ankle EDB 6.4 ?6.5 4.4 ?2.0 100 Ankle - EDB 9   15.4     Fib head EDB 13.5  3.5  79.5 Fib head - Ankle 31 44 ?44 14.8     Pop fossa EDB 15.8  3.3  96.2 Pop fossa - Fib head 10 44 ?44 11.8         Pop fossa - Ankle      R Peroneal - EDB     Ankle EDB 6.4 ?6.5 3.7 ?2.0 100 Ankle - EDB 9   12.3     Fib head EDB 13.8  3.6  97.2 Fib head - Ankle 32 44 ?44 12.6     Pop fossa EDB 15.8  3.4  94 Pop fossa - Fib head 10 48 ?44 12.0         Pop fossa - Ankle      L Tibial - AH     Ankle AH 4.5 ?5.8 12.9 ?4.0 100 Ankle - AH 9   40.1     Pop fossa AH 13.3  12.3  95.9 Pop fossa - Ankle 36 41 ?41 44.4  R Tibial - AH     Ankle AH 4.8 ?5.8 9.9 ?4.0 100 Ankle - AH 9   32.9     Pop fossa AH 13.6  8.9  90.6 Pop fossa - Ankle 36 41 ?41 32.1             SNC    Nerve / Sites Rec. Site Peak Lat Ref.  Amp Ref. Segments Distance    ms ms V V  cm  L Sural - Ankle (Calf)     Calf Ankle 4.4 ?4.4 15 ?6 Calf - Ankle 14  R Sural - Ankle (Calf)     Calf Ankle 4.2 ?4.4 8 ?6 Calf - Ankle 14  L Superficial peroneal - Ankle     Lat leg Ankle 4.4 ?4.4 9 ?6 Lat leg - Ankle 14  R Superficial peroneal - Ankle     Lat leg Ankle 4.1 ?4.4 6 ?6 Lat leg - Ankle 14              F  Wave    Nerve F Lat Ref.   ms ms  L Tibial - AH 51.4 ?56.0  R Tibial - AH 51.8 ?56.0

## 2017-12-30 NOTE — Progress Notes (Signed)
Please refer to EMG and nerve conduction procedure note.  

## 2017-12-31 DIAGNOSIS — M5030 Other cervical disc degeneration, unspecified cervical region: Secondary | ICD-10-CM | POA: Diagnosis not present

## 2017-12-31 DIAGNOSIS — M797 Fibromyalgia: Secondary | ICD-10-CM | POA: Diagnosis not present

## 2017-12-31 DIAGNOSIS — M5136 Other intervertebral disc degeneration, lumbar region: Secondary | ICD-10-CM | POA: Diagnosis not present

## 2018-01-04 ENCOUNTER — Telehealth: Payer: Self-pay

## 2018-01-04 NOTE — Telephone Encounter (Signed)
Attempted to contact patient and left message on machine to advise patient to call the office.  

## 2018-01-04 NOTE — Telephone Encounter (Signed)
Patient advised of results and verbalized understanding. Patient states she has an appointment with Dr. Estanislado Pandy on 01/06/18. Patient asked if she needs to keep her appointment for 01/06/18. Per Dr. Estanislado Pandy patient may cancel the appointment and come in for her 6 month check up or when if the muscle pain worsens. Patient verbalized understanding. Patient states she had an issue will the blood pulling at the top of her arm after holding her grandchild this week. Patient is not on an aspirin supplement and wants to know if the previously elevated CK could have anything to do with this.

## 2018-01-04 NOTE — Telephone Encounter (Signed)
Patient advised it is not related to the CK.  Her CBC was normal in August.  If she has persistent symptoms she should see her PCP. Patient verbalized understanding.

## 2018-01-04 NOTE — Telephone Encounter (Signed)
It is not related to the CK.  Her CBC was normal in August.  If she has persistent symptoms she should see her PCP.

## 2018-01-04 NOTE — Telephone Encounter (Signed)
-----   Message from Carole Binning, LPN sent at [id number]  3:10 PM EST -----   ----- Message ----- From: Bo Merino, MD Sent: 12/30/2017   4:48 PM EST To: Carole Binning, LPN  Please notify patient that nerve conduction velocity and EMG was normal. SD ----- Message ----- From: Kathrynn Ducking, MD Sent: 12/30/2017   1:51 PM EST To: Bo Merino, MD

## 2018-01-05 ENCOUNTER — Ambulatory Visit (INDEPENDENT_AMBULATORY_CARE_PROVIDER_SITE_OTHER): Payer: 59 | Admitting: Gastroenterology

## 2018-01-05 ENCOUNTER — Encounter: Payer: Self-pay | Admitting: Gastroenterology

## 2018-01-05 VITALS — BP 134/19 | HR 98 | Ht 66.0 in | Wt 213.0 lb

## 2018-01-05 DIAGNOSIS — E8881 Metabolic syndrome: Secondary | ICD-10-CM | POA: Diagnosis not present

## 2018-01-05 DIAGNOSIS — K649 Unspecified hemorrhoids: Secondary | ICD-10-CM | POA: Diagnosis not present

## 2018-01-05 DIAGNOSIS — Z6835 Body mass index (BMI) 35.0-35.9, adult: Secondary | ICD-10-CM | POA: Diagnosis not present

## 2018-01-05 DIAGNOSIS — R194 Change in bowel habit: Secondary | ICD-10-CM | POA: Diagnosis not present

## 2018-01-05 DIAGNOSIS — E669 Obesity, unspecified: Secondary | ICD-10-CM | POA: Diagnosis not present

## 2018-01-05 MED ORDER — HYDROCORTISONE 2.5 % RE CREA: 1.0000 "application " | TOPICAL_CREAM | Freq: Two times a day (BID) | RECTAL | 1 refills | Status: DC

## 2018-01-05 NOTE — Patient Instructions (Addendum)
You have been scheduled for a colonoscopy. Please follow written instructions given to you at your visit today.  Please pick up your prep supplies at the pharmacy within the next 1-3 days. If you use inhalers (even only as needed), please bring them with you on the day of your procedure.  We sent a prescription to your pharmacy for Hydrocortisone cream 2.5 %, Use twice daily rectally.  Take Miralax daily 17 grams in 8 oz of liquid.   Take Benefiber or Citrucel powder 2 teaspoons daily in 8 oz of liquid.   Normal BMI (Body Mass Index- based on height and weight) is between 19 and 25. Your BMI today is Body mass index is 34.38 kg/m. Marland Kitchen Please consider follow up  regarding your BMI with your Primary Care Provider.

## 2018-01-05 NOTE — Progress Notes (Signed)
01/05/2018 Tracey Rodriguez [id number] 1960-07-24   HISTORY OF PRESENT ILLNESS:  This is a 57 year old female who is a patient of Dr. Celesta Aver.  She was seen here earlier this year with reflux issues.  Now she is here today with complaints of change in bowel habits.  She says that this all began back in April when she came off of the tramadol.  Since then she has been having issues with stools that alternate between mushy stools that are hard to get out and get "stuck" in her rectum and then somewhat harder/firmer stools for which she has to press on her perineum in order to get them to pass.  Her last colonoscopy was almost 10 years ago (will be 10 years in January) but she was hoping to have it done this year since she met her deductible.  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  Recently she developed an issue with what she thinks is a prolapsed internal hemorrhoid.  Has been using preparation H suppositories and it has gotten better.  Was very uncomfortable, but has becomes smaller in size.    Past Medical History:  Diagnosis Date  . Arthritis    deg disc disease  - lower back, neck  . Cancer ([place])    left breast surgery-lumpectomy  . Disorder of vocal cord   . Factor V deficiency ([place])   . Fibromyalgia   . GERD (gastroesophageal reflux disease)   . Heart murmur   . Hyperlipidemia   . Hypothyroidism    goiter  . Neuromuscular disorder ([place])    sciatic nerve  . PONV (postoperative nausea and vomiting)   . SVD (spontaneous vaginal delivery)    x 2   Past Surgical History:  Procedure Laterality Date  . ABLATION    . APPENDECTOMY    . BREAST EXCISIONAL BIOPSY Left   . BREAST SURGERY     left- lumpectomy  . DILATATION & CURRETTAGE/HYSTEROSCOPY WITH RESECTOCOPE N/A 04/05/2012   Procedure: hysteroscopy with endocervical curretting;  Surgeon: Allyn Kenner, DO;  Location: [facility];  Service: Gynecology;  Laterality: N/A;  . DILATION AND CURETTAGE OF UTERUS    .  EYE SURGERY Bilateral 10/2017   cataract extraction   . NOSE SURGERY      reports that she quit smoking about 17 years ago. Her smoking use included cigarettes. She has a 66.00 pack-year smoking history. She has never used smokeless tobacco. She reports that she drinks alcohol. She reports that she does not use drugs. family history includes Breast cancer (age of onset: 90) in her maternal aunt; Breast cancer (age of onset: 55) in her mother; Cancer in her brother; Clotting disorder in her father; Colon polyps in her brother and brother; Crohn's disease in her father; Heart attack in her brother and father; Heart disease in her maternal grandfather; Heart disease (age of onset: 44) in her father; Irritable bowel syndrome in her son; Kidney disease in her paternal uncle; Liver cancer in her maternal grandmother; Lung cancer in her brother and maternal grandfather; Ovarian cancer in her mother; Rheumatologic disease in her father. Allergies  Allergen Reactions  . Capsaicin Anaphylaxis    Pt not sure of name   . Etodolac Shortness Of Breath    REACTION: dyspnea/tightness in chest  . Bee Venom   . Crestor [Rosuvastatin] Itching    All statins   . Doxycycline Nausea And Vomiting  . Pepto-Bismol [Bismuth] Nausea And Vomiting  .  Pork-Derived Products Nausea And Vomiting  . Sulfonamide Derivatives Nausea And Vomiting    REACTION: vomiting      Outpatient Encounter Medications as of 01/05/2018  Medication Sig  . albuterol (PROVENTIL HFA;VENTOLIN HFA) 108 (90 BASE) MCG/ACT inhaler Inhale 2 puffs into the lungs every 6 (six) hours as needed for wheezing or shortness of breath.  . cetirizine (ZYRTEC) 10 MG tablet Take 10 mg by mouth daily.  . diclofenac sodium (VOLTAREN) 1 % GEL Apply 1 application topically daily as needed (arthritis).  . Doxycycline Hyclate (DORYX) 200 MG TBEC Take 200 mg by mouth daily as needed (skin inflammation).  Marland Kitchen doxylamine, Sleep, (UNISOM) 25 MG tablet Take 25 mg by mouth  at bedtime as needed.  Marland Kitchen EPINEPHrine (EPIPEN JR) 0.15 MG/0.3ML injection epinephrine (Jr) 0.15 mg/0.3 mL injection,auto-injector  INJECT AS DIRECTED  . fluocinonide ointment (LIDEX) 4.70 % Apply 1 application topically as needed.   . furosemide (LASIX) 20 MG tablet Take 20 mg by mouth daily as needed for fluid or edema.  Marland Kitchen levothyroxine (SYNTHROID, LEVOTHROID) 50 MCG tablet Take 50 mcg by mouth daily.  . methocarbamol (ROBAXIN) 500 MG tablet Take 1 tablet (500 mg total) by mouth 2 (two) times daily as needed for muscle spasms.  . mirabegron ER (MYRBETRIQ) 50 MG TB24 Take 50 mg by mouth daily.  . Multiple Vitamins-Minerals (MULTIVITAMIN PO) Take by mouth daily.  . mupirocin ointment (BACTROBAN) 2 % Place 1 application into the nose daily as needed (blood blisters).  Marland Kitchen NEXIUM 40 MG capsule Take 1 capsule (40 mg total) by mouth 2 (two) times daily before a meal.  . OVER THE COUNTER MEDICATION Take 2 capsules by mouth daily. Med Name: STRESS B COMPLEX CAPSULE  . phentermine 37.5 MG capsule Take 18.25 mg by mouth every morning.  Marland Kitchen scopolamine (TRANSDERM-SCOP, 1.5 MG,) 1 MG/3DAYS Transderm-Scop 1.5 mg transdermal patch (1 mg over 3 days)  . TURMERIC PO Take by mouth daily.  . [DISCONTINUED] 0.9 %  sodium chloride infusion    No facility-administered encounter medications on file as of 01/05/2018.      REVIEW OF SYSTEMS  : All other systems reviewed and negative except where noted in the History of Present Illness.   PHYSICAL EXAM: BP (!) 134/19   Pulse 98   Ht 5' 6"  (1.676 m)   Wt 213 lb (96.6 kg)   BMI 34.38 kg/m  General: Well developed white female in no acute distress Head: Normocephalic and atraumatic Eyes:  Sclerae anicteric, conjunctiva pink. Ears: Normal auditory acuity  Lungs: Clear throughout to auscultation; no increased WOB. Heart: Regular rate and rhythm; no M/R/G. Abdomen: Soft, non-distended.  BS present.  Mild diffuse TTP. Rectal:  Will be done at the time of  colonoscopy.  She somewhat declined today since she is having procedure soon. Musculoskeletal: Symmetrical with no gross deformities  Skin: No lesions on visible extremities Extremities: No edema  Neurological: Alert oriented x 4, grossly non-focal Psychological:  Alert and cooperative. Normal mood and affect  ASSESSMENT AND PLAN: *Change in bowel habits: Alternates between mushy stools that are hard to get out and then somewhat harder stools for which she has to press on her perineum in order to get them to pass.  Sounds like she may have a rectocele.  Last colonoscopy was almost 10 years ago.  She declined rectal exam today as we discussed scheduling colonoscopy and she will be having that the near future with Dr. Carlean Purl.  In the meantime I would like  her to try taking MiraLAX 17 g daily mixed in 8 ounces of liquid as well as a daily powder fiber supplement such as Benefiber or Citrucel daily *Hemorrhoids: Reports recent issue with hemorrhoid for which she has been using Preparation H suppositories with some relief.  Will try some hydrocortisone cream as needed as well.  **The risks, benefits, and alternatives to colonoscopy were discussed with the patient and she consents to proceed.    CC:  Aura Dials, MD

## 2018-01-06 ENCOUNTER — Ambulatory Visit: Payer: 59 | Admitting: Physician Assistant

## 2018-01-07 DIAGNOSIS — M5136 Other intervertebral disc degeneration, lumbar region: Secondary | ICD-10-CM | POA: Diagnosis not present

## 2018-01-07 DIAGNOSIS — R945 Abnormal results of liver function studies: Secondary | ICD-10-CM | POA: Diagnosis not present

## 2018-01-07 DIAGNOSIS — M5030 Other cervical disc degeneration, unspecified cervical region: Secondary | ICD-10-CM | POA: Diagnosis not present

## 2018-01-07 DIAGNOSIS — M797 Fibromyalgia: Secondary | ICD-10-CM | POA: Diagnosis not present

## 2018-01-12 DIAGNOSIS — M5136 Other intervertebral disc degeneration, lumbar region: Secondary | ICD-10-CM | POA: Diagnosis not present

## 2018-01-12 DIAGNOSIS — M5030 Other cervical disc degeneration, unspecified cervical region: Secondary | ICD-10-CM | POA: Diagnosis not present

## 2018-01-12 DIAGNOSIS — M797 Fibromyalgia: Secondary | ICD-10-CM | POA: Diagnosis not present

## 2018-01-13 ENCOUNTER — Other Ambulatory Visit: Payer: Self-pay | Admitting: Family Medicine

## 2018-01-13 DIAGNOSIS — R945 Abnormal results of liver function studies: Secondary | ICD-10-CM

## 2018-01-13 DIAGNOSIS — R7989 Other specified abnormal findings of blood chemistry: Secondary | ICD-10-CM

## 2018-01-19 DIAGNOSIS — M797 Fibromyalgia: Secondary | ICD-10-CM | POA: Diagnosis not present

## 2018-01-19 DIAGNOSIS — M5030 Other cervical disc degeneration, unspecified cervical region: Secondary | ICD-10-CM | POA: Diagnosis not present

## 2018-01-19 DIAGNOSIS — M5136 Other intervertebral disc degeneration, lumbar region: Secondary | ICD-10-CM | POA: Diagnosis not present

## 2018-01-21 ENCOUNTER — Encounter: Payer: Self-pay | Admitting: Internal Medicine

## 2018-01-22 ENCOUNTER — Ambulatory Visit
Admission: RE | Admit: 2018-01-22 | Discharge: 2018-01-22 | Disposition: A | Discharge status: Home / Self Care | Payer: 59 | Source: Ambulatory Visit | Attending: Family Medicine | Admitting: Family Medicine

## 2018-01-22 DIAGNOSIS — R7989 Other specified abnormal findings of blood chemistry: Secondary | ICD-10-CM

## 2018-01-22 DIAGNOSIS — K760 Fatty (change of) liver, not elsewhere classified: Secondary | ICD-10-CM | POA: Diagnosis not present

## 2018-01-22 DIAGNOSIS — R945 Abnormal results of liver function studies: Secondary | ICD-10-CM

## 2018-01-26 DIAGNOSIS — M5136 Other intervertebral disc degeneration, lumbar region: Secondary | ICD-10-CM | POA: Diagnosis not present

## 2018-01-26 DIAGNOSIS — M797 Fibromyalgia: Secondary | ICD-10-CM | POA: Diagnosis not present

## 2018-01-26 DIAGNOSIS — M5030 Other cervical disc degeneration, unspecified cervical region: Secondary | ICD-10-CM | POA: Diagnosis not present

## 2018-01-27 ENCOUNTER — Ambulatory Visit (AMBULATORY_SURGERY_CENTER): Payer: 59 | Admitting: Internal Medicine

## 2018-01-27 ENCOUNTER — Encounter: Payer: Self-pay | Admitting: Internal Medicine

## 2018-01-27 VITALS — BP 161/86 | HR 80 | Temp 98.9°F | Resp 19 | Ht 66.0 in | Wt 213.0 lb

## 2018-01-27 DIAGNOSIS — R194 Change in bowel habit: Secondary | ICD-10-CM | POA: Diagnosis not present

## 2018-01-27 DIAGNOSIS — K5730 Diverticulosis of large intestine without perforation or abscess without bleeding: Secondary | ICD-10-CM

## 2018-01-27 DIAGNOSIS — D120 Benign neoplasm of cecum: Secondary | ICD-10-CM | POA: Diagnosis not present

## 2018-01-27 DIAGNOSIS — N816 Rectocele: Secondary | ICD-10-CM | POA: Insufficient documentation

## 2018-01-27 HISTORY — DX: Rectocele: N81.6

## 2018-01-27 MED ORDER — SODIUM CHLORIDE 0.9 % IV SOLN: 500.0000 mL | Freq: Once | INTRAVENOUS | Status: DC

## 2018-01-27 NOTE — Patient Instructions (Addendum)
I found and removed one tiny polyp - not causing problems but we take them out. I will let you know pathology results and when to have another routine colonoscopy by mail and/or My Chart.  I found a rectocele on your exam - bulge of the rectum into the vagina.  You need to see gynecologist and may need other testing - will discuss  I appreciate the opportunity to care for you. Gatha Mayer, MD, FACG    YOU HAD AN ENDOSCOPIC PROCEDURE TODAY AT [facility]:   Refer to the procedure report that was given to you for any specific questions about what was found during the examination.  If the procedure report does not answer your questions, please call your gastroenterologist to clarify.  If you requested that your care partner not be given the details of your procedure findings, then the procedure report has been included in a sealed envelope for you to review at your convenience later.  YOU SHOULD EXPECT: Some feelings of bloating in the abdomen. Passage of more gas than usual.  Walking can help get rid of the air that was put into your GI tract during the procedure and reduce the bloating. If you had a lower endoscopy (such as a colonoscopy or flexible sigmoidoscopy) you may notice spotting of blood in your stool or on the toilet paper. If you underwent a bowel prep for your procedure, you may not have a normal bowel movement for a few days.  Please Note:  You might notice some irritation and congestion in your nose or some drainage.  This is from the oxygen used during your procedure.  There is no need for concern and it should clear up in a day or so.  SYMPTOMS TO REPORT IMMEDIATELY:   Following lower endoscopy (colonoscopy or flexible sigmoidoscopy):  Excessive amounts of blood in the stool  Significant tenderness or worsening of abdominal pains  Swelling of the abdomen that is new, acute  Fever of 100F or higher   For urgent or emergent issues, a  gastroenterologist can be reached at any hour by calling [phone].   DIET:  We do recommend a small meal at first, but then you may proceed to your regular diet.  Drink plenty of fluids but you should avoid alcoholic beverages for 24 hours.  ACTIVITY:  You should plan to take it easy for the rest of today and you should NOT DRIVE or use heavy machinery until tomorrow (because of the sedation medicines used during the test).    FOLLOW UP: Our staff will call the number listed on your records the next business day following your procedure to check on you and address any questions or concerns that you may have regarding the information given to you following your procedure. If we do not reach you, we will leave a message.  However, if you are feeling well and you are not experiencing any problems, there is no need to return our call.  We will assume that you have returned to your regular daily activities without incident.  If any biopsies were taken you will be contacted by phone or by letter within the next 1-3 weeks.  Please call us at [phone] if you have not heard about the biopsies in 3 weeks.    SIGNATURES/CONFIDENTIALITY: You and/or your care partner have signed paperwork which will be entered into your electronic medical record.  These signatures attest to the fact that that the information above  on your After Visit Summary has been reviewed and is understood.  Full responsibility of the confidentiality of this discharge information lies with you and/or your care-partner.   Handouts were given to your care partner on polyps and diverticulosis. You may resume your current medications today. Await biopsy results. Dr. Celesta Aver nurse will call you with appointment. Please call if any questions or concerns.

## 2018-01-27 NOTE — Progress Notes (Signed)
No problems noted in the recovery room. maw 

## 2018-01-27 NOTE — Op Note (Signed)
[facility] Patient Name: Tracey Rodriguez Procedure Date: 01/27/2018 3:17 PM MRN: [id number] Endoscopist: Gatha Mayer , MD Age: 57 Referring MD:  Date of Birth: Dec 09, 1960 Gender: Female Account #: [id number] Procedure:                Colonoscopy Indications:              Change in bowel habits Medicines:                Propofol per Anesthesia, Monitored Anesthesia Care Procedure:                Pre-Anesthesia Assessment:                           - Prior to the procedure, a History and Physical                            was performed, and patient medications and                            allergies were reviewed. The patient's tolerance of                            previous anesthesia was also reviewed. The risks                            and benefits of the procedure and the sedation                            options and risks were discussed with the patient.                            All questions were answered, and informed consent                            was obtained. Prior Anticoagulants: The patient has                            taken no previous anticoagulant or antiplatelet                            agents. ASA Grade Assessment: III - A patient with                            severe systemic disease. After reviewing the risks                            and benefits, the patient was deemed in                            satisfactory condition to undergo the procedure.                           After obtaining informed consent, the colonoscope  was passed under direct vision. Throughout the                            procedure, the patient's blood pressure, pulse, and                            oxygen saturations were monitored continuously. The                            Colonoscope was introduced through the anus and                            advanced to the the cecum, identified by                            appendiceal  orifice and ileocecal valve. The                            colonoscopy was performed without difficulty. The                            patient tolerated the procedure well. The quality                            of the bowel preparation was excellent. The                            ileocecal valve, appendiceal orifice, and rectum                            were photographed. Scope In: 3:37:09 PM Scope Out: 3:50:23 PM Scope Withdrawal Time: 0 hours 10 minutes 15 seconds  Total Procedure Duration: 0 hours 13 minutes 14 seconds  Findings:                 The perianal examination was normal.                           The digital rectal exam findings include rectocele.                           A 3 mm polyp was found in the appendiceal orifice.                            The polyp was sessile. The polyp was removed with a                            cold snare. Resection and retrieval were complete.                            Verification of patient identification for the                            specimen was done. Estimated blood loss was minimal.  A few diverticula were found in the sigmoid colon.                           The exam was otherwise without abnormality on                            direct and retroflexion views. Complications:            No immediate complications. Estimated Blood Loss:     Estimated blood loss was minimal. Impression:               - Rectocele found on digital rectal exam.                           - One 3 mm polyp at the appendiceal orifice,                            removed with a cold snare. Resected and retrieved.                           - Diverticulosis in the sigmoid colon.                           - The examination was otherwise normal on direct                            and retroflexion views. Recommendation:           - Patient has a contact number available for                            emergencies. The signs and  symptoms of potential                            delayed complications were discussed with the                            patient. Return to normal activities tomorrow.                            Written discharge instructions were provided to the                            patient.                           - Resume previous diet.                           - Continue present medications.                           - Repeat colonoscopy is recommended. The                            colonoscopy date will be determined after pathology  results from today's exam become available for                            review.                           - She has a rectocele, and other signs of pelvic                            floor dysfunction with urinary stress incontinence,                            overactiove bladder, and I thibnk abnormal rectal                            descent and relaxation on rectal exam.                           I will discuss seeing GYN and also possibly doing                            additional testing - defecography perhaps. Once I                            dioscus and decide will notify my RN to arrange. Gatha Mayer, MD 01/27/2018 4:05:55 PM This report has been signed electronically.

## 2018-01-27 NOTE — Progress Notes (Signed)
Pt's states no medical or surgical changes since previsit or office visit. 

## 2018-01-27 NOTE — Progress Notes (Signed)
Alert and oriented x3, pleased with MAC, report to RN  

## 2018-01-28 ENCOUNTER — Telehealth: Payer: Self-pay | Admitting: *Deleted

## 2018-01-28 DIAGNOSIS — M5136 Other intervertebral disc degeneration, lumbar region: Secondary | ICD-10-CM | POA: Diagnosis not present

## 2018-01-28 DIAGNOSIS — M797 Fibromyalgia: Secondary | ICD-10-CM | POA: Diagnosis not present

## 2018-01-28 DIAGNOSIS — M5030 Other cervical disc degeneration, unspecified cervical region: Secondary | ICD-10-CM | POA: Diagnosis not present

## 2018-01-28 NOTE — Telephone Encounter (Signed)
  Follow up Call-  Call back number 01/27/2018 08/06/2017  Post procedure Call Back phone  # [phone]  Permission to leave phone message Yes Yes  Some recent data might be hidden     Patient questions:  Do you have a fever, pain , or abdominal swelling? No. Pain Score  0 *  Have you tolerated food without any problems? Yes.    Have you been able to return to your normal activities? Yes.    Do you have any questions about your discharge instructions: Diet   No. Medications  No. Follow up visit  No.  Do you have questions or concerns about your Care? Yes.  Patient is still having loose stool told her this should go back to normal in the next few days. Sm  Actions: * If pain score is 4 or above: No action needed, pain <4.

## 2018-01-29 ENCOUNTER — Telehealth: Payer: Self-pay

## 2018-01-29 DIAGNOSIS — R194 Change in bowel habit: Secondary | ICD-10-CM

## 2018-01-29 DIAGNOSIS — R350 Frequency of micturition: Secondary | ICD-10-CM

## 2018-01-29 DIAGNOSIS — N816 Rectocele: Secondary | ICD-10-CM

## 2018-01-29 DIAGNOSIS — R32 Unspecified urinary incontinence: Secondary | ICD-10-CM

## 2018-01-29 NOTE — Telephone Encounter (Signed)
-----   Message from Gatha Mayer, MD sent at 01/27/2018  6:19 PM EST ----- Regarding: MR defecography She needs MR defecography  MR pelvis - no contrast Attention Dr. Earle Gell to read  Needs in December 2019 (befor end of year)  Dx : rectocele, altered bowel habits, urinary frequency and urinary incontinence

## 2018-01-29 NOTE — Telephone Encounter (Signed)
I left a message for the patient that she will be contacted directly to schedule a MRI with [facility].

## 2018-02-02 DIAGNOSIS — M5030 Other cervical disc degeneration, unspecified cervical region: Secondary | ICD-10-CM | POA: Diagnosis not present

## 2018-02-02 DIAGNOSIS — M797 Fibromyalgia: Secondary | ICD-10-CM | POA: Diagnosis not present

## 2018-02-02 DIAGNOSIS — M5136 Other intervertebral disc degeneration, lumbar region: Secondary | ICD-10-CM | POA: Diagnosis not present

## 2018-02-04 ENCOUNTER — Encounter: Payer: Self-pay | Admitting: Internal Medicine

## 2018-02-04 DIAGNOSIS — Z860101 Personal history of adenomatous and serrated colon polyps: Secondary | ICD-10-CM

## 2018-02-04 DIAGNOSIS — M797 Fibromyalgia: Secondary | ICD-10-CM | POA: Diagnosis not present

## 2018-02-04 DIAGNOSIS — Z86010 Personal history of colonic polyps: Secondary | ICD-10-CM

## 2018-02-04 DIAGNOSIS — M5136 Other intervertebral disc degeneration, lumbar region: Secondary | ICD-10-CM | POA: Diagnosis not present

## 2018-02-04 DIAGNOSIS — M5030 Other cervical disc degeneration, unspecified cervical region: Secondary | ICD-10-CM | POA: Diagnosis not present

## 2018-02-04 HISTORY — DX: Personal history of adenomatous and serrated colon polyps: Z86.0101

## 2018-02-04 HISTORY — DX: Personal history of colonic polyps: Z86.010

## 2018-02-04 NOTE — Progress Notes (Signed)
Diminutive adenoma Recall 5 years 2025 (jan) My Chart

## 2018-02-08 DIAGNOSIS — M5136 Other intervertebral disc degeneration, lumbar region: Secondary | ICD-10-CM | POA: Diagnosis not present

## 2018-02-08 DIAGNOSIS — M5030 Other cervical disc degeneration, unspecified cervical region: Secondary | ICD-10-CM | POA: Diagnosis not present

## 2018-02-08 DIAGNOSIS — M797 Fibromyalgia: Secondary | ICD-10-CM | POA: Diagnosis not present

## 2018-02-11 ENCOUNTER — Ambulatory Visit
Admission: RE | Admit: 2018-02-11 | Discharge: 2018-02-11 | Disposition: A | Discharge status: Home / Self Care | Payer: 59 | Source: Ambulatory Visit | Attending: Internal Medicine | Admitting: Internal Medicine

## 2018-02-11 DIAGNOSIS — R350 Frequency of micturition: Secondary | ICD-10-CM

## 2018-02-11 DIAGNOSIS — N816 Rectocele: Secondary | ICD-10-CM | POA: Diagnosis not present

## 2018-02-11 DIAGNOSIS — R32 Unspecified urinary incontinence: Secondary | ICD-10-CM

## 2018-02-11 DIAGNOSIS — M797 Fibromyalgia: Secondary | ICD-10-CM | POA: Diagnosis not present

## 2018-02-11 DIAGNOSIS — M5030 Other cervical disc degeneration, unspecified cervical region: Secondary | ICD-10-CM | POA: Diagnosis not present

## 2018-02-11 DIAGNOSIS — R194 Change in bowel habit: Secondary | ICD-10-CM

## 2018-02-11 DIAGNOSIS — M5136 Other intervertebral disc degeneration, lumbar region: Secondary | ICD-10-CM | POA: Diagnosis not present

## 2018-02-12 DIAGNOSIS — R945 Abnormal results of liver function studies: Secondary | ICD-10-CM | POA: Diagnosis not present

## 2018-02-12 DIAGNOSIS — M5030 Other cervical disc degeneration, unspecified cervical region: Secondary | ICD-10-CM | POA: Diagnosis not present

## 2018-02-12 DIAGNOSIS — M5136 Other intervertebral disc degeneration, lumbar region: Secondary | ICD-10-CM | POA: Diagnosis not present

## 2018-02-12 DIAGNOSIS — R740 Nonspecific elevation of levels of transaminase and lactic acid dehydrogenase [LDH]: Secondary | ICD-10-CM | POA: Diagnosis not present

## 2018-02-12 DIAGNOSIS — M797 Fibromyalgia: Secondary | ICD-10-CM | POA: Diagnosis not present

## 2018-02-15 NOTE — Progress Notes (Signed)
Please let her know she has pelvic floor weakness problems I would like her to see urogynecology - Mina Marble - she can see them at [facility] (she has seen urology there) - Refer to Dr. Zigmund Daniel Physical therapy might help this but could need surgery to fix Reason for consult - pelvic floor laxity -  ? Try PT vs surgery  Have Areyana take a disc of the MR also

## 2018-02-18 ENCOUNTER — Other Ambulatory Visit: Payer: Self-pay | Admitting: Family Medicine

## 2018-02-18 DIAGNOSIS — L905 Scar conditions and fibrosis of skin: Secondary | ICD-10-CM

## 2018-02-18 DIAGNOSIS — R7989 Other specified abnormal findings of blood chemistry: Secondary | ICD-10-CM

## 2018-02-18 DIAGNOSIS — R945 Abnormal results of liver function studies: Secondary | ICD-10-CM

## 2018-02-23 ENCOUNTER — Telehealth: Payer: Self-pay | Admitting: Internal Medicine

## 2018-02-23 ENCOUNTER — Ambulatory Visit
Admission: RE | Admit: 2018-02-23 | Discharge: 2018-02-23 | Disposition: A | Discharge status: Home / Self Care | Payer: 59 | Source: Ambulatory Visit | Attending: Family Medicine | Admitting: Family Medicine

## 2018-02-23 DIAGNOSIS — R945 Abnormal results of liver function studies: Secondary | ICD-10-CM

## 2018-02-23 DIAGNOSIS — R7989 Other specified abnormal findings of blood chemistry: Secondary | ICD-10-CM

## 2018-02-23 DIAGNOSIS — L905 Scar conditions and fibrosis of skin: Secondary | ICD-10-CM

## 2018-02-23 DIAGNOSIS — K760 Fatty (change of) liver, not elsewhere classified: Secondary | ICD-10-CM | POA: Diagnosis not present

## 2018-02-23 NOTE — Telephone Encounter (Signed)
Pt called requesting colon report and MRI results sent to obgyn Dr. Carlis Abbott, phone # [phone]. She did not have Dr. Ainsley Spinner fax #.

## 2018-02-23 NOTE — Telephone Encounter (Signed)
I spoke to [place] and told her I will get this faxed over to Dr Carlis Abbott in the morning as soon as I get the fax #.

## 2018-02-24 NOTE — Telephone Encounter (Signed)
Results were faxed to fax # [phone] and we got confirmation that it went thru.

## 2018-03-11 DIAGNOSIS — E6609 Other obesity due to excess calories: Secondary | ICD-10-CM | POA: Diagnosis not present

## 2018-03-11 DIAGNOSIS — Z6834 Body mass index (BMI) 34.0-34.9, adult: Secondary | ICD-10-CM | POA: Diagnosis not present

## 2018-03-22 NOTE — Progress Notes (Deleted)
Office Visit Note  Patient: Tracey Rodriguez             Date of Birth: December 27, 1960           MRN: [id number]             PCP: Aura Dials, MD Referring: Aura Dials, MD Visit Date: 04/05/2018 Occupation: @GUAROCC @  Subjective:  No chief complaint on file.   History of Present Illness: Tracey Rodriguez is a 58 y.o. female ***   Activities of Daily Living:  Patient reports morning stiffness for *** {minute/hour:19697}.   Patient {ACTIONS;DENIES/REPORTS:21021675::"Denies"} nocturnal pain.  Difficulty dressing/grooming: {ACTIONS;DENIES/REPORTS:21021675::"Denies"} Difficulty climbing stairs: {ACTIONS;DENIES/REPORTS:21021675::"Denies"} Difficulty getting out of chair: {ACTIONS;DENIES/REPORTS:21021675::"Denies"} Difficulty using hands for taps, buttons, cutlery, and/or writing: {ACTIONS;DENIES/REPORTS:21021675::"Denies"}  No Rheumatology ROS completed.   PMFS History:  Patient Active Problem List   Diagnosis Date Noted  . Hx of adenomatous polyp of colon 02/04/2018  . Rectocele 01/27/2018  . Change in bowel habits 01/05/2018  . Hemorrhoids 01/05/2018  . Hoarseness 07/30/2017  . Gastroesophageal reflux disease 07/30/2017  . History of breast cancer 09/11/2016  . Other fatigue 03/15/2016  . Primary insomnia 03/15/2016  . Primary osteoarthritis of both hands 03/15/2016  . DDD cervical spine 03/15/2016  . 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 History of bilateral carpal tunnel release 03/15/2016  . Unspecified hypothyroidism 03/30/2013  . Fibromyalgia 03/30/2013  . Breast cancer ([place]) 03/30/2013  . 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 Dyspnea 02/02/2013  . COPD (chronic obstructive pulmonary disease) ([place]) 02/02/2013  . Restrictive lung disease 02/02/2013    Past Medical History:  Diagnosis Date  . Arthritis    deg disc disease  - lower back, neck  . Cancer ([place])    left breast surgery-lumpectomy  . Disorder of vocal cord   . Factor V deficiency  ([place])   . Fibromyalgia   . GERD (gastroesophageal reflux disease)   . Heart murmur   . Hx of adenomatous polyp of colon 02/04/2018  . Hyperlipidemia   . Hypothyroidism    goiter  . Neuromuscular disorder ([place])    sciatic nerve  . PONV (postoperative nausea and vomiting)   . Rectocele 01/27/2018  . SVD (spontaneous vaginal delivery)    x 2    Family History  Problem Relation Age of Onset  . Heart disease Father 60  . Heart attack Father   . Clotting disorder Father   . Rheumatologic disease Father   . Crohn's disease Father   . Heart disease Maternal Grandfather   . Lung cancer Maternal Grandfather   . Breast cancer Mother 13  . Ovarian cancer Mother   . Liver cancer Maternal Grandmother   . Heart attack Brother   . Cancer Brother   . Lung cancer Brother   . Colon polyps Brother   . Colon polyps Brother   . Breast cancer Maternal Aunt 60  . Kidney disease Paternal Uncle   . Irritable bowel syndrome Son    Past Surgical History:  Procedure Laterality Date  . ABLATION    . APPENDECTOMY    . BREAST EXCISIONAL BIOPSY Left   . BREAST SURGERY     left- lumpectomy  . DILATATION & CURRETTAGE/HYSTEROSCOPY WITH RESECTOCOPE N/A 04/05/2012   Procedure: hysteroscopy with endocervical curretting;  Surgeon: Allyn Kenner, DO;  Location: [facility];  Service: Gynecology;  Laterality: N/A;  . DILATION AND CURETTAGE OF UTERUS    . EYE SURGERY Bilateral 10/2017   cataract extraction   .  NOSE SURGERY     Social History   Social History Narrative  . Not on file   Immunization History  Administered Date(s) Administered  . Influenza Split 11/17/2012  . Influenza-Unspecified 11/24/2016     Objective: Vital Signs: There were no vitals taken for this visit.   Physical Exam   Musculoskeletal Exam: ***  CDAI Exam: CDAI Score: Not documented Patient Global Assessment: Not documented; Provider Global Assessment: Not documented Swollen: Not documented; Tender: Not  documented Joint Exam   Not documented   There is currently no information documented on the homunculus. Go to the Rheumatology activity and complete the homunculus joint exam.  Investigation: No additional findings.  Imaging: [place] Elastography Liver  Result Date: 02/23/2018 CLINICAL DATA:  Elevated LFTs EXAM: US LIVER ELASTOGRAPHY TECHNIQUE: Ultrasound elastography evaluation of the liver was performed. A region of interest was placed in the right lobe of the liver. Following application of a compressive sonographic pulse, shear waves were detected in the adjacent hepatic tissue and the shear wave velocity was calculated. Multiple assessments were performed at the selected site. Median shear wave velocity is correlated to a Metavir fibrosis score. COMPARISON:  01/22/2018 FINDINGS: Liver: Increased echotexture compatible with fatty infiltration. No focal abnormality or biliary ductal dilatation. Portal vein is patent on color Doppler imaging with normal direction of blood flow towards the liver. ULTRASOUND HEPATIC ELASTOGRAPHY Device: Siemens Helix VTQ Patient position: Oblique Transducer: 6C1 Number of measurements: 10 Hepatic segment:  8 Median velocity:   3.01 m/sec IQR: 0.96 IQR/Median velocity ratio: 0.32 Corresponding Metavir fibrosis score:  Some F3 + F4 Risk of fibrosis: High Limitations of exam: None Please note that abnormal shear wave velocities may also be identified in clinical settings other than with hepatic fibrosis, such as: acute hepatitis, elevated right heart and central venous pressures including use of beta blockers, veno-occlusive disease (Budd-Chiari), infiltrative processes such as mastocytosis/amyloidosis/infiltrative tumor, extrahepatic cholestasis, in the post-prandial state, and liver transplantation. Correlation with patient history, laboratory data, and clinical condition recommended. IMPRESSION: Liver: Fatty infiltration of the liver. Elastography: Median hepatic shear wave  velocity is calculated at 3.01 m/sec. Corresponding Metavir fibrosis score is Some F3 + F4. Risk of fibrosis is High. Follow-up: Follow up advised Electronically Signed   By: Rolm Baptise M.D.   On: 02/23/2018 10:45    Recent Labs: Lab Results  Component Value Date   WBC 9.7 09/29/2017   HGB 14.4 09/29/2017   PLT 342 09/29/2017   NA 141 09/29/2017   K 4.0 09/29/2017   CL 104 09/29/2017   CO2 29 09/29/2017   GLUCOSE 86 09/29/2017   BUN 11 09/29/2017   CREATININE 0.91 09/29/2017   BILITOT 0.5 09/29/2017   ALKPHOS 72 03/27/2013   AST 23 09/29/2017   ALT 26 09/29/2017   PROT 7.1 09/29/2017   PROT 7.2 09/29/2017   ALBUMIN 4.4 03/27/2013   CALCIUM 9.2 09/29/2017   GFRAA 81 09/29/2017    Speciality Comments: No specialty comments available.  Procedures:  No procedures performed Allergies: Capsaicin; Etodolac; Bee venom; Crestor [rosuvastatin]; Doxycycline; Pepto-bismol [bismuth]; Pork-derived products; and Sulfonamide derivatives   Assessment / Plan:     Visit Diagnoses: No diagnosis found.   Orders: No orders of the defined types were placed in this encounter.  No orders of the defined types were placed in this encounter.   Face-to-face time spent with patient was *** minutes. Greater than 50% of time was spent in counseling and coordination of care.  Follow-Up Instructions: No follow-ups  on file.   Earnestine Mealing, CMA  Note - This record has been created using Editor, commissioning.  Chart creation errors have been sought, but may not always  have been located. Such creation errors do not reflect on  the standard of medical care.

## 2018-03-24 DIAGNOSIS — N816 Rectocele: Secondary | ICD-10-CM | POA: Diagnosis not present

## 2018-03-24 DIAGNOSIS — K5902 Outlet dysfunction constipation: Secondary | ICD-10-CM | POA: Diagnosis not present

## 2018-03-24 DIAGNOSIS — R152 Fecal urgency: Secondary | ICD-10-CM | POA: Diagnosis not present

## 2018-03-29 ENCOUNTER — Ambulatory Visit: Payer: 59 | Admitting: Rheumatology

## 2018-03-29 DIAGNOSIS — E669 Obesity, unspecified: Secondary | ICD-10-CM | POA: Diagnosis not present

## 2018-03-29 DIAGNOSIS — K760 Fatty (change of) liver, not elsewhere classified: Secondary | ICD-10-CM | POA: Diagnosis not present

## 2018-03-29 DIAGNOSIS — Z6833 Body mass index (BMI) 33.0-33.9, adult: Secondary | ICD-10-CM | POA: Diagnosis not present

## 2018-04-02 DIAGNOSIS — J209 Acute bronchitis, unspecified: Secondary | ICD-10-CM | POA: Diagnosis not present

## 2018-04-05 ENCOUNTER — Ambulatory Visit: Payer: 59 | Admitting: Rheumatology

## 2018-04-09 DIAGNOSIS — R945 Abnormal results of liver function studies: Secondary | ICD-10-CM | POA: Diagnosis not present

## 2018-04-09 DIAGNOSIS — B349 Viral infection, unspecified: Secondary | ICD-10-CM | POA: Diagnosis not present

## 2018-04-12 DIAGNOSIS — K760 Fatty (change of) liver, not elsewhere classified: Secondary | ICD-10-CM | POA: Diagnosis not present

## 2018-05-04 DIAGNOSIS — J40 Bronchitis, not specified as acute or chronic: Secondary | ICD-10-CM | POA: Diagnosis not present

## 2018-07-02 IMAGING — US US THYROID
1 series · 12 of 25 positions shown · non-contrast
Comparison: 09/03/2010, 02/27/2010

CLINICAL DATA: 57-year-old female with a history of hoarseness

EXAM:
THYROID ULTRASOUND
TECHNIQUE: Ultrasound examination of the thyroid gland and adjacent soft
tissues was performed.

[Series 1: us thyroid · 0.04mm/px · 12 of 54 slices shown]
[im 3/54]
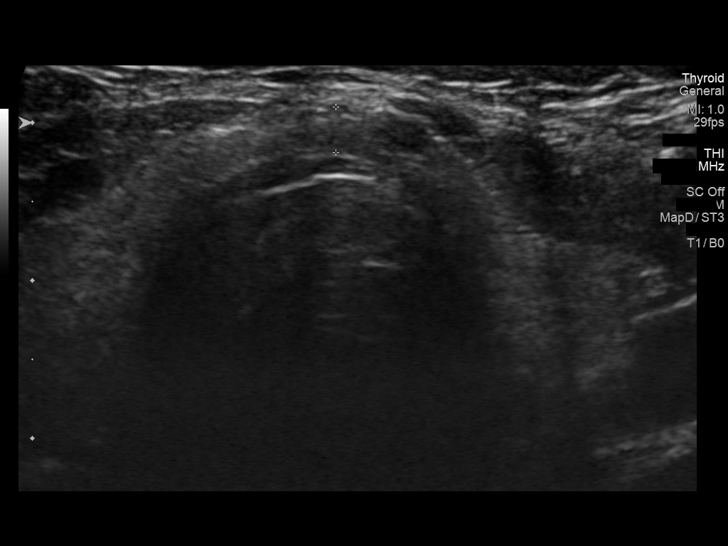
[im 7/54]
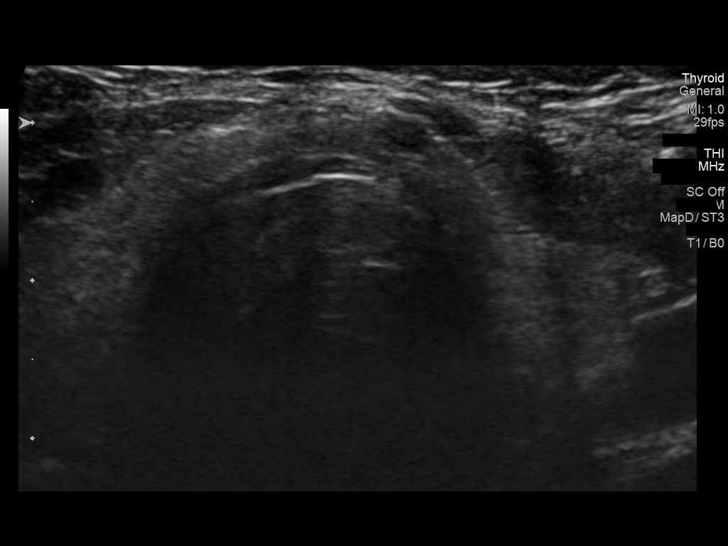
[im 12/54]
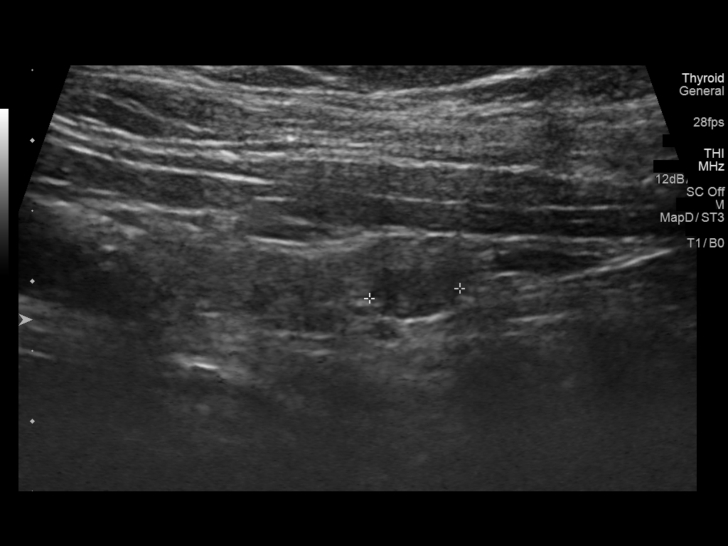
[im 16/54]
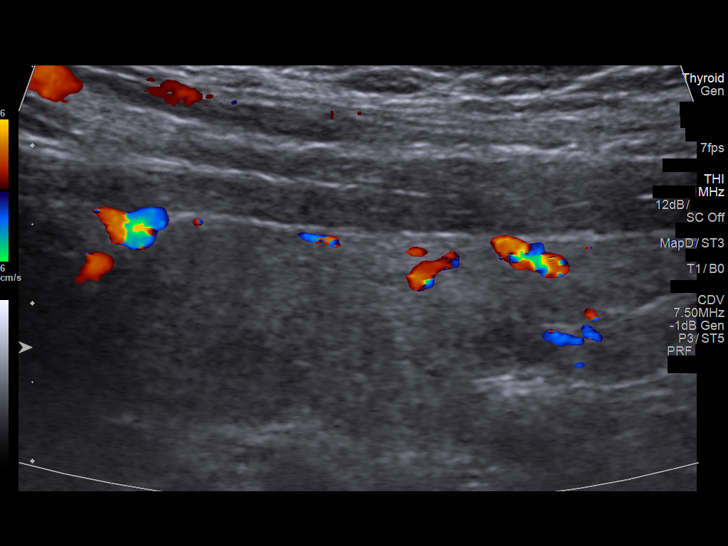
[im 20/54]
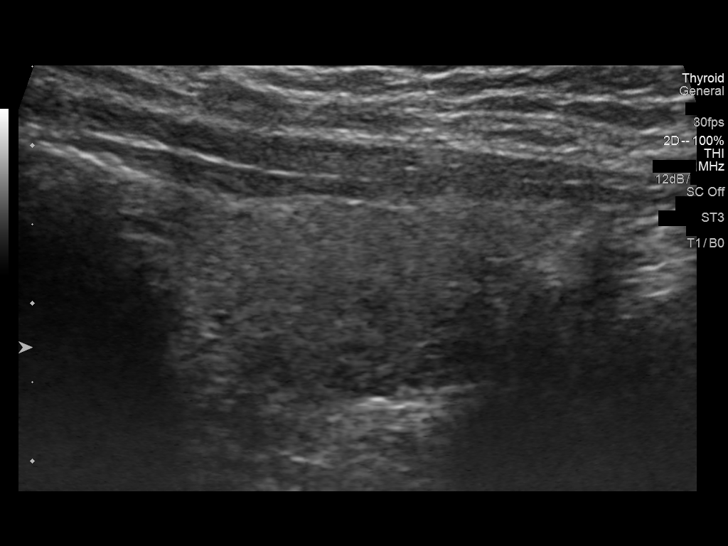
[im 25/54]
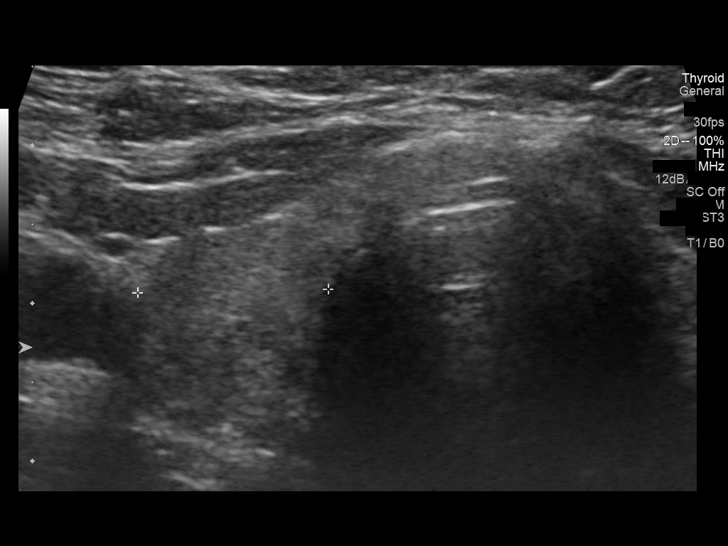
[im 29/54]
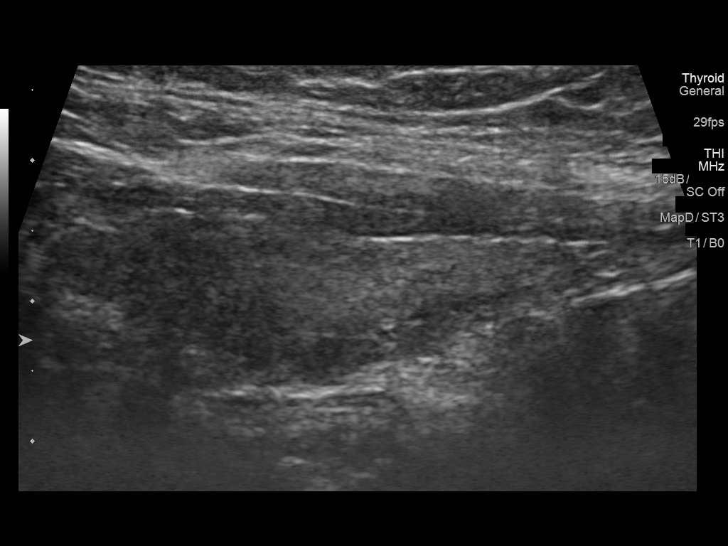
[im 34/54]
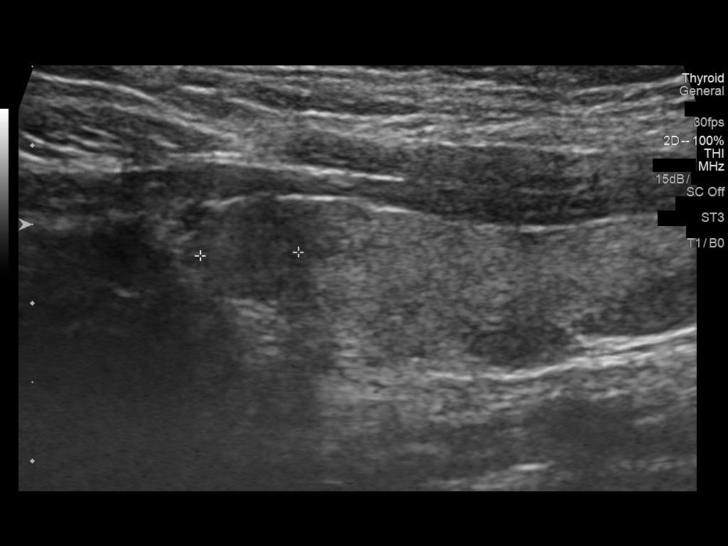
[im 38/54]
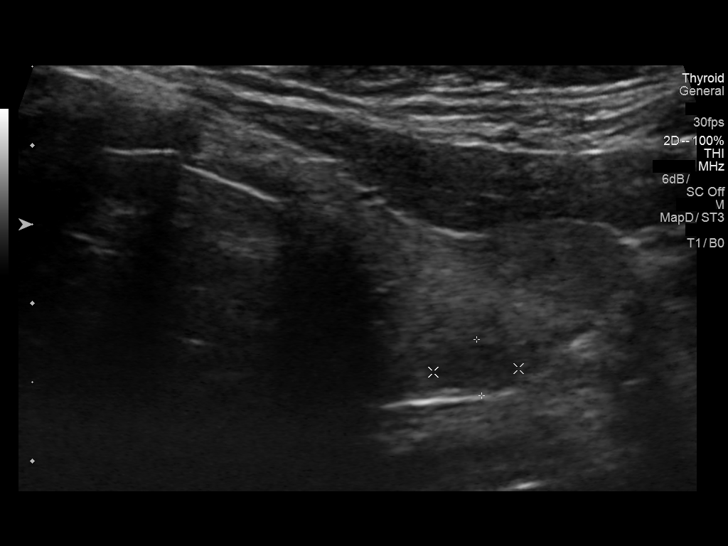
[im 42/54]
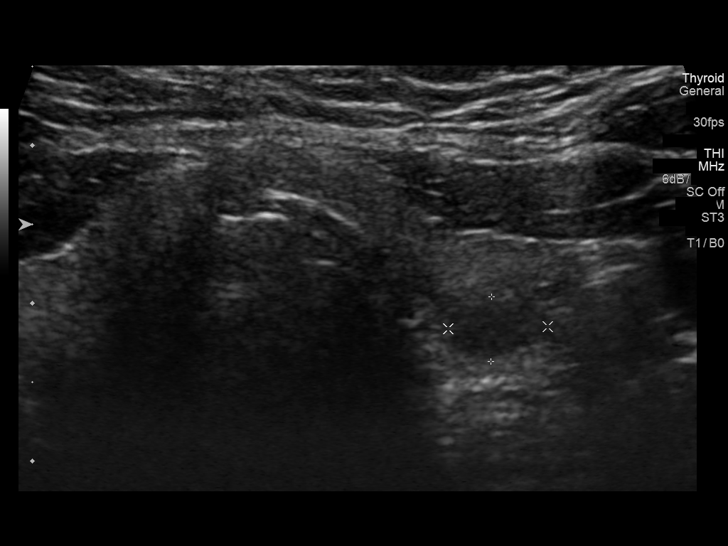
[im 47/54]
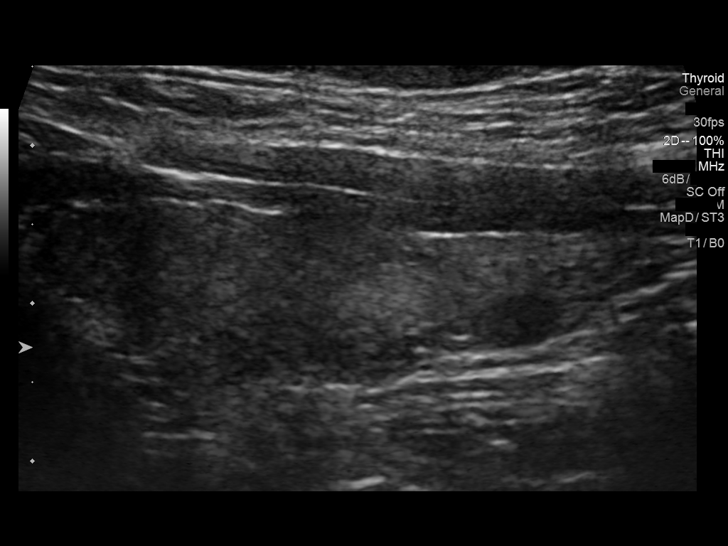
[im 51/54]
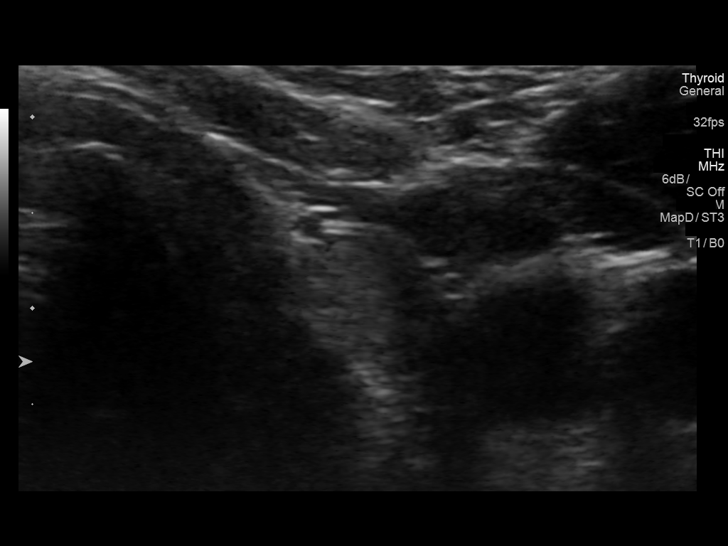

[12 of 25 positions shown; findings below may reference images not displayed]

FINDINGS: Parenchymal Echotexture: Moderately heterogenous

Isthmus: 0.3 cm

Right lobe: 4.2 cm x 1.3 cm x 1.2 cm

Left lobe: 4.0 cm x 1.2 cm x 1.1 cm

_________________________________________________________

Estimated total number of nodules >/= 1 cm: 1

Number of spongiform nodules >/=  2 cm not described below (TR1): 0

Number of mixed cystic and solid nodules >/= 1.5 cm not described
below (TR2): 0

_________________________________________________________

Nodule # 1:

Location: Right; Inferior

Maximum size: 0.7 cm; Other 2 dimensions: 0.4 cm x 0.5 cm

Composition: cannot determine (2)

Echogenicity: hypoechoic (2)

Shape: not taller-than-wide (0)

Margins: ill-defined (0)

Echogenic foci: none (0)

ACR TI-RADS total points: 4.

ACR TI-RADS risk category: TR4 (4-6 points).

ACR TI-RADS recommendations:

Nodule does not meet criteria for surveillance or biopsy

_________________________________________________________

Nodule # 2:

Location: Left; Superior

Maximum size: 0.6 cm; Other 2 dimensions: 0.5 cm x 0.5 cm

Composition: cannot determine (2)

Echogenicity: hypoechoic (2)

Shape: not taller-than-wide (0)

Margins: ill-defined (0)

Echogenic foci: none (0)

ACR TI-RADS total points: 4.

ACR TI-RADS risk category: TR4 (4-6 points).

ACR TI-RADS recommendations:

Nodule does not meet criteria for surveillance or biopsy

_________________________________________________________

Nodule # 3:

Location: Left; Mid

Maximum size: 0.6 cm; Other 2 dimensions: 0.4 cm x 0.5 cm

Composition: cannot determine (2)

Echogenicity: hypoechoic (2)

Shape: not taller-than-wide (0)

Margins: ill-defined (0)

Echogenic foci: none (0)

ACR TI-RADS total points: 4.

ACR TI-RADS risk category: TR4 (4-6 points).

ACR TI-RADS recommendations:

Nodule does not meet criteria for surveillance or biopsy

_________________________________________________________

Nodule # 4:

Location: Left; Mid

Maximum size: 1.5 cm; Other 2 dimensions: 0.6 cm x 0.8 cm

Composition: cannot determine (2)

Echogenicity: isoechoic (1)

Shape: not taller-than-wide (0)

Margins: ill-defined (0)

Echogenic foci: none (0)

ACR TI-RADS total points: 3.

ACR TI-RADS risk category: TR3 (3 points).

ACR TI-RADS recommendations:

Nodule has decreased in size over 5 years, meeting criteria for
benign nodule, with characteristics not meeting criteria for
surveillance or biopsy.

_________________________________________________________

Nodule # 5:

Location: Left; Inferior

Maximum size: 0.5 cm; Other 2 dimensions: 0.4 cm x 0.6 cm

Composition: cannot determine (2)

Echogenicity: hypoechoic (2)

Shape: not taller-than-wide (0)

Margins: ill-defined (0)

Echogenic foci: none (0)

ACR TI-RADS total points: 4.

ACR TI-RADS risk category: TR4 (4-6 points).

ACR TI-RADS recommendations:

Nodule does not meet criteria for surveillance or biopsy

_________________________________________________________

No adenopathy
IMPRESSION: No thyroid nodule meets criteria for biopsy or surveillance, as
designated by the newly established ACR TI-RADS criteria.

Recommendations follow those established by the new ACR TI-RADS
criteria ([HOSPITAL] 1448;[DATE]).

## 2018-07-05 DIAGNOSIS — Z6835 Body mass index (BMI) 35.0-35.9, adult: Secondary | ICD-10-CM | POA: Diagnosis not present

## 2018-07-05 DIAGNOSIS — E669 Obesity, unspecified: Secondary | ICD-10-CM | POA: Diagnosis not present

## 2018-07-05 DIAGNOSIS — E8881 Metabolic syndrome: Secondary | ICD-10-CM | POA: Diagnosis not present

## 2018-08-10 ENCOUNTER — Other Ambulatory Visit: Payer: Self-pay | Admitting: Internal Medicine

## 2018-08-30 ENCOUNTER — Telehealth: Payer: Self-pay | Admitting: Interventional Cardiology

## 2018-08-30 NOTE — Telephone Encounter (Signed)
I spoke to the patient who called because her BP has been fluctuating lately along with episodes of SOB with exertion and minimal swelling.    She has an appt with Brittainy on 7/22 so in the meantime she will be taking her Lasix, recording her BP/HR and update [place] on Friday 7/17.  She denies CP, but if symptoms worsen, she will go to the ED for further evaluation.

## 2018-08-30 NOTE — Telephone Encounter (Signed)
New Message     Patient hasn't been seen since 10/2015 and has an appt scheduled now but wants to report she has been feeling dizzy and lightheaded.  Also her BP has been fluctuating she took it in the morning and it was 153/89 HR 106 then took it again a little later and it was 141/121 HR 142.  Would like to talk to nurse about issue.

## 2018-08-30 NOTE — Telephone Encounter (Signed)
Pt has upcoming appt with Ellen Henri, [place] 7/22.

## 2018-09-03 ENCOUNTER — Telehealth: Payer: Self-pay | Admitting: Interventional Cardiology

## 2018-09-03 NOTE — Telephone Encounter (Signed)
  Pt c/o BP issue: STAT if pt c/o blurred vision, one-sided weakness or slurred speech  1. What are your last 5 BP readings?  7/13 133/82 P 85 7/14 167/88 P 88   131/82 P 84 7/15 155/94 P 81   139/86 P 104   140/86 P 95 7/16 143/82 P 96   153/80 P 92     7/17 151/91 P 97   151/86 P 87     157/83 P 88  2. Are you having any other symptoms (ex. Dizziness, headache, blurred vision, passed out)? NA  3. What is your BP issue?  Patient has had fluid pill for over a year and she doesn't think it is working as well. She would like to know if the Dr wants her to take the phentermine 37.5 MG capsule over the weekend or hold off.

## 2018-09-03 NOTE — Telephone Encounter (Signed)
I spoke to the patient and informed her that we would further evaluate her Bps and medications at her visit with Brittainy on 7/22.  She verbalized understanding and states she is feeling ok, no symptoms.

## 2018-09-07 ENCOUNTER — Telehealth: Payer: Self-pay | Admitting: Cardiology

## 2018-09-07 NOTE — Telephone Encounter (Signed)

## 2018-09-08 ENCOUNTER — Ambulatory Visit (INDEPENDENT_AMBULATORY_CARE_PROVIDER_SITE_OTHER): Payer: 59 | Admitting: Cardiology

## 2018-09-08 ENCOUNTER — Other Ambulatory Visit: Payer: Self-pay

## 2018-09-08 ENCOUNTER — Encounter: Payer: Self-pay | Admitting: Cardiology

## 2018-09-08 VITALS — BP 146/87 | HR 80 | Ht 66.0 in | Wt 207.4 lb

## 2018-09-08 DIAGNOSIS — R0600 Dyspnea, unspecified: Secondary | ICD-10-CM

## 2018-09-08 DIAGNOSIS — Z01812 Encounter for preprocedural laboratory examination: Secondary | ICD-10-CM

## 2018-09-08 DIAGNOSIS — R0789 Other chest pain: Secondary | ICD-10-CM

## 2018-09-08 DIAGNOSIS — Z8249 Family history of ischemic heart disease and other diseases of the circulatory system: Secondary | ICD-10-CM

## 2018-09-08 DIAGNOSIS — E785 Hyperlipidemia, unspecified: Secondary | ICD-10-CM | POA: Diagnosis not present

## 2018-09-08 DIAGNOSIS — R079 Chest pain, unspecified: Secondary | ICD-10-CM

## 2018-09-08 LAB — BASIC METABOLIC PANEL
BUN/Creatinine Ratio: 19 (ref 9–23)
BUN: 17 mg/dL (ref 6–24)
CO2: 25 mmol/L (ref 20–29)
Calcium: 9.4 mg/dL (ref 8.7–10.2)
Chloride: 102 mmol/L (ref 96–106)
Creatinine, Ser: 0.89 mg/dL (ref 0.57–1.00)
GFR calc Af Amer: 83 mL/min/{1.73_m2} (ref >59)
GFR calc non Af Amer: 72 mL/min/{1.73_m2} (ref >59)
Glucose: 105 mg/dL — ABNORMAL HIGH (ref 65–99)
Potassium: 4.5 mmol/L (ref 3.5–5.2)
Sodium: 143 mmol/L (ref 134–144)

## 2018-09-08 LAB — CBC
Hematocrit: 40.4 % (ref 34.0–46.6)
Hemoglobin: 13.6 g/dL (ref 11.1–15.9)
MCH: 29.2 pg (ref 26.6–33.0)
MCHC: 33.7 g/dL (ref 31.5–35.7)
MCV: 87 fL (ref 79–97)
Platelets: 307 10*3/uL (ref 150–450)
RBC: 4.65 x10E6/uL (ref 3.77–5.28)
RDW: 11.9 % (ref 11.7–15.4)
WBC: 8.1 10*3/uL (ref 3.4–10.8)

## 2018-09-08 MED ORDER — METOPROLOL TARTRATE 25 MG PO TABS: 25.0000 mg | ORAL_TABLET | Freq: Two times a day (BID) | ORAL | 3 refills | Status: DC

## 2018-09-08 MED ORDER — FUROSEMIDE 20 MG PO TABS: 20.0000 mg | ORAL_TABLET | Freq: Every day | ORAL | 3 refills | Status: DC | PRN

## 2018-09-08 MED ORDER — METOPROLOL SUCCINATE ER 25 MG PO TB24: 25.0000 mg | ORAL_TABLET | Freq: Every day | ORAL | 3 refills | Status: DC

## 2018-09-08 NOTE — Patient Instructions (Addendum)
Medication Instructions:  START Metoprolol Tartrate 25 mg Twice Daily If you need a refill on your cardiac medications before your next appointment, please call your pharmacy.   Lab work:TODAY CBC BMET If you have labs (blood work) drawn today and your tests are completely normal, you will receive your results only by: Marland Kitchen MyChart Message (if you have MyChart) OR . A paper copy in the mail If you have any lab test that is abnormal or we need to change your treatment, we will call you to review the results.  Testing/Procedures: Your physician has requested that you have an echocardiogram. Echocardiography is a painless test that uses sound waves to create images of your heart. It provides your doctor with information about the size and shape of your heart and how well your heart's chambers and valves are working. This procedure takes approximately one hour. There are no restrictions for this procedure.  Coronary CT  (PLEASE SCHEDULE)  Referral to [facility]  (PLEASE SCHEDULE)  Follow-Up: 4 weeks with Lyda Jester, PA At [facility], you and your health needs are our priority.  As part of our continuing mission to provide you with exceptional heart care, we have created designated Provider Care Teams.  These Care Teams include your primary Cardiologist (physician) and Advanced Practice Providers (APPs -  Physician Assistants and Nurse Practitioners) who all work together to provide you with the care you need, when you need it. .   Any Other Special Instructions Will Be Listed Below (If Applicable). Please arrive at the [facility] main entrance of [facility] at ______AM (30-45 minutes prior to test start time)  [facility] [place], [place] [address] [phone]  Proceed to the [facility] Radiology Department (First Floor).  Please follow these instructions carefully (unless otherwise directed):   On the Night Before the  Test: . Be sure to Drink plenty of water. . Do not consume any caffeinated/decaffeinated beverages or chocolate 12 hours prior to your test. . Do not take any antihistamines 12 hours prior to your test. On the Day of the Test: . Drink plenty of water. Do not drink any water within one hour of the test. . Do not eat any food 4 hours prior to the test. . You may take your regular medications prior to the test.  . Take Lopressor 100 mg x 1 (2 hours prior) . HOLD Furosemide/Hydrochlorothiazide morning of the test.         -     After the Test: . Drink plenty of water. . After receiving IV contrast, you may experience a mild flushed feeling. This is normal. . On occasion, you may experience a mild rash up to 24 hours after the test. This is not dangerous. If this occurs, you can take Benadryl 25 mg and increase your fluid intake. . If you experience trouble breathing, this can be serious. If it is severe call 911 IMMEDIATELY. If it is mild, please call our office. . If you take any of these medications: Glipizide/Metformin, Avandament, Glucavance, please do not take 48 hours after completing test.

## 2018-09-08 NOTE — Progress Notes (Signed)
Patient ID: Tracey Rodriguez                 DOB: 10/08/1960                    MRN: [id number]     HPI: Tracey Rodriguez is a 58 y.o. female patient of Dr. Irish Lack, referred to lipid clinic by Lyda Jester, PA. PMH is significant for COPD, GERD, hypothyroidism, osteoarthritis, factor V deficiency, breast cancer, prediabetes, HTN, and HLD. Pt has had a work-up previously for chest discomfort, dyspnea, and a cardiac work up (negative monitor and negative nuclear stress test). Pt has not seen Dr. Irish Lack in ~3 years, however, reached out to [facility] clinic 08/2018 due to noticing elevated BP readings upon initiation of BP monitoring. Brittainy Simmons referred pt considering most recent LDL was severely elevated at 193 mg/dL in 12/22/2017 AT 12/22/17. ASCVD 10 year risk is 10% and ASCVD lifetime risk is 50%; suspect these % actually may be higher when considering strong familial history of premature CAD.  Pt presents today in good spirits. When asked about prior statin intolerance, pt mentioned she had such severe pruritus associated with statins that she had to "use a hair brush on her body" to help resolve itching. Pt is open to trying a medication to help control her lipids and reduce ASCVD risk, however, is hesitant regarding an injectable medication. Also, she states she does not want to try any medication with a side effect that will worsen dry eyes, osteoporosis, cause weight gain, or affect her liver. Pt mentions in the past she has had abnormal liver levels. She is scheduled for a liver biopsy on Monday September 13, 2018.  Pt states she overall tries to eat healthy and is currently seeing a provider in [place] to help manage her diet/decrease fat. She is taking phentermine to help with her weight. She exercises when possible through walking, however, she has severe pain that limits her from doing so as frequently as she would like, especially in the winter.  Pt mentions that husband was laid  off ~3 months ago and that pt uses other expensive medications (>$14,000 for urinary and GERD medications) so cost is an important factor in her decision.  Current Medications: No HLD therapy currently Intolerances: atorvastatin, simvastatin 20mg , rosuvastatin (intolerable itching), red yeast rice (no improvement)  Risk Factors: HTN, HLD, smoker, obese (BMI 33.48), strong family history of premature CAD, prediabetes LDL goal: <100 mg/dL Non-HDL goal: <130 mg/dL  Diet: Protein drink in the morning, tuna, chicken, fish, occasional steak, salad, quinoa/rice  Exercise: None currently, sometimes walks  Family History: father's side of the family (premature CAD), father (perioperative MI (leg amputation); age 50), brother (2 Mis; ages 27 and 72), paternal uncle (MI; age 92)  Social History: former smoker (quit 02/2018; 66 pack years history), rarely drinks alcohol  Labs: 12/22/17 ([facility]) TC 282, HDL 70, TG 97, LDL 193, NON-HDL 212; no therapies  Past Medical History:  Diagnosis Date  . Arthritis    deg disc disease  - lower back, neck  . Cancer ([place])    left breast surgery-lumpectomy  . Disorder of vocal cord   . Factor V deficiency ([place])   . Fibromyalgia   . GERD (gastroesophageal reflux disease)   . Heart murmur   . Hx of adenomatous polyp of colon 02/04/2018  . Hyperlipidemia   . Hypothyroidism    goiter  . Neuromuscular disorder ([place])    sciatic nerve  .  PONV (postoperative nausea and vomiting)   . Rectocele 01/27/2018  . SVD (spontaneous vaginal delivery)    x 2    Current Outpatient Medications on File Prior to Visit  Medication Sig Dispense Refill  . albuterol (PROVENTIL HFA;VENTOLIN HFA) 108 (90 BASE) MCG/ACT inhaler Inhale 2 puffs into the lungs every 6 (six) hours as needed for wheezing or shortness of breath.    . cetirizine (ZYRTEC) 10 MG tablet Take 10 mg by mouth daily.    . diclofenac sodium (VOLTAREN) 1 % GEL Apply 1 application topically daily as needed  (arthritis).    . Doxycycline Hyclate (DORYX) 200 MG TBEC Take 200 mg by mouth daily as needed (skin inflammation).    Marland Kitchen doxylamine, Sleep, (UNISOM) 25 MG tablet Take 25 mg by mouth at bedtime as needed.    Marland Kitchen EPINEPHrine (EPIPEN JR) 0.15 MG/0.3ML injection epinephrine (Jr) 0.15 mg/0.3 mL injection,auto-injector  INJECT AS DIRECTED    . fluocinonide ointment (LIDEX) 5.00 % Apply 1 application topically as needed.     . furosemide (LASIX) 20 MG tablet Take 1 tablet (20 mg total) by mouth daily as needed for fluid or edema. 90 tablet 3  . hydrocortisone (ANUSOL-HC) 2.5 % rectal cream Place 1 application rectally 2 (two) times daily. 30 g 1  . ipratropium-albuterol (DUONEB) 0.5-2.5 (3) MG/3ML SOLN As needed    . levothyroxine (SYNTHROID, LEVOTHROID) 50 MCG tablet Take 50 mcg by mouth daily.    . methocarbamol (ROBAXIN) 500 MG tablet Take 1 tablet (500 mg total) by mouth 2 (two) times daily as needed for muscle spasms. 30 tablet 0  . metoprolol tartrate (LOPRESSOR) 25 MG tablet Take 1 tablet (25 mg total) by mouth 2 (two) times daily. 180 tablet 3  . mirabegron ER (MYRBETRIQ) 50 MG TB24 Take 50 mg by mouth daily.    . Multiple Vitamins-Minerals (MULTIVITAMIN PO) Take by mouth daily.    . mupirocin ointment (BACTROBAN) 2 % Place 1 application into the nose daily as needed (blood blisters).    Marland Kitchen NEXIUM 40 MG capsule TAKE ONE CAPSULE BY MOUTH TWICE DAILY BEFORE MEALS 180 capsule 1  . OVER THE COUNTER MEDICATION Take 2 capsules by mouth daily. Med Name: STRESS B COMPLEX CAPSULE    . phentermine 37.5 MG capsule Take 18.25 mg by mouth every morning.    Marland Kitchen scopolamine (TRANSDERM-SCOP, 1.5 MG,) 1 MG/3DAYS Transderm-Scop 1.5 mg transdermal patch (1 mg over 3 days)    . TURMERIC PO Take by mouth daily.     No current facility-administered medications on file prior to visit.     Allergies  Allergen Reactions  . Capsaicin Anaphylaxis    Pt not sure of name   . Etodolac Shortness Of Breath    REACTION:  dyspnea/tightness in chest  . Bee Venom   . Crestor [Rosuvastatin] Itching    All statins   . Doxycycline Nausea And Vomiting  . Pepto-Bismol [Bismuth] Nausea And Vomiting  . Pork-Derived Products Nausea And Vomiting  . Sulfonamide Derivatives Nausea And Vomiting    REACTION: vomiting    Assessment/Plan:  1. Hyperlipidemia - LDL goal: <100 mg/dL. Non-HDL goal: <130 mg/dL. Pt is significantly above goal with prior LDL reading 193 (12/2017). Educated pt on current available options to lower LDL, which are bempedoic acid (dec ~20%), ezetimibe (dec ~20%), combo bempedoic acid/ezetimibe (dec ~40%), and PCSK9 inhibitors (dec 60%). Thoroughly discussed the [place], potential AE, administration, and insurance coverage of each medications. Pt would prefer to research medications and information  provided by manufacturers before making a decision to initiate HLD pharmacotherapy. Provided pt with clinic number if she would like to discuss options with pharmacist after performing research. Plan to f/u with pt in 2 weeks if we have not heard from her.   Thank you for involving pharmacy to assist with providing Mrs. Katayama's care.  Drexel Iha, PharmD PGY2 Ambulatory Care Pharmacy Resident

## 2018-09-08 NOTE — Progress Notes (Signed)
09/08/2018 Tracey Rodriguez   1960/07/18  [id number]  Primary Physician Aura Dials, MD Primary Cardiologist: Dr. Irish Lack Electrophysiologist: None   Reason for Visit/CC: Exertional dyspnea, palpitations and fluctuating BP  HPI:  Tracey Rodriguez is a 58 y.o. female with untreated hypertension and hyperlipidemia and family history of premature coronary artery disease who is being evaluated today for dyspnea, palpitations and high blood pressure.  She has been seen once by Dr.Varanasi.  Her last visit was in September 2017.  Per records, she was also previously seen by Dr. Einar Gip in the past.  She has had work-up previously for chest discomfort and dyspnea.  She had a negative cardiac work-up including essentially negative monitor and negative nuclear stress test.  She has a family history of premature coronary artery disease on her father's side of the family.  Her father had a perioperative myocardial infarction during a leg amputation at the age of 47.  Her brother has had 2 myocardial infarctions, his first at 73 and a second at the age of 60.  Her paternal uncle also had a myocardial infarction at the age of 21.  She has untreated hyperlipidemia.  She has been unable to tolerate statins due to side effects. Nearly every statin tried has caused intolerable itching.  She has also tried red yeast rice without any significant improvement in LDL.  Her last lipid panel was done December 22, 2017.  LDL was severely elevated at 193 mg/dL.  She denies history of tobacco use.  As noted above, the patient has not been seen by our practice in almost 3 years.  She contacted our office on July 13 complaining of fluctuating blood pressure and episodes of shortness of breath with exertion and minimal swelling.  She was placed on my schedule for evaluation.  Patient reports that she first noticed symptoms on Mother's Day.  She was outside playing a game of badminton with family.  She reports that she was  playing lightly and was not exerting herself heavily.  She had experienced tachypalpitations and dyspnea and had to stop and rest several times.  Just last week, she was outside doing yard work and had a similar experience where she had tachypalpitations and dyspnea and also had some associated jaw pain.  Since that time, she has been checking her blood pressure closely at home.  She just recently bought a brand-new blood pressure monitor and has had home readings in the 284X systolic.  She reports that she has been under a great deal of stress recently.  Her brother has special needs and is in a group home and has been having a difficult time with the social restrictions due to COVID-19.  Her husband was also recently furloughed from his job.  She is currently asymptomatic in clinic today.  Her blood pressure is high in the 324M systolic.  EKG shows normal sinus rhythm.  No ST abnormalities.  Heart rate is 76 bpm.  She is currently not on any antihypertensive medications.   Cardiac Studies   Not available for review   Current Meds  Medication Sig  . albuterol (PROVENTIL HFA;VENTOLIN HFA) 108 (90 BASE) MCG/ACT inhaler Inhale 2 puffs into the lungs every 6 (six) hours as needed for wheezing or shortness of breath.  . cetirizine (ZYRTEC) 10 MG tablet Take 10 mg by mouth daily.  . diclofenac sodium (VOLTAREN) 1 % GEL Apply 1 application topically daily as needed (arthritis).  . Doxycycline Hyclate (DORYX) 200 MG TBEC Take 200  mg by mouth daily as needed (skin inflammation).  Marland Kitchen doxylamine, Sleep, (UNISOM) 25 MG tablet Take 25 mg by mouth at bedtime as needed.  Marland Kitchen EPINEPHrine (EPIPEN JR) 0.15 MG/0.3ML injection epinephrine (Jr) 0.15 mg/0.3 mL injection,auto-injector  INJECT AS DIRECTED  . fluocinonide ointment (LIDEX) 4.40 % Apply 1 application topically as needed.   . furosemide (LASIX) 20 MG tablet Take 1 tablet (20 mg total) by mouth daily as needed for fluid or edema.  . hydrocortisone  (ANUSOL-HC) 2.5 % rectal cream Place 1 application rectally 2 (two) times daily.  Marland Kitchen ipratropium-albuterol (DUONEB) 0.5-2.5 (3) MG/3ML SOLN As needed  . levothyroxine (SYNTHROID, LEVOTHROID) 50 MCG tablet Take 50 mcg by mouth daily.  . methocarbamol (ROBAXIN) 500 MG tablet Take 1 tablet (500 mg total) by mouth 2 (two) times daily as needed for muscle spasms.  . mirabegron ER (MYRBETRIQ) 50 MG TB24 Take 50 mg by mouth daily.  . Multiple Vitamins-Minerals (MULTIVITAMIN PO) Take by mouth daily.  . mupirocin ointment (BACTROBAN) 2 % Place 1 application into the nose daily as needed (blood blisters).  Marland Kitchen NEXIUM 40 MG capsule TAKE ONE CAPSULE BY MOUTH TWICE DAILY BEFORE MEALS  . OVER THE COUNTER MEDICATION Take 2 capsules by mouth daily. Med Name: STRESS B COMPLEX CAPSULE  . phentermine 37.5 MG capsule Take 18.25 mg by mouth every morning.  Marland Kitchen scopolamine (TRANSDERM-SCOP, 1.5 MG,) 1 MG/3DAYS Transderm-Scop 1.5 mg transdermal patch (1 mg over 3 days)  . TURMERIC PO Take by mouth daily.  . [DISCONTINUED] furosemide (LASIX) 20 MG tablet Take 20 mg by mouth daily as needed for fluid or edema.   Allergies  Allergen Reactions  . Capsaicin Anaphylaxis    Pt not sure of name   . Etodolac Shortness Of Breath    REACTION: dyspnea/tightness in chest  . Bee Venom   . Crestor [Rosuvastatin] Itching    All statins   . Doxycycline Nausea And Vomiting  . Pepto-Bismol [Bismuth] Nausea And Vomiting  . Pork-Derived Products Nausea And Vomiting  . Sulfonamide Derivatives Nausea And Vomiting    REACTION: vomiting   Past Medical History:  Diagnosis Date  . Arthritis    deg disc disease  - lower back, neck  . Cancer ([place])    left breast surgery-lumpectomy  . Disorder of vocal cord   . Factor V deficiency ([place])   . Fibromyalgia   . GERD (gastroesophageal reflux disease)   . Heart murmur   . Hx of adenomatous polyp of colon 02/04/2018  . Hyperlipidemia   . Hypothyroidism    goiter  . Neuromuscular disorder  ([place])    sciatic nerve  . PONV (postoperative nausea and vomiting)   . Rectocele 01/27/2018  . SVD (spontaneous vaginal delivery)    x 2   Family History  Problem Relation Age of Onset  . Heart disease Father 23  . Heart attack Father   . Clotting disorder Father   . Rheumatologic disease Father   . Crohn's disease Father   . Heart disease Maternal Grandfather   . Lung cancer Maternal Grandfather   . Breast cancer Mother 26  . Ovarian cancer Mother   . Liver cancer Maternal Grandmother   . Heart attack Brother   . Cancer Brother   . Lung cancer Brother   . Colon polyps Brother   . Colon polyps Brother   . Breast cancer Maternal Aunt 60  . Kidney disease Paternal Uncle   . Irritable bowel syndrome Son    Past  Surgical History:  Procedure Laterality Date  . ABLATION    . APPENDECTOMY    . BREAST EXCISIONAL BIOPSY Left   . BREAST SURGERY     left- lumpectomy  . DILATATION & CURRETTAGE/HYSTEROSCOPY WITH RESECTOCOPE N/A 04/05/2012   Procedure: hysteroscopy with endocervical curretting;  Surgeon: Allyn Kenner, DO;  Location: [facility];  Service: Gynecology;  Laterality: N/A;  . DILATION AND CURETTAGE OF UTERUS    . EYE SURGERY Bilateral 10/2017   cataract extraction   . NOSE SURGERY     Social History   Socioeconomic History  . Marital status: Married    Spouse name: Not on file  . Number of children: 2  . Years of education: Not on file  . Highest education level: Not on file  Occupational History  . Not on file  Social Needs  . Financial resource strain: Not on file  . Food insecurity    Worry: Not on file    Inability: Not on file  . Transportation needs    Medical: Not on file    Non-medical: Not on file  Tobacco Use  . Smoking status: Former Smoker    Packs/day: 3.00    Years: 22.00    Pack years: 66.00    Types: Cigarettes    Quit date: 02/18/2000    Years since quitting: 18.5  . Smokeless tobacco: Never Used  Substance and Sexual Activity  . Alcohol  use: Yes    Alcohol/week: 0.0 standard drinks    Comment: RARELY  . Drug use: Never  . Sexual activity: Yes    Birth control/protection: Post-menopausal  Lifestyle  . Physical activity    Days per week: Not on file    Minutes per session: Not on file  . Stress: Not on file  Relationships  . Social Herbalist on phone: Not on file    Gets together: Not on file    Attends religious service: Not on file    Active member of club or organization: Not on file    Attends meetings of clubs or organizations: Not on file    Relationship status: Not on file  . Intimate partner violence    Fear of current or ex partner: Not on file    Emotionally abused: Not on file    Physically abused: Not on file    Forced sexual activity: Not on file  Other Topics Concern  . Not on file  Social History Narrative  . Not on file     Lipid Panel  No results found for: CHOL, TRIG, HDL, CHOLHDL, VLDL, LDLCALC, LDLDIRECT  Review of Systems: General: negative for chills, fever, night sweats or weight changes.  Cardiovascular: negative for chest pain, dyspnea on exertion, edema, orthopnea, palpitations, paroxysmal nocturnal dyspnea or shortness of breath Dermatological: negative for rash Respiratory: negative for cough or wheezing Urologic: negative for hematuria Abdominal: negative for nausea, vomiting, diarrhea, bright red blood per rectum, melena, or hematemesis Neurologic: negative for visual changes, syncope, or dizziness All other systems reviewed and are otherwise negative except as noted above.   Physical Exam:  Blood pressure (!) 146/87, pulse 80, height 5\' 6"  (1.676 m), weight 207 lb 6.4 oz (94.1 kg), SpO2 98 %.  General appearance: alert, cooperative and no distress Neck: no carotid bruit and no JVD Lungs: clear to auscultation bilaterally Heart: regular rate and rhythm, S1, S2 normal, no murmur, click, rub or gallop Extremities: extremities normal, atraumatic, no cyanosis or  edema Pulses: 2+  and symmetric Skin: Skin color, texture, turgor normal. No rashes or lesions Neurologic: Grossly normal  EKG NSR no ischemic abnormalties  -- personally reviewed   ASSESSMENT AND PLAN:   Given the above symptoms outlined in HPI as well as her multiple cardiac risk factors, including a strong family h/o premature CAD in first degree relatives, untreated hypertension and severely elevated LDL with inability to tolerate statins, we will plan for cardiac evaluation to exclude coronary artery disease.  We will start her on beta-blocker therapy with metoprolol (Lopressor) 25 mg twice daily.  I will refer her to our lipid clinic for consideration for PCSK9 inhibitors as her most recent LDL was severely elevated at 193 mg/dL.  I will also order a coronary CTA with morphology to exclude underlying coronary artery disease as well as a 2D echocardiogram to assess LV function and screen for valvular heart disease.  We will plan to follow-up in 3 to 4 weeks, after studies have been completed.   Follow-Up 3-4 weeks.   Alisea Matte Ladoris Gene, MHS [facility] 09/08/2018 12:54 PM

## 2018-09-09 ENCOUNTER — Ambulatory Visit (HOSPITAL_COMMUNITY): Payer: 59 | Attending: Cardiology

## 2018-09-09 ENCOUNTER — Ambulatory Visit (INDEPENDENT_AMBULATORY_CARE_PROVIDER_SITE_OTHER): Payer: 59 | Admitting: Pharmacist

## 2018-09-09 DIAGNOSIS — E785 Hyperlipidemia, unspecified: Secondary | ICD-10-CM

## 2018-09-09 DIAGNOSIS — R7303 Prediabetes: Secondary | ICD-10-CM | POA: Insufficient documentation

## 2018-09-09 DIAGNOSIS — E782 Mixed hyperlipidemia: Secondary | ICD-10-CM | POA: Diagnosis not present

## 2018-09-09 DIAGNOSIS — R0789 Other chest pain: Secondary | ICD-10-CM | POA: Insufficient documentation

## 2018-09-09 DIAGNOSIS — R0600 Dyspnea, unspecified: Secondary | ICD-10-CM | POA: Diagnosis present

## 2018-09-09 DIAGNOSIS — R079 Chest pain, unspecified: Secondary | ICD-10-CM

## 2018-09-09 NOTE — Patient Instructions (Addendum)
Praluent (Regeneron) or Repatha (Amgen) - 1 shot subcutaneously every 14 days. Lowers LDL cholesterol by 60%  Zetia (ezetimibe) - this is generic, 1 pill once a day. Lowers LDL about 20%  Nexletol (Esperion) - 1 pill once a day. Lowers LDL about 20%  Nexlizet (combination of Zetia and Nexletol) - 1 pill once a day. Lowers LDL 40%  Your LDL is currently 193 and we would like to lower it to < 100 to help prevent a heart attack or stroke  Call Jinny Blossom, Pharmacist if you would like to start either of these medications [phone]

## 2018-09-10 ENCOUNTER — Telehealth: Payer: Self-pay

## 2018-09-10 NOTE — Telephone Encounter (Signed)
-----   Message from Consuelo Pandy, [place] sent at 09/10/2018 12:24 PM EDT ----- Heart ultrasound looks fine. The overall pumping function of the heart is normal, but there is mild impairment with relaxation of the heart after each squeeze. This is commonly seen in patient's with high blood pressure. We will just need to continue to work on getting BP controlled. No valvular disease.

## 2018-09-10 NOTE — Telephone Encounter (Signed)
Notes recorded by Frederik Schmidt, RN on 09/10/2018 at 12:43 PM EDT  The patient has been notified of the result and verbalized understanding. All questions (if any) were answered.  Frederik Schmidt, RN 09/10/2018 12:43 PM

## 2018-09-15 ENCOUNTER — Telehealth (HOSPITAL_COMMUNITY): Payer: Self-pay | Admitting: Emergency Medicine

## 2018-09-15 NOTE — Telephone Encounter (Signed)
Left message on voicemail with name and callback number Daveion Robar RN Navigator Cardiac Imaging [facility] [phone] Office [phone] Cell  

## 2018-09-16 ENCOUNTER — Encounter (HOSPITAL_COMMUNITY): Payer: Self-pay

## 2018-09-16 ENCOUNTER — Ambulatory Visit (HOSPITAL_COMMUNITY)
Admission: RE | Admit: 2018-09-16 | Discharge: 2018-09-16 | Disposition: A | Discharge status: Home / Self Care | Payer: 59 | Source: Ambulatory Visit | Attending: Cardiology | Admitting: Cardiology

## 2018-09-16 ENCOUNTER — Other Ambulatory Visit: Payer: Self-pay

## 2018-09-16 ENCOUNTER — Encounter: Payer: 59 | Admitting: *Deleted

## 2018-09-16 DIAGNOSIS — R0789 Other chest pain: Secondary | ICD-10-CM

## 2018-09-16 DIAGNOSIS — E785 Hyperlipidemia, unspecified: Secondary | ICD-10-CM

## 2018-09-16 DIAGNOSIS — R079 Chest pain, unspecified: Secondary | ICD-10-CM

## 2018-09-16 DIAGNOSIS — R0600 Dyspnea, unspecified: Secondary | ICD-10-CM

## 2018-09-16 DIAGNOSIS — Z006 Encounter for examination for normal comparison and control in clinical research program: Secondary | ICD-10-CM

## 2018-09-16 MED ORDER — NITROGLYCERIN 0.4 MG SL SUBL
SUBLINGUAL_TABLET | SUBLINGUAL | Status: AC
  Filled 2018-09-16: qty 2

## 2018-09-16 MED ORDER — IOHEXOL 350 MG/ML SOLN
80.0000 mL | Freq: Once | INTRAVENOUS | Status: AC | PRN
  Administered 2018-09-16: 15:00:00 80 mL via INTRAVENOUS

## 2018-09-16 MED ORDER — NITROGLYCERIN 0.4 MG SL SUBL
0.8000 mg | SUBLINGUAL_TABLET | Freq: Once | SUBLINGUAL | Status: AC
  Administered 2018-09-16: 0.8 mg via SUBLINGUAL
  Filled 2018-09-16: qty 25

## 2018-09-16 NOTE — Research (Signed)
CADFEM Informed Consent   Subject Name: Tracey Rodriguez  Subject met inclusion and exclusion criteria.  The informed consent form, study requirements and expectations were reviewed with the subject and questions and concerns were addressed prior to the signing of the consent form.  The subject verbalized understanding of the trial requirements.  The subject agreed to participate in the CADFEM trial and signed the informed consent at 1305 on 30/jul/2020.  The informed consent was obtained prior to performance of any protocol-specific procedures for the subject.  A copy of the signed informed consent was given to the subject and a copy was placed in the subject's medical record.   Hedrick,Mattheus Rauls W

## 2018-09-18 HISTORY — PX: LIVER BIOPSY: SHX301

## 2018-09-21 ENCOUNTER — Telehealth: Payer: Self-pay

## 2018-09-21 NOTE — Telephone Encounter (Signed)
-----   Message from Consuelo Pandy, [place] sent at 09/20/2018  5:54 PM EDT ----- 1. Coronary artery calcium score 0 Agatston units. This suggests low risk for future cardiac events.  2.  No significant coronary disease noted.   This is great! No plaque or blockages!!

## 2018-09-21 NOTE — Telephone Encounter (Signed)
Notes recorded by Frederik Schmidt, RN on 09/21/2018 at 9:28 AM EDT  The patient has been notified of the result and verbalized understanding. All questions (if any) were answered.  Frederik Schmidt, RN 09/21/2018 9:28 AM

## 2018-10-05 ENCOUNTER — Other Ambulatory Visit: Payer: Self-pay | Admitting: Cardiology

## 2018-10-05 ENCOUNTER — Other Ambulatory Visit: Payer: Self-pay

## 2018-10-05 ENCOUNTER — Ambulatory Visit (INDEPENDENT_AMBULATORY_CARE_PROVIDER_SITE_OTHER): Payer: 59 | Admitting: Cardiology

## 2018-10-05 ENCOUNTER — Encounter: Payer: Self-pay | Admitting: Cardiology

## 2018-10-05 VITALS — BP 122/74 | HR 72 | Ht 66.0 in | Wt 202.0 lb

## 2018-10-05 DIAGNOSIS — I739 Peripheral vascular disease, unspecified: Secondary | ICD-10-CM

## 2018-10-05 DIAGNOSIS — I10 Essential (primary) hypertension: Secondary | ICD-10-CM

## 2018-10-05 DIAGNOSIS — R0989 Other specified symptoms and signs involving the circulatory and respiratory systems: Secondary | ICD-10-CM | POA: Diagnosis not present

## 2018-10-05 DIAGNOSIS — R42 Dizziness and giddiness: Secondary | ICD-10-CM | POA: Diagnosis not present

## 2018-10-05 MED ORDER — METOPROLOL SUCCINATE ER 50 MG PO TB24: 50.0000 mg | ORAL_TABLET | Freq: Every day | ORAL | 3 refills | Status: DC

## 2018-10-05 NOTE — Patient Instructions (Addendum)
Medication Instructions:  STOP METOPROLOL TARTRATE (LOPRESSOR)  START METOPROLOL SUCCINATE (TOPROL) 50 mg Daily If you need a refill on your cardiac medications before your next appointment, please call your pharmacy.   Lab work: none If you have labs (blood work) drawn today and your tests are completely normal, you will receive your results only by: Marland Kitchen MyChart Message (if you have MyChart) OR . A paper copy in the mail If you have any lab test that is abnormal or we need to change your treatment, we will call you to review the results.  Testing/Procedures:  PLEASE SCHEDULE Your physician has requested that you have a carotid duplex. This test is an ultrasound of the carotid arteries in your neck. It looks at blood flow through these arteries that supply the brain with blood. Allow one hour for this exam. There are no restrictions or special instructions.  Your physician has requested that you have a lower extremity arterial duplex. This test is an ultrasound of the arteries in the legs or arms. It looks at arterial blood flow in the legs and arms. Allow one hour for Lower and Upper Arterial scans. There are no restrictions or special instructions   Follow-Up: At [facility], you and your health needs are our priority.  As part of our continuing mission to provide you with exceptional heart care, we have created designated Provider Care Teams.  These Care Teams include your primary Cardiologist (physician) and Advanced Practice Providers (APPs -  Physician Assistants and Nurse Practitioners) who all work together to provide you with the care you need, when you need it. You will need a follow up appointment in 6 months.  Please call our office 2 months in advance to schedule this appointment.  You may see Dr Irish Lack or one of the following Advanced Practice Providers on your designated Care Team:   Lyda Jester, PA-C Melina Copa, PA-C . Ermalinda Barrios, PA-C  Any Other Special  Instructions Will Be Listed Below (If Applicable).

## 2018-10-05 NOTE — Progress Notes (Signed)
10/05/2018 Tracey Rodriguez   05-Jan-1961  [id number]  Primary Physician Aura Dials, MD Primary Cardiologist: Dr. Irish Lack Electrophysiologist: None   Reason for Visit/CC: F/u for CP and HTN   HPI:   Tracey Rodriguez is a 58 y.o. female with prior h/o untreated hypertension and hyperlipidemia and family history of premature coronary artery disease who presents for f/u.  She presented to clinic on 7/22 for evaluation for dyspnea, palpitations and high blood pressure. I added metoprolol 25 mg BID, referred her to Lipid Clinic (due to history of statin intolerance - intolerable itching with almost every statin tried) and ordered an echocardiogram and coronary CTA w/ calcium.   Her 2D echo showed normal LVEF with mild diastolic dysfunction. No valvular disease. Coronary CTA showed no CAD and calcium score of 0.   She was seen in the Lipid clinic. They offered PCSK9i vs Zetia vs bempedoic acid but pt declined. She ultimately followed up again with her PCP and has since restarted medium dose Lipitor every other day and is tolerating ok. Plans to have f/u lipid panel in 1 month.  Her BP is better controlled w/ metoprolol. 122/74 today. HR 72. No side effects. She sometimes forgets however to take her evening dose.   She also has concerns regarding PVD. She reports nocturnal leg pain and discoloration of her toes. Sometimes turning "blue" in appearance. She has palpable but weak DPs bilaterally. Also right sided bruit on exam. Some occasional dizziness but no syncope/ near syncope.    Cardiac Studies  Coronary CTA 09/16/18  FINDINGS: Non-cardiac: See separate report from [facility].  Pulmonary veins drain normally to the left atrium.  Calcium Score: 0 Agatston units.  Coronary Arteries: Right dominant with no anomalies  LM: Short, no plaque or stenosis.  LAD system:  No plaque or stenosis.  Circumflex system: No plaque or stenosis. Misregistration artifact noted  in mid LCx.  RCA system: No plaque or stenosis.  IMPRESSION: 1. Coronary artery calcium score 0 Agatston units. This suggests low risk for future cardiac events.  2.  No significant coronary disease noted.  Tracey Rodriguez 2D Echo 09/09/18 IMPRESSIONS    1. The left ventricle has hyperdynamic systolic function, with an ejection fraction of >65%. The cavity size was normal. Left ventricular diastolic Doppler parameters are consistent with impaired relaxation. No evidence of left ventricular regional wall  motion abnormalities.  2. The right ventricle has normal systolic function. The cavity was normal. There is no increase in right ventricular wall thickness. Right ventricular systolic pressure could not be assessed.  3. The aorta is normal in size and structure.   No outpatient medications have been marked as taking for the 10/05/18 encounter (Office Visit) with Consuelo Pandy, PA-C.   Allergies  Allergen Reactions  . Capsaicin Anaphylaxis    Pt not sure of name   . Etodolac Shortness Of Breath    REACTION: dyspnea/tightness in chest  . Bee Venom   . Crestor [Rosuvastatin] Itching    All statins   . Doxycycline Nausea And Vomiting  . Pepto-Bismol [Bismuth] Nausea And Vomiting  . Pork-Derived Products Nausea And Vomiting  . Sulfonamide Derivatives Nausea And Vomiting    REACTION: vomiting   Past Medical History:  Diagnosis Date  . Arthritis    deg disc disease  - lower back, neck  . Cancer ([place])    left breast surgery-lumpectomy  . Disorder of vocal cord   . Factor V deficiency ([place])   . Fibromyalgia   .  GERD (gastroesophageal reflux disease)   . Heart murmur   . Hx of adenomatous polyp of colon 02/04/2018  . Hyperlipidemia   . Hypothyroidism    goiter  . Neuromuscular disorder ([place])    sciatic nerve  . PONV (postoperative nausea and vomiting)   . Rectocele 01/27/2018  . SVD (spontaneous vaginal delivery)    x 2   Family History  Problem Relation  Age of Onset  . Heart disease Father 38  . Heart attack Father   . Clotting disorder Father   . Rheumatologic disease Father   . Crohn's disease Father   . Heart disease Maternal Grandfather   . Lung cancer Maternal Grandfather   . Breast cancer Mother 51  . Ovarian cancer Mother   . Liver cancer Maternal Grandmother   . Heart attack Brother   . Cancer Brother   . Lung cancer Brother   . Colon polyps Brother   . Colon polyps Brother   . Breast cancer Maternal Aunt 60  . Kidney disease Paternal Uncle   . Irritable bowel syndrome Son    Past Surgical History:  Procedure Laterality Date  . ABLATION    . APPENDECTOMY    . BREAST EXCISIONAL BIOPSY Left   . BREAST SURGERY     left- lumpectomy  . DILATATION & CURRETTAGE/HYSTEROSCOPY WITH RESECTOCOPE N/A 04/05/2012   Procedure: hysteroscopy with endocervical curretting;  Surgeon: Allyn Kenner, DO;  Location: [facility];  Service: Gynecology;  Laterality: N/A;  . DILATION AND CURETTAGE OF UTERUS    . EYE SURGERY Bilateral 10/2017   cataract extraction   . NOSE SURGERY     Social History   Socioeconomic History  . Marital status: Married    Spouse name: Not on file  . Number of children: 2  . Years of education: Not on file  . Highest education level: Not on file  Occupational History  . Not on file  Social Needs  . Financial resource strain: Not on file  . Food insecurity    Worry: Not on file    Inability: Not on file  . Transportation needs    Medical: Not on file    Non-medical: Not on file  Tobacco Use  . Smoking status: Former Smoker    Packs/day: 3.00    Years: 22.00    Pack years: 66.00    Types: Cigarettes    Quit date: 02/18/2000    Years since quitting: 18.6  . Smokeless tobacco: Never Used  Substance and Sexual Activity  . Alcohol use: Yes    Alcohol/week: 0.0 standard drinks    Comment: RARELY  . Drug use: Never  . Sexual activity: Yes    Birth control/protection: Post-menopausal  Lifestyle  .  Physical activity    Days per week: Not on file    Minutes per session: Not on file  . Stress: Not on file  Relationships  . Social Herbalist on phone: Not on file    Gets together: Not on file    Attends religious service: Not on file    Active member of club or organization: Not on file    Attends meetings of clubs or organizations: Not on file    Relationship status: Not on file  . Intimate partner violence    Fear of current or ex partner: Not on file    Emotionally abused: Not on file    Physically abused: Not on file    Forced sexual activity: Not  on file  Other Topics Concern  . Not on file  Social History Narrative  . Not on file     Lipid Panel  No results found for: CHOL, TRIG, HDL, CHOLHDL, VLDL, LDLCALC, LDLDIRECT  Review of Systems: General: negative for chills, fever, night sweats or weight changes.  Cardiovascular: negative for chest pain, dyspnea on exertion, edema, orthopnea, palpitations, paroxysmal nocturnal dyspnea or shortness of breath Dermatological: negative for rash Respiratory: negative for cough or wheezing Urologic: negative for hematuria Abdominal: negative for nausea, vomiting, diarrhea, bright red blood per rectum, melena, or hematemesis Neurologic: negative for visual changes, syncope, or dizziness All other systems reviewed and are otherwise negative except as noted above.   Physical Exam:  Blood pressure 122/74, pulse 72, height 5\' 6"  (1.676 m), weight 202 lb (91.6 kg), SpO2 96 %.  General appearance: alert, cooperative and no distress Neck: no carotid bruit and no JVD Lungs: clear to auscultation bilaterally Heart: regular rate and rhythm, S1, S2 normal, no murmur, click, rub or gallop Extremities: extremities normal, atraumatic, no cyanosis or edema Pulses: 2+ and symmetric decrease DPs bilaterally Skin: Skin color, texture, turgor normal. No rashes or lesions Neurologic: Grossly normal  EKG not perfomed -- personally  reviewed   ASSESSMENT AND PLAN:   1. Chest Pain: coronary CTA showed no CAD and calcium score of 0. 2D echo showed normal LVEF, mild diastolic dysfunction and no significant valvular dz. CP may have been 2/2 elevated BP, which has normalized with medication management. No recurrent CP. No further cardiac w/u.   2. HTN: controlled w/ metoprolol. HR controlled. Tolerating well. BP  122/74. HR 72. Will change to once daily Toprol XL for convenience as she has hard time remembering to take evening dose of Lopressor. Start Toprol XL 50 mg tomorrow.   3. HLD: severely elevated LDL. Recent level 193. She was seen in Lipid clinic but declined advanced therapies (PCSK9i vs bempedoic acid). She has since had f/u with PCP and agreed to statin retrial. Back on Lipitor every other day and tolerating better. PCP will recheck panel in 1 month. LDL goal TBD based on PVD w/u. If evidence of PVD, LDL goal will be < 70. If no PVD, < 100 acceptable, given normal coronary CTA and normal coronary calcium score.   4. PVD Screening: per above (see HPI). Will order bilateral carotid artery dopplers and bilateral LE arterial dopplers. Will refer to [facility] clinic w/ Dr. Gwenlyn Found or Dr. Fletcher Anon if appropriate.    Follow-Up in 6 months w/ Dr. Roque Cash Ladoris Gene, M[facility] 10/05/2018 10:53 AM

## 2018-10-08 ENCOUNTER — Ambulatory Visit (HOSPITAL_COMMUNITY)
Admission: RE | Admit: 2018-10-08 | Discharge: 2018-10-08 | Disposition: A | Discharge status: Home / Self Care | Payer: 59 | Source: Ambulatory Visit | Attending: Cardiology | Admitting: Cardiology

## 2018-10-08 ENCOUNTER — Telehealth: Payer: Self-pay

## 2018-10-08 ENCOUNTER — Ambulatory Visit (HOSPITAL_BASED_OUTPATIENT_CLINIC_OR_DEPARTMENT_OTHER)
Admission: RE | Admit: 2018-10-08 | Discharge: 2018-10-08 | Disposition: A | Discharge status: Home / Self Care | Payer: 59 | Source: Ambulatory Visit | Attending: Cardiology | Admitting: Cardiology

## 2018-10-08 ENCOUNTER — Other Ambulatory Visit: Payer: Self-pay

## 2018-10-08 DIAGNOSIS — I739 Peripheral vascular disease, unspecified: Secondary | ICD-10-CM | POA: Diagnosis present

## 2018-10-08 DIAGNOSIS — I10 Essential (primary) hypertension: Secondary | ICD-10-CM | POA: Insufficient documentation

## 2018-10-08 DIAGNOSIS — R42 Dizziness and giddiness: Secondary | ICD-10-CM | POA: Insufficient documentation

## 2018-10-08 DIAGNOSIS — R0989 Other specified symptoms and signs involving the circulatory and respiratory systems: Secondary | ICD-10-CM | POA: Insufficient documentation

## 2018-10-08 NOTE — Telephone Encounter (Signed)
-----   Message from Charlie Pitter, [place] sent at 10/08/2018  4:15 PM EDT ----- Addendum to add: this is a second study result on this patient, see carotid duplex to call pt with both results. If leg pain persists could consider alternative statin in case this is contributing, but notes indicate she felt she was doing fairly well on atorvastatin. If she wants to try a different statin or alternative rx, please refer back to lipid clinic for management (had been following her previously) Melina Copa PA-C

## 2018-10-08 NOTE — Telephone Encounter (Signed)
Spoke with the pt and she says that her leg pain has been going on for an extensive amount of time... she reports just starting the Atorvastatin QOD about a week ago and has not noticed any worsening or any other symptoms but will continue to monitor and will let us know if she feels she may not be tolerating it but she would rather not switch meds at this time.   Pt to talk further with her PCP Dr. Sheryn Bison but feels relieved that her testing so far has not showed any problems contributing to her discomfort.

## 2018-10-13 NOTE — Progress Notes (Deleted)
Office Visit Note  Patient: Tracey Rodriguez             Date of Birth: 02-27-1960           MRN: [id number]             PCP: Aura Dials, MD Referring: Aura Dials, MD Visit Date: 10/27/2018 Occupation: @GUAROCC @  Subjective:  No chief complaint on file.   History of Present Illness: Tracey Rodriguez is a 58 y.o. female ***   Activities of Daily Living:  Patient reports morning stiffness for *** {minute/hour:19697}.   Patient {ACTIONS;DENIES/REPORTS:21021675::"Denies"} nocturnal pain.  Difficulty dressing/grooming: {ACTIONS;DENIES/REPORTS:21021675::"Denies"} Difficulty climbing stairs: {ACTIONS;DENIES/REPORTS:21021675::"Denies"} Difficulty getting out of chair: {ACTIONS;DENIES/REPORTS:21021675::"Denies"} Difficulty using hands for taps, buttons, cutlery, and/or writing: {ACTIONS;DENIES/REPORTS:21021675::"Denies"}  No Rheumatology ROS completed.   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   History of bilateral carpal tunnel release 03/15/2016   Unspecified hypothyroidism 03/30/2013   Fibromyalgia 03/30/2013   Breast cancer ([facility]) 03/30/2013   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   Dyspnea 02/02/2013   COPD (chronic obstructive pulmonary disease) ([facility]) 02/02/2013   Restrictive lung disease 02/02/2013    Past Medical History:  Diagnosis Date   Arthritis    deg disc disease  - lower back, neck   Cancer (HCC)    left breast surgery-lumpectomy   Disorder of vocal  cord    Factor V deficiency (HCC)    Fibromyalgia    GERD (gastroesophageal reflux disease)    Heart murmur    Hx of adenomatous polyp of colon 02/04/2018   Hyperlipidemia    Hypothyroidism    goiter   Neuromuscular disorder (HCC)    sciatic nerve   PONV (postoperative nausea and vomiting)    Rectocele 01/27/2018   SVD (spontaneous vaginal delivery)    x 2    Family History  Problem Relation Age of Onset   Heart disease Father 67   Heart attack Father    Clotting disorder Father    Rheumatologic disease Father    Crohn's disease Father    Heart disease Maternal Grandfather    Lung cancer Maternal Grandfather    Breast cancer Mother 65   Ovarian cancer Mother    Liver cancer Maternal Grandmother    Heart attack Brother    Cancer Brother    Lung cancer Brother    Colon polyps Brother    Colon polyps Brother    Breast cancer Maternal Aunt 60   Kidney disease Paternal Uncle    Irritable bowel syndrome Son    Past Surgical History:  Procedure Laterality Date   ABLATION     APPENDECTOMY     BREAST EXCISIONAL BIOPSY Left    BREAST SURGERY     left- lumpectomy   DILATATION & CURRETTAGE/HYSTEROSCOPY WITH RESECTOCOPE N/A 04/05/2012   Procedure: hysteroscopy with endocervical curretting;  Surgeon: Allyn Kenner, DO;  Location: [facility];  Service: Gynecology;  Laterality: N/A;   DILATION AND CURETTAGE OF UTERUS     EYE SURGERY Bilateral 10/2017   cataract  extraction    NOSE SURGERY     Social History   Social History Narrative   Not on file   Immunization History  Administered Date(s) Administered   Influenza Split 11/17/2012   Influenza-Unspecified 11/24/2016     Objective: Vital Signs: There were no vitals taken for this visit.   Physical Exam   Musculoskeletal Exam: ***  CDAI Exam: CDAI Score: -- Patient Global: --; Provider Global: -- Swollen: --; Tender: -- Joint Exam   No joint exam has been documented for  this visit   There is currently no information documented on the homunculus. Go to the Rheumatology activity and complete the homunculus joint exam.  Investigation: No additional findings.  Imaging: Ct Coronary Morph W/cta Cor W/score W/ca W/cm &/or Wo/cm  Addendum Date: 09/17/2018   ADDENDUM REPORT: 09/17/2018 17:23 CLINICAL DATA:  Chest pain EXAM: Cardiac CTA MEDICATIONS: Sub lingual nitro. 4mg  x 2 TECHNIQUE: The patient was scanned on a Siemens [id number] slice scanner. Gantry rotation speed was 250 msecs. Collimation was 0.6 mm. A 100 kV prospective scan was triggered in the ascending thoracic aorta at 35-75% of the R-R interval. Average HR during the scan was 60 bpm. The 3D data set was interpreted on a dedicated work station using MPR, MIP and VRT modes. A total of 80cc of contrast was used. FINDINGS: Non-cardiac: See separate report from [facility]. Pulmonary veins drain normally to the left atrium. Calcium Score: 0 Agatston units. Coronary Arteries: Right dominant with no anomalies LM: Short, no plaque or stenosis. LAD system:  No plaque or stenosis. Circumflex system: No plaque or stenosis. Misregistration artifact noted in mid LCx. RCA system: No plaque or stenosis. IMPRESSION: 1. Coronary artery calcium score 0 Agatston units. This suggests low risk for future cardiac events. 2.  No significant coronary disease noted. Dalton Mclean Electronically Signed   By: Loralie Champagne M.D.   On: 09/17/2018 17:23   Result Date: 09/17/2018 EXAM: OVER-READ INTERPRETATION  CT CHEST The following report is an over-read performed by radiologist Dr. Vinnie Langton of [facility], [place] on 09/16/2018. This over-read does not include interpretation of cardiac or coronary anatomy or pathology. The coronary calcium score/coronary CTA interpretation by the cardiologist is attached. COMPARISON:  No priors. FINDINGS: Within the visualized portions of the thorax there are no suspicious appearing pulmonary  nodules or masses, there is no acute consolidative airspace disease, no pleural effusions, no pneumothorax and no lymphadenopathy. Visualized portions of the upper abdomen are unremarkable. There are no aggressive appearing lytic or blastic lesions noted in the visualized portions of the skeleton. IMPRESSION: No significant incidental noncardiac findings are noted. Electronically Signed: By: Vinnie Langton M.D. On: 09/16/2018 16:00   Vas [place] Le Art Seg Multi (segm&le Reynauds)  Result Date: 10/09/2018 LOWER EXTREMITY DOPPLER STUDY Indications: Patient's main complaint is nocturnal leg pain and discoloration of              her toes. Her pulses were noted to be weak but palpable on office              visit 10/05/18. High Risk Factors: Hyperlipidemia, past history of smoking.  Performing Technologist: Mariane Masters RVT  Examination Guidelines: A complete evaluation includes at minimum, Doppler waveform signals and systolic blood pressure reading at the level of bilateral brachial, anterior tibial, and posterior tibial arteries, when vessel segments are accessible. Bilateral testing is considered an integral part of a complete examination. Photoelectric Plethysmograph (PPG) waveforms and toe systolic pressure readings are  included as required and additional duplex testing as needed. Limited examinations for reoccurring indications may be performed as noted.  ABI Findings: +---------+------------------+-----+---------+--------+  Right     Rt Pressure (mmHg) Index Waveform  Comment   +---------+------------------+-----+---------+--------+  Brachial  153                                          +---------+------------------+-----+---------+--------+  CFA                                triphasic           +---------+------------------+-----+---------+--------+  Popliteal                          biphasic            +---------+------------------+-----+---------+--------+  ATA       151                0.99  biphasic             +---------+------------------+-----+---------+--------+  PTA       156                1.02  biphasic            +---------+------------------+-----+---------+--------+  PERO      156                1.02  triphasic           +---------+------------------+-----+---------+--------+  Great Toe 127                0.83                      +---------+------------------+-----+---------+--------+ +---------+------------------+-----+---------+-------+  Left      Lt Pressure (mmHg) Index Waveform  Comment  +---------+------------------+-----+---------+-------+  Brachial  143                                         +---------+------------------+-----+---------+-------+  CFA                                triphasic          +---------+------------------+-----+---------+-------+  Popliteal                          biphasic           +---------+------------------+-----+---------+-------+  ATA       157                1.03  biphasic           +---------+------------------+-----+---------+-------+  PTA       161                1.05  biphasic           +---------+------------------+-----+---------+-------+  PERO      163                1.07  biphasic           +---------+------------------+-----+---------+-------+  Great Toe 89  0.58                     +---------+------------------+-----+---------+-------+ +-------+-----------+-----------+------------+------------+  ABI/TBI Today's ABI Today's TBI Previous ABI Previous TBI  +-------+-----------+-----------+------------+------------+  Right   1.02        0.83                                   +-------+-----------+-----------+------------+------------+  Left    1.07        0.58                                   +-------+-----------+-----------+------------+------------+  Summary: Right: Resting right ankle-brachial index is within normal range. No evidence of significant right lower extremity arterial disease. The right toe-brachial index is normal. Left:  Resting left ankle-brachial index is within normal range. No evidence of significant left lower extremity arterial disease. The left toe-brachial index is abnormal.  *See table(s) above for measurements and observations.  Electronically signed by Ena Dawley MD on 10/09/2018 at 9:18:47 AM.    Final    Vas US Carotid  Result Date: 10/09/2018 Carotid Arterial Duplex Study Indications:  Right bruit. Risk Factors: Hyperlipidemia, past history of smoking. Performing Technologist: Mariane Masters RVT  Examination Guidelines: A complete evaluation includes B-mode imaging, spectral Doppler, color Doppler, and power Doppler as needed of all accessible portions of each vessel. Bilateral testing is considered an integral part of a complete examination. Limited examinations for reoccurring indications may be performed as noted.  Right Carotid Findings: +----------+--------+--------+--------+------------------+--------+             PSV cm/s EDV cm/s Stenosis Plaque Description Comments  +----------+--------+--------+--------+------------------+--------+  CCA Prox   104      19                                             +----------+--------+--------+--------+------------------+--------+  CCA Distal 77       26                                             +----------+--------+--------+--------+------------------+--------+  ICA Prox   72       22       Normal                                +----------+--------+--------+--------+------------------+--------+  ICA Mid    70       26                                             +----------+--------+--------+--------+------------------+--------+  ICA Distal 57       21                                             +----------+--------+--------+--------+------------------+--------+  ECA        98  19                                             +----------+--------+--------+--------+------------------+--------+ +----------+--------+-------+----------------+-------------------+              PSV cm/s EDV cms Describe         Arm Pressure (mmHG)  +----------+--------+-------+----------------+-------------------+  Subclavian 125              Multiphasic, WNL 153                  +----------+--------+-------+----------------+-------------------+ +---------+--------+--+--------+--+---------+  Vertebral PSV cm/s 55 EDV cm/s 18 Antegrade  +---------+--------+--+--------+--+---------+   Left Carotid Findings: +----------+--------+--------+--------+------------------+--------+             PSV cm/s EDV cm/s Stenosis Plaque Description Comments  +----------+--------+--------+--------+------------------+--------+  CCA Prox   80       24                                             +----------+--------+--------+--------+------------------+--------+  CCA Distal 81       30                                             +----------+--------+--------+--------+------------------+--------+  ICA Prox   84       30       Normal                                +----------+--------+--------+--------+------------------+--------+  ICA Mid    79       30                                             +----------+--------+--------+--------+------------------+--------+  ICA Distal 91       34                                             +----------+--------+--------+--------+------------------+--------+  ECA        56       13                                             +----------+--------+--------+--------+------------------+--------+ +----------+--------+--------+----------------+-------------------+  Subclavian PSV cm/s EDV cm/s Describe         Arm Pressure (mmHG)  +----------+--------+--------+----------------+-------------------+             156               Multiphasic, WNL 143                  +----------+--------+--------+----------------+-------------------+ +---------+--------+--+--------+--+---------+  Vertebral PSV cm/s 43 EDV cm/s 11 Antegrade  +---------+--------+--+--------+--+---------+   Summary: Right  Carotid: There is no evidence of stenosis in the right ICA. Left Carotid: There is no  evidence of stenosis in the left ICA. Vertebrals:  Bilateral vertebral arteries demonstrate antegrade flow. Subclavians: Normal flow hemodynamics were seen in bilateral subclavian              arteries. *See table(s) above for measurements and observations.  Electronically signed by Ena Dawley MD on 10/09/2018 at 9:18:10 AM.    Final     Recent Labs: Lab Results  Component Value Date   WBC 8.1 09/08/2018   HGB 13.6 09/08/2018   PLT 307 09/08/2018   NA 143 09/08/2018   K 4.5 09/08/2018   CL 102 09/08/2018   CO2 25 09/08/2018   GLUCOSE 105 (H) 09/08/2018   BUN 17 09/08/2018   CREATININE 0.89 09/08/2018   BILITOT 0.5 09/29/2017   ALKPHOS 72 03/27/2013   AST 23 09/29/2017   ALT 26 09/29/2017   PROT 7.1 09/29/2017   PROT 7.2 09/29/2017   ALBUMIN 4.4 03/27/2013   CALCIUM 9.4 09/08/2018   GFRAA 83 09/08/2018    Speciality Comments: No specialty comments available.  Procedures:  No procedures performed Allergies: Capsaicin, Etodolac, Bee venom, Crestor [rosuvastatin], Doxycycline, Pepto-bismol [bismuth], Pork-derived products, and Sulfonamide derivatives   Assessment / Plan:     Visit Diagnoses: No diagnosis found.  Orders: No orders of the defined types were placed in this encounter.  No orders of the defined types were placed in this encounter.   Face-to-face time spent with patient was *** minutes. Greater than 50% of time was spent in counseling and coordination of care.  Follow-Up Instructions: No follow-ups on file.   Earnestine Mealing, CMA  Note - This record has been created using Editor, commissioning.  Chart creation errors have been sought, but may not always  have been located. Such creation errors do not reflect on  the standard of medical care.

## 2018-10-14 NOTE — Progress Notes (Signed)
Office Visit Note  Patient: Tracey Rodriguez             Date of Birth: [birth year]           MRN: [id number]             PCP: Aura Dials, MD Referring: Aura Dials, MD Visit Date: 10/20/2018 Occupation: @GUAROCC @  Subjective:  Lower back pain.   History of Present Illness: Tracey Rodriguez is a 58 y.o. female with history of fibromyalgia, osteoarthritis and degenerative disc disease.  She continues to have a lot of pain and discomfort in her lower back.  She states her SI joints are very painful again.  She would like to have cortisone injection to her bilateral SI joints.  She has had good response to the injections in the past.  Her hands are currently not as painful.  She states due to weather change she has been having increased pain from fibromyalgia.  Activities of Daily Living:  Patient reports morning stiffness for 24 hours.   Patient Reports nocturnal pain.  Difficulty dressing/grooming: Reports Difficulty climbing stairs: Reports Difficulty getting out of chair: Reports Difficulty using hands for taps, buttons, cutlery, and/or writing: Reports  Review of Systems  Constitutional: Negative for fatigue, night sweats, weight gain and weight loss.  HENT: Negative for mouth sores, trouble swallowing, trouble swallowing, mouth dryness and nose dryness.   Eyes: Positive for itching and dryness. Negative for pain, redness and visual disturbance.  Respiratory: Negative for cough, shortness of breath, wheezing and difficulty breathing.   Cardiovascular: Negative for chest pain, palpitations, hypertension, irregular heartbeat and swelling in legs/feet.  Gastrointestinal: Negative for blood in stool, constipation and diarrhea.  Endocrine: Negative for increased urination.  Genitourinary: Negative for painful urination and vaginal dryness.  Musculoskeletal: Positive for arthralgias, joint pain and morning stiffness. Negative for joint swelling, myalgias, muscle weakness,  muscle tenderness and myalgias.  Skin: Positive for redness. Negative for color change, rash, hair loss, skin tightness, ulcers and sensitivity to sunlight.  Allergic/Immunologic: Negative for susceptible to infections.  Neurological: Negative for dizziness, headaches, memory loss, night sweats and weakness.  Hematological: Negative for swollen glands.  Psychiatric/Behavioral: Positive for sleep disturbance. Negative for depressed mood and confusion. The patient is not nervous/anxious.     PMFS History:  Patient Active Problem List   Diagnosis Date Noted  . Hyperlipidemia 09/09/2018  . Hx of adenomatous polyp of colon 02/04/2018  . Rectocele 01/27/2018  . Change in bowel habits 01/05/2018  . Hemorrhoids 01/05/2018  . Hoarseness 07/30/2017  . Gastroesophageal reflux disease 07/30/2017  . History of breast cancer 09/11/2016  . Other fatigue 03/15/2016  . Primary insomnia 03/15/2016  . Primary osteoarthritis of both hands 03/15/2016  . DDD cervical spine 03/15/2016  . 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 History of bilateral carpal tunnel release 03/15/2016  . Unspecified hypothyroidism 03/30/2013  . Fibromyalgia 03/30/2013  . Breast cancer ([place]) 03/30/2013  . 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 Dyspnea 02/02/2013  . COPD (chronic obstructive pulmonary disease) ([place]) 02/02/2013  . Restrictive lung disease 02/02/2013    Past Medical History:  Diagnosis Date  . Arthritis    deg disc disease  - lower back, neck  . Cancer ([place])    left breast surgery-lumpectomy  . Disorder of vocal cord   . Factor V deficiency ([place])   . Fibromyalgia   . GERD (gastroesophageal reflux disease)   . Heart murmur   . Hx of adenomatous polyp of  colon 02/04/2018  . Hyperlipidemia   . Hypothyroidism    goiter  . Neuromuscular disorder ([place])    sciatic nerve  . PONV (postoperative nausea and vomiting)   . Rectocele 01/27/2018  . SVD (spontaneous vaginal delivery)    x 2    Family  History  Problem Relation Age of Onset  . Heart disease Father 75  . Heart attack Father   . Clotting disorder Father   . Rheumatologic disease Father   . Crohn's disease Father   . Heart disease Maternal Grandfather   . Lung cancer Maternal Grandfather   . Breast cancer Mother 2  . Ovarian cancer Mother   . Liver cancer Maternal Grandmother   . Heart attack Brother   . Cancer Brother   . Lung cancer Brother   . Colon polyps Brother   . Colon polyps Brother   . Breast cancer Maternal Aunt 60  . Kidney disease Paternal Uncle   . Irritable bowel syndrome Son    Past Surgical History:  Procedure Laterality Date  . ABLATION    . APPENDECTOMY    . BREAST EXCISIONAL BIOPSY Left   . BREAST SURGERY     left- lumpectomy  . DILATATION & CURRETTAGE/HYSTEROSCOPY WITH RESECTOCOPE N/A 04/05/2012   Procedure: hysteroscopy with endocervical curretting;  Surgeon: Allyn Kenner, DO;  Location: [facility];  Service: Gynecology;  Laterality: N/A;  . DILATION AND CURETTAGE OF UTERUS    . EYE SURGERY Bilateral 10/2017   cataract extraction   . LIVER BIOPSY  09/2018  . NOSE SURGERY     Social History   Social History Narrative  . Not on file   Immunization History  Administered Date(s) Administered  . Influenza Split 11/17/2012  . Influenza-Unspecified 11/24/2016     Objective: Vital Signs: BP 134/83 (BP Location: Left Arm, Patient Position: Sitting, Cuff Size: Normal)   Pulse 70   Resp 13   Ht 5\' 6"  (1.676 m)   Wt 208 lb 9.6 oz (94.6 kg)   BMI 33.67 kg/m    Physical Exam Vitals signs and nursing note reviewed.  Constitutional:      Appearance: She is well-developed.  HENT:     Head: Normocephalic and atraumatic.  Eyes:     Conjunctiva/sclera: Conjunctivae normal.  Neck:     Musculoskeletal: Normal range of motion.  Cardiovascular:     Rate and Rhythm: Normal rate and regular rhythm.     Heart sounds: Normal heart sounds.  Pulmonary:     Effort: Pulmonary effort is  normal.     Breath sounds: Normal breath sounds.  Abdominal:     General: Bowel sounds are normal.     Palpations: Abdomen is soft.  Lymphadenopathy:     Cervical: No cervical adenopathy.  Skin:    General: Skin is warm and dry.     Capillary Refill: Capillary refill takes less than 2 seconds.  Neurological:     Mental Status: She is alert and oriented to person, place, and time.  Psychiatric:        Behavior: Behavior normal.      Musculoskeletal Exam: C-spine and thoracic spine with good range of motion.  She has discomfort range of motion of her lumbar spine.  She had tenderness over bilateral SI joints.  Shoulder joints elbow joints wrist joints with good range of motion.  She has DIP and PIP thickening with no synovitis.  Hip joints with good range of motion.  Knee joints with good range of  motion.  Although she had a lot of lower back pain with range of motion of her hips and knees.  PIP and DIP changes were noted in her feet but no synovitis was noted.  CDAI Exam: CDAI Score: - Patient Global: -; Provider Global: - Swollen: -; Tender: - Joint Exam   No joint exam has been documented for this visit   There is currently no information documented on the homunculus. Go to the Rheumatology activity and complete the homunculus joint exam.  Investigation: No additional findings.  Imaging: Vas [place] Le Art Seg Multi (segm&le Reynauds)  Result Date: 10/09/2018 LOWER EXTREMITY DOPPLER STUDY Indications: Patient's main complaint is nocturnal leg pain and discoloration of              her toes. Her pulses were noted to be weak but palpable on office              visit 10/05/18. High Risk Factors: Hyperlipidemia, past history of smoking.  Performing Technologist: Mariane Masters RVT  Examination Guidelines: A complete evaluation includes at minimum, Doppler waveform signals and systolic blood pressure reading at the level of bilateral brachial, anterior tibial, and posterior tibial  arteries, when vessel segments are accessible. Bilateral testing is considered an integral part of a complete examination. Photoelectric Plethysmograph (PPG) waveforms and toe systolic pressure readings are included as required and additional duplex testing as needed. Limited examinations for reoccurring indications may be performed as noted.  ABI Findings: +---------+------------------+-----+---------+--------+ Right    Rt Pressure (mmHg)IndexWaveform Comment  +---------+------------------+-----+---------+--------+ Brachial 153                                      +---------+------------------+-----+---------+--------+ CFA                             triphasic         +---------+------------------+-----+---------+--------+ Popliteal                       biphasic          +---------+------------------+-----+---------+--------+ ATA      151               0.99 biphasic          +---------+------------------+-----+---------+--------+ PTA      156               1.02 biphasic          +---------+------------------+-----+---------+--------+ PERO     156               1.02 triphasic         +---------+------------------+-----+---------+--------+ Great Toe127               0.83                   +---------+------------------+-----+---------+--------+ +---------+------------------+-----+---------+-------+ Left     Lt Pressure (mmHg)IndexWaveform Comment +---------+------------------+-----+---------+-------+ Brachial 143                                     +---------+------------------+-----+---------+-------+ CFA                             triphasic        +---------+------------------+-----+---------+-------+ Popliteal  biphasic         +---------+------------------+-----+---------+-------+ ATA      157               1.03 biphasic         +---------+------------------+-----+---------+-------+ PTA      161                1.05 biphasic         +---------+------------------+-----+---------+-------+ PERO     163               1.07 biphasic         +---------+------------------+-----+---------+-------+ Great Toe89                0.58                  +---------+------------------+-----+---------+-------+ +-------+-----------+-----------+------------+------------+ ABI/TBIToday's ABIToday's TBIPrevious ABIPrevious TBI +-------+-----------+-----------+------------+------------+ Right  1.02       0.83                                +-------+-----------+-----------+------------+------------+ Left   1.07       0.58                                +-------+-----------+-----------+------------+------------+  Summary: Right: Resting right ankle-brachial index is within normal range. No evidence of significant right lower extremity arterial disease. The right toe-brachial index is normal. Left: Resting left ankle-brachial index is within normal range. No evidence of significant left lower extremity arterial disease. The left toe-brachial index is abnormal.  *See table(s) above for measurements and observations.  Electronically signed by Ena Dawley MD on 10/09/2018 at 9:18:47 AM.    Final    Vas US Carotid  Result Date: 10/09/2018 Carotid Arterial Duplex Study Indications:  Right bruit. Risk Factors: Hyperlipidemia, past history of smoking. Performing Technologist: Mariane Masters RVT  Examination Guidelines: A complete evaluation includes B-mode imaging, spectral Doppler, color Doppler, and power Doppler as needed of all accessible portions of each vessel. Bilateral testing is considered an integral part of a complete examination. Limited examinations for reoccurring indications may be performed as noted.  Right Carotid Findings: +----------+--------+--------+--------+------------------+--------+           PSV cm/sEDV cm/sStenosisPlaque DescriptionComments  +----------+--------+--------+--------+------------------+--------+ CCA Prox  104     19                                         +----------+--------+--------+--------+------------------+--------+ CCA Distal77      26                                         +----------+--------+--------+--------+------------------+--------+ ICA Prox  72      22      Normal                             +----------+--------+--------+--------+------------------+--------+ ICA Mid   70      26                                         +----------+--------+--------+--------+------------------+--------+  ICA Distal57      21                                         +----------+--------+--------+--------+------------------+--------+ ECA       98      19                                         +----------+--------+--------+--------+------------------+--------+ +----------+--------+-------+----------------+-------------------+           PSV cm/sEDV cmsDescribe        Arm Pressure (mmHG) +----------+--------+-------+----------------+-------------------+ [id number]            Multiphasic, [id number]                 +----------+--------+-------+----------------+-------------------+ +---------+--------+--+--------+--+---------+ VertebralPSV cm/s55EDV cm/s18Antegrade +---------+--------+--+--------+--+---------+   Left Carotid Findings: +----------+--------+--------+--------+------------------+--------+           PSV cm/sEDV cm/sStenosisPlaque DescriptionComments +----------+--------+--------+--------+------------------+--------+ CCA Prox  80      24                                         +----------+--------+--------+--------+------------------+--------+ CCA Distal81      30                                         +----------+--------+--------+--------+------------------+--------+ ICA Prox  84      30      Normal                              +----------+--------+--------+--------+------------------+--------+ ICA Mid   79      30                                         +----------+--------+--------+--------+------------------+--------+ ICA Distal91      34                                         +----------+--------+--------+--------+------------------+--------+ ECA       56      13                                         +----------+--------+--------+--------+------------------+--------+ +----------+--------+--------+----------------+-------------------+ SubclavianPSV cm/sEDV cm/sDescribe        Arm Pressure (mmHG) +----------+--------+--------+----------------+-------------------+           156             Multiphasic, [id number]                 +----------+--------+--------+----------------+-------------------+ +---------+--------+--+--------+--+---------+ VertebralPSV cm/s43EDV cm/s11Antegrade +---------+--------+--+--------+--+---------+   Summary: Right Carotid: There is no evidence of stenosis in the right ICA. Left Carotid: There is no evidence of stenosis in the left ICA. Vertebrals:  Bilateral vertebral arteries demonstrate antegrade flow. Subclavians: Normal flow hemodynamics were seen in bilateral subclavian  arteries. *See table(s) above for measurements and observations.  Electronically signed by Ena Dawley MD on 10/09/2018 at 9:18:10 AM.    Final     Recent Labs: Lab Results  Component Value Date   WBC 8.1 09/08/2018   HGB 13.6 09/08/2018   PLT 307 09/08/2018   NA 143 09/08/2018   K 4.5 09/08/2018   CL 102 09/08/2018   CO2 25 09/08/2018   GLUCOSE 105 (H) 09/08/2018   BUN 17 09/08/2018   CREATININE 0.89 09/08/2018   BILITOT 0.5 09/29/2017   ALKPHOS 72 03/27/2013   AST 23 09/29/2017   ALT 26 09/29/2017   PROT 7.1 09/29/2017   PROT 7.2 09/29/2017   ALBUMIN 4.4 03/27/2013   CALCIUM 9.4 09/08/2018   GFRAA 83 09/08/2018    Speciality Comments: No specialty comments  available.  Procedures:  Sacroiliac Joint Inj on 10/20/2018 10:51 AM Indications: pain Details: 27 G 1.5 in needle, posterior approach Medications (Right): 1 mL lidocaine 1 %; 40 mg triamcinolone acetonide 40 MG/ML Medications (Left): 1 mL lidocaine 1 %; 40 mg triamcinolone acetonide 40 MG/ML Outcome: tolerated well, no immediate complications Procedure, treatment alternatives, risks and benefits explained, specific risks discussed. Consent was given by the patient. Immediately prior to procedure a time out was called to verify the correct patient, procedure, equipment, support staff and site/side marked as required. Patient was prepped and draped in the usual sterile fashion.     Allergies: Capsaicin, Etodolac, Bee venom, Crestor [rosuvastatin], Doxycycline, Pepto-bismol [bismuth], Pork-derived products, and Sulfonamide derivatives   Assessment / Plan:     Visit Diagnoses: Fibromyalgia-she continues to have a lot of discomfort and generalized pain.  Other fatigue-secondary to insomnia.  Primary insomnia-good sleep hygiene discussed.  Primary osteoarthritis of both hands-currently not having much discomfort.  DDD (degenerative disc disease), cervical-she has good range of motion of her cervical spine.  DDD (degenerative disc disease), lumbar-she continues to have a lot of lower back pain.  Lumbar spine exercises were demonstrated in the office and handout was given.  Chronic SI joint pain-she is having increased SI joint pain.  She requests bilateral SI joint injection.  Indications side effects contraindications were discussed.  Both SI joints were injected as described below.  She tolerated the procedure well.  Postprocedure precautions were discussed.  History of bilateral carpal tunnel release  Factor V deficiency ([place])  History of COPD  History of breast cancer  Orders: Orders Placed This Encounter  Procedures  . Sacroiliac Joint Inj   No orders of the defined types were  placed in this encounter.   Face-to-face time spent with patient was 30 minutes. Greater than 50% of time was spent in counseling and coordination of care.  Follow-Up Instructions: Return in about 6 months (around 04/19/2019) for FMS, Osteoarthritis.   Bo Merino, MD  Note - This record has been created using Editor, commissioning.  Chart creation errors have been sought, but may not always  have been located. Such creation errors do not reflect on  the standard of medical care.

## 2018-10-20 ENCOUNTER — Other Ambulatory Visit: Payer: Self-pay

## 2018-10-20 ENCOUNTER — Ambulatory Visit (INDEPENDENT_AMBULATORY_CARE_PROVIDER_SITE_OTHER): Payer: 59 | Admitting: Rheumatology

## 2018-10-20 ENCOUNTER — Encounter: Payer: Self-pay | Admitting: Rheumatology

## 2018-10-20 VITALS — BP 134/83 | HR 70 | Resp 13 | Ht 66.0 in | Wt 208.6 lb

## 2018-10-20 DIAGNOSIS — M533 Sacrococcygeal disorders, not elsewhere classified: Secondary | ICD-10-CM | POA: Diagnosis not present

## 2018-10-20 DIAGNOSIS — G8929 Other chronic pain: Secondary | ICD-10-CM

## 2018-10-20 DIAGNOSIS — M797 Fibromyalgia: Secondary | ICD-10-CM

## 2018-10-20 DIAGNOSIS — M19041 Primary osteoarthritis, right hand: Secondary | ICD-10-CM

## 2018-10-20 DIAGNOSIS — Z8709 Personal history of other diseases of the respiratory system: Secondary | ICD-10-CM

## 2018-10-20 DIAGNOSIS — R5383 Other fatigue: Secondary | ICD-10-CM | POA: Diagnosis not present

## 2018-10-20 DIAGNOSIS — M19042 Primary osteoarthritis, left hand: Secondary | ICD-10-CM

## 2018-10-20 DIAGNOSIS — M5136 Other intervertebral disc degeneration, lumbar region: Secondary | ICD-10-CM

## 2018-10-20 DIAGNOSIS — F5101 Primary insomnia: Secondary | ICD-10-CM | POA: Diagnosis not present

## 2018-10-20 DIAGNOSIS — M51369 Other intervertebral disc degeneration, lumbar region without mention of lumbar back pain or lower extremity pain: Secondary | ICD-10-CM

## 2018-10-20 DIAGNOSIS — Z853 Personal history of malignant neoplasm of breast: Secondary | ICD-10-CM

## 2018-10-20 DIAGNOSIS — D682 Hereditary deficiency of other clotting factors: Secondary | ICD-10-CM

## 2018-10-20 DIAGNOSIS — Z9889 Other specified postprocedural states: Secondary | ICD-10-CM

## 2018-10-20 DIAGNOSIS — M5030 Other cervical disc degeneration, unspecified cervical region: Secondary | ICD-10-CM

## 2018-10-20 MED ORDER — LIDOCAINE HCL 1 % IJ SOLN
1.0000 mL | INTRAMUSCULAR | Status: AC | PRN
  Administered 2018-10-20: 1 mL

## 2018-10-20 MED ORDER — TRIAMCINOLONE ACETONIDE 40 MG/ML IJ SUSP
40.0000 mg | INTRAMUSCULAR | Status: AC | PRN
  Administered 2018-10-20: 40 mg via INTRA_ARTICULAR

## 2018-10-20 NOTE — Patient Instructions (Signed)

## 2018-10-27 ENCOUNTER — Ambulatory Visit: Payer: 59 | Admitting: Rheumatology

## 2018-11-17 ENCOUNTER — Other Ambulatory Visit: Payer: Self-pay | Admitting: Physician Assistant

## 2018-11-17 NOTE — Telephone Encounter (Signed)
Last Visit: 10/20/18 Next Visit: 04/20/19  Okay to refill per Dr. Estanislado Pandy

## 2018-12-27 ENCOUNTER — Other Ambulatory Visit: Payer: Self-pay

## 2018-12-27 DIAGNOSIS — R202 Paresthesia of skin: Secondary | ICD-10-CM

## 2018-12-29 ENCOUNTER — Other Ambulatory Visit: Payer: Self-pay

## 2018-12-29 ENCOUNTER — Ambulatory Visit (INDEPENDENT_AMBULATORY_CARE_PROVIDER_SITE_OTHER): Payer: 59 | Admitting: Neurology

## 2018-12-29 DIAGNOSIS — R202 Paresthesia of skin: Secondary | ICD-10-CM

## 2018-12-29 NOTE — Procedures (Signed)
[facility]  [place], [address]  [place], [place] [address] Tel: [phone] Fax:  [phone] Test Date:  12/29/2018  Patient: Tracey Rodriguez DOB: 07-20-1960 Physician: Narda Amber, DO  Sex: Female Height: 5\' 6"  Ref Phys: Wylene Simmer, MD  ID#: [id number] Temp: 32.0C Technician:    Patient Complaints: This is a 58 year old female referred for evaluation of bilateral feet pain, worse on the left.  NCV & EMG Findings: Electrodiagnostic testing of the right lower extremity and additional studies of the left shows: 1. Bilateral sural and superficial peroneal sensory responses are within normal limits. 2. Bilateral peroneal and tibial motor responses are within normal limits. 3. Bilateral tibial H reflex studies are within normal limits. 4. There is no evidence of active or chronic motor axonal changes affecting any of the tested muscles.  Motor unit configuration and recruitment pattern is within normal limits.  Impression: This is a normal study of the lower extremities.  In particular, there is no evidence of a sensorimotor polyneuropathy or lumbosacral radiculopathy.     ___________________________ Narda Amber, DO    Nerve Conduction Studies Anti Sensory Summary Table   Site NR Peak (ms) Norm Peak (ms) P-T Amp (V) Norm P-T Amp  Left Sup Peroneal Anti Sensory (Ant Lat Mall)  32C  12 cm    2.8 <4.6 11.8 >4  Right Sup Peroneal Anti Sensory (Ant Lat Mall)  32C  12 cm    2.1 <4.6 14.6 >4  Left Sural Anti Sensory (Lat Mall)  32C  Calf    3.3 <4.6 19.9 >4  Right Sural Anti Sensory (Lat Mall)  32C  Calf    3.3 <4.6 17.2 >4   Motor Summary Table   Site NR Onset (ms) Norm Onset (ms) O-P Amp (mV) Norm O-P Amp Site1 Site2 Delta-0 (ms) Dist (cm) Vel (m/s) Norm Vel (m/s)  Left Peroneal Motor (Ext Dig Brev)  32C  Ankle    5.1 <6.0 3.6 >2.5 B Fib Ankle 7.9 37.0 47 >40  B Fib    13.0  3.4  Poplt B Fib 1.5 7.0 47 >40  Poplt    14.5  3.4         Right  Peroneal Motor (Ext Dig Brev)  32C  Ankle    4.9 <6.0 3.6 >2.5 B Fib Ankle 8.2 37.0 45 >40  B Fib    13.1  3.5  Poplt B Fib 1.4 7.0 50 >40  Poplt    14.5  3.3         Left Tibial Motor (Abd Hall Brev)  32C  Ankle    3.9 <6.0 11.3 >4 Knee Ankle 9.1 41.0 45 >40  Knee    13.0  10.2         Right Tibial Motor (Abd Hall Brev)  32C  Ankle    4.0 <6.0 11.3 >4 Knee Ankle 9.0 41.0 46 >40  Knee    13.0  10.2          H Reflex Studies   NR H-Lat (ms) Lat Norm (ms) L-R H-Lat (ms)  Left Tibial (Gastroc)  32C     34.69 <35 0.27  Right Tibial (Gastroc)  32C     34.42 <35 0.27   EMG   Side Muscle Ins Act Fibs Psw Fasc Number Recrt Dur Dur. Amp Amp. Poly Poly. Comment  Right AntTibialis Nml Nml Nml Nml Nml Nml Nml Nml Nml Nml Nml Nml N/A  Right Gastroc Nml Nml Nml Nml Nml  Nml Nml Nml Nml Nml Nml Nml N/A  Right Flex Dig Long Nml Nml Nml Nml Nml Nml Nml Nml Nml Nml Nml Nml N/A  Right RectFemoris Nml Nml Nml Nml Nml Nml Nml Nml Nml Nml Nml Nml N/A  Right GluteusMed Nml Nml Nml Nml Nml Nml Nml Nml Nml Nml Nml Nml N/A  Left AntTibialis Nml Nml Nml Nml Nml Nml Nml Nml Nml Nml Nml Nml N/A  Left Gastroc Nml Nml Nml Nml Nml Nml Nml Nml Nml Nml Nml Nml N/A  Left Flex Dig Long Nml Nml Nml Nml Nml Nml Nml Nml Nml Nml Nml Nml N/A  Left RectFemoris Nml Nml Nml Nml Nml Nml Nml Nml Nml Nml Nml Nml N/A  Left GluteusMed Nml Nml Nml Nml Nml Nml Nml Nml Nml Nml Nml Nml N/A      Waveforms:

## 2019-02-01 ENCOUNTER — Telehealth: Payer: Self-pay | Admitting: Rheumatology

## 2019-02-01 NOTE — Telephone Encounter (Signed)
Last visit: 10/20/2018 Patient seen for DDD-cervical, DDD-lumbar and SI joint pain.   Would you like to schedule a virtual visit to discuss with patient?

## 2019-02-01 NOTE — Telephone Encounter (Signed)
Scheduled a virtual visit for 02/04/2019.

## 2019-02-01 NOTE — Telephone Encounter (Signed)
Okay to schedule a virtual visit.

## 2019-02-01 NOTE — Telephone Encounter (Signed)
Patient called stating she saw Dr. Susa Day from [facility] for her back and SI pain who recommended she have an MRI and/or CT scan.  Patient states she was told since she is a long-time patient of Dr. Estanislado Pandy she would be the doctor to order the MRI and/or scan.  Patient is requesting a return call to discuss how to proceed.

## 2019-02-02 NOTE — Progress Notes (Signed)
Virtual Visit via Telephone Note  I connected with Tracey Rodriguez on 02/04/19 at 11:45 AM EST by telephone and verified that I am speaking with the correct person using two identifiers.  Location: Patient: Home Provider: Clinic  This service was conducted via virtual visit.  The patient was located at home. I was located in my office.  Consent was obtained prior to the virtual visit and is aware of possible charges through their insurance for this visit.  The patient is an established patient.  Dr. Estanislado Pandy, MD conducted the virtual visit and Hazel Sams, PA-C acted as scribe during the service.  Office staff helped with scheduling follow up visits after the service was conducted.     I discussed the limitations, risks, security and privacy concerns of performing an evaluation and management service by telephone and the availability of in person appointments. I also discussed with the patient that there may be a patient responsible charge related to this service. The patient expressed understanding and agreed to proceed.  CC: Lower back pain  History of Present Illness: Patient is a 58 year old female with past medical history of fibromyalgia, osteoarthritis, and DDD. She has been having significant lower back pain. She is having severe right SI joint pain.  She has been performing back exercises, which have not helped.   She had a right SI joint injection on 10/20/18, which provided temporary relief. She has been seeing Dr. Tonita Cong and had x-rays recently.  According to the patient Dr. Tonita Cong discussed a SI joint fusion but she does not want to proceed with surgery at this time.  She would like to proceed with a MRI. She has tried taking  Robaxin, but it has not been effective recently. She reports she is having pain in both shoulder joints.  She has not had any recent x-rays of her shoulder joints.  She has been experiencing neck pain and stiffness as well.    Review of Systems  Constitutional:  Positive for malaise/fatigue. Negative for fever.  Eyes: Negative for photophobia, pain, discharge and redness.  Respiratory: Negative for cough, shortness of breath and wheezing.   Cardiovascular: Negative for chest pain and palpitations.  Gastrointestinal: Negative for blood in stool, constipation and diarrhea.  Genitourinary: Negative for dysuria.  Musculoskeletal: Positive for back pain, joint pain, myalgias and neck pain.  Skin: Negative for rash.  Neurological: Negative for dizziness and headaches.  Psychiatric/Behavioral: Negative for depression. The patient has insomnia. The patient is not nervous/anxious.       Observations/Objective: Physical Exam  Constitutional: She is oriented to person, place, and time.  Neurological: She is alert and oriented to person, place, and time.  Psychiatric: Mood, memory, affect and judgment normal.   Patient reports joint stiffness all day Patient reports nocturnal pain.  Difficulty dressing/grooming: Denies Difficulty climbing stairs: Reports  Difficulty getting out of chair: Reports Difficulty using hands for taps, buttons, cutlery, and/or writing: Denies   Assessment and Plan: Visit Diagnoses: Fibromyalgia-She has generalized muscle aches and muscle tenderness due to fibromyalgia.  She has trapezius muscle tension bilaterally.  She has been having increased lower back and right SI joint pain.  She was evaluated by Dr. Tonita Cong and had x-rays recently.  She has been taking robaxin as needed for muscle spasms but has not noticed any improvement.  She will follow up next week for a right SI joint cortisone injection.  She would like to try starting on Cymbalta 30 mg 1 capsule by mouth daily.  Indications, contraindications, and potential side effects of cymbalta were discussed. She was encouraged to exercise on a regular basis and practice good sleep hygiene .   Other fatigue-Secondary to insomnia.  Primary insomnia-She has been experiencing  nocturnal leading to interrupted sleep at night.   Primary osteoarthritis of both hands-She is not having any joint pain or inflammation at this time.   DDD (degenerative disc disease), cervical-She has been having neck pain and stiffness.  No symptoms of radiculopathy currently.   DDD (degenerative disc disease), lumbar-Chronic pain.  NCV with EMG of bilateral LE normal on 12/29/18.   Chronic SI joint pain-She is having severe right SI joint pain currently. She had a right SI joint on 10/20/18, which provided temporary relief. She has tried performing stretching exercises which have not been helpful.  She has been taking robaxin as prescribed but it has been ineffective at controlling her discomfort.  She had x-rays recently at Dr. Reather Littler office.  She also had a NCV with EMG on 12/29/18 of bilateral LE which was normal.  She would like to proceed with a MRI and will be contacting Dr. Tonita Cong to order this. She will return next week for a cortisone injection.   Other medical conditions are listed as follows:   History of bilateral carpal tunnel release  Factor V deficiency ([place])  History of COPD  History of breast cancer  Follow Up Instructions: She will follow up next week for a cortisone injection  Office visit follow up in 3 months.    I discussed the assessment and treatment plan with the patient. The patient was provided an opportunity to ask questions and all were answered. The patient agreed with the plan and demonstrated an understanding of the instructions.   The patient was advised to call back or seek an in-person evaluation if the symptoms worsen or if the condition fails to improve as anticipated.  I provided 25 minutes of non-face-to-face time during this encounter.   Bo Merino, MD   Scribed by-  Hazel Sams, PA-C

## 2019-02-04 ENCOUNTER — Encounter: Payer: Self-pay | Admitting: Rheumatology

## 2019-02-04 ENCOUNTER — Telehealth (INDEPENDENT_AMBULATORY_CARE_PROVIDER_SITE_OTHER): Payer: 59 | Admitting: Rheumatology

## 2019-02-04 ENCOUNTER — Other Ambulatory Visit: Payer: Self-pay

## 2019-02-04 DIAGNOSIS — Z853 Personal history of malignant neoplasm of breast: Secondary | ICD-10-CM

## 2019-02-04 DIAGNOSIS — M5030 Other cervical disc degeneration, unspecified cervical region: Secondary | ICD-10-CM

## 2019-02-04 DIAGNOSIS — M19041 Primary osteoarthritis, right hand: Secondary | ICD-10-CM

## 2019-02-04 DIAGNOSIS — G8929 Other chronic pain: Secondary | ICD-10-CM

## 2019-02-04 DIAGNOSIS — F5101 Primary insomnia: Secondary | ICD-10-CM

## 2019-02-04 DIAGNOSIS — M5136 Other intervertebral disc degeneration, lumbar region: Secondary | ICD-10-CM

## 2019-02-04 DIAGNOSIS — M797 Fibromyalgia: Secondary | ICD-10-CM | POA: Diagnosis not present

## 2019-02-04 DIAGNOSIS — M533 Sacrococcygeal disorders, not elsewhere classified: Secondary | ICD-10-CM

## 2019-02-04 DIAGNOSIS — Z9889 Other specified postprocedural states: Secondary | ICD-10-CM

## 2019-02-04 DIAGNOSIS — R5383 Other fatigue: Secondary | ICD-10-CM | POA: Diagnosis not present

## 2019-02-04 DIAGNOSIS — D682 Hereditary deficiency of other clotting factors: Secondary | ICD-10-CM

## 2019-02-04 DIAGNOSIS — Z8709 Personal history of other diseases of the respiratory system: Secondary | ICD-10-CM

## 2019-02-04 DIAGNOSIS — M19042 Primary osteoarthritis, left hand: Secondary | ICD-10-CM

## 2019-02-04 MED ORDER — DULOXETINE HCL 30 MG PO CPEP: 30.0000 mg | ORAL_CAPSULE | Freq: Every day | ORAL | 1 refills | Status: DC

## 2019-02-07 ENCOUNTER — Ambulatory Visit (INDEPENDENT_AMBULATORY_CARE_PROVIDER_SITE_OTHER): Payer: 59 | Admitting: Rheumatology

## 2019-02-07 ENCOUNTER — Other Ambulatory Visit: Payer: Self-pay

## 2019-02-07 VITALS — BP 129/84 | HR 74

## 2019-02-07 DIAGNOSIS — M533 Sacrococcygeal disorders, not elsewhere classified: Secondary | ICD-10-CM | POA: Diagnosis not present

## 2019-02-07 DIAGNOSIS — G8929 Other chronic pain: Secondary | ICD-10-CM | POA: Diagnosis not present

## 2019-02-07 MED ORDER — TRIAMCINOLONE ACETONIDE 40 MG/ML IJ SUSP
40.0000 mg | INTRAMUSCULAR | Status: AC | PRN
  Administered 2019-02-07: 40 mg via INTRA_ARTICULAR

## 2019-02-07 MED ORDER — LIDOCAINE HCL 1 % IJ SOLN
1.0000 mL | INTRAMUSCULAR | Status: AC | PRN
  Administered 2019-02-07: 1 mL

## 2019-02-07 NOTE — Progress Notes (Signed)
   Procedure Note  Patient: Tracey Rodriguez             Date of Birth: 1960-12-19           MRN: [id number]             Visit Date: 02/07/2019  Procedures: Visit Diagnoses:  1. Chronic SI joint pain     Sacroiliac Joint Inj on 02/07/2019 1:04 PM Indications: pain Details: 27 G 1.5 in needle, posterior approach Medications (Right): 1 mL lidocaine 1 %; 40 mg triamcinolone acetonide 40 MG/ML Aspirate (Right): 0 mL Medications (Left): 1 mL lidocaine 1 %; 40 mg triamcinolone acetonide 40 MG/ML Aspirate (Left): 0 mL Outcome: tolerated well, no immediate complications Procedure, treatment alternatives, risks and benefits explained, specific risks discussed. Consent was given by the patient. Immediately prior to procedure a time out was called to verify the correct patient, procedure, equipment, support staff and site/side marked as required. Patient was prepped and draped in the usual sterile fashion.     Bo Merino, MD

## 2019-02-08 ENCOUNTER — Telehealth: Payer: Self-pay | Admitting: Rheumatology

## 2019-02-08 NOTE — Telephone Encounter (Signed)
Per patient, she had xrays of her back with Dr. Tonita Cong. Patient has changed her mind, and would like to proceed with an MRI or CT of her back. Please call to advise.

## 2019-02-08 NOTE — Telephone Encounter (Signed)
Per note on 02/04/2019  She would like to proceed with a MRI and will be contacting Dr. Tonita Cong to order this.  Patient would like to proceed with MRI or CT. Please advise.

## 2019-02-09 ENCOUNTER — Other Ambulatory Visit: Payer: Self-pay

## 2019-02-09 MED ORDER — NEXIUM 40 MG PO CPDR: 40.0000 mg | DELAYED_RELEASE_CAPSULE | Freq: Two times a day (BID) | ORAL | 0 refills | Status: DC

## 2019-02-09 NOTE — Telephone Encounter (Signed)
I had detailed discussion with the patient at the last visit.  She is supposed to get her MRI through Dr. Bernadette Hoit office as he has done the x-rays and work-up.  She stated that she will contact his office.

## 2019-02-09 NOTE — Telephone Encounter (Signed)
Advised patient to contact Dr. Reather Littler office to order the MRI and patient verbalized understanding.

## 2019-02-14 ENCOUNTER — Telehealth: Payer: Self-pay

## 2019-02-14 NOTE — Telephone Encounter (Signed)
I have faxed a form to [employer] at fax # [phone] for the re-authorization of her brand name Nexium she takes for her GERD K21.9. Will await the response.

## 2019-02-15 NOTE — Telephone Encounter (Signed)
Patient has been approved for a year for the brand name Nexium.

## 2019-03-07 ENCOUNTER — Telehealth: Payer: Self-pay | Admitting: Rheumatology

## 2019-03-07 NOTE — Telephone Encounter (Signed)
Patient is requesting Cymbalta to be removed from her chart. Patient states she had severe hot flashes.

## 2019-03-07 NOTE — Telephone Encounter (Signed)
Patient left a voicemail requesting to speak with you directly.   °

## 2019-04-14 NOTE — Progress Notes (Signed)
Office Visit Note  Patient: Tracey Rodriguez             Date of Birth: Oct 12, 1960           MRN: [id number]             PCP: Aura Dials, MD Referring: Aura Dials, MD Visit Date: 04/20/2019 Occupation: @GUAROCC @  Subjective:  Back pain  History of Present Illness: Tracey Rodriguez is a 59 y.o. female with a past medical history of fibromyalgia, osteoarthritis, and degenerative disc disease. She endorses persistent cervical, thoracic, and lumbar spine pain with accompanying SI joint pain. She states that the pain is worse on her right side. This pain does not radiate anywhere else. She received SI joint injections in December, with some relief, but the pain has returned. She will take Robaxin or Aleve as needed when the pain is severe. However, she has noticed small blisters that appear on her hands when taking anti-inflammatory medication, such as Aleve or Ibuprofen. She currently sees a Paediatric nurse for this. She also endorses cramping in her bilateral toes. She denies numbness or tingling in her feet. She has recently noticed difficulty squatting and standing back up due to joint pain, but she denies muscle weakness at this time. She denies any other joint pain or any joint swelling at this time. She continues to experience fatigue, but states that the Phentermine she is taking for weight loss has helped with this. She also states that she does not sleep well at night due to her back pain and hot flashes.    Activities of Daily Living:  Patient reports morning stiffness for 24 hours.   Patient Reports nocturnal pain.  Difficulty dressing/grooming: Reports Difficulty climbing stairs: Reports Difficulty getting out of chair: Reports Difficulty using hands for taps, buttons, cutlery, and/or writing: Denies  Review of Systems  Constitutional: Positive for fatigue. Negative for night sweats, weight gain and weight loss.  HENT: Negative for mouth sores, trouble swallowing, trouble  swallowing, mouth dryness and nose dryness.   Eyes: Positive for dryness. Negative for pain, redness and visual disturbance.  Respiratory: Negative for cough, shortness of breath and difficulty breathing.   Cardiovascular: Negative for chest pain, palpitations, hypertension, irregular heartbeat and swelling in legs/feet.  Gastrointestinal: Negative for blood in stool, constipation and diarrhea.  Endocrine: Negative for increased urination.  Genitourinary: Negative for difficulty urinating, painful urination and vaginal dryness.  Musculoskeletal: Positive for arthralgias, joint pain, morning stiffness and muscle tenderness. Negative for joint swelling, myalgias, muscle weakness and myalgias.  Skin: Negative for color change, rash, hair loss, skin tightness, ulcers and sensitivity to sunlight.  Allergic/Immunologic: Negative for susceptible to infections.  Neurological: Negative for dizziness, numbness, headaches, memory loss, night sweats and weakness.  Hematological: Positive for bruising/bleeding tendency. Negative for swollen glands.  Psychiatric/Behavioral: Negative for depressed mood, confusion and sleep disturbance. The patient is not nervous/anxious.     PMFS History:  Patient Active Problem List   Diagnosis Date Noted  . Hyperlipidemia 09/09/2018  . Hx of adenomatous polyp of colon 02/04/2018  . Rectocele 01/27/2018  . Change in bowel habits 01/05/2018  . Hemorrhoids 01/05/2018  . Hoarseness 07/30/2017  . Gastroesophageal reflux disease 07/30/2017  . History of breast cancer 09/11/2016  . Other fatigue 03/15/2016  . Primary insomnia 03/15/2016  . Primary osteoarthritis of both hands 03/15/2016  . DDD cervical spine 03/15/2016  . 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 History of bilateral carpal tunnel release 03/15/2016  . Unspecified  hypothyroidism 03/30/2013  . Fibromyalgia 03/30/2013  . Breast cancer ([place]) 03/30/2013  . Factor V deficiency ([place]) 03/30/2013  .  Dyspnea 02/02/2013  . COPD (chronic obstructive pulmonary disease) ([place]) 02/02/2013  . Restrictive lung disease 02/02/2013    Past Medical History:  Diagnosis Date  . Arthritis    deg disc disease  - lower back, neck  . Cancer ([place])    left breast surgery-lumpectomy  . Disorder of vocal cord   . Factor V deficiency ([place])   . Fibromyalgia   . GERD (gastroesophageal reflux disease)   . Heart murmur   . Hx of adenomatous polyp of colon 02/04/2018  . Hyperlipidemia   . Hypothyroidism    goiter  . Neuromuscular disorder ([place])    sciatic nerve  . PONV (postoperative nausea and vomiting)   . Rectocele 01/27/2018  . SVD (spontaneous vaginal delivery)    x 2    Family History  Problem Relation Age of Onset  . Heart disease Father 61  . Heart attack Father   . Clotting disorder Father   . Rheumatologic disease Father   . Crohn's disease Father   . Heart disease Maternal Grandfather   . Lung cancer Maternal Grandfather   . Breast cancer Mother 53  . Ovarian cancer Mother   . Liver cancer Maternal Grandmother   . Heart attack Brother   . Cancer Brother   . Lung cancer Brother   . Colon polyps Brother   . Colon polyps Brother   . Breast cancer Maternal Aunt 60  . Kidney disease Paternal Uncle   . Irritable bowel syndrome Son   . Stroke Paternal Aunt   . Heart disease Paternal Aunt    Past Surgical History:  Procedure Laterality Date  . ABLATION    . APPENDECTOMY    . BREAST EXCISIONAL BIOPSY Left   . BREAST SURGERY     left- lumpectomy  . DILATATION & CURRETTAGE/HYSTEROSCOPY WITH RESECTOCOPE N/A 04/05/2012   Procedure: hysteroscopy with endocervical curretting;  Surgeon: Allyn Kenner, DO;  Location: [facility];  Service: Gynecology;  Laterality: N/A;  . DILATION AND CURETTAGE OF UTERUS    . EYE SURGERY Bilateral 10/2017   cataract extraction   . LIVER BIOPSY  09/2018  . NOSE SURGERY     Social History   Social History Narrative  . Not on file   Immunization  History  Administered Date(s) Administered  . Influenza Split 11/17/2012  . Influenza-Unspecified 11/24/2016     Objective: Vital Signs: BP 132/80 (BP Location: Right Arm, Patient Position: Sitting, Cuff Size: Normal)   Pulse 72   Resp 15   Ht 5\' 6"  (1.676 m)   Wt 219 lb (99.3 kg)   BMI 35.35 kg/m    Physical Exam Constitutional:      General: She is not in acute distress.    Appearance: Normal appearance.  HENT:     Head: Normocephalic and atraumatic.  Eyes:     Conjunctiva/sclera: Conjunctivae normal.  Cardiovascular:     Rate and Rhythm: Normal rate.  Pulmonary:     Effort: Pulmonary effort is normal.  Abdominal:     Palpations: Abdomen is soft.  Musculoskeletal:        General: Tenderness (Midline spinal tenderness, SI joint tenderness) present. No swelling.     Cervical back: Tenderness present.     Right lower leg: No edema.     Left lower leg: No edema.  Skin:    General: Skin is warm and  dry.     Findings: No rash.  Neurological:     Mental Status: She is oriented to person, place, and time.  Psychiatric:        Behavior: Behavior normal.      Musculoskeletal Exam: Limited ROM of cervical spine with accompanying discomfort. Limited ROM of thoracic and lumbar spine. Midline spinal tenderness and SI joint tenderness present. Shoulder joints, elbow joints, wrist joints, MCPs, PIPs, and DIPs with good ROM and no synovitis. PIP and DIP synovial thickening present consistent with osteoarthritis of bilateral hands. Complete fist formation bilaterally. Hip joints with good ROM, but discomfort present. Knee joints, ankle joints, MTPs, and PIPs with good ROM. No warmth or effusion noted of bilateral knees.   CDAI Exam: CDAI Score: -- Patient Global: --; Provider Global: -- Swollen: --; Tender: -- Joint Exam 04/20/2019   No joint exam has been documented for this visit   There is currently no information documented on the homunculus. Go to the Rheumatology activity  and complete the homunculus joint exam.  Investigation: No additional findings.  Imaging: No results found.  Recent Labs: Lab Results  Component Value Date   WBC 8.1 09/08/2018   HGB 13.6 09/08/2018   PLT 307 09/08/2018   NA 143 09/08/2018   K 4.5 09/08/2018   CL 102 09/08/2018   CO2 25 09/08/2018   GLUCOSE 105 (H) 09/08/2018   BUN 17 09/08/2018   CREATININE 0.89 09/08/2018   BILITOT 0.5 09/29/2017   ALKPHOS 72 03/27/2013   AST 23 09/29/2017   ALT 26 09/29/2017   PROT 7.1 09/29/2017   PROT 7.2 09/29/2017   ALBUMIN 4.4 03/27/2013   CALCIUM 9.4 09/08/2018   GFRAA 83 09/08/2018    Speciality Comments: No specialty comments available.  Procedures:  No procedures performed Allergies: Capsaicin, Etodolac, Bee venom, Crestor [rosuvastatin], Doxycycline, Pepto-bismol [bismuth], Pork-derived products, and Sulfonamide derivatives   Assessment / Plan:     Visit Diagnoses: Fibromyalgia - She continues to experience muscle aches and pains, especially in the cervical, thoracic, and lumbar spine. She also has persistent SI joint pain. She received two SI joint injections on 02/07/2019 which helped alleviate some of the pain, but now the pain has returned. She will use Robaxin or Aleve as needed for severe pain or muscle spasms with some relief. She requested SI joints injections again today, but we were unable to perform due to how recent her last SI joint injections were. We will schedule a follow up appointment for SI joint injections as soon as she is able.   Other fatigue - Patient states that her fatigue has improved since starting Phentermine for weight loss management.  Primary insomnia - She has continued difficulty sleeping at night due to her chronic back pain and hot flashes.   Primary osteoarthritis of both hands - She denies any pain or swelling in her bilateral hands. She has mild PIP and DIP synovial thickening consistent with osteoarthritis of bilateral hands.  DDD  (degenerative disc disease), cervical - She endorses persistent cervical spine pain that is worse on the right side. No symptoms of radiculopathy present. She had limited ROM of her cervical spine with accompanying discomfort.   DDD (degenerative disc disease), lumbar - NCV with EMG of bilateral LE normal on 12/29/18. She continues to experience lumbar spine pain. This pain does not radiate. No symptoms of radiculopathy.   Chronic SI joint pain - NCV with EMG on 12/29/18 of bilateral LE which was normal. She continues to experience  bilateral SI joint pain. She was tender to palpation. She received two SI joint injections on 02/07/2019, which offered some relief, but the pain has returned. She requested injections today, but due to the timing, we will reschedule for late March.     History of bilateral carpal tunnel release  Factor V deficiency ([place])  History of breast cancer  History of COPD  Orders: No orders of the defined types were placed in this encounter.  No orders of the defined types were placed in this encounter.    Follow-Up Instructions: Return in about 6 months (around 10/21/2019) for FMS, OA, DDD.   Bo Merino, MD  Note - This record has been created using Editor, commissioning.  Chart creation errors have been sought, but may not always  have been located. Such creation errors do not reflect on  the standard of medical care.

## 2019-04-20 ENCOUNTER — Other Ambulatory Visit: Payer: Self-pay

## 2019-04-20 ENCOUNTER — Ambulatory Visit (INDEPENDENT_AMBULATORY_CARE_PROVIDER_SITE_OTHER): Payer: 59 | Admitting: Rheumatology

## 2019-04-20 ENCOUNTER — Encounter: Payer: Self-pay | Admitting: Rheumatology

## 2019-04-20 VITALS — BP 132/80 | HR 72 | Resp 15 | Ht 66.0 in | Wt 219.0 lb

## 2019-04-20 DIAGNOSIS — Z8709 Personal history of other diseases of the respiratory system: Secondary | ICD-10-CM

## 2019-04-20 DIAGNOSIS — G8929 Other chronic pain: Secondary | ICD-10-CM

## 2019-04-20 DIAGNOSIS — M19042 Primary osteoarthritis, left hand: Secondary | ICD-10-CM

## 2019-04-20 DIAGNOSIS — F5101 Primary insomnia: Secondary | ICD-10-CM | POA: Diagnosis not present

## 2019-04-20 DIAGNOSIS — Z9889 Other specified postprocedural states: Secondary | ICD-10-CM

## 2019-04-20 DIAGNOSIS — M797 Fibromyalgia: Secondary | ICD-10-CM

## 2019-04-20 DIAGNOSIS — M19041 Primary osteoarthritis, right hand: Secondary | ICD-10-CM

## 2019-04-20 DIAGNOSIS — D682 Hereditary deficiency of other clotting factors: Secondary | ICD-10-CM

## 2019-04-20 DIAGNOSIS — M5136 Other intervertebral disc degeneration, lumbar region: Secondary | ICD-10-CM

## 2019-04-20 DIAGNOSIS — M533 Sacrococcygeal disorders, not elsewhere classified: Secondary | ICD-10-CM

## 2019-04-20 DIAGNOSIS — M5030 Other cervical disc degeneration, unspecified cervical region: Secondary | ICD-10-CM

## 2019-04-20 DIAGNOSIS — M51369 Other intervertebral disc degeneration, lumbar region without mention of lumbar back pain or lower extremity pain: Secondary | ICD-10-CM

## 2019-04-20 DIAGNOSIS — R5383 Other fatigue: Secondary | ICD-10-CM

## 2019-04-20 DIAGNOSIS — Z853 Personal history of malignant neoplasm of breast: Secondary | ICD-10-CM

## 2019-05-11 ENCOUNTER — Ambulatory Visit (INDEPENDENT_AMBULATORY_CARE_PROVIDER_SITE_OTHER): Payer: 59 | Admitting: Rheumatology

## 2019-05-11 ENCOUNTER — Other Ambulatory Visit: Payer: Self-pay

## 2019-05-11 VITALS — BP 121/80 | HR 73

## 2019-05-11 DIAGNOSIS — G8929 Other chronic pain: Secondary | ICD-10-CM

## 2019-05-11 DIAGNOSIS — M533 Sacrococcygeal disorders, not elsewhere classified: Secondary | ICD-10-CM

## 2019-05-11 NOTE — Progress Notes (Signed)
   Procedure Note  Patient: [name]             Date of Birth: 04-15-1960           MRN: [id number]             Visit Date: 05/11/2019  Procedures: Visit Diagnoses:  1. Chronic SI joint pain     Sacroiliac Joint Inj on 05/11/2019 10:59 AM Indications: pain Details: 27 G 1.5 in needle, posterior approach Medications (Right): 1 mL lidocaine 1 %; 40 mg triamcinolone acetonide 40 MG/ML Aspirate (Right): 0 mL Medications (Left): 1 mL lidocaine 1 %; 40 mg triamcinolone acetonide 40 MG/ML Aspirate (Left): 0 mL Outcome: tolerated well, no immediate complications Procedure, treatment alternatives, risks and benefits explained, specific risks discussed. Consent was given by the patient. Immediately prior to procedure a time out was called to verify the correct patient, procedure, equipment, support staff and site/side marked as required. Patient was prepped and draped in the usual sterile fashion.      Patient tolerated the procedure well.  Aftercare was discussed.   Bo Merino, MD

## 2019-06-09 DIAGNOSIS — K760 Fatty (change of) liver, not elsewhere classified: Secondary | ICD-10-CM | POA: Diagnosis not present

## 2019-06-09 DIAGNOSIS — E669 Obesity, unspecified: Secondary | ICD-10-CM | POA: Diagnosis not present

## 2019-06-09 DIAGNOSIS — Z6835 Body mass index (BMI) 35.0-35.9, adult: Secondary | ICD-10-CM | POA: Diagnosis not present

## 2019-06-09 DIAGNOSIS — E8881 Metabolic syndrome: Secondary | ICD-10-CM | POA: Diagnosis not present

## 2019-06-14 DIAGNOSIS — J449 Chronic obstructive pulmonary disease, unspecified: Secondary | ICD-10-CM | POA: Diagnosis not present

## 2019-06-14 DIAGNOSIS — I10 Essential (primary) hypertension: Secondary | ICD-10-CM | POA: Diagnosis not present

## 2019-06-14 DIAGNOSIS — E785 Hyperlipidemia, unspecified: Secondary | ICD-10-CM | POA: Diagnosis not present

## 2019-06-14 DIAGNOSIS — Z0000 Encounter for general adult medical examination without abnormal findings: Secondary | ICD-10-CM | POA: Diagnosis not present

## 2019-06-20 DIAGNOSIS — L570 Actinic keratosis: Secondary | ICD-10-CM | POA: Diagnosis not present

## 2019-06-20 DIAGNOSIS — D692 Other nonthrombocytopenic purpura: Secondary | ICD-10-CM | POA: Diagnosis not present

## 2019-06-20 DIAGNOSIS — L814 Other melanin hyperpigmentation: Secondary | ICD-10-CM | POA: Diagnosis not present

## 2019-06-20 DIAGNOSIS — L905 Scar conditions and fibrosis of skin: Secondary | ICD-10-CM | POA: Diagnosis not present

## 2019-06-21 DIAGNOSIS — H04123 Dry eye syndrome of bilateral lacrimal glands: Secondary | ICD-10-CM | POA: Diagnosis not present

## 2019-06-21 DIAGNOSIS — H0288A Meibomian gland dysfunction right eye, upper and lower eyelids: Secondary | ICD-10-CM | POA: Diagnosis not present

## 2019-06-21 DIAGNOSIS — H26493 Other secondary cataract, bilateral: Secondary | ICD-10-CM | POA: Diagnosis not present

## 2019-06-21 DIAGNOSIS — H0288B Meibomian gland dysfunction left eye, upper and lower eyelids: Secondary | ICD-10-CM | POA: Diagnosis not present

## 2019-07-05 DIAGNOSIS — L570 Actinic keratosis: Secondary | ICD-10-CM | POA: Diagnosis not present

## 2019-07-05 DIAGNOSIS — L280 Lichen simplex chronicus: Secondary | ICD-10-CM | POA: Diagnosis not present

## 2019-07-05 DIAGNOSIS — D485 Neoplasm of uncertain behavior of skin: Secondary | ICD-10-CM | POA: Diagnosis not present

## 2019-07-05 DIAGNOSIS — L989 Disorder of the skin and subcutaneous tissue, unspecified: Secondary | ICD-10-CM | POA: Diagnosis not present

## 2019-07-12 ENCOUNTER — Other Ambulatory Visit: Payer: Self-pay | Admitting: Internal Medicine

## 2019-07-12 ENCOUNTER — Other Ambulatory Visit: Payer: Self-pay | Admitting: Cardiology

## 2019-07-21 DIAGNOSIS — N3281 Overactive bladder: Secondary | ICD-10-CM | POA: Diagnosis not present

## 2019-07-21 DIAGNOSIS — I10 Essential (primary) hypertension: Secondary | ICD-10-CM | POA: Diagnosis not present

## 2019-07-22 DIAGNOSIS — T148XXA Other injury of unspecified body region, initial encounter: Secondary | ICD-10-CM | POA: Diagnosis not present

## 2019-07-22 DIAGNOSIS — K760 Fatty (change of) liver, not elsewhere classified: Secondary | ICD-10-CM | POA: Diagnosis not present

## 2019-07-22 DIAGNOSIS — R7303 Prediabetes: Secondary | ICD-10-CM | POA: Diagnosis not present

## 2019-07-22 DIAGNOSIS — E669 Obesity, unspecified: Secondary | ICD-10-CM | POA: Diagnosis not present

## 2019-07-22 DIAGNOSIS — E8881 Metabolic syndrome: Secondary | ICD-10-CM | POA: Diagnosis not present

## 2019-07-22 DIAGNOSIS — L089 Local infection of the skin and subcutaneous tissue, unspecified: Secondary | ICD-10-CM | POA: Diagnosis not present

## 2019-08-03 DIAGNOSIS — L905 Scar conditions and fibrosis of skin: Secondary | ICD-10-CM | POA: Diagnosis not present

## 2019-08-03 DIAGNOSIS — D485 Neoplasm of uncertain behavior of skin: Secondary | ICD-10-CM | POA: Diagnosis not present

## 2019-08-10 DIAGNOSIS — R601 Generalized edema: Secondary | ICD-10-CM | POA: Diagnosis not present

## 2019-08-10 NOTE — Progress Notes (Signed)
Cardiology Office Note   Date:  08/12/2019   ID:  Tracey Rodriguez, DOB [birth year], MRN [id number]  PCP:  Tracey Dials, MD    No chief complaint on file.  FH of CAD  Wt Readings from Last 3 Encounters:  08/12/19 222 lb 1.9 oz (100.8 kg)  04/20/19 219 lb (99.3 kg)  10/20/18 208 lb 9.6 oz (94.6 kg)       History of Present Illness: Tracey Rodriguez is a 59 y.o. female  Who has hyperlipidemia, and family history of premature coronary artery disease.  She had carpal tunnel surgery in 10/16.  After the surgery, she had a squeezing feeling in both arms.  She had several episodes of this in December.  It happened again early 2017.    In Feb 2017, she had chest pain with arm pain with presyncope and dyspnea.  She did not fully pass out.  She was at [employer] at the time.    In 2017, she had a cardiac eval with Dr. Einar Rodriguez.  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  She had a nuclear stress test that was negative for ischemia.    When wearing the monitor, she had some fatigue and this correlated to allow BP.  I saw her in 2017.  At that time, she had intermittent dizziness for which we encouraged her to stay well-hydrated.  She was drinking a lot of coffee at that time.  She was not sleeping well.  We also discussed her hyperlipidemia.  She had reported difficulty affording red yeast rice.  She had itching with Crestor.  She had memory issues with simvastatin.  We discussed trying atorvastatin but she preferred trying red yeast rice.  She saw Ellen Henri in August 2020.  She was referred to lipid clinic due to statin intolerance.  Metoprolol was added for blood pressure.  2D echo was done showing normal LVEF with mild diastolic dysfunction.  Coronary CTA showed no CAD and a calcium score of 0.  In the lipid clinic, the patient was offered PCSK9 versus Zetia versus bempedoic acid but she declined.  She ultimately ended up trying medium  dose Lipitor and tolerated this for some time.  She had been started been on amlodipine for HTN and developed some swelling in her legs.  It was stopped 2 days ago.    Denies : Chest pain. Dizziness. Leg edema. Nitroglycerin use. Orthopnea. Palpitations. Paroxysmal nocturnal dyspnea. Shortness of breath. Syncope.   Tried on atorvastatin but had knee pain so this was stopped.  Past Medical History:  Diagnosis Date  . Arthritis    deg disc disease  - lower back, neck  . Cancer ([place])    left breast surgery-lumpectomy  . Disorder of vocal cord   . Factor V deficiency ([place])   . Fibromyalgia   . GERD (gastroesophageal reflux disease)   . Heart murmur   . Hx of adenomatous polyp of colon 02/04/2018  . Hyperlipidemia   . Hypothyroidism    goiter  . Neuromuscular disorder ([place])    sciatic nerve  . PONV (postoperative nausea and vomiting)   . Rectocele 01/27/2018  . SVD (spontaneous vaginal delivery)    x 2    Past Surgical History:  Procedure Laterality Date  . ABLATION    . APPENDECTOMY    . BREAST EXCISIONAL BIOPSY Left   . BREAST SURGERY     left- lumpectomy  . DILATATION &  CURRETTAGE/HYSTEROSCOPY WITH RESECTOCOPE N/A 04/05/2012   Procedure: hysteroscopy with endocervical curretting;  Surgeon: Allyn Kenner, DO;  Location: [facility];  Service: Gynecology;  Laterality: N/A;  . DILATION AND CURETTAGE OF UTERUS    . EYE SURGERY Bilateral 10/2017   cataract extraction   . LIVER BIOPSY  09/2018  . NOSE SURGERY       Current Outpatient Medications  Medication Sig Dispense Refill  . albuterol (PROVENTIL HFA;VENTOLIN HFA) 108 (90 BASE) MCG/ACT inhaler Inhale 2 puffs into the lungs every 6 (six) hours as needed for wheezing or shortness of breath.    Marland Kitchen b complex vitamins tablet Take by mouth. Use asdirected    . Biotin 5000 MCG CAPS Take 5,000 mcg by mouth daily.    . Carboxymethylcellulose Sodium (EYE DROPS OP) Apply to eye 2 (two) times daily.    . cetirizine (ZYRTEC) 10 MG  tablet Take 10 mg by mouth as needed.     . diclofenac sodium (VOLTAREN) 1 % GEL Apply 1 application topically daily as needed (arthritis).    . Doxycycline Hyclate (DORYX) 200 MG TBEC Take 200 mg by mouth daily as needed (skin inflammation).    Marland Kitchen doxylamine, Sleep, (UNISOM) 25 MG tablet Take 25 mg by mouth at bedtime as needed.    Marland Kitchen EPINEPHrine (EPIPEN JR) 0.15 MG/0.3ML injection epinephrine (Jr) 0.15 mg/0.3 mL injection,auto-injector  INJECT AS DIRECTED    . fluocinonide ointment (LIDEX) 6.28 % Apply 1 application topically as needed.     . furosemide (LASIX) 20 MG tablet Take 1 tablet (20 mg total) by mouth daily as needed for fluid or edema. 90 tablet 3  . hydrocortisone (ANUSOL-HC) 2.5 % rectal cream Place 1 application rectally 2 (two) times daily. (Patient taking differently: Place 1 application rectally as needed. ) 30 g 1  . levothyroxine (SYNTHROID, LEVOTHROID) 50 MCG tablet Take 50 mcg by mouth daily.    . methocarbamol (ROBAXIN) 500 MG tablet TAKE 1 TABLET(500 MG) BY MOUTH TWICE DAILY AS NEEDED FOR MUSCLE SPASMS 30 tablet 0  . metoprolol succinate (TOPROL-XL) 50 MG 24 hr tablet Take 1 tablet (50 mg total) by mouth daily. Take with or immediately following a meal. Please make yearly appt with Dr. Irish Lack for August before anymore refills. 1st attempt 90 tablet 0  . mirabegron ER (MYRBETRIQ) 50 MG TB24 Take 50 mg by mouth daily.    . Multiple Vitamins-Minerals (MULTIVITAMIN PO) Take by mouth daily.    . mupirocin ointment (BACTROBAN) 2 % Place 1 application into the nose daily as needed (blood blisters).    Marland Kitchen NEXIUM 40 MG capsule Take 1 capsule (40 mg total) by mouth 2 (two) times daily before a meal. 180 capsule 0  . OVER THE COUNTER MEDICATION Take 2 capsules by mouth daily. Med Name: STRESS B COMPLEX CAPSULE    . QSYMIA 7.5-46 MG CP24 Take 1 capsule by mouth daily.    Marland Kitchen scopolamine (TRANSDERM-SCOP, 1.5 MG,) 1 MG/3DAYS Transderm-Scop 1.5 mg transdermal patch (1 mg over 3 days)    .  TURMERIC PO Take by mouth daily.     No current facility-administered medications for this visit.    Allergies:   Capsaicin, Etodolac, Bee venom, Crestor [rosuvastatin], Doxycycline, Pepto-bismol [bismuth], Pork-derived products, and Sulfonamide derivatives    Social History:  The patient  reports that she quit smoking about 19 years ago. Her smoking use included cigarettes. She has a 66.00 pack-year smoking history. She has never used smokeless tobacco. She reports current alcohol use. She  reports that she does not use drugs.   Family History:  The patient's family history includes Breast cancer (age of onset: 49) in her maternal aunt; Breast cancer (age of onset: 73) in her mother; Cancer in her brother; Clotting disorder in her father; Colon polyps in her brother and brother; Crohn's disease in her father; Heart attack in her brother and father; Heart disease in her maternal grandfather and paternal aunt; Heart disease (age of onset: 41) in her father; Irritable bowel syndrome in her son; Kidney disease in her paternal uncle; Liver cancer in her maternal grandmother; Lung cancer in her brother and maternal grandfather; Ovarian cancer in her mother; Rheumatologic disease in her father; Stroke in her paternal aunt.    ROS:  Please see the history of present illness.   Otherwise, review of systems are positive for joint pain improved off of atorvastatin.   All other systems are reviewed and negative.    PHYSICAL EXAM: VS:  BP (!) 148/82   Pulse 72   Ht 5\' 6"  (1.676 m)   Wt 222 lb 1.9 oz (100.8 kg)   SpO2 98%   BMI 35.85 kg/m  , BMI Body mass index is 35.85 kg/m. GEN: Well nourished, well developed, in no acute distress  HEENT: normal  Neck: no JVD, carotid bruits, or masses Cardiac: RRR; no murmurs, rubs, or gallops,no edema  Respiratory:  clear to auscultation bilaterally, normal work of breathing GI: soft, nontender, nondistended, + BS MS: no deformity or atrophy  Skin: warm and  dry, no rash Neuro:  Strength and sensation are intact Psych: euthymic mood, full affect   EKG:   The ekg ordered today demonstrates normal   Recent Labs: 09/08/2018: BUN 17; Creatinine, Ser 0.89; Hemoglobin 13.6; Platelets 307; Potassium 4.5; Sodium 143   Lipid Panel No results found for: CHOL, TRIG, HDL, CHOLHDL, VLDL, LDLCALC, LDLDIRECT   Other studies Reviewed: Additional studies/ records that were reviewed today with results demonstrating: April 2021 labs reviewed.     ASSESSMENT AND PLAN:  1. Hyperlipidemia: She stopped her atorvastatin 3 weeks ago due to perceived joint pain.  I explained to her that statins typically cause muscle pain rather than joint pain.  GIven calcium score of 0, will not push on issue of statin at this time.  HDL was 66.   2. Hypertension: The current medical regimen is effective;  continue present plan and medications.  Home readings are well controlled.  Higher in the MDs office. 3. Family history of premature coronary artery disease:  No change recently.   4. Morbid obesity: Trying weight loss drug.   Try recumbent bike; she ordered a bike on QVC. Whole food, plant based diet.  Avoid processed foods. 5. Chronic pain: She is trying to deal with this without narcotics. 6. Palpitations: negative monitor in 2017.    Current medicines are reviewed at length with the patient today.  The patient concerns regarding her medicines were addressed.  The following changes have been made:  No change  Labs/ tests ordered today include:  No orders of the defined types were placed in this encounter.   Recommend 150 minutes/week of aerobic exercise Low fat, low carb, high fiber diet recommended  Disposition:   FU in 1 year   Signed, Larae Grooms, MD  08/12/2019 9:24 AM    [facility] [address], [place], [place]  [address] Phone: [phone]; Fax: [phone]

## 2019-08-12 ENCOUNTER — Telehealth: Payer: Self-pay | Admitting: Rheumatology

## 2019-08-12 ENCOUNTER — Encounter: Payer: Self-pay | Admitting: Interventional Cardiology

## 2019-08-12 ENCOUNTER — Ambulatory Visit: Payer: BC Managed Care – PPO | Admitting: Interventional Cardiology

## 2019-08-12 ENCOUNTER — Other Ambulatory Visit: Payer: Self-pay | Admitting: Internal Medicine

## 2019-08-12 ENCOUNTER — Other Ambulatory Visit: Payer: Self-pay

## 2019-08-12 VITALS — BP 148/82 | HR 72 | Ht 66.0 in | Wt 222.1 lb

## 2019-08-12 DIAGNOSIS — Z8249 Family history of ischemic heart disease and other diseases of the circulatory system: Secondary | ICD-10-CM

## 2019-08-12 DIAGNOSIS — I10 Essential (primary) hypertension: Secondary | ICD-10-CM | POA: Diagnosis not present

## 2019-08-12 DIAGNOSIS — M5136 Other intervertebral disc degeneration, lumbar region: Secondary | ICD-10-CM

## 2019-08-12 DIAGNOSIS — G8929 Other chronic pain: Secondary | ICD-10-CM

## 2019-08-12 DIAGNOSIS — E782 Mixed hyperlipidemia: Secondary | ICD-10-CM | POA: Diagnosis not present

## 2019-08-12 DIAGNOSIS — R002 Palpitations: Secondary | ICD-10-CM

## 2019-08-12 DIAGNOSIS — M5030 Other cervical disc degeneration, unspecified cervical region: Secondary | ICD-10-CM

## 2019-08-12 NOTE — Telephone Encounter (Signed)
Attempted to contact the patient and left message for patient to call the office.  

## 2019-08-12 NOTE — Telephone Encounter (Signed)
Referral placed.

## 2019-08-12 NOTE — Telephone Encounter (Signed)
Patient requesting a referral  for physical therapy to be sent to [facility] for her neck spin and hips. Please advise.

## 2019-08-12 NOTE — Telephone Encounter (Signed)
Patient left a voicemail requesting a referral for physical therapy to be sent to [facility].

## 2019-08-12 NOTE — Telephone Encounter (Signed)
Please give a prescription for physical therapy as requested.

## 2019-08-12 NOTE — Addendum Note (Signed)
Addended by: Carole Binning on: 08/12/2019 04:21 PM   Modules accepted: Orders

## 2019-08-12 NOTE — Patient Instructions (Addendum)
Medication Instructions:  Your physician recommends that you continue on your current medications as directed. Please refer to the Current Medication list given to you today.  *If you need a refill on your cardiac medications before your next appointment, please call your pharmacy*   Lab Work: None  If you have labs (blood work) drawn today and your tests are completely normal, you will receive your results only by: . MyChart Message (if you have MyChart) OR . A paper copy in the mail If you have any lab test that is abnormal or we need to change your treatment, we will call you to review the results.   Testing/Procedures: None   Follow-Up: At [facility], you and your health needs are our priority.  As part of our continuing mission to provide you with exceptional heart care, we have created designated Provider Care Teams.  These Care Teams include your primary Cardiologist (physician) and Advanced Practice Providers (APPs -  Physician Assistants and Nurse Practitioners) who all work together to provide you with the care you need, when you need it.  We recommend signing up for the patient portal called "MyChart".  Sign up information is provided on this After Visit Summary.  MyChart is used to connect with patients for Virtual Visits (Telemedicine).  Patients are able to view lab/test results, encounter notes, upcoming appointments, etc.  Non-urgent messages can be sent to your provider as well.   To learn more about what you can do with MyChart, go to [email or web address].    Your next appointment:   12 month(s)  The format for your next appointment:   In Person  Provider:   You may see Jay Varanasi, MD or one of the following Advanced Practice Providers on your designated Care Team:    Dayna Dunn, PA-C  Michele Lenze, PA-C    Other Instructions  High-Fiber Diet Fiber, also called dietary fiber, is a type of carbohydrate that is found in fruits, vegetables, whole  grains, and beans. A high-fiber diet can have many health benefits. Your health care provider may recommend a high-fiber diet to help:  Prevent constipation. Fiber can make your bowel movements more regular.  Lower your cholesterol.  Relieve the following conditions: ? Swelling of veins in the anus (hemorrhoids). ? Swelling and irritation (inflammation) of specific areas of the digestive tract (uncomplicated diverticulosis). ? A problem of the large intestine (colon) that sometimes causes pain and diarrhea (irritable bowel syndrome, IBS).  Prevent overeating as part of a weight-loss plan.  Prevent heart disease, type 2 diabetes, and certain cancers. What is my plan? The recommended daily fiber intake in grams (g) includes:  38 g for men age 50 or younger.  30 g for men over age 50.  25 g for women age 50 or younger.  21 g for women over age 50. You can get the recommended daily intake of dietary fiber by:  Eating a variety of fruits, vegetables, grains, and beans.  Taking a fiber supplement, if it is not possible to get enough fiber through your diet. What do I need to know about a high-fiber diet?  It is better to get fiber through food sources rather than from fiber supplements. There is not a lot of research about how effective supplements are.  Always check the fiber content on the nutrition facts label of any prepackaged food. Look for foods that contain 5 g of fiber or more per serving.  Talk with a diet and nutrition specialist (dietitian) if   you have questions about specific foods that are recommended or not recommended for your medical condition, especially if those foods are not listed below.  Gradually increase how much fiber you consume. If you increase your intake of dietary fiber too quickly, you may have bloating, cramping, or gas.  Drink plenty of water. Water helps you to digest fiber. What are tips for following this plan?  Eat a wide variety of high-fiber  foods.  Make sure that half of the grains that you eat each day are whole grains.  Eat breads and cereals that are made with whole-grain flour instead of refined flour or white flour.  Eat brown rice, bulgur wheat, or millet instead of white rice.  Start the day with a breakfast that is high in fiber, such as a cereal that contains 5 g of fiber or more per serving.  Use beans in place of meat in soups, salads, and pasta dishes.  Eat high-fiber snacks, such as berries, raw vegetables, nuts, and popcorn.  Choose whole fruits and vegetables instead of processed forms like juice or sauce. What foods can I eat?  Fruits Berries. Pears. Apples. Oranges. Avocado. Prunes and raisins. Dried figs. Vegetables Sweet potatoes. Spinach. Kale. Artichokes. Cabbage. Broccoli. Cauliflower. Green peas. Carrots. Squash. Grains Whole-grain breads. Multigrain cereal. Oats and oatmeal. Brown rice. Barley. Bulgur wheat. Millet. Quinoa. Bran muffins. Popcorn. Rye wafer crackers. Meats and other proteins Navy, kidney, and pinto beans. Soybeans. Split peas. Lentils. Nuts and seeds. Dairy Fiber-fortified yogurt. Beverages Fiber-fortified soy milk. Fiber-fortified orange juice. Other foods Fiber bars. The items listed above may not be a complete list of recommended foods and beverages. Contact a dietitian for more options. What foods are not recommended? Fruits Fruit juice. Cooked, strained fruit. Vegetables Fried potatoes. Canned vegetables. Well-cooked vegetables. Grains White bread. Pasta made with refined flour. White rice. Meats and other proteins Fatty cuts of meat. Fried chicken or fried fish. Dairy Milk. Yogurt. Cream cheese. Sour cream. Fats and oils Butters. Beverages Soft drinks. Other foods Cakes and pastries. The items listed above may not be a complete list of foods and beverages to avoid. Contact a dietitian for more information. Summary  Fiber is a type of carbohydrate. It is  found in fruits, vegetables, whole grains, and beans.  There are many health benefits of eating a high-fiber diet, such as preventing constipation, lowering blood cholesterol, helping with weight loss, and reducing your risk of heart disease, diabetes, and certain cancers.  Gradually increase your intake of fiber. Increasing too fast can result in cramping, bloating, and gas. Drink plenty of water while you increase your fiber.  The best sources of fiber include whole fruits and vegetables, whole grains, nuts, seeds, and beans. This information is not intended to replace advice given to you by your health care provider. Make sure you discuss any questions you have with your health care provider. Document Revised: 12/08/2016 Document Reviewed: 12/08/2016 Elsevier Patient Education  2020 [employer] Inc.   

## 2019-08-16 ENCOUNTER — Telehealth: Payer: Self-pay | Admitting: Internal Medicine

## 2019-08-16 MED ORDER — NEXIUM 40 MG PO CPDR: 40.0000 mg | DELAYED_RELEASE_CAPSULE | Freq: Two times a day (BID) | ORAL | 0 refills | Status: DC

## 2019-08-16 NOTE — Telephone Encounter (Signed)
I spoke to [facility] and she said digestive health just deals with her liver issues . We updated her pharmacy and resent the Nexium as requested.

## 2019-08-16 NOTE — Telephone Encounter (Signed)
I left her a message to call me back , I wanted to know is she seeing both [place] and [facility]?

## 2019-08-24 DIAGNOSIS — M5136 Other intervertebral disc degeneration, lumbar region: Secondary | ICD-10-CM | POA: Diagnosis not present

## 2019-08-24 DIAGNOSIS — M533 Sacrococcygeal disorders, not elsewhere classified: Secondary | ICD-10-CM | POA: Diagnosis not present

## 2019-08-24 DIAGNOSIS — M797 Fibromyalgia: Secondary | ICD-10-CM | POA: Diagnosis not present

## 2019-08-24 DIAGNOSIS — M5030 Other cervical disc degeneration, unspecified cervical region: Secondary | ICD-10-CM | POA: Diagnosis not present

## 2019-08-26 DIAGNOSIS — M797 Fibromyalgia: Secondary | ICD-10-CM | POA: Diagnosis not present

## 2019-08-26 DIAGNOSIS — M533 Sacrococcygeal disorders, not elsewhere classified: Secondary | ICD-10-CM | POA: Diagnosis not present

## 2019-08-26 DIAGNOSIS — M5136 Other intervertebral disc degeneration, lumbar region: Secondary | ICD-10-CM | POA: Diagnosis not present

## 2019-08-26 DIAGNOSIS — M5030 Other cervical disc degeneration, unspecified cervical region: Secondary | ICD-10-CM | POA: Diagnosis not present

## 2019-08-30 DIAGNOSIS — J329 Chronic sinusitis, unspecified: Secondary | ICD-10-CM | POA: Diagnosis not present

## 2019-08-30 DIAGNOSIS — H6983 Other specified disorders of Eustachian tube, bilateral: Secondary | ICD-10-CM | POA: Diagnosis not present

## 2019-08-30 DIAGNOSIS — J40 Bronchitis, not specified as acute or chronic: Secondary | ICD-10-CM | POA: Diagnosis not present

## 2019-09-05 DIAGNOSIS — F418 Other specified anxiety disorders: Secondary | ICD-10-CM | POA: Diagnosis not present

## 2019-09-05 DIAGNOSIS — J40 Bronchitis, not specified as acute or chronic: Secondary | ICD-10-CM | POA: Diagnosis not present

## 2019-09-06 DIAGNOSIS — M797 Fibromyalgia: Secondary | ICD-10-CM | POA: Diagnosis not present

## 2019-09-06 DIAGNOSIS — M5136 Other intervertebral disc degeneration, lumbar region: Secondary | ICD-10-CM | POA: Diagnosis not present

## 2019-09-06 DIAGNOSIS — M5030 Other cervical disc degeneration, unspecified cervical region: Secondary | ICD-10-CM | POA: Diagnosis not present

## 2019-09-06 DIAGNOSIS — M533 Sacrococcygeal disorders, not elsewhere classified: Secondary | ICD-10-CM | POA: Diagnosis not present

## 2019-09-07 DIAGNOSIS — L570 Actinic keratosis: Secondary | ICD-10-CM | POA: Diagnosis not present

## 2019-09-15 DIAGNOSIS — M5030 Other cervical disc degeneration, unspecified cervical region: Secondary | ICD-10-CM | POA: Diagnosis not present

## 2019-09-15 DIAGNOSIS — M797 Fibromyalgia: Secondary | ICD-10-CM | POA: Diagnosis not present

## 2019-09-15 DIAGNOSIS — M5136 Other intervertebral disc degeneration, lumbar region: Secondary | ICD-10-CM | POA: Diagnosis not present

## 2019-09-15 DIAGNOSIS — M533 Sacrococcygeal disorders, not elsewhere classified: Secondary | ICD-10-CM | POA: Diagnosis not present

## 2019-09-16 DIAGNOSIS — E669 Obesity, unspecified: Secondary | ICD-10-CM | POA: Diagnosis not present

## 2019-09-16 DIAGNOSIS — Z888 Allergy status to other drugs, medicaments and biological substances status: Secondary | ICD-10-CM | POA: Diagnosis not present

## 2019-09-16 DIAGNOSIS — E8881 Metabolic syndrome: Secondary | ICD-10-CM | POA: Diagnosis not present

## 2019-09-16 DIAGNOSIS — Z882 Allergy status to sulfonamides status: Secondary | ICD-10-CM | POA: Diagnosis not present

## 2019-09-16 DIAGNOSIS — K760 Fatty (change of) liver, not elsewhere classified: Secondary | ICD-10-CM | POA: Diagnosis not present

## 2019-09-16 DIAGNOSIS — Z6834 Body mass index (BMI) 34.0-34.9, adult: Secondary | ICD-10-CM | POA: Diagnosis not present

## 2019-09-16 DIAGNOSIS — Z881 Allergy status to other antibiotic agents status: Secondary | ICD-10-CM | POA: Diagnosis not present

## 2019-09-20 DIAGNOSIS — L821 Other seborrheic keratosis: Secondary | ICD-10-CM | POA: Diagnosis not present

## 2019-09-20 DIAGNOSIS — L244 Irritant contact dermatitis due to drugs in contact with skin: Secondary | ICD-10-CM | POA: Diagnosis not present

## 2019-09-22 DIAGNOSIS — Z6835 Body mass index (BMI) 35.0-35.9, adult: Secondary | ICD-10-CM | POA: Diagnosis not present

## 2019-09-22 DIAGNOSIS — R7303 Prediabetes: Secondary | ICD-10-CM | POA: Diagnosis not present

## 2019-09-22 DIAGNOSIS — K760 Fatty (change of) liver, not elsewhere classified: Secondary | ICD-10-CM | POA: Diagnosis not present

## 2019-09-22 DIAGNOSIS — E669 Obesity, unspecified: Secondary | ICD-10-CM | POA: Diagnosis not present

## 2019-09-27 DIAGNOSIS — N3281 Overactive bladder: Secondary | ICD-10-CM | POA: Diagnosis not present

## 2019-09-29 ENCOUNTER — Ambulatory Visit: Payer: BC Managed Care – PPO | Admitting: Internal Medicine

## 2019-09-29 ENCOUNTER — Encounter: Payer: Self-pay | Admitting: Internal Medicine

## 2019-09-29 VITALS — BP 118/76 | HR 83 | Ht 66.0 in | Wt 213.0 lb

## 2019-09-29 DIAGNOSIS — K760 Fatty (change of) liver, not elsewhere classified: Secondary | ICD-10-CM

## 2019-09-29 DIAGNOSIS — M533 Sacrococcygeal disorders, not elsewhere classified: Secondary | ICD-10-CM | POA: Diagnosis not present

## 2019-09-29 DIAGNOSIS — M5030 Other cervical disc degeneration, unspecified cervical region: Secondary | ICD-10-CM | POA: Diagnosis not present

## 2019-09-29 DIAGNOSIS — K219 Gastro-esophageal reflux disease without esophagitis: Secondary | ICD-10-CM | POA: Diagnosis not present

## 2019-09-29 DIAGNOSIS — M797 Fibromyalgia: Secondary | ICD-10-CM | POA: Diagnosis not present

## 2019-09-29 DIAGNOSIS — M5136 Other intervertebral disc degeneration, lumbar region: Secondary | ICD-10-CM | POA: Diagnosis not present

## 2019-09-29 DIAGNOSIS — Z6379 Other stressful life events affecting family and household: Secondary | ICD-10-CM

## 2019-09-29 DIAGNOSIS — E669 Obesity, unspecified: Secondary | ICD-10-CM | POA: Insufficient documentation

## 2019-09-29 MED ORDER — NEXIUM 40 MG PO CPDR: 40.0000 mg | DELAYED_RELEASE_CAPSULE | Freq: Two times a day (BID) | ORAL | 3 refills | Status: DC

## 2019-09-29 NOTE — Progress Notes (Signed)
Tracey Rodriguez 59 y.o. 1960/11/27 [id number]  Assessment & Plan:   Encounter Diagnoses  Name Primary?  . Gastroesophageal reflux disease, unspecified whether esophagitis present Yes  . Obesity (BMI 30.0-34.9)   . Stress due to illness of family member   . NAFLD (nonalcoholic fatty liver disease)     Refill Nexium she will try to taper off the second dose.  Fairly extensive discussion about trying low-carb restricted feeding and intermittent fasting for weight loss.  She has had some success with what she is doing but would prefer greater weight loss.  Situational stress undoubtedly adding to that I stress hormones make weight loss more difficult.  See after visit summary for recommendations regarding changing eating habits and losing weight.  I think she be a good candidate for low-carb with intermittent fasting/restricted feeding.  Consider using online fasting coaches.  Dr. Leeanne Rio or Dr. Luis Abed.  CC: Aura Dials, MD   Subjective:   Chief Complaint: Follow-up of GERD  HPI [name] is here for follow-up and refill on her Nexium.  She has been on twice daily Nexium for some time with control of her reflux.  She wonders if she can try to reduce that.  Previously had had hoarseness.  She also has a history of a rectocele and some defecation difficulties and has seen urogynecology and is willing to have surgery but timing of that is not good now and she has had several other issues in the family going on plus Covid etc. so she intends to do that at some point but not yet.  We reviewed that she is being seen by bariatric clinic at [facility] she has been sent to hepatology there where she was evaluated for fatty liver.  She has had some weight loss with various pharmacologic treatments that include phentermine and Qsymia.  Trying caloric restriction and higher protein and fiber.  She has had success in stopping sugary sodas.  She actually had a liver biopsy with mild  macrosteatosis but no significant inflammation or fibrosis in July 2020.  Family life is difficult as she says her husband has become an alcoholic in the last decade and that is quite stressful.  Daughter and son-in-law and infant were living with them for a year as well.  She has a developmentally delayed brother whom she is responsible for.  Wt Readings from Last 3 Encounters:  09/29/19 213 lb (96.6 kg)  08/12/19 222 lb 1.9 oz (100.8 kg)  04/20/19 219 lb (99.3 kg)   October 2019 had a 98.975 kg weight Allergies  Allergen Reactions  . Capsaicin Anaphylaxis    Pt not sure of name   . Etodolac Shortness Of Breath    REACTION: dyspnea/tightness in chest  . Bee Venom   . Crestor [Rosuvastatin] Itching    All statins   . Doxycycline Nausea And Vomiting  . Pepto-Bismol [Bismuth] Nausea And Vomiting  . Pork-Derived Products Nausea And Vomiting  . Sulfonamide Derivatives Nausea And Vomiting    REACTION: vomiting   Current Meds  Medication Sig  . albuterol (PROVENTIL HFA;VENTOLIN HFA) 108 (90 BASE) MCG/ACT inhaler Inhale 2 puffs into the lungs every 6 (six) hours as needed for wheezing or shortness of breath.  Marland Kitchen b complex vitamins tablet Take by mouth. Use asdirected  . Biotin 5000 MCG CAPS Take 5,000 mcg by mouth daily.  . Carboxymethylcellulose Sodium (EYE DROPS OP) Apply to eye 2 (two) times daily.  . cetirizine (ZYRTEC) 10 MG tablet Take 10 mg by  mouth as needed.   . diclofenac sodium (VOLTAREN) 1 % GEL Apply 1 application topically daily as needed (arthritis).  . Doxycycline Hyclate (DORYX) 200 MG TBEC Take 200 mg by mouth daily as needed (skin inflammation).  Marland Kitchen doxylamine, Sleep, (UNISOM) 25 MG tablet Take 25 mg by mouth at bedtime as needed.  Marland Kitchen EPINEPHrine (EPIPEN JR) 0.15 MG/0.3ML injection epinephrine (Jr) 0.15 mg/0.3 mL injection,auto-injector  INJECT AS DIRECTED  . fluocinonide ointment (LIDEX) 1.69 % Apply 1 application topically as needed.   . furosemide (LASIX) 20 MG  tablet Take 1 tablet (20 mg total) by mouth daily as needed for fluid or edema.  . hydrocortisone (ANUSOL-HC) 2.5 % rectal cream Place 1 application rectally 2 (two) times daily. (Patient taking differently: Place 1 application rectally as needed. )  . levothyroxine (SYNTHROID, LEVOTHROID) 50 MCG tablet Take 50 mcg by mouth daily.  Marland Kitchen MAGNESIUM PO Take by mouth.  . methocarbamol (ROBAXIN) 500 MG tablet TAKE 1 TABLET(500 MG) BY MOUTH TWICE DAILY AS NEEDED FOR MUSCLE SPASMS  . metoprolol succinate (TOPROL-XL) 50 MG 24 hr tablet Take 1 tablet (50 mg total) by mouth daily. Take with or immediately following a meal. Please make yearly appt with Dr. Irish Lack for August before anymore refills. 1st attempt  . Multiple Vitamins-Minerals (MULTIVITAMIN PO) Take by mouth daily.  . mupirocin ointment (BACTROBAN) 2 % Place 1 application into the nose daily as needed (blood blisters).  Marland Kitchen NEXIUM 40 MG capsule Take 1 capsule (40 mg total) by mouth 2 (two) times daily before a meal.  . OVER THE COUNTER MEDICATION Take 2 capsules by mouth daily. Med Name: STRESS B COMPLEX CAPSULE  . QSYMIA 7.5-46 MG CP24 Take 1 capsule by mouth daily.  Marland Kitchen scopolamine (TRANSDERM-SCOP, 1.5 MG,) 1 MG/3DAYS Transderm-Scop 1.5 mg transdermal patch (1 mg over 3 days)  . solifenacin (VESICARE) 10 MG tablet Take by mouth.  . TURMERIC PO Take by mouth daily.  . [DISCONTINUED] mirabegron ER (MYRBETRIQ) 50 MG TB24 Take 50 mg by mouth daily.  . [DISCONTINUED] NEXIUM 40 MG capsule Take 1 capsule (40 mg total) by mouth 2 (two) times daily before a meal.   Past Medical History:  Diagnosis Date  . Arthritis    deg disc disease  - lower back, neck  . Cancer ([place])    left breast surgery-lumpectomy  . Disorder of vocal cord   . Factor V deficiency ([place])   . Fibromyalgia   . GERD (gastroesophageal reflux disease)   . Heart murmur   . Hx of adenomatous polyp of colon 02/04/2018  . Hyperlipidemia   . Hypothyroidism    goiter  . NAFLD  (nonalcoholic fatty liver disease)   . Neuromuscular disorder ([place])    sciatic nerve  . PONV (postoperative nausea and vomiting)   . Rectocele 01/27/2018  . SVD (spontaneous vaginal delivery)    x 2   Past Surgical History:  Procedure Laterality Date  . ABLATION    . APPENDECTOMY    . BREAST EXCISIONAL BIOPSY Left   . BREAST SURGERY     left- lumpectomy  . DILATATION & CURRETTAGE/HYSTEROSCOPY WITH RESECTOCOPE N/A 04/05/2012   Procedure: hysteroscopy with endocervical curretting;  Surgeon: Allyn Kenner, DO;  Location: [facility];  Service: Gynecology;  Laterality: N/A;  . DILATION AND CURETTAGE OF UTERUS    . EYE SURGERY Bilateral 10/2017   cataract extraction   . LIVER BIOPSY  09/2018  . NOSE SURGERY     Social History   Social History  Narrative   She is married, has children.  They are grown.  1 son and 1 daughter has at least 1 grandchild   Former smoker no significant alcohol no drug use   family history includes Breast cancer (age of onset: 90) in her maternal aunt; Breast cancer (age of onset: 56) in her mother; Cancer in her brother; Clotting disorder in her father; Colon polyps in her brother and brother; Crohn's disease in her father; Heart attack in her brother and father; Heart disease in her maternal grandfather and paternal aunt; Heart disease (age of onset: 88) in her father; Irritable bowel syndrome in her son; Kidney disease in her paternal uncle; Liver cancer in her maternal grandmother; Lung cancer in her brother and maternal grandfather; Ovarian cancer in her mother; Rheumatologic disease in her father; Stroke in her paternal aunt.   Review of Systems As above Fibromyalgia pains back pain significant issues Objective:   Physical Exam BP 118/76   Pulse 83   Ht 5\' 6"  (1.676 m)   Wt 213 lb (96.6 kg)   BMI 34.38 kg/m  Obese pleasant middle-aged white woman in no acute distress  Total time 34 minutes

## 2019-09-29 NOTE — Patient Instructions (Signed)
Good to see you today.  You can try to reduce the Nexium by slowly coming off the night dose.  As far as weight loss I recommend you consider low carb and restricted feeding/intermittent fasting.  Check out the following:  Dietdoctor.com  Google search these  The Fasting Way - Dr. Sharman Cheek  [facility] Dr. Enrigue Catena  Consider reading The Obesity Code by dr. Sharman Cheek  I hope this helps.  Tracey Rodriguez!  Tracey Mayer, MD, [facility]

## 2019-10-05 DIAGNOSIS — L821 Other seborrheic keratosis: Secondary | ICD-10-CM | POA: Diagnosis not present

## 2019-10-05 DIAGNOSIS — L814 Other melanin hyperpigmentation: Secondary | ICD-10-CM | POA: Diagnosis not present

## 2019-10-05 DIAGNOSIS — D225 Melanocytic nevi of trunk: Secondary | ICD-10-CM | POA: Diagnosis not present

## 2019-10-05 DIAGNOSIS — D1801 Hemangioma of skin and subcutaneous tissue: Secondary | ICD-10-CM | POA: Diagnosis not present

## 2019-10-12 NOTE — Progress Notes (Signed)
Office Visit Note  Patient: Tracey Rodriguez             Date of Birth: 05-31-1960           MRN: [id number]             PCP: Aura Dials, MD Referring: Aura Dials, MD Visit Date: 10/25/2019 Occupation: @GUAROCC @  Subjective:  Other (patient requested SI joint injection )   History of Present Illness: Tracey Rodriguez is a 59 y.o. female with history of fibromyalgia, osteoarthritis and degenerative disc disease.  She states she had been experiencing increased SI joint pain recently.  She has difficulty walking and also has nocturnal pain due to SI joint discomfort.  She continues to have some discomfort in her cervical and lumbar spine due to underlying disc disease.  She also has some discomfort over bilateral trochanteric bursa.  Activities of Daily Living:  Patient reports morning stiffness for 20-30  minutes.   Patient Reports nocturnal pain.  Difficulty dressing/grooming: Reports Difficulty climbing stairs: Reports Difficulty getting out of chair: Reports Difficulty using hands for taps, buttons, cutlery, and/or writing: Reports  Review of Systems  Constitutional: Positive for fatigue.  HENT: Positive for mouth dryness and nose dryness. Negative for mouth sores.   Eyes: Positive for dryness.  Respiratory: Positive for shortness of breath and difficulty breathing.   Cardiovascular: Negative for chest pain and palpitations.  Gastrointestinal: Negative for blood in stool, constipation and diarrhea.  Endocrine: Negative for increased urination.  Genitourinary: Negative for difficulty urinating.  Musculoskeletal: Positive for arthralgias, joint pain, joint swelling, myalgias, muscle weakness, morning stiffness, muscle tenderness and myalgias.  Skin: Positive for color change and rash.  Allergic/Immunologic: Negative for susceptible to infections.  Neurological: Positive for numbness. Negative for dizziness, headaches, memory loss and weakness.  Hematological:  Positive for bruising/bleeding tendency.  Psychiatric/Behavioral: Negative for confusion.    PMFS History:  Patient Active Problem List   Diagnosis Date Noted  . NAFLD (nonalcoholic fatty liver disease) 09/29/2019  . Obesity (BMI 30.0-34.9) 09/29/2019  . Hyperlipidemia 09/09/2018  . Hx of adenomatous polyp of colon 02/04/2018  . Rectocele 01/27/2018  . Change in bowel habits 01/05/2018  . Hemorrhoids 01/05/2018  . Hoarseness 07/30/2017  . Gastroesophageal reflux disease 07/30/2017  . History of breast cancer 09/11/2016  . Other fatigue 03/15/2016  . Primary insomnia 03/15/2016  . Primary osteoarthritis of both hands 03/15/2016  . DDD cervical spine 03/15/2016  . 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 History of bilateral carpal tunnel release 03/15/2016  . Unspecified hypothyroidism 03/30/2013  . Fibromyalgia 03/30/2013  . Breast cancer ([place]) 03/30/2013  . Factor V deficiency ([place]) 03/30/2013  . Dyspnea 02/02/2013  . COPD (chronic obstructive pulmonary disease) ([place]) 02/02/2013  . Restrictive lung disease 02/02/2013    Past Medical History:  Diagnosis Date  . Arthritis    deg disc disease  - lower back, neck  . Cancer ([place])    left breast surgery-lumpectomy  . Disorder of vocal cord   . Factor V deficiency ([place])   . Fibromyalgia   . GERD (gastroesophageal reflux disease)   . Heart murmur   . Hx of adenomatous polyp of colon 02/04/2018  . Hyperlipidemia   . Hypothyroidism    goiter  . NAFLD (nonalcoholic fatty liver disease)   . Neuromuscular disorder ([place])    sciatic nerve  . PONV (postoperative nausea and vomiting)   . Rectocele 01/27/2018  . SVD (spontaneous vaginal delivery)    x 2  Family History  Problem Relation Age of Onset  . Heart disease Father 56  . Heart attack Father   . Clotting disorder Father   . Rheumatologic disease Father   . Crohn's disease Father   . Heart disease Maternal Grandfather   . Lung cancer Maternal Grandfather   .  Breast cancer Mother 78  . Ovarian cancer Mother   . Liver cancer Maternal Grandmother   . Heart attack Brother   . Cancer Brother   . Lung cancer Brother   . Colon polyps Brother   . Colon polyps Brother   . Stroke Brother   . Breast cancer Maternal Aunt 60  . Kidney disease Paternal Uncle   . Irritable bowel syndrome Son   . Stroke Paternal Aunt   . Heart disease Paternal Aunt    Past Surgical History:  Procedure Laterality Date  . ABLATION    . APPENDECTOMY    . BREAST EXCISIONAL BIOPSY Left   . BREAST SURGERY     left- lumpectomy  . DILATATION & CURRETTAGE/HYSTEROSCOPY WITH RESECTOCOPE N/A 04/05/2012   Procedure: hysteroscopy with endocervical curretting;  Surgeon: Allyn Kenner, DO;  Location: [facility];  Service: Gynecology;  Laterality: N/A;  . DILATION AND CURETTAGE OF UTERUS    . EYE SURGERY Bilateral 10/2017   cataract extraction   . LIVER BIOPSY  09/2018  . NOSE SURGERY     Social History   Social History Narrative   She is married, has children.  They are grown.  1 son and 1 daughter has at least 1 grandchild   Former smoker no significant alcohol no drug use   Immunization History  Administered Date(s) Administered  . Influenza Split 11/17/2012  . Influenza-Unspecified 11/24/2016  . PFIZER SARS-COV-2 Vaccination 04/27/2019, 05/18/2019     Objective: Vital Signs: BP 132/84 (BP Location: Left Arm, Patient Position: Sitting, Cuff Size: Normal)   Pulse 67   Resp 14   Ht 5\' 6"  (1.676 m)   Wt 208 lb 12.8 oz (94.7 kg)   BMI 33.70 kg/m    Physical Exam Vitals and nursing note reviewed.  Constitutional:      Appearance: She is well-developed.  HENT:     Head: Normocephalic and atraumatic.  Eyes:     Conjunctiva/sclera: Conjunctivae normal.  Cardiovascular:     Rate and Rhythm: Normal rate and regular rhythm.     Heart sounds: Normal heart sounds.  Pulmonary:     Effort: Pulmonary effort is normal.     Breath sounds: Normal breath sounds.  Abdominal:      General: Bowel sounds are normal.     Palpations: Abdomen is soft.  Musculoskeletal:     Cervical back: Normal range of motion.  Lymphadenopathy:     Cervical: No cervical adenopathy.  Skin:    General: Skin is warm and dry.     Capillary Refill: Capillary refill takes less than 2 seconds.  Neurological:     Mental Status: She is alert and oriented to person, place, and time.  Psychiatric:        Behavior: Behavior normal.      Musculoskeletal Exam: C-spine was in good range of motion.  She had bilateral trapezius spasm.  She is in thoracic kyphosis.  She has discomfort range of motion of her lumbar spine.  She had tenderness over bilateral SI joints.  Shoulder joints, elbow joints, wrist joints with good range of motion.  She has bilateral DIP thickening with no synovitis.  Hip joints,  knee joints, ankles with good range of motion.  No MTP tenderness was noted.  CDAI Exam: CDAI Score: -- Patient Global: --; Provider Global: -- Swollen: --; Tender: -- Joint Exam 10/25/2019   No joint exam has been documented for this visit   There is currently no information documented on the homunculus. Go to the Rheumatology activity and complete the homunculus joint exam.  Investigation: No additional findings.  Imaging: No results found.  Recent Labs: Lab Results  Component Value Date   WBC 8.1 09/08/2018   HGB 13.6 09/08/2018   PLT 307 09/08/2018   NA 143 09/08/2018   K 4.5 09/08/2018   CL 102 09/08/2018   CO2 25 09/08/2018   GLUCOSE 105 (H) 09/08/2018   BUN 17 09/08/2018   CREATININE 0.89 09/08/2018   BILITOT 0.5 09/29/2017   ALKPHOS 72 03/27/2013   AST 23 09/29/2017   ALT 26 09/29/2017   PROT 7.1 09/29/2017   PROT 7.2 09/29/2017   ALBUMIN 4.4 03/27/2013   CALCIUM 9.4 09/08/2018   GFRAA 83 09/08/2018    Speciality Comments: No specialty comments available.  Procedures:  Sacroiliac Joint Inj on 10/25/2019 10:04 AM Indications: pain Details: 27 G 1.5 in needle,  posterior approach Medications (Right): 1 mL lidocaine 1 %; 40 mg triamcinolone acetonide 40 MG/ML Medications (Left): 1 mL lidocaine 1 %; 40 mg triamcinolone acetonide 40 MG/ML Outcome: tolerated well, no immediate complications Procedure, treatment alternatives, risks and benefits explained, specific risks discussed. Consent was given by the patient. Immediately prior to procedure a time out was called to verify the correct patient, procedure, equipment, support staff and site/side marked as required. Patient was prepped and draped in the usual sterile fashion.     Allergies: Capsaicin, Etodolac, Bee venom, Crestor [rosuvastatin], Doxycycline, Pepto-bismol [bismuth], Pork-derived products, and Sulfonamide derivatives   Assessment / Plan:     Visit Diagnoses: Fibromyalgia-she continues to have some generalized pain and discomfort.  Need for regular exercise and stretching was discussed.  She takes methocarbamol on as needed basis.  Side effects of methocarbamol were reviewed.  Other fatigue-related to fibromyalgia and insomnia.  Primary insomnia-good sleep hygiene was discussed.  Neck pain-she has been having bilateral trapezius spasm.  Neck exercises were demonstrated.  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  She describes difficulty walking and nocturnal pain.  Per her request after different treatment options were discussed and side effects were reviewed bilateral SI joints were injected with cortisone as described above.  Primary osteoarthritis of both hands-joint protection was discussed.  History of bilateral carpal tunnel release  DDD (degenerative disc disease), cervical-she has chronic pain.  DDD (degenerative disc disease), lumbar-chronic pain  Factor V deficiency ([place])  History of breast cancer  History of COPD  Osteoporosis screening-patient states that she had DEXA done by her PCP several years ago which showed osteopenia.  Have advised her to get a repeat  DEXA through her PCP.  Orders: Orders Placed This Encounter  Procedures  . Sacroiliac Joint Inj   No orders of the defined types were placed in this encounter.   .  Follow-Up Instructions: Return in about 6 months (around 04/23/2020) for FMS, Osteoarthritis.   Bo Merino, MD  Note - This record has been created using Editor, commissioning.  Chart creation errors have been sought, but may not always  have been located. Such creation errors do not reflect on  the standard of medical care.

## 2019-10-25 ENCOUNTER — Ambulatory Visit (INDEPENDENT_AMBULATORY_CARE_PROVIDER_SITE_OTHER): Payer: BC Managed Care – PPO | Admitting: Rheumatology

## 2019-10-25 ENCOUNTER — Encounter: Payer: Self-pay | Admitting: Rheumatology

## 2019-10-25 ENCOUNTER — Other Ambulatory Visit: Payer: Self-pay

## 2019-10-25 VITALS — BP 132/84 | HR 67 | Resp 14 | Ht 66.0 in | Wt 208.8 lb

## 2019-10-25 DIAGNOSIS — M542 Cervicalgia: Secondary | ICD-10-CM

## 2019-10-25 DIAGNOSIS — F5101 Primary insomnia: Secondary | ICD-10-CM | POA: Diagnosis not present

## 2019-10-25 DIAGNOSIS — M533 Sacrococcygeal disorders, not elsewhere classified: Secondary | ICD-10-CM | POA: Diagnosis not present

## 2019-10-25 DIAGNOSIS — G8929 Other chronic pain: Secondary | ICD-10-CM | POA: Diagnosis not present

## 2019-10-25 DIAGNOSIS — M797 Fibromyalgia: Secondary | ICD-10-CM | POA: Diagnosis not present

## 2019-10-25 DIAGNOSIS — M19042 Primary osteoarthritis, left hand: Secondary | ICD-10-CM

## 2019-10-25 DIAGNOSIS — M5030 Other cervical disc degeneration, unspecified cervical region: Secondary | ICD-10-CM

## 2019-10-25 DIAGNOSIS — M5136 Other intervertebral disc degeneration, lumbar region: Secondary | ICD-10-CM

## 2019-10-25 DIAGNOSIS — R5383 Other fatigue: Secondary | ICD-10-CM | POA: Diagnosis not present

## 2019-10-25 DIAGNOSIS — Z8709 Personal history of other diseases of the respiratory system: Secondary | ICD-10-CM

## 2019-10-25 DIAGNOSIS — D682 Hereditary deficiency of other clotting factors: Secondary | ICD-10-CM

## 2019-10-25 DIAGNOSIS — Z9889 Other specified postprocedural states: Secondary | ICD-10-CM

## 2019-10-25 DIAGNOSIS — M19041 Primary osteoarthritis, right hand: Secondary | ICD-10-CM

## 2019-10-25 DIAGNOSIS — Z13820 Encounter for screening for osteoporosis: Secondary | ICD-10-CM

## 2019-10-25 DIAGNOSIS — Z853 Personal history of malignant neoplasm of breast: Secondary | ICD-10-CM

## 2019-10-25 MED ORDER — LIDOCAINE HCL 1 % IJ SOLN
1.0000 mL | INTRAMUSCULAR | Status: AC | PRN
  Administered 2019-10-25: 1 mL

## 2019-10-25 MED ORDER — TRIAMCINOLONE ACETONIDE 40 MG/ML IJ SUSP
40.0000 mg | INTRAMUSCULAR | Status: AC | PRN
  Administered 2019-10-25: 40 mg via INTRA_ARTICULAR

## 2019-10-26 DIAGNOSIS — M797 Fibromyalgia: Secondary | ICD-10-CM | POA: Diagnosis not present

## 2019-10-26 DIAGNOSIS — M5030 Other cervical disc degeneration, unspecified cervical region: Secondary | ICD-10-CM | POA: Diagnosis not present

## 2019-10-26 DIAGNOSIS — M533 Sacrococcygeal disorders, not elsewhere classified: Secondary | ICD-10-CM | POA: Diagnosis not present

## 2019-10-26 DIAGNOSIS — M5136 Other intervertebral disc degeneration, lumbar region: Secondary | ICD-10-CM | POA: Diagnosis not present

## 2019-11-01 ENCOUNTER — Telehealth: Payer: Self-pay | Admitting: Interventional Cardiology

## 2019-11-01 NOTE — Telephone Encounter (Signed)
Called and spoke to patient. She states that on Friday and Saturday morning she has been dizzy with position changes and sometimes lying in bed. She states that her head has felt heavy or foggy. Sunday evening she had numbness in her jaw that lasted about 15 mins, no other Sx at that time. She denies chest pain, SOB, palpitations, syncope, changes in vision, speech, or strength, or any other Sx. BP 130/78 HR 69. She states that her Skin MD prescribed niacinamide for precancerous skin cells about a week ago. Made patient aware that niacinamide can cause dizziness. Advised for patient to reach out to her Skin MD. Instructed the patient to let us know if her Sx change or worsen. She verbalized understanding and thanked me for the call.

## 2019-11-01 NOTE — Telephone Encounter (Signed)
New Message   STAT if patient feels like he/she is going to faint   1) Are you dizzy now? no  2) Do you feel faint or have you passed out? no  3) Do you have any other symptoms? Sunday the bottom of the left jaw was numb at times   4) Have you checked your HR and BP (record if available)? BP yesterday when she had the dizzy spell it was 130/78    Patient states that on Friday that she started feeling lightheaded on and off. She thought she may have been dehydrated so she did increase her fluid intake. Yesterday was better but she still felt like her head was heavy

## 2019-11-09 ENCOUNTER — Other Ambulatory Visit: Payer: Self-pay | Admitting: Family Medicine

## 2019-11-09 DIAGNOSIS — M8588 Other specified disorders of bone density and structure, other site: Secondary | ICD-10-CM

## 2019-11-10 DIAGNOSIS — M5136 Other intervertebral disc degeneration, lumbar region: Secondary | ICD-10-CM | POA: Diagnosis not present

## 2019-11-10 DIAGNOSIS — M797 Fibromyalgia: Secondary | ICD-10-CM | POA: Diagnosis not present

## 2019-11-10 DIAGNOSIS — M533 Sacrococcygeal disorders, not elsewhere classified: Secondary | ICD-10-CM | POA: Diagnosis not present

## 2019-11-10 DIAGNOSIS — M5030 Other cervical disc degeneration, unspecified cervical region: Secondary | ICD-10-CM | POA: Diagnosis not present

## 2019-11-13 ENCOUNTER — Other Ambulatory Visit: Payer: Self-pay | Admitting: Internal Medicine

## 2019-11-17 DIAGNOSIS — M8589 Other specified disorders of bone density and structure, multiple sites: Secondary | ICD-10-CM | POA: Diagnosis not present

## 2019-11-28 DIAGNOSIS — M26633 Articular disc disorder of bilateral temporomandibular joint: Secondary | ICD-10-CM | POA: Diagnosis not present

## 2019-12-01 DIAGNOSIS — M899 Disorder of bone, unspecified: Secondary | ICD-10-CM | POA: Diagnosis not present

## 2019-12-01 DIAGNOSIS — Z79899 Other long term (current) drug therapy: Secondary | ICD-10-CM | POA: Diagnosis not present

## 2019-12-01 DIAGNOSIS — Z8262 Family history of osteoporosis: Secondary | ICD-10-CM | POA: Diagnosis not present

## 2019-12-01 DIAGNOSIS — Z87891 Personal history of nicotine dependence: Secondary | ICD-10-CM | POA: Diagnosis not present

## 2019-12-01 DIAGNOSIS — M8580 Other specified disorders of bone density and structure, unspecified site: Secondary | ICD-10-CM | POA: Diagnosis not present

## 2019-12-08 DIAGNOSIS — M5136 Other intervertebral disc degeneration, lumbar region: Secondary | ICD-10-CM | POA: Diagnosis not present

## 2019-12-08 DIAGNOSIS — M5030 Other cervical disc degeneration, unspecified cervical region: Secondary | ICD-10-CM | POA: Diagnosis not present

## 2019-12-08 DIAGNOSIS — M797 Fibromyalgia: Secondary | ICD-10-CM | POA: Diagnosis not present

## 2019-12-08 DIAGNOSIS — M533 Sacrococcygeal disorders, not elsewhere classified: Secondary | ICD-10-CM | POA: Diagnosis not present

## 2019-12-13 DIAGNOSIS — M533 Sacrococcygeal disorders, not elsewhere classified: Secondary | ICD-10-CM | POA: Diagnosis not present

## 2019-12-13 DIAGNOSIS — M5030 Other cervical disc degeneration, unspecified cervical region: Secondary | ICD-10-CM | POA: Diagnosis not present

## 2019-12-13 DIAGNOSIS — M797 Fibromyalgia: Secondary | ICD-10-CM | POA: Diagnosis not present

## 2019-12-13 DIAGNOSIS — M5136 Other intervertebral disc degeneration, lumbar region: Secondary | ICD-10-CM | POA: Diagnosis not present

## 2019-12-20 DIAGNOSIS — M5030 Other cervical disc degeneration, unspecified cervical region: Secondary | ICD-10-CM | POA: Diagnosis not present

## 2019-12-20 DIAGNOSIS — M5136 Other intervertebral disc degeneration, lumbar region: Secondary | ICD-10-CM | POA: Diagnosis not present

## 2019-12-20 DIAGNOSIS — M797 Fibromyalgia: Secondary | ICD-10-CM | POA: Diagnosis not present

## 2019-12-20 DIAGNOSIS — M533 Sacrococcygeal disorders, not elsewhere classified: Secondary | ICD-10-CM | POA: Diagnosis not present

## 2019-12-23 DIAGNOSIS — E6609 Other obesity due to excess calories: Secondary | ICD-10-CM | POA: Diagnosis not present

## 2019-12-23 DIAGNOSIS — Z6832 Body mass index (BMI) 32.0-32.9, adult: Secondary | ICD-10-CM | POA: Diagnosis not present

## 2019-12-23 DIAGNOSIS — E8881 Metabolic syndrome: Secondary | ICD-10-CM | POA: Diagnosis not present

## 2019-12-23 DIAGNOSIS — K760 Fatty (change of) liver, not elsewhere classified: Secondary | ICD-10-CM | POA: Diagnosis not present

## 2019-12-28 DIAGNOSIS — J40 Bronchitis, not specified as acute or chronic: Secondary | ICD-10-CM | POA: Diagnosis not present

## 2020-01-03 DIAGNOSIS — M5030 Other cervical disc degeneration, unspecified cervical region: Secondary | ICD-10-CM | POA: Diagnosis not present

## 2020-01-03 DIAGNOSIS — M797 Fibromyalgia: Secondary | ICD-10-CM | POA: Diagnosis not present

## 2020-01-03 DIAGNOSIS — M533 Sacrococcygeal disorders, not elsewhere classified: Secondary | ICD-10-CM | POA: Diagnosis not present

## 2020-01-03 DIAGNOSIS — M5136 Other intervertebral disc degeneration, lumbar region: Secondary | ICD-10-CM | POA: Diagnosis not present

## 2020-01-09 DIAGNOSIS — L814 Other melanin hyperpigmentation: Secondary | ICD-10-CM | POA: Diagnosis not present

## 2020-01-09 DIAGNOSIS — L578 Other skin changes due to chronic exposure to nonionizing radiation: Secondary | ICD-10-CM | POA: Diagnosis not present

## 2020-01-09 DIAGNOSIS — L565 Disseminated superficial actinic porokeratosis (DSAP): Secondary | ICD-10-CM | POA: Diagnosis not present

## 2020-01-09 DIAGNOSIS — D485 Neoplasm of uncertain behavior of skin: Secondary | ICD-10-CM | POA: Diagnosis not present

## 2020-01-09 DIAGNOSIS — L821 Other seborrheic keratosis: Secondary | ICD-10-CM | POA: Diagnosis not present

## 2020-01-09 DIAGNOSIS — L570 Actinic keratosis: Secondary | ICD-10-CM | POA: Diagnosis not present

## 2020-01-10 DIAGNOSIS — J449 Chronic obstructive pulmonary disease, unspecified: Secondary | ICD-10-CM | POA: Diagnosis not present

## 2020-01-10 DIAGNOSIS — Z1231 Encounter for screening mammogram for malignant neoplasm of breast: Secondary | ICD-10-CM | POA: Diagnosis not present

## 2020-01-20 ENCOUNTER — Other Ambulatory Visit: Payer: Self-pay | Admitting: Family Medicine

## 2020-01-20 DIAGNOSIS — Z1231 Encounter for screening mammogram for malignant neoplasm of breast: Secondary | ICD-10-CM

## 2020-02-01 ENCOUNTER — Telehealth: Payer: Self-pay | Admitting: Interventional Cardiology

## 2020-02-01 DIAGNOSIS — Z23 Encounter for immunization: Secondary | ICD-10-CM | POA: Diagnosis not present

## 2020-02-01 NOTE — Telephone Encounter (Signed)
New message:     Patient calling stating that she has been spinning when she is in bed. Patient calling to ask do she need a CT her brother had a waterfall type of stroke.

## 2020-02-01 NOTE — Telephone Encounter (Signed)
Called and spoke to patient. She states that she had a really bad episode of dizziness with position changes and while lying in bed back in April 2021. She states that her head has felt heavy or foggy. She said that this was the worst episode that she has had. She wasn't sure if she mentioned this at her OV in June but wanted to make [place] aware. She states that she has had another brief episode in September but not as bad as the one in April. She denies chest pain, SOB, palpitations, syncope, changes in vision, speech, or strength, or any other Sx. Instructed the patient to continue to monitor and let us know if her Sx return or worsen. She verbalized understanding and thanked me for the call.

## 2020-02-07 DIAGNOSIS — M5030 Other cervical disc degeneration, unspecified cervical region: Secondary | ICD-10-CM | POA: Diagnosis not present

## 2020-02-07 DIAGNOSIS — M5136 Other intervertebral disc degeneration, lumbar region: Secondary | ICD-10-CM | POA: Diagnosis not present

## 2020-02-07 DIAGNOSIS — M797 Fibromyalgia: Secondary | ICD-10-CM | POA: Diagnosis not present

## 2020-02-07 DIAGNOSIS — M533 Sacrococcygeal disorders, not elsewhere classified: Secondary | ICD-10-CM | POA: Diagnosis not present

## 2020-02-13 DIAGNOSIS — G4719 Other hypersomnia: Secondary | ICD-10-CM | POA: Diagnosis not present

## 2020-02-18 DIAGNOSIS — J019 Acute sinusitis, unspecified: Secondary | ICD-10-CM | POA: Diagnosis not present

## 2020-02-18 DIAGNOSIS — H6691 Otitis media, unspecified, right ear: Secondary | ICD-10-CM | POA: Diagnosis not present

## 2020-02-22 DIAGNOSIS — M797 Fibromyalgia: Secondary | ICD-10-CM | POA: Diagnosis not present

## 2020-02-22 DIAGNOSIS — M5030 Other cervical disc degeneration, unspecified cervical region: Secondary | ICD-10-CM | POA: Diagnosis not present

## 2020-02-22 DIAGNOSIS — M5136 Other intervertebral disc degeneration, lumbar region: Secondary | ICD-10-CM | POA: Diagnosis not present

## 2020-02-22 DIAGNOSIS — M533 Sacrococcygeal disorders, not elsewhere classified: Secondary | ICD-10-CM | POA: Diagnosis not present

## 2020-02-28 DIAGNOSIS — M5136 Other intervertebral disc degeneration, lumbar region: Secondary | ICD-10-CM | POA: Diagnosis not present

## 2020-02-28 DIAGNOSIS — M533 Sacrococcygeal disorders, not elsewhere classified: Secondary | ICD-10-CM | POA: Diagnosis not present

## 2020-02-28 DIAGNOSIS — M797 Fibromyalgia: Secondary | ICD-10-CM | POA: Diagnosis not present

## 2020-02-28 DIAGNOSIS — M5030 Other cervical disc degeneration, unspecified cervical region: Secondary | ICD-10-CM | POA: Diagnosis not present

## 2020-03-01 DIAGNOSIS — L570 Actinic keratosis: Secondary | ICD-10-CM | POA: Diagnosis not present

## 2020-03-12 DIAGNOSIS — R059 Cough, unspecified: Secondary | ICD-10-CM | POA: Diagnosis not present

## 2020-03-12 DIAGNOSIS — J208 Acute bronchitis due to other specified organisms: Secondary | ICD-10-CM | POA: Diagnosis not present

## 2020-03-12 DIAGNOSIS — Z20822 Contact with and (suspected) exposure to covid-19: Secondary | ICD-10-CM | POA: Diagnosis not present

## 2020-03-12 DIAGNOSIS — R0981 Nasal congestion: Secondary | ICD-10-CM | POA: Diagnosis not present

## 2020-03-13 DIAGNOSIS — Z20822 Contact with and (suspected) exposure to covid-19: Secondary | ICD-10-CM | POA: Diagnosis not present

## 2020-03-23 DIAGNOSIS — J449 Chronic obstructive pulmonary disease, unspecified: Secondary | ICD-10-CM | POA: Diagnosis not present

## 2020-03-23 DIAGNOSIS — U071 COVID-19: Secondary | ICD-10-CM | POA: Diagnosis not present

## 2020-03-23 DIAGNOSIS — R059 Cough, unspecified: Secondary | ICD-10-CM | POA: Diagnosis not present

## 2020-03-23 DIAGNOSIS — J0100 Acute maxillary sinusitis, unspecified: Secondary | ICD-10-CM | POA: Diagnosis not present

## 2020-03-27 DIAGNOSIS — M5136 Other intervertebral disc degeneration, lumbar region: Secondary | ICD-10-CM | POA: Diagnosis not present

## 2020-03-27 DIAGNOSIS — M5030 Other cervical disc degeneration, unspecified cervical region: Secondary | ICD-10-CM | POA: Diagnosis not present

## 2020-03-27 DIAGNOSIS — M533 Sacrococcygeal disorders, not elsewhere classified: Secondary | ICD-10-CM | POA: Diagnosis not present

## 2020-03-27 DIAGNOSIS — M797 Fibromyalgia: Secondary | ICD-10-CM | POA: Diagnosis not present

## 2020-03-30 DIAGNOSIS — R0982 Postnasal drip: Secondary | ICD-10-CM | POA: Diagnosis not present

## 2020-03-30 DIAGNOSIS — H6983 Other specified disorders of Eustachian tube, bilateral: Secondary | ICD-10-CM | POA: Diagnosis not present

## 2020-03-30 DIAGNOSIS — R059 Cough, unspecified: Secondary | ICD-10-CM | POA: Diagnosis not present

## 2020-03-30 DIAGNOSIS — J40 Bronchitis, not specified as acute or chronic: Secondary | ICD-10-CM | POA: Diagnosis not present

## 2020-04-03 DIAGNOSIS — K760 Fatty (change of) liver, not elsewhere classified: Secondary | ICD-10-CM | POA: Diagnosis not present

## 2020-04-03 DIAGNOSIS — L578 Other skin changes due to chronic exposure to nonionizing radiation: Secondary | ICD-10-CM | POA: Diagnosis not present

## 2020-04-03 DIAGNOSIS — Z1231 Encounter for screening mammogram for malignant neoplasm of breast: Secondary | ICD-10-CM | POA: Diagnosis not present

## 2020-04-03 DIAGNOSIS — D225 Melanocytic nevi of trunk: Secondary | ICD-10-CM | POA: Diagnosis not present

## 2020-04-03 DIAGNOSIS — R7303 Prediabetes: Secondary | ICD-10-CM | POA: Diagnosis not present

## 2020-04-03 DIAGNOSIS — E8881 Metabolic syndrome: Secondary | ICD-10-CM | POA: Diagnosis not present

## 2020-04-03 DIAGNOSIS — D229 Melanocytic nevi, unspecified: Secondary | ICD-10-CM | POA: Diagnosis not present

## 2020-04-03 DIAGNOSIS — L650 Telogen effluvium: Secondary | ICD-10-CM | POA: Diagnosis not present

## 2020-04-03 DIAGNOSIS — E6609 Other obesity due to excess calories: Secondary | ICD-10-CM | POA: Diagnosis not present

## 2020-04-03 DIAGNOSIS — D1801 Hemangioma of skin and subcutaneous tissue: Secondary | ICD-10-CM | POA: Diagnosis not present

## 2020-04-04 DIAGNOSIS — J3489 Other specified disorders of nose and nasal sinuses: Secondary | ICD-10-CM | POA: Diagnosis not present

## 2020-04-04 DIAGNOSIS — H903 Sensorineural hearing loss, bilateral: Secondary | ICD-10-CM | POA: Diagnosis not present

## 2020-04-04 DIAGNOSIS — J321 Chronic frontal sinusitis: Secondary | ICD-10-CM | POA: Diagnosis not present

## 2020-04-04 DIAGNOSIS — E042 Nontoxic multinodular goiter: Secondary | ICD-10-CM | POA: Diagnosis not present

## 2020-04-04 DIAGNOSIS — R42 Dizziness and giddiness: Secondary | ICD-10-CM | POA: Diagnosis not present

## 2020-04-04 DIAGNOSIS — J342 Deviated nasal septum: Secondary | ICD-10-CM | POA: Diagnosis not present

## 2020-04-10 ENCOUNTER — Emergency Department (HOSPITAL_COMMUNITY)
Admission: EM | Admit: 2020-04-10 | Discharge: 2020-04-11 | Disposition: A | Discharge status: Home / Self Care | Payer: BC Managed Care – PPO | Attending: Emergency Medicine | Admitting: Emergency Medicine

## 2020-04-10 ENCOUNTER — Encounter (HOSPITAL_COMMUNITY): Payer: Self-pay | Admitting: Emergency Medicine

## 2020-04-10 ENCOUNTER — Other Ambulatory Visit: Payer: Self-pay

## 2020-04-10 DIAGNOSIS — Z853 Personal history of malignant neoplasm of breast: Secondary | ICD-10-CM | POA: Diagnosis not present

## 2020-04-10 DIAGNOSIS — H9393 Unspecified disorder of ear, bilateral: Secondary | ICD-10-CM | POA: Insufficient documentation

## 2020-04-10 DIAGNOSIS — R519 Headache, unspecified: Secondary | ICD-10-CM | POA: Insufficient documentation

## 2020-04-10 DIAGNOSIS — R42 Dizziness and giddiness: Secondary | ICD-10-CM | POA: Insufficient documentation

## 2020-04-10 DIAGNOSIS — J449 Chronic obstructive pulmonary disease, unspecified: Secondary | ICD-10-CM | POA: Insufficient documentation

## 2020-04-10 DIAGNOSIS — M5030 Other cervical disc degeneration, unspecified cervical region: Secondary | ICD-10-CM | POA: Diagnosis not present

## 2020-04-10 DIAGNOSIS — E039 Hypothyroidism, unspecified: Secondary | ICD-10-CM | POA: Diagnosis not present

## 2020-04-10 DIAGNOSIS — Z87891 Personal history of nicotine dependence: Secondary | ICD-10-CM | POA: Insufficient documentation

## 2020-04-10 DIAGNOSIS — M797 Fibromyalgia: Secondary | ICD-10-CM | POA: Diagnosis not present

## 2020-04-10 DIAGNOSIS — Z79899 Other long term (current) drug therapy: Secondary | ICD-10-CM | POA: Insufficient documentation

## 2020-04-10 DIAGNOSIS — M533 Sacrococcygeal disorders, not elsewhere classified: Secondary | ICD-10-CM | POA: Diagnosis not present

## 2020-04-10 DIAGNOSIS — H905 Unspecified sensorineural hearing loss: Secondary | ICD-10-CM

## 2020-04-10 DIAGNOSIS — M5136 Other intervertebral disc degeneration, lumbar region: Secondary | ICD-10-CM | POA: Diagnosis not present

## 2020-04-10 DIAGNOSIS — H9193 Unspecified hearing loss, bilateral: Secondary | ICD-10-CM | POA: Insufficient documentation

## 2020-04-10 NOTE — ED Triage Notes (Signed)
Pt reports fullness to bilateral ears for a few months.  States she was seen at ENT on 2/16 and called back and asked the nurse and she was told that the Dr only said she had partial hearing loss.  Also reports headache/ "heaviness" to the back of her head for several months.  States she has seen numerous doctors for same.  No neuro deficits noted.

## 2020-04-10 NOTE — Progress Notes (Signed)
Office Visit Note  Patient: Tracey Rodriguez             Date of Birth: 1960-10-08           MRN: [id number]             PCP: Aura Dials, MD Referring: Aura Dials, MD Visit Date: 04/24/2020 Occupation: @GUAROCC @  Subjective:  Muscle and joint pain.   History of Present Illness: Tracey Rodriguez is a 60 y.o. female with a history of fibromyalgia, osteoarthritis and DDD. During her last visit she had significant SI joint pain which was keeping her up at night and interfering with her ability to walk. She received injections in her SI joints bilaterally which had limited relief for 3 weeks. She continues to have that pain today. She has intermittent nocturnal pain and difficulty walking. It is aggravated by standing or sitting or long periods of time. She continues to have back and neck pain from her DDD and bursitis. She has not been active lately due to being sick with COVID. Prior to [place] she was using a stationary bike at home. She has been stretching in the bed. She has an appointment today with the PT. She has been getting good results with PT. She has not had any joint swelling. She has decreased grip strength in her hands especially with opening jars. She has some stiffness in her hands with no pain. She also has had some issues with her balance lately. She occasionally takes Robaxin only after gardening or intense chores. She has had increased hair loss for the last 3-4 years. She has dry eyes. She denies new rash, or sores in her nose/mouth. She is a former smoker.   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   She has had pelvic floor dysfunction and would like to start PT.   She had a DEXA scan with her PCP and was told she has osteopenia. She is taking 1200 mg of calcium.   Activities of Daily Living:  Patient reports morning stiffness for 24 hours.   Patient Reports nocturnal pain.   Difficulty dressing/grooming: Reports Difficulty climbing stairs: Reports Difficulty getting out of chair: Reports Difficulty using hands for taps, buttons, cutlery, and/or writing: Reports  Review of Systems  Constitutional: Positive for fatigue.  HENT: Negative for mouth sores, mouth dryness and nose dryness.   Eyes: Positive for pain, itching, visual disturbance and dryness.  Respiratory: Positive for cough, shortness of breath and difficulty breathing. Negative for hemoptysis.   Cardiovascular: Negative for chest pain, palpitations and swelling in legs/feet.  Gastrointestinal: Negative for abdominal pain, blood in stool, constipation and diarrhea.  Endocrine: Positive for increased urination.  Genitourinary: Negative for painful urination.  Musculoskeletal: Positive for arthralgias, joint pain, joint swelling, muscle weakness and morning stiffness. Negative for myalgias, muscle tenderness and myalgias.  Skin: Positive for pallor. Negative for color change, rash and redness.  Allergic/Immunologic: Positive for susceptible to infections.  Neurological: Positive for dizziness, numbness and weakness. Negative for headaches and memory loss.  Hematological: Negative for swollen glands.  Psychiatric/Behavioral: Negative for confusion and sleep disturbance.    PMFS History:  Patient Active Problem List   Diagnosis Date Noted  . Chronic cough 04/19/2020  . NAFLD (nonalcoholic fatty liver disease) 09/29/2019  . Obesity (BMI 30.0-34.9) 09/29/2019  . Hyperlipidemia 09/09/2018  . Hx of adenomatous polyp of colon 02/04/2018  . Rectocele 01/27/2018  . Change in  bowel habits 01/05/2018  . Hemorrhoids 01/05/2018  . Hoarseness 07/30/2017  . Gastroesophageal reflux disease 07/30/2017  . History of breast cancer 09/11/2016  . Other fatigue 03/15/2016  . Primary insomnia 03/15/2016  . Primary osteoarthritis of both hands 03/15/2016  . DDD cervical spine 03/15/2016  . Osteoarthritis of lumbar  spine 03/15/2016  . History of bilateral carpal tunnel release 03/15/2016  . Unspecified hypothyroidism 03/30/2013  . Fibromyalgia 03/30/2013  . Breast cancer ([place]) 03/30/2013  . 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 Dyspnea 02/02/2013  . COPD (chronic obstructive pulmonary disease) ([place]) 02/02/2013  . Restrictive lung disease 02/02/2013    Past Medical History:  Diagnosis Date  . Arthritis    deg disc disease  - lower back, neck  . Cancer ([place])    left breast surgery-lumpectomy  . Disorder of vocal cord   . Factor V deficiency ([place])   . Fibromyalgia   . GERD (gastroesophageal reflux disease)   . Heart murmur   . Hx of adenomatous polyp of colon 02/04/2018  . Hyperlipidemia   . Hypothyroidism    goiter  . NAFLD (nonalcoholic fatty liver disease)   . Neuromuscular disorder ([place])    sciatic nerve  . PONV (postoperative nausea and vomiting)   . Rectocele 01/27/2018  . SVD (spontaneous vaginal delivery)    x 2    Family History  Problem Relation Age of Onset  . Heart disease Father 65  . Heart attack Father   . Clotting disorder Father   . Rheumatologic disease Father   . Crohn's disease Father   . Heart disease Maternal Grandfather   . Lung cancer Maternal Grandfather   . Breast cancer Mother 65  . Ovarian cancer Mother   . Liver cancer Maternal Grandmother   . Heart attack Brother   . Cancer Brother   . Lung cancer Brother   . Colon polyps Brother   . Colon polyps Brother   . Stroke Brother   . Breast cancer Maternal Aunt 60  . Kidney disease Paternal Uncle   . Irritable bowel syndrome Son   . Stroke Paternal Aunt   . Heart disease Paternal Aunt    Past Surgical History:  Procedure Laterality Date  . ABLATION    . APPENDECTOMY    . BREAST EXCISIONAL BIOPSY Left   . BREAST SURGERY     left- lumpectomy  . DILATATION & CURRETTAGE/HYSTEROSCOPY WITH RESECTOCOPE N/A 04/05/2012   Procedure: hysteroscopy with endocervical curretting;  Surgeon: Allyn Kenner,  DO;  Location: [facility];  Service: Gynecology;  Laterality: N/A;  . DILATION AND CURETTAGE OF UTERUS    . EYE SURGERY Bilateral 10/2017   cataract extraction   . LIVER BIOPSY  09/2018  . NOSE SURGERY     Social History   Social History Narrative   She is married, has children.  They are grown.  1 son and 1 daughter has at least 1 grandchild   Former smoker no significant alcohol no drug use   Immunization History  Administered Date(s) Administered  . Influenza Split 11/17/2012  . Influenza-Unspecified 11/24/2016  . PFIZER(Purple Top)SARS-COV-2 Vaccination 04/27/2019, 05/18/2019     Objective: Vital Signs: BP 126/80 (BP Location: Left Arm, Patient Position: Sitting, Cuff Size: Normal)   Pulse 81   Ht 5\' 6"  (1.676 m)   Wt 206 lb 6.4 oz (93.6 kg)   BMI 33.31 kg/m    Physical Exam   Musculoskeletal Exam: Good ROM of her c-spine. She had tendeness and tension  in her trapezius muscles bilaterally. Tenderness in her deltoid insertion bilaterally. Tenderness in her epicondyles bilaterally. She is in thoracic kyphosis. Good ROM of her shoulders, elbows, wrists, MCPs, PIPs and DIPs bilaterally with no signs of synovitis. Tenderness of her medial epicondyles laterally. Good ROM of her hips, knees, ankles and feet bilaterally with no signs of synovitis. No signs of effusion or warmth in her knees bilaterally. Tenderness over her trochanteric bursa and SI joints bilaterally. No MTP tenderness.   CDAI Exam: CDAI Score: - Patient Global: -; Provider Global: - Swollen: -; Tender: - Joint Exam 04/24/2020   No joint exam has been documented for this visit   There is currently no information documented on the homunculus. Go to the Rheumatology activity and complete the homunculus joint exam.  Investigation: No additional findings.  Imaging: DG Chest 2 View  Result Date: 04/17/2020 CLINICAL DATA:  Covid-19 viral infection. EXAM: CHEST - 2 VIEW COMPARISON:  07/06/2017 FINDINGS: The heart size  and mediastinal contours are within normal limits. Both lungs are clear. The visualized skeletal structures are unremarkable. IMPRESSION: No active cardiopulmonary disease. Electronically Signed   By: Marlaine Hind M.D.   On: 04/17/2020 09:43   CT HEAD WO CONTRAST  Result Date: 04/11/2020 CLINICAL DATA:  Dizziness and ear pain EXAM: CT HEAD WITHOUT CONTRAST TECHNIQUE: Contiguous axial images were obtained from the base of the skull through the vertex without intravenous contrast. COMPARISON:  None. FINDINGS: Brain: There is no mass, hemorrhage or extra-axial collection. The size and configuration of the ventricles and extra-axial CSF spaces are normal. The brain parenchyma is normal, without acute or chronic infarction. Vascular: No abnormal hyperdensity of the major intracranial arteries or dural venous sinuses. No intracranial atherosclerosis. Skull: The visualized skull base, calvarium and extracranial soft tissues are normal. Sinuses/Orbits: No fluid levels or advanced mucosal thickening of the visualized paranasal sinuses. No mastoid or middle ear effusion. The orbits are normal. IMPRESSION: Normal head CT. Electronically Signed   By: Ulyses Jarred M.D.   On: 04/11/2020 02:15    Recent Labs: Lab Results  Component Value Date   WBC 8.1 09/08/2018   HGB 13.6 09/08/2018   PLT 307 09/08/2018   NA 143 09/08/2018   K 4.5 09/08/2018   CL 102 09/08/2018   CO2 25 09/08/2018   GLUCOSE 105 (H) 09/08/2018   BUN 17 09/08/2018   CREATININE 0.89 09/08/2018   BILITOT 0.5 09/29/2017   ALKPHOS 72 03/27/2013   AST 23 09/29/2017   ALT 26 09/29/2017   PROT 7.1 09/29/2017   PROT 7.2 09/29/2017   ALBUMIN 4.4 03/27/2013   CALCIUM 9.4 09/08/2018   GFRAA 83 09/08/2018    Speciality Comments: No specialty comments available.  Procedures:  No procedures performed Allergies: Capsaicin, Etodolac, Bee venom, Crestor [rosuvastatin], Doxycycline, Pepto-bismol [bismuth], Pork-derived products, and Sulfonamide  derivatives   Assessment / Plan:     Visit Diagnoses: Fibromyalgia- The patient continues to have pain in several tender points. She has been undergoing PT which has been helping her. She will occasionally take Robaxin after completing intense chores. She stays active through her stationary bike and stretching daily. I discussed the importance of exercising, stretching and maintaining proper sleep hygiene. I recommended her to continue with PT. Side effects of Robaxin discussed.  Patient states that she takes Robaxin only occasionally.  Primary insomnia- contributes to continual symptoms of fibromyalgia. Proper sleep hygiene techniques were discussed with the patient.   Other fatigue- likely combination of fibromyalgia and primary insomnia.  Neck pain- due to DDD in her cervical spine. She has continuance of pain especially when moving.   Chronic SI joint pain - injected 10/25/2019 with minimal relief. She continues to have nocturnal pain and difficulty with prolong standing or sitting. I referred her to PT to work more specifically on her SI joints.   Primary osteoarthritis of both hands- Patient has noticed decreased grip strength especially with opening jars. Referral to PT to improve grip strength.   History of bilateral carpal tunnel release  DDD (degenerative disc disease), cervical- Continues to have neck pain.  C-spine exercises were emphasized.  DDD (degenerative disc disease), lumbar- Continues to have lower back pain.  Core strengthening exercises were emphasized.  Factor V deficiency ([place])  History of breast cancer  History of COPD  Osteoporosis screening - patient states that she had DEXA done by her PCP several years ago which showed osteopenia.  COVID-19 virus infection - 03/06/2020  Pelvic floor dysfunction-patient states that physical therapist felt she had pelvic floor dysfunction.  And recommended physical therapy.  A prescription will be sent.  Referred to PT.    Orders: Orders Placed This Encounter  Procedures  . Ambulatory referral to Physical Therapy   No orders of the defined types were placed in this encounter.    Follow-Up Instructions: Return in about 6 months (around 10/25/2020) for FMS, OA.   Bo Merino, MD  Note - This record has been created using Editor, commissioning.  Chart creation errors have been sought, but may not always  have been located. Such creation errors do not reflect on  the standard of medical care.

## 2020-04-11 ENCOUNTER — Emergency Department (HOSPITAL_COMMUNITY): Payer: BC Managed Care – PPO

## 2020-04-11 DIAGNOSIS — H9193 Unspecified hearing loss, bilateral: Secondary | ICD-10-CM | POA: Diagnosis not present

## 2020-04-11 DIAGNOSIS — R42 Dizziness and giddiness: Secondary | ICD-10-CM | POA: Diagnosis not present

## 2020-04-11 NOTE — ED Provider Notes (Signed)
[facility] EMERGENCY DEPARTMENT Provider Note   CSN: [id number] Arrival date & time: 04/10/20  1803     History Chief Complaint  Patient presents with  . Ear Fullness    Tracey Rodriguez is a 60 y.o. female.  Patient presents to the emergency department with concerns over hearing loss and fullness in her ears and head. Symptoms have been ongoing for 2 months. She was seen at ENT and had outpatient CT sinus that did not show any acute sinus problems. Patient has been on 3 different antibiotics and multiple courses of prednisone over the last 2 months for the symptoms and has not resolved. She reports that she has been feeling dizzy associated with the symptoms.        Past Medical History:  Diagnosis Date  . Arthritis    deg disc disease  - lower back, neck  . Cancer ([place])    left breast surgery-lumpectomy  . Disorder of vocal cord   . Factor V deficiency ([place])   . Fibromyalgia   . GERD (gastroesophageal reflux disease)   . Heart murmur   . Hx of adenomatous polyp of colon 02/04/2018  . Hyperlipidemia   . Hypothyroidism    goiter  . NAFLD (nonalcoholic fatty liver disease)   . Neuromuscular disorder ([place])    sciatic nerve  . PONV (postoperative nausea and vomiting)   . Rectocele 01/27/2018  . SVD (spontaneous vaginal delivery)    x 2    Patient Active Problem List   Diagnosis Date Noted  . NAFLD (nonalcoholic fatty liver disease) 09/29/2019  . Obesity (BMI 30.0-34.9) 09/29/2019  . Hyperlipidemia 09/09/2018  . Hx of adenomatous polyp of colon 02/04/2018  . Rectocele 01/27/2018  . Change in bowel habits 01/05/2018  . Hemorrhoids 01/05/2018  . Hoarseness 07/30/2017  . Gastroesophageal reflux disease 07/30/2017  . History of breast cancer 09/11/2016  . Other fatigue 03/15/2016  . Primary insomnia 03/15/2016  . Primary osteoarthritis of both hands 03/15/2016  . DDD cervical spine 03/15/2016  . 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  History of bilateral carpal tunnel release 03/15/2016  . Unspecified hypothyroidism 03/30/2013  . Fibromyalgia 03/30/2013  . Breast cancer ([place]) 03/30/2013  . 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 Dyspnea 02/02/2013  . COPD (chronic obstructive pulmonary disease) ([place]) 02/02/2013  . Restrictive lung disease 02/02/2013    Past Surgical History:  Procedure Laterality Date  . ABLATION    . APPENDECTOMY    . BREAST EXCISIONAL BIOPSY Left   . BREAST SURGERY     left- lumpectomy  . DILATATION & CURRETTAGE/HYSTEROSCOPY WITH RESECTOCOPE N/A 04/05/2012   Procedure: hysteroscopy with endocervical curretting;  Surgeon: Allyn Kenner, DO;  Location: [facility];  Service: Gynecology;  Laterality: N/A;  . DILATION AND CURETTAGE OF UTERUS    . EYE SURGERY Bilateral 10/2017   cataract extraction   . LIVER BIOPSY  09/2018  . NOSE SURGERY       OB History   No obstetric history on file.     Family History  Problem Relation Age of Onset  . Heart disease Father 84  . Heart attack Father   . Clotting disorder Father   . Rheumatologic disease Father   . Crohn's disease Father   . Heart disease Maternal Grandfather   . Lung cancer Maternal Grandfather   . Breast cancer Mother 2  . Ovarian cancer Mother   . Liver cancer Maternal Grandmother   . Heart attack Brother   .  Cancer Brother   . Lung cancer Brother   . Colon polyps Brother   . Colon polyps Brother   . Stroke Brother   . Breast cancer Maternal Aunt 60  . Kidney disease Paternal Uncle   . Irritable bowel syndrome Son   . Stroke Paternal Aunt   . Heart disease Paternal Aunt     Social History   Tobacco Use  . Smoking status: Former Smoker    Packs/day: 3.00    Years: 22.00    Pack years: 66.00    Types: Cigarettes    Quit date: 02/18/2000    Years since quitting: 20.1  . Smokeless tobacco: Never Used  Vaping Use  . Vaping Use: Never used  Substance Use Topics  . Alcohol use: Yes    Alcohol/week: 0.0 standard drinks     Comment: RARELY  . Drug use: Never    Home Medications Prior to Admission medications   Medication Sig Start Date End Date Taking? Authorizing Provider  albuterol (PROVENTIL HFA;VENTOLIN HFA) 108 (90 BASE) MCG/ACT inhaler Inhale 2 puffs into the lungs every 6 (six) hours as needed for wheezing or shortness of breath.    [provider]  atorvastatin (LIPITOR) 40 MG tablet Take 40 mg by mouth daily.    [provider]  b complex vitamins tablet Take by mouth. Use asdirected    [provider]  Biotin 5000 MCG CAPS Take 5,000 mcg by mouth daily.    [provider]  cetirizine (ZYRTEC) 10 MG tablet Take 10 mg by mouth as needed.     [provider]  diclofenac sodium (VOLTAREN) 1 % GEL Apply 1 application topically daily as needed (arthritis).     [provider]  Doxycycline Hyclate (DORYX) 200 MG TBEC Take 200 mg by mouth daily as needed (skin inflammation).    [provider]  doxylamine, Sleep, (UNISOM) 25 MG tablet Take 25 mg by mouth at bedtime as needed.    [provider]  EPINEPHrine (EPIPEN JR) 0.15 MG/0.3ML injection epinephrine (Jr) 0.15 mg/0.3 mL injection,auto-injector  INJECT AS DIRECTED    [provider]  fluorouracil (EFUDEX) 5 % cream Apply topically 2 (two) times daily. 10/19/19   [provider]  furosemide (LASIX) 20 MG tablet Take 1 tablet (20 mg total) by mouth daily as needed for fluid or edema. 09/08/18   Lyda Jester M, PA-C  hydrocortisone (ANUSOL-HC) 2.5 % rectal cream Place 1 application rectally 2 (two) times daily. Patient taking differently: Place 1 application rectally as needed.  01/05/18   Zehr, Laban Emperor, PA-C  levothyroxine (SYNTHROID, LEVOTHROID) 50 MCG tablet Take 50 mcg by mouth daily.    [provider]  MAGNESIUM PO Take by mouth.    [provider]  methocarbamol (ROBAXIN) 500 MG tablet TAKE 1 TABLET(500 MG) BY MOUTH TWICE DAILY AS NEEDED  FOR MUSCLE SPASMS 11/17/18   Bo Merino, MD  metoprolol succinate (TOPROL-XL) 50 MG 24 hr tablet Take 1 tablet (50 mg total) by mouth daily. Take with or immediately following a meal. Please make yearly appt with Dr. Irish Lack for August before anymore refills. 1st attempt 07/14/19   Jettie Booze, MD  Multiple Vitamins-Minerals (MULTIVITAMIN PO) Take by mouth daily.    [provider]  mupirocin ointment (BACTROBAN) 2 % Place 1 application into the nose daily as needed (blood blisters).    [provider]  NEXIUM 40 MG capsule TAKE ONE CAPSULE BY MOUTH TWICE DAILY BEFORE MEALS 11/14/19  Gatha Mayer, MD  niacinamide 500 MG tablet Take 500 mg by mouth 2 (two) times daily with a meal.    [provider]  OVER THE COUNTER MEDICATION Take 2 capsules by mouth daily. Med Name: STRESS B COMPLEX CAPSULE    [provider]  Phentermine-Topiramate (QSYMIA) 11.25-69 MG CP24 Take by mouth daily.    [provider]  Polyethyl Glycol-Propyl Glycol (SYSTANE OP) Apply to eye.    [provider]  scopolamine (TRANSDERM-SCOP, 1.5 MG,) 1 MG/3DAYS Transderm-Scop 1.5 mg transdermal patch (1 mg over 3 days)    [provider]  TURMERIC PO Take by mouth daily.    [provider]    Allergies    Capsaicin, Etodolac, Bee venom, Crestor [rosuvastatin], Doxycycline, Pepto-bismol [bismuth], Pork-derived products, and Sulfonamide derivatives  Review of Systems   Review of Systems  HENT: Positive for hearing loss.   Neurological: Positive for dizziness.  All other systems reviewed and are negative.   Physical Exam Updated Vital Signs BP 134/71   Pulse 71   Temp 98.3 F (36.8 C) (Oral)   Resp 16   SpO2 97%   Physical Exam Vitals and nursing note reviewed.  Constitutional:      General: She is not in acute distress.    Appearance: Normal appearance. She is well-developed and well-nourished.  HENT:     Head: Normocephalic and  atraumatic.     Right Ear: Hearing, tympanic membrane, ear canal and external ear normal.     Left Ear: Hearing, tympanic membrane, ear canal and external ear normal.     Nose: Nose normal.     Mouth/Throat:     Mouth: Oropharynx is clear and moist and mucous membranes are normal.  Eyes:     Extraocular Movements: EOM normal.     Conjunctiva/sclera: Conjunctivae normal.     Pupils: Pupils are equal, round, and reactive to light.  Cardiovascular:     Rate and Rhythm: Regular rhythm.     Heart sounds: S1 normal and S2 normal. No murmur heard. No friction rub. No gallop.   Pulmonary:     Effort: Pulmonary effort is normal. No respiratory distress.     Breath sounds: Normal breath sounds.  Chest:     Chest wall: No tenderness.  Abdominal:     General: Bowel sounds are normal.     Palpations: Abdomen is soft. There is no hepatosplenomegaly.     Tenderness: There is no abdominal tenderness. There is no guarding or rebound. Negative signs include Murphy's sign and McBurney's sign.     Hernia: No hernia is present.  Musculoskeletal:        General: Normal range of motion.     Cervical back: Normal range of motion and neck supple.  Skin:    General: Skin is warm, dry and intact.     Findings: No rash.     Nails: There is no cyanosis.  Neurological:     Mental Status: She is alert and oriented to person, place, and time.     GCS: GCS eye subscore is 4. GCS verbal subscore is 5. GCS motor subscore is 6.     Cranial Nerves: No cranial nerve deficit.     Sensory: No sensory deficit.     Coordination: Coordination normal.     Deep Tendon Reflexes: Strength normal.  Psychiatric:        Mood and Affect: Mood and affect normal.        Speech: Speech  normal.        Behavior: Behavior normal.        Thought Content: Thought content normal.     ED Results / Procedures / Treatments   Labs (all labs ordered are listed, but only abnormal results are displayed) Labs Reviewed - No data to  display  EKG None  Radiology CT HEAD WO CONTRAST  Result Date: 04/11/2020 CLINICAL DATA:  Dizziness and ear pain EXAM: CT HEAD WITHOUT CONTRAST TECHNIQUE: Contiguous axial images were obtained from the base of the skull through the vertex without intravenous contrast. COMPARISON:  None. FINDINGS: Brain: There is no mass, hemorrhage or extra-axial collection. The size and configuration of the ventricles and extra-axial CSF spaces are normal. The brain parenchyma is normal, without acute or chronic infarction. Vascular: No abnormal hyperdensity of the major intracranial arteries or dural venous sinuses. No intracranial atherosclerosis. Skull: The visualized skull base, calvarium and extracranial soft tissues are normal. Sinuses/Orbits: No fluid levels or advanced mucosal thickening of the visualized paranasal sinuses. No mastoid or middle ear effusion. The orbits are normal. IMPRESSION: Normal head CT. Electronically Signed   By: Ulyses Jarred M.D.   On: 04/11/2020 02:15    Procedures Procedures   Medications Ordered in ED Medications - No data to display  ED Course  I have reviewed the triage vital signs and the nursing notes.  Pertinent labs & imaging results that were available during my care of the patient were reviewed by me and considered in my medical decision making (see chart for details).    MDM Rules/Calculators/A&P                          Patient presents to the emergency department for evaluation of persistent fullness sensation in her ears, dizziness, fullness in the head. Patient has been seen multiple times in different clinics for this. She has already seen ENT and audiology, diagnosed with sensorineural hearing loss. Examination is unremarkable today. This includes tympanic membranes, canals as well as neurologic function. Patient previously had sinus CT that did not show any acute abnormality. Head CT today is unremarkable. Etiology of patient's persistent symptoms are  unclear. She has been on 3 different antibiotics and multiple courses of prednisone without improvement. I do not see any additional medications that will be helpful at this time, refer back to ENT.  Final Clinical Impression(s) / ED Diagnoses Final diagnoses:  None    Rx / DC Orders ED Discharge Orders    None       Isiaha [place], Gwenyth Allegra, MD 04/11/20 0225

## 2020-04-11 NOTE — Discharge Instructions (Signed)
Please follow-up with ENT for further evaluation of your hearing symptoms.

## 2020-04-13 DIAGNOSIS — M1990 Unspecified osteoarthritis, unspecified site: Secondary | ICD-10-CM | POA: Diagnosis not present

## 2020-04-13 DIAGNOSIS — D682 Hereditary deficiency of other clotting factors: Secondary | ICD-10-CM | POA: Diagnosis not present

## 2020-04-13 DIAGNOSIS — E669 Obesity, unspecified: Secondary | ICD-10-CM | POA: Diagnosis not present

## 2020-04-13 DIAGNOSIS — M797 Fibromyalgia: Secondary | ICD-10-CM | POA: Diagnosis not present

## 2020-04-13 DIAGNOSIS — K760 Fatty (change of) liver, not elsewhere classified: Secondary | ICD-10-CM | POA: Diagnosis not present

## 2020-04-13 DIAGNOSIS — J449 Chronic obstructive pulmonary disease, unspecified: Secondary | ICD-10-CM | POA: Diagnosis not present

## 2020-04-13 DIAGNOSIS — Z87891 Personal history of nicotine dependence: Secondary | ICD-10-CM | POA: Diagnosis not present

## 2020-04-13 DIAGNOSIS — I10 Essential (primary) hypertension: Secondary | ICD-10-CM | POA: Diagnosis not present

## 2020-04-13 DIAGNOSIS — E785 Hyperlipidemia, unspecified: Secondary | ICD-10-CM | POA: Diagnosis not present

## 2020-04-13 DIAGNOSIS — Z6833 Body mass index (BMI) 33.0-33.9, adult: Secondary | ICD-10-CM | POA: Diagnosis not present

## 2020-04-16 ENCOUNTER — Encounter: Payer: Self-pay | Admitting: Emergency Medicine

## 2020-04-16 ENCOUNTER — Other Ambulatory Visit: Payer: Self-pay

## 2020-04-16 ENCOUNTER — Ambulatory Visit: Payer: BC Managed Care – PPO | Admitting: Emergency Medicine

## 2020-04-16 ENCOUNTER — Ambulatory Visit (INDEPENDENT_AMBULATORY_CARE_PROVIDER_SITE_OTHER): Payer: BC Managed Care – PPO

## 2020-04-16 VITALS — BP 118/84 | HR 91 | Temp 98.5°F | Ht 66.0 in | Wt 203.6 lb

## 2020-04-16 DIAGNOSIS — J449 Chronic obstructive pulmonary disease, unspecified: Secondary | ICD-10-CM | POA: Diagnosis not present

## 2020-04-16 DIAGNOSIS — Z8616 Personal history of COVID-19: Secondary | ICD-10-CM

## 2020-04-16 DIAGNOSIS — U071 COVID-19: Secondary | ICD-10-CM | POA: Diagnosis not present

## 2020-04-16 DIAGNOSIS — R053 Chronic cough: Secondary | ICD-10-CM

## 2020-04-16 NOTE — Patient Instructions (Signed)
Chest x-ray today We will repeat your pulmonary function testing in next office visit Continue your Nexium 40 mg twice a day.  Take as directed by your gastroenterologist.  You need to avoid foods that will cause reflux.  Avoid eating after 8 PM.  Keep the head of your bed elevated when you sleep. Continue Zyrtec once every day. Continue fluticasone nasal spray, 2 sprays each nostril once daily. Start using nasal saline rinses (Nettie pot) once daily. Avoid throat clearing if at all possible. You may continue to use your guaifenesin/codeine cough syrup in the evening to suppress your cough. Try using Tessalon Perles 200 mg every 6 hours if needed to suppress cough. Follow with Dr. Lamonte Sakai next available with full pulmonary function testing on the same day.

## 2020-04-16 NOTE — Progress Notes (Signed)
Subjective:    Patient ID: Tracey Rodriguez, female    DOB: Sep 13, 1960, 60 y.o.   MRN: [id number]  HPI 60 year old former smoker with a history of hypertension, breast cancer status post, factor V Leyden, GERD, hypothyroidism, OA.  She underwent PFT 11/10/2012 that I reviewed.  This showed grossly normal airflows, normal volumes and normal diffusion capacity.  Possibly some mild obstruction  and restriction based on low FEV1 and FVC with a normal ratio.  Bronchodilators were not continued.  She returns today with symptoms consistent with upper airway irritation, severe hoarseness. A lot of throat clearing, globus sensation.  Has been see by her PCP and treated with abx and pred for ? Pharyngitis v bronchitis. She has had fatigue. Was treated with nasal steroid, zyrtec.   She was evaluated by ENT 07/27/2017 and laryngoscopy revealed a septal perforation, normal pharyngeal and glottic anatomy, incomplete closure of the vocal cords and a posterior glottic chink with phonation consistent with some muscle tension.  Her nexium has been increased to bid, not clearing her throat as much. She has seen GI, had esophageal dilation performed. She would be willing to see speech therapy.   ROV 04/16/20 --60 year old woman, former smoker with hypertension, history of breast cancer, factor V Leiden mutation, GERD, hypothyroidism.  Have seen her in the past for coughing with upper airway irritation and severe hoarseness, probably bothered by chronic rhinitis and GERD.  She has possible vocal cord dysfunction on her prior ENT evaluation 07/2017.  Pulmonary function testing 11/10/2012 are grossly normal.  Repeat pulmonary function testing 08/18/2017 reviewed by me, showed normal airflows, normal volumes, no change with bronchodilator, normal diffusion and a normal flow volume loop.  She has been treated with Zyrtec, uses as needed.  She is on Nexium twice daily She has had a few bouts of cough since I last saw her associated  with hoarseness and dyspnea, fatigue. On one occasion this happened when her HVAC system was changed.  She had COVID in in mid-December '21, mainly had body aches, no real congestion or cough. She has developed nasal congestion and drainage, cough beginning in January. Yellow/green mucous. She was given amoxicillin, completed 02/27/20. Then doxycycline late January. No real change in ear fullness, congestion, cough. Finally received levaquin in early February + pred because she still had sx. Treated again with pred course because cough persisted >> better but still happening at night and in the am. She is still has a lot of congestion, just started zyrtec, has been on flonase since February. Also using humidifier. Her GERD was worse on prednisone. She has remains on the Nexium.   Cardiac CT 09/17/2018 reviewed by me showed no evidence of parenchymal or airways abnormality, no mediastinal adenopathy.   Review of Systems  Constitutional: Positive for unexpected weight change. Negative for fever.  HENT: Negative for congestion, dental problem, ear pain, nosebleeds, postnasal drip, rhinorrhea, sinus pressure, sneezing, sore throat and trouble swallowing.   Eyes: Negative for redness and itching.  Respiratory: Positive for shortness of breath. Negative for cough, chest tightness and wheezing.   Cardiovascular: Negative for palpitations and leg swelling.  Gastrointestinal: Negative for nausea and vomiting.  Genitourinary: Negative for dysuria.  Musculoskeletal: Negative for joint swelling.  Skin: Negative for rash.  Neurological: Positive for headaches.  Hematological: Does not bruise/bleed easily.  Psychiatric/Behavioral: Negative for dysphoric mood. The patient is not nervous/anxious.     Past Medical History:  Diagnosis Date  . Arthritis    deg disc disease  -  lower back, neck  . Cancer (HCC)    left breast surgery-lumpectomy  . Disorder of vocal cord   . Factor V deficiency (HCC)   .  Fibromyalgia   . GERD (gastroesophageal reflux disease)   . Heart murmur   . Hx of adenomatous polyp of colon 02/04/2018  . Hyperlipidemia   . Hypothyroidism    goiter  . NAFLD (nonalcoholic fatty liver disease)   . Neuromuscular disorder (HCC)    sciatic nerve  . PONV (postoperative nausea and vomiting)   . Rectocele 01/27/2018  . SVD (spontaneous vaginal delivery)    x 2     Family History  Problem Relation Age of Onset  . Heart disease Father 57  . Heart attack Father   . Clotting disorder Father   . Rheumatologic disease Father   . Crohn's disease Father   . Heart disease Maternal Grandfather   . Lung cancer Maternal Grandfather   . Breast cancer Mother 8  . Ovarian cancer Mother   . Liver cancer Maternal Grandmother   . Heart attack Brother   . Cancer Brother   . Lung cancer Brother   . Colon polyps Brother   . Colon polyps Brother   . Stroke Brother   . Breast cancer Maternal Aunt 60  . Kidney disease Paternal Uncle   . Irritable bowel syndrome Son   . Stroke Paternal Aunt   . Heart disease Paternal Aunt      Social History   Socioeconomic History  . Marital status: Married    Spouse name: Not on file  . Number of children: 2  . Years of education: Not on file  . Highest education level: Not on file  Occupational History  . Not on file  Tobacco Use  . Smoking status: Former Smoker    Packs/day: 3.00    Years: 22.00    Pack years: 66.00    Types: Cigarettes    Quit date: 02/18/2000    Years since quitting: 20.1  . Smokeless tobacco: Never Used  Vaping Use  . Vaping Use: Never used  Substance and Sexual Activity  . Alcohol use: Yes    Alcohol/week: 0.0 standard drinks    Comment: RARELY  . Drug use: Never  . Sexual activity: Yes    Birth control/protection: Post-menopausal  Other Topics Concern  . Not on file  Social History Narrative   She is married, has children.  They are grown.  1 son and 1 daughter has at least 1 grandchild   Former  smoker no significant alcohol no drug use   Social Determinants of Corporate investment banker Strain: Not on file  Food Insecurity: Not on file  Transportation Needs: Not on file  Physical Activity: Not on file  Stress: Not on file  Social Connections: Not on file  Intimate Partner Violence: Not on file     Allergies  Allergen Reactions  . Capsaicin Anaphylaxis    Pt not sure of name   . Etodolac Shortness Of Breath    REACTION: dyspnea/tightness in chest  . Bee Venom   . Crestor [Rosuvastatin] Itching    All statins   . Doxycycline Nausea And Vomiting  . Pepto-Bismol [Bismuth] Nausea And Vomiting  . Pork-Derived Products Nausea And Vomiting  . Sulfonamide Derivatives Nausea And Vomiting    REACTION: vomiting     Outpatient Medications Prior to Visit  Medication Sig Dispense Refill  . albuterol (PROVENTIL HFA;VENTOLIN HFA) 108 (90 BASE) MCG/ACT inhaler  Inhale 2 puffs into the lungs every 6 (six) hours as needed for wheezing or shortness of breath.    . Biotin 5000 MCG CAPS Take 5,000 mcg by mouth daily.    . cetirizine (ZYRTEC) 10 MG tablet Take 10 mg by mouth as needed.     . Doxycycline Hyclate 200 MG TBEC Take 200 mg by mouth daily as needed (skin inflammation).    Marland Kitchen doxylamine, Sleep, (UNISOM) 25 MG tablet Take 25 mg by mouth at bedtime as needed.    Marland Kitchen EPINEPHrine (EPIPEN JR) 0.15 MG/0.3ML injection epinephrine (Jr) 0.15 mg/0.3 mL injection,auto-injector  INJECT AS DIRECTED    . furosemide (LASIX) 20 MG tablet Take 1 tablet (20 mg total) by mouth daily as needed for fluid or edema. 90 tablet 3  . guaiFENesin-codeine (ROBITUSSIN AC) 100-10 MG/5ML syrup 5-10 ml as needed    . levothyroxine (SYNTHROID, LEVOTHROID) 50 MCG tablet Take 50 mcg by mouth daily.    Marland Kitchen MAGNESIUM PO Take by mouth.    . methocarbamol (ROBAXIN) 500 MG tablet TAKE 1 TABLET(500 MG) BY MOUTH TWICE DAILY AS NEEDED FOR MUSCLE SPASMS 30 tablet 0  . metoprolol succinate (TOPROL-XL) 50 MG 24 hr tablet Take 1  tablet (50 mg total) by mouth daily. Take with or immediately following a meal. Please make yearly appt with Dr. Eldridge Dace for August before anymore refills. 1st attempt 90 tablet 0  . Multiple Vitamins-Minerals (MULTIVITAMIN PO) Take by mouth daily.    . mupirocin ointment (BACTROBAN) 2 % Place 1 application into the nose daily as needed (blood blisters).    Marland Kitchen NEXIUM 40 MG capsule TAKE ONE CAPSULE BY MOUTH TWICE DAILY BEFORE MEALS 180 capsule 3  . niacinamide 500 MG tablet Take 500 mg by mouth 2 (two) times daily with a meal.    . OVER THE COUNTER MEDICATION Take 2 capsules by mouth daily. Med Name: STRESS B COMPLEX CAPSULE    . Phentermine-Topiramate (QSYMIA) 11.25-69 MG CP24 Take by mouth daily.    Bertram Gala Glycol-Propyl Glycol (SYSTANE OP) Apply to eye.    Marland Kitchen scopolamine (TRANSDERM-SCOP) 1 MG/3DAYS Transderm-Scop 1.5 mg transdermal patch (1 mg over 3 days)    . atorvastatin (LIPITOR) 40 MG tablet Take 40 mg by mouth daily. (Patient not taking: Reported on 04/16/2020)    . b complex vitamins tablet Take by mouth. Use asdirected (Patient not taking: Reported on 04/16/2020)    . diclofenac sodium (VOLTAREN) 1 % GEL Apply 1 application topically daily as needed (arthritis).  (Patient not taking: Reported on 04/16/2020)    . fluorouracil (EFUDEX) 5 % cream Apply topically 2 (two) times daily. (Patient not taking: Reported on 04/16/2020)    . hydrocortisone (ANUSOL-HC) 2.5 % rectal cream Place 1 application rectally 2 (two) times daily. (Patient not taking: Reported on 04/16/2020) 30 g 1  . TURMERIC PO Take by mouth daily. (Patient not taking: Reported on 04/16/2020)     No facility-administered medications prior to visit.        Objective:   Physical Exam Vitals:   04/16/20 1339  BP: 118/84  Pulse: 91  Temp: 98.5 F (36.9 C)  TempSrc: Temporal  SpO2: 100%  Weight: 203 lb 9.6 oz (92.4 kg)  Height: 5\' 6"  (1.676 m)   Gen: Pleasant, overwt, in no distress,  normal affect  ENT: No lesions,   mouth clear,  oropharynx clear, no postnasal drip  Neck: No JVD, no stridor  Lungs: No use of accessory muscles, clear bilaterally  Cardiovascular: RRR,  heart sounds normal, no murmur or gallops, no peripheral edema  Musculoskeletal: No deformities, no cyanosis or clubbing  Neuro: alert, non focal  Skin: Warm, no lesions or rash     Assessment & Plan:  COPD (chronic obstructive pulmonary disease) Progressive dyspnea.  Difficult to determine whether this is principally due to her upper airway instability lately, superimposed restrictive disease, progression of her obstructive disease.  She needs full pulmonary function testing to troubleshoot.  We will also check a chest x-ray.  Adjust bronchodilators as indicated.  Consider walking oximetry going forward depending on improvement  Chronic cough With bronchitic symptoms, multiple antibiotics and prednisone since the end of the year when she had COVID-19.  Rhinitis and GERD are both contributors.   Continue your Nexium 40 mg twice a day.  Take as directed by your gastroenterologist.  You need to avoid foods that will cause reflux.  Avoid eating after 8 PM.  Keep the head of your bed elevated when you sleep. Continue Zyrtec once every day. Continue fluticasone nasal spray, 2 sprays each nostril once daily. Start using nasal saline rinses (Nettie pot) once daily. Avoid throat clearing if at all possible. You may continue to use your guaifenesin/codeine cough syrup in the evening to suppress your cough. Try using Tessalon Perles 200 mg every 6 hours if needed to suppress cough. Follow with Dr. Delton Coombes next available with full pulmonary function testing on the same day.  Levy Pupa, MD, PhD 04/19/2020, 3:01 PM [facility] [phone] or if no answer [phone]

## 2020-04-18 DIAGNOSIS — L570 Actinic keratosis: Secondary | ICD-10-CM | POA: Diagnosis not present

## 2020-04-19 ENCOUNTER — Encounter: Payer: Self-pay | Admitting: Emergency Medicine

## 2020-04-19 DIAGNOSIS — R053 Chronic cough: Secondary | ICD-10-CM | POA: Insufficient documentation

## 2020-04-19 NOTE — Assessment & Plan Note (Signed)
Progressive dyspnea.  Difficult to determine whether this is principally due to her upper airway instability lately, superimposed restrictive disease, progression of her obstructive disease.  She needs full pulmonary function testing to troubleshoot.  We will also check a chest x-ray.  Adjust bronchodilators as indicated.  Consider walking oximetry going forward depending on improvement

## 2020-04-19 NOTE — Assessment & Plan Note (Signed)
With bronchitic symptoms, multiple antibiotics and prednisone since the end of the year when she had COVID-19.  Rhinitis and GERD are both contributors.   Continue your Nexium 40 mg twice a day.  Take as directed by your gastroenterologist.  You need to avoid foods that will cause reflux.  Avoid eating after 8 PM.  Keep the head of your bed elevated when you sleep. Continue Zyrtec once every day. Continue fluticasone nasal spray, 2 sprays each nostril once daily. Start using nasal saline rinses (Nettie pot) once daily. Avoid throat clearing if at all possible. You may continue to use your guaifenesin/codeine cough syrup in the evening to suppress your cough. Try using Tessalon Perles 200 mg every 6 hours if needed to suppress cough. Follow with Dr. Lamonte Sakai next available with full pulmonary function testing on the same day.

## 2020-04-24 ENCOUNTER — Ambulatory Visit: Payer: BC Managed Care – PPO | Admitting: Rheumatology

## 2020-04-24 ENCOUNTER — Encounter: Payer: Self-pay | Admitting: Rheumatology

## 2020-04-24 ENCOUNTER — Other Ambulatory Visit: Payer: Self-pay

## 2020-04-24 VITALS — BP 126/80 | HR 81 | Ht 66.0 in | Wt 206.4 lb

## 2020-04-24 DIAGNOSIS — M5136 Other intervertebral disc degeneration, lumbar region: Secondary | ICD-10-CM

## 2020-04-24 DIAGNOSIS — M6289 Other specified disorders of muscle: Secondary | ICD-10-CM

## 2020-04-24 DIAGNOSIS — M51369 Other intervertebral disc degeneration, lumbar region without mention of lumbar back pain or lower extremity pain: Secondary | ICD-10-CM

## 2020-04-24 DIAGNOSIS — M533 Sacrococcygeal disorders, not elsewhere classified: Secondary | ICD-10-CM | POA: Diagnosis not present

## 2020-04-24 DIAGNOSIS — Z853 Personal history of malignant neoplasm of breast: Secondary | ICD-10-CM

## 2020-04-24 DIAGNOSIS — Z9889 Other specified postprocedural states: Secondary | ICD-10-CM

## 2020-04-24 DIAGNOSIS — R5383 Other fatigue: Secondary | ICD-10-CM

## 2020-04-24 DIAGNOSIS — U071 COVID-19: Secondary | ICD-10-CM

## 2020-04-24 DIAGNOSIS — D682 Hereditary deficiency of other clotting factors: Secondary | ICD-10-CM

## 2020-04-24 DIAGNOSIS — Z8709 Personal history of other diseases of the respiratory system: Secondary | ICD-10-CM

## 2020-04-24 DIAGNOSIS — G8929 Other chronic pain: Secondary | ICD-10-CM

## 2020-04-24 DIAGNOSIS — M797 Fibromyalgia: Secondary | ICD-10-CM | POA: Diagnosis not present

## 2020-04-24 DIAGNOSIS — M542 Cervicalgia: Secondary | ICD-10-CM | POA: Diagnosis not present

## 2020-04-24 DIAGNOSIS — M5030 Other cervical disc degeneration, unspecified cervical region: Secondary | ICD-10-CM | POA: Diagnosis not present

## 2020-04-24 DIAGNOSIS — F5101 Primary insomnia: Secondary | ICD-10-CM | POA: Diagnosis not present

## 2020-04-24 DIAGNOSIS — M19041 Primary osteoarthritis, right hand: Secondary | ICD-10-CM

## 2020-04-24 DIAGNOSIS — Z13820 Encounter for screening for osteoporosis: Secondary | ICD-10-CM

## 2020-04-24 DIAGNOSIS — M19042 Primary osteoarthritis, left hand: Secondary | ICD-10-CM

## 2020-04-26 DIAGNOSIS — M5136 Other intervertebral disc degeneration, lumbar region: Secondary | ICD-10-CM | POA: Diagnosis not present

## 2020-04-26 DIAGNOSIS — M797 Fibromyalgia: Secondary | ICD-10-CM | POA: Diagnosis not present

## 2020-04-26 DIAGNOSIS — M5030 Other cervical disc degeneration, unspecified cervical region: Secondary | ICD-10-CM | POA: Diagnosis not present

## 2020-04-26 DIAGNOSIS — M533 Sacrococcygeal disorders, not elsewhere classified: Secondary | ICD-10-CM | POA: Diagnosis not present

## 2020-05-02 ENCOUNTER — Other Ambulatory Visit: Payer: Self-pay | Admitting: Physician Assistant

## 2020-05-02 DIAGNOSIS — E042 Nontoxic multinodular goiter: Secondary | ICD-10-CM

## 2020-05-03 ENCOUNTER — Telehealth: Payer: Self-pay | Admitting: Interventional Cardiology

## 2020-05-03 DIAGNOSIS — G479 Sleep disorder, unspecified: Secondary | ICD-10-CM | POA: Diagnosis not present

## 2020-05-03 DIAGNOSIS — J029 Acute pharyngitis, unspecified: Secondary | ICD-10-CM | POA: Diagnosis not present

## 2020-05-03 DIAGNOSIS — E042 Nontoxic multinodular goiter: Secondary | ICD-10-CM | POA: Diagnosis not present

## 2020-05-03 DIAGNOSIS — K219 Gastro-esophageal reflux disease without esophagitis: Secondary | ICD-10-CM | POA: Diagnosis not present

## 2020-05-03 NOTE — Telephone Encounter (Signed)
Pt c/o medication issue:  1. Name of Medication: metoprolol succinate (TOPROL-XL) 50 MG 24 hr tablet  2. How are you currently taking this medication (dosage and times per day)? As directed  3. Are you having a reaction (difficulty breathing--STAT)? yes  4. What is your medication issue? Patient states she has started having hair loss and believes it may be associated with this. She states that this has been going on for a while. She wants to know if Dr. Irish Lack thinks this medication could be causing this.

## 2020-05-04 NOTE — Telephone Encounter (Signed)
Hair loss coming from metoprolol or other meds?

## 2020-05-04 NOTE — Telephone Encounter (Signed)
I agree with Megan's recs.  If she cannot stop the Qsymia, then she can decrease metoprolol as Jinny Blossom suggests below.   JV

## 2020-05-04 NOTE — Telephone Encounter (Signed)
Beta blockers can contribute to hair loss, reported incidence is low ~1% . Her Qsymia would be more likely to contribute to hair loss (reported incidence is 2-4% secondary to topiramate). Stopping either med would be the best way to rule this out. Would recommend stopping Qsymia first to see if symptoms improve. If not, I don't see a compelling indication to continue on beta blocker therapy and she could cut her Toprol in half to take 25mg  daily for 2 weeks, then stop therapy. She'd need to monitor BP readings and could consider addition of ACEi/ARB if alternative blood pressure therapy is needed (prior swelling on amlodipine).

## 2020-05-07 DIAGNOSIS — R490 Dysphonia: Secondary | ICD-10-CM | POA: Diagnosis not present

## 2020-05-07 DIAGNOSIS — H903 Sensorineural hearing loss, bilateral: Secondary | ICD-10-CM | POA: Diagnosis not present

## 2020-05-07 NOTE — Telephone Encounter (Signed)
Patient notified. If she stops Toprol she will monitor BP and heart rate and let us know if they become elevated.

## 2020-05-14 DIAGNOSIS — G4710 Hypersomnia, unspecified: Secondary | ICD-10-CM | POA: Diagnosis not present

## 2020-05-14 DIAGNOSIS — G4719 Other hypersomnia: Secondary | ICD-10-CM | POA: Diagnosis not present

## 2020-05-15 DIAGNOSIS — R0902 Hypoxemia: Secondary | ICD-10-CM | POA: Diagnosis not present

## 2020-05-16 ENCOUNTER — Ambulatory Visit
Admission: RE | Admit: 2020-05-16 | Discharge: 2020-05-16 | Disposition: A | Discharge status: Home / Self Care | Payer: BC Managed Care – PPO | Source: Ambulatory Visit | Attending: Physician Assistant | Admitting: Physician Assistant

## 2020-05-16 DIAGNOSIS — E041 Nontoxic single thyroid nodule: Secondary | ICD-10-CM | POA: Diagnosis not present

## 2020-05-16 DIAGNOSIS — E042 Nontoxic multinodular goiter: Secondary | ICD-10-CM

## 2020-05-18 ENCOUNTER — Other Ambulatory Visit (HOSPITAL_COMMUNITY)
Admission: RE | Admit: 2020-05-18 | Discharge: 2020-05-18 | Disposition: A | Discharge status: Home / Self Care | Payer: BC Managed Care – PPO | Source: Ambulatory Visit | Attending: Emergency Medicine | Admitting: Emergency Medicine

## 2020-05-18 DIAGNOSIS — Z20822 Contact with and (suspected) exposure to covid-19: Secondary | ICD-10-CM | POA: Diagnosis not present

## 2020-05-18 DIAGNOSIS — Z01812 Encounter for preprocedural laboratory examination: Secondary | ICD-10-CM | POA: Insufficient documentation

## 2020-05-18 LAB — SARS CORONAVIRUS 2 (TAT 6-24 HRS): SARS Coronavirus 2: NEGATIVE

## 2020-05-21 ENCOUNTER — Other Ambulatory Visit: Payer: Self-pay

## 2020-05-21 ENCOUNTER — Ambulatory Visit (INDEPENDENT_AMBULATORY_CARE_PROVIDER_SITE_OTHER): Payer: BC Managed Care – PPO | Admitting: Emergency Medicine

## 2020-05-21 ENCOUNTER — Ambulatory Visit: Payer: BC Managed Care – PPO | Admitting: Emergency Medicine

## 2020-05-21 ENCOUNTER — Encounter: Payer: Self-pay | Admitting: Emergency Medicine

## 2020-05-21 DIAGNOSIS — Z8616 Personal history of COVID-19: Secondary | ICD-10-CM

## 2020-05-21 DIAGNOSIS — J383 Other diseases of vocal cords: Secondary | ICD-10-CM

## 2020-05-21 LAB — PULMONARY FUNCTION TEST
DL/VA % pred: 107 %
DL/VA: 4.47 ml/min/mmHg/L
DLCO cor % pred: 101 %
DLCO cor: 21.96 ml/min/mmHg
DLCO unc % pred: 99 %
DLCO unc: 21.68 ml/min/mmHg
FEF 25-75 Post: 3.02 L/sec
FEF 25-75 Pre: 3.32 L/sec
FEF2575-%Change-Post: -8 %
FEF2575-%Pred-Post: 120 %
FEF2575-%Pred-Pre: 132 %
FEV1-%Change-Post: -1 %
FEV1-%Pred-Post: 102 %
FEV1-%Pred-Pre: 103 %
FEV1-Post: 2.83 L
FEV1-Pre: 2.88 L
FEV1FVC-%Change-Post: 4 %
FEV1FVC-%Pred-Pre: 105 %
FEV6-%Change-Post: -5 %
FEV6-%Pred-Post: 94 %
FEV6-%Pred-Pre: 100 %
FEV6-Post: 3.28 L
FEV6-Pre: 3.49 L
FEV6FVC-%Change-Post: 0 %
FEV6FVC-%Pred-Post: 103 %
FEV6FVC-%Pred-Pre: 103 %
FVC-%Change-Post: -5 %
FVC-%Pred-Post: 91 %
FVC-%Pred-Pre: 97 %
FVC-Post: 3.29 L
FVC-Pre: 3.49 L
Post FEV1/FVC ratio: 86 %
Post FEV6/FVC ratio: 100 %
Pre FEV1/FVC ratio: 83 %
Pre FEV6/FVC Ratio: 100 %
RV % pred: 84 %
RV: 1.74 L
TLC % pred: 98 %
TLC: 5.26 L

## 2020-05-21 NOTE — Assessment & Plan Note (Signed)
Main contribution is likely GERD but she probably does also have some chronic rhinitis.  She might benefit from restarting therapy as her symptoms did increase when she stopped it.  I do not think there is a role for albuterol here.  She does need to see speech pathology, did not want to go to [facility].  I question whether she might have a Zenker's diverticulum based on her description.  She would probably benefit from going back to see Dr. Carlean Purl with gastroenterology.  She is following with ENT.  Your chest x-ray performed last month is normal. Your pulmonary function testing does not show any evidence for asthma or COPD.  This is good news. You do not need to continue to use albuterol I think that your shortness of breath, chronic cough do relate to irritation of your upper airway Would continue your Nexium as you have been taking it There may be some benefit to restarting your Zyrtec and your fluticasone nasal spray since you have noticed increased drainage off of these medications. You may benefit from a repeat endoscopy to rule out a swallowing abnormality or retained food when swallowing We will try to refer you to a local speech pathologist.  If we are unable to find an adequate referral then you may need to reconsider going back to [facility] Follow with ENT as planned Follow with Dr. Lamonte Sakai in 12 months or sooner if you have any problems.

## 2020-05-21 NOTE — Progress Notes (Signed)
PFT done today. 

## 2020-05-21 NOTE — Progress Notes (Signed)
Subjective:    Patient ID: Tracey Rodriguez, female    DOB: 03/28/1960, 60 y.o.   MRN: [id number]  HPI 60 year old former smoker with a history of hypertension, breast cancer status post, factor V Leyden, GERD, hypothyroidism, OA.  She underwent PFT 11/10/2012 that I reviewed.  This showed grossly normal airflows, normal volumes and normal diffusion capacity.  Possibly some mild obstruction  and restriction based on low FEV1 and FVC with a normal ratio.  Bronchodilators were not continued.  She returns today with symptoms consistent with upper airway irritation, severe hoarseness. A lot of throat clearing, globus sensation.  Has been see by her PCP and treated with abx and pred for ? Pharyngitis v bronchitis. She has had fatigue. Was treated with nasal steroid, zyrtec.   She was evaluated by ENT 07/27/2017 and laryngoscopy revealed a septal perforation, normal pharyngeal and glottic anatomy, incomplete closure of the vocal cords and a posterior glottic chink with phonation consistent with some muscle tension.  Her nexium has been increased to bid, not clearing her throat as much. She has seen GI, had esophageal dilation performed. She would be willing to see speech therapy.   ROV 04/16/20 --60 year old woman, former smoker with hypertension, history of breast cancer, factor V Leiden mutation, GERD, hypothyroidism.  Have seen her in the past for coughing with upper airway irritation and severe hoarseness, probably bothered by chronic rhinitis and GERD.  She has possible vocal cord dysfunction on her prior ENT evaluation 07/2017.  Pulmonary function testing 11/10/2012 are grossly normal.  Repeat pulmonary function testing 08/18/2017 reviewed by me, showed normal airflows, normal volumes, no change with bronchodilator, normal diffusion and a normal flow volume loop.  She has been treated with Zyrtec, uses as needed.  She is on Nexium twice daily She has had a few bouts of cough since I last saw her associated  with hoarseness and dyspnea, fatigue. On one occasion this happened when her HVAC system was changed.  She had COVID in in mid-December '21, mainly had body aches, no real congestion or cough. She has developed nasal congestion and drainage, cough beginning in January. Yellow/green mucous. She was given amoxicillin, completed 02/27/20. Then doxycycline late January. No real change in ear fullness, congestion, cough. Finally received levaquin in early February + pred because she still had sx. Treated again with pred course because cough persisted >> better but still happening at night and in the am. She is still has a lot of congestion, just started zyrtec, has been on flonase since February. Also using humidifier. Her GERD was worse on prednisone. She has remains on the Nexium.   Cardiac CT 09/17/2018 reviewed by me showed no evidence of parenchymal or airways abnormality, no mediastinal adenopathy.  ROV 05/21/20 --this is a follow-up visit for dyspnea and cough.  60 year old former smoker with breast cancer, hypertension, factor V Leiden mutation, GERD, hypothyroidism, suspected VCD on prior ENT evaluation.  Her dyspnea has been progressive and we repeated her pulmonary function testing today as below.  She is GERD and chronic rhinitis that are contributors.  Last time we continued Nexium twice daily, continue Zyrtec and fluticasone nasal spray and added nasal saline rinses.  Also discussed cough avoidance and cough suppression with Tessalon, codeine cough syrup.  She has albuterol, does sometimes use it but little effect. She feels UA irritation, also a sensation of retained throat contents, ? A zenker's diverticulum. She does still have some cough at night, but it is better during the day.  It was recommended that she go to speech pathology at [facility], but she wants to change this to [place].   Pulmonary function testing performed today and reviewed by me, show normal airflows without a bronchodilator  response, normal lung volumes, normal diffusion capacity.  Her flow volume loop is normal.  Chest x-ray from 04/17/2020 reviewed by me, normal, no infiltrates.  MDM: Reviewed ENT note from 05/07/2020   Review of Systems  Constitutional: Positive for unexpected weight change. Negative for fever.  HENT: Negative for congestion, dental problem, ear pain, nosebleeds, postnasal drip, rhinorrhea, sinus pressure, sneezing, sore throat and trouble swallowing.   Eyes: Negative for redness and itching.  Respiratory: Positive for shortness of breath. Negative for cough, chest tightness and wheezing.   Cardiovascular: Negative for palpitations and leg swelling.  Gastrointestinal: Negative for nausea and vomiting.  Genitourinary: Negative for dysuria.  Musculoskeletal: Negative for joint swelling.  Skin: Negative for rash.  Neurological: Positive for headaches.  Hematological: Does not bruise/bleed easily.  Psychiatric/Behavioral: Negative for dysphoric mood. The patient is not nervous/anxious.         Objective:   Physical Exam Vitals:   05/21/20 1604  BP: 118/72  Pulse: 85  Temp: (!) 97.2 F (36.2 C)  TempSrc: Temporal  SpO2: 99%  Weight: 203 lb 12.8 oz (92.4 kg)  Height: 5\' 6"  (1.676 m)   Gen: Pleasant, overwt, in no distress,  normal affect  ENT: No lesions,  mouth clear,  oropharynx clear, no postnasal drip  Neck: No JVD, hoarse voice, no overt stridor  Lungs: No use of accessory muscles, clear bilaterally  Cardiovascular: RRR, heart sounds normal, no murmur or gallops, no peripheral edema  Musculoskeletal: No deformities, no cyanosis or clubbing  Neuro: alert, non focal  Skin: Warm, no lesions or rash     Assessment & Plan:  Vocal cord dysfunction Main contribution is likely GERD but she probably does also have some chronic rhinitis.  She might benefit from restarting therapy as her symptoms did increase when she stopped it.  I do not think there is a role for albuterol  here.  She does need to see speech pathology, did not want to go to [facility].  I question whether she might have a Zenker's diverticulum based on her description.  She would probably benefit from going back to see Dr. Carlean Purl with gastroenterology.  She is following with ENT.  Your chest x-ray performed last month is normal. Your pulmonary function testing does not show any evidence for asthma or COPD.  This is good news. You do not need to continue to use albuterol I think that your shortness of breath, chronic cough do relate to irritation of your upper airway Would continue your Nexium as you have been taking it There may be some benefit to restarting your Zyrtec and your fluticasone nasal spray since you have noticed increased drainage off of these medications. You may benefit from a repeat endoscopy to rule out a swallowing abnormality or retained food when swallowing We will try to refer you to a local speech pathologist.  If we are unable to find an adequate referral then you may need to reconsider going back to [facility] Follow with ENT as planned Follow with Dr. Lamonte Sakai in 12 months or sooner if you have any problems.  Baltazar Apo, MD, PhD 05/21/2020, 4:46 PM [facility] [phone] or if no answer [phone]

## 2020-05-21 NOTE — Patient Instructions (Signed)
Your chest x-ray performed last month is normal. Your pulmonary function testing does not show any evidence for asthma or COPD.  This is good news. You do not need to continue to use albuterol I think that your shortness of breath, chronic cough do relate to irritation of your upper airway Would continue your Nexium as you have been taking it There may be some benefit to restarting your Zyrtec and your fluticasone nasal spray since you have noticed increased drainage off of these medications. You may benefit from a repeat endoscopy to rule out a swallowing abnormality or retained food when swallowing We will try to refer you to a local speech pathologist.  If we are unable to find an adequate referral then you may need to reconsider going back to [facility] Follow with ENT as planned Follow with Dr. Lamonte Sakai in 12 months or sooner if you have any problems.

## 2020-05-23 DIAGNOSIS — D351 Benign neoplasm of parathyroid gland: Secondary | ICD-10-CM | POA: Diagnosis not present

## 2020-06-07 DIAGNOSIS — J0100 Acute maxillary sinusitis, unspecified: Secondary | ICD-10-CM | POA: Diagnosis not present

## 2020-06-11 DIAGNOSIS — N3281 Overactive bladder: Secondary | ICD-10-CM | POA: Diagnosis not present

## 2020-06-11 DIAGNOSIS — N3941 Urge incontinence: Secondary | ICD-10-CM | POA: Diagnosis not present

## 2020-06-14 ENCOUNTER — Other Ambulatory Visit (HOSPITAL_BASED_OUTPATIENT_CLINIC_OR_DEPARTMENT_OTHER): Payer: Self-pay

## 2020-06-14 DIAGNOSIS — G4719 Other hypersomnia: Secondary | ICD-10-CM

## 2020-06-14 DIAGNOSIS — G478 Other sleep disorders: Secondary | ICD-10-CM

## 2020-06-14 DIAGNOSIS — R0902 Hypoxemia: Secondary | ICD-10-CM

## 2020-06-20 DIAGNOSIS — M5459 Other low back pain: Secondary | ICD-10-CM | POA: Diagnosis not present

## 2020-06-20 DIAGNOSIS — M542 Cervicalgia: Secondary | ICD-10-CM | POA: Diagnosis not present

## 2020-06-21 DIAGNOSIS — N3941 Urge incontinence: Secondary | ICD-10-CM | POA: Diagnosis not present

## 2020-06-21 DIAGNOSIS — N3281 Overactive bladder: Secondary | ICD-10-CM | POA: Diagnosis not present

## 2020-06-22 DIAGNOSIS — M545 Low back pain, unspecified: Secondary | ICD-10-CM | POA: Diagnosis not present

## 2020-06-22 DIAGNOSIS — M542 Cervicalgia: Secondary | ICD-10-CM | POA: Diagnosis not present

## 2020-06-26 DIAGNOSIS — H04123 Dry eye syndrome of bilateral lacrimal glands: Secondary | ICD-10-CM | POA: Diagnosis not present

## 2020-06-26 DIAGNOSIS — H0288B Meibomian gland dysfunction left eye, upper and lower eyelids: Secondary | ICD-10-CM | POA: Diagnosis not present

## 2020-06-26 DIAGNOSIS — H18613 Keratoconus, stable, bilateral: Secondary | ICD-10-CM | POA: Diagnosis not present

## 2020-06-26 DIAGNOSIS — Z961 Presence of intraocular lens: Secondary | ICD-10-CM | POA: Diagnosis not present

## 2020-06-26 DIAGNOSIS — N3941 Urge incontinence: Secondary | ICD-10-CM | POA: Diagnosis not present

## 2020-06-26 DIAGNOSIS — H0288A Meibomian gland dysfunction right eye, upper and lower eyelids: Secondary | ICD-10-CM | POA: Diagnosis not present

## 2020-06-27 DIAGNOSIS — M4317 Spondylolisthesis, lumbosacral region: Secondary | ICD-10-CM | POA: Diagnosis not present

## 2020-06-27 DIAGNOSIS — M542 Cervicalgia: Secondary | ICD-10-CM | POA: Diagnosis not present

## 2020-06-27 DIAGNOSIS — M545 Low back pain, unspecified: Secondary | ICD-10-CM | POA: Diagnosis not present

## 2020-07-02 DIAGNOSIS — E7800 Pure hypercholesterolemia, unspecified: Secondary | ICD-10-CM | POA: Diagnosis not present

## 2020-07-02 DIAGNOSIS — E669 Obesity, unspecified: Secondary | ICD-10-CM | POA: Diagnosis not present

## 2020-07-02 DIAGNOSIS — R7303 Prediabetes: Secondary | ICD-10-CM | POA: Diagnosis not present

## 2020-07-02 DIAGNOSIS — Z79899 Other long term (current) drug therapy: Secondary | ICD-10-CM | POA: Diagnosis not present

## 2020-07-03 DIAGNOSIS — N3281 Overactive bladder: Secondary | ICD-10-CM | POA: Diagnosis not present

## 2020-07-03 DIAGNOSIS — N3941 Urge incontinence: Secondary | ICD-10-CM | POA: Diagnosis not present

## 2020-07-03 DIAGNOSIS — D229 Melanocytic nevi, unspecified: Secondary | ICD-10-CM | POA: Diagnosis not present

## 2020-07-03 DIAGNOSIS — L650 Telogen effluvium: Secondary | ICD-10-CM | POA: Diagnosis not present

## 2020-07-03 DIAGNOSIS — D692 Other nonthrombocytopenic purpura: Secondary | ICD-10-CM | POA: Diagnosis not present

## 2020-07-03 DIAGNOSIS — L578 Other skin changes due to chronic exposure to nonionizing radiation: Secondary | ICD-10-CM | POA: Diagnosis not present

## 2020-07-10 DIAGNOSIS — N3941 Urge incontinence: Secondary | ICD-10-CM | POA: Diagnosis not present

## 2020-07-11 DIAGNOSIS — F413 Other mixed anxiety disorders: Secondary | ICD-10-CM | POA: Diagnosis not present

## 2020-07-17 DIAGNOSIS — N3941 Urge incontinence: Secondary | ICD-10-CM | POA: Diagnosis not present

## 2020-07-18 DIAGNOSIS — F413 Other mixed anxiety disorders: Secondary | ICD-10-CM | POA: Diagnosis not present

## 2020-07-19 DIAGNOSIS — M533 Sacrococcygeal disorders, not elsewhere classified: Secondary | ICD-10-CM | POA: Diagnosis not present

## 2020-07-19 DIAGNOSIS — M5030 Other cervical disc degeneration, unspecified cervical region: Secondary | ICD-10-CM | POA: Diagnosis not present

## 2020-07-19 DIAGNOSIS — M5136 Other intervertebral disc degeneration, lumbar region: Secondary | ICD-10-CM | POA: Diagnosis not present

## 2020-07-19 DIAGNOSIS — M797 Fibromyalgia: Secondary | ICD-10-CM | POA: Diagnosis not present

## 2020-07-20 DIAGNOSIS — R059 Cough, unspecified: Secondary | ICD-10-CM | POA: Diagnosis not present

## 2020-07-20 DIAGNOSIS — J029 Acute pharyngitis, unspecified: Secondary | ICD-10-CM | POA: Diagnosis not present

## 2020-07-20 DIAGNOSIS — J329 Chronic sinusitis, unspecified: Secondary | ICD-10-CM | POA: Diagnosis not present

## 2020-07-24 DIAGNOSIS — N3281 Overactive bladder: Secondary | ICD-10-CM | POA: Diagnosis not present

## 2020-07-24 DIAGNOSIS — N3941 Urge incontinence: Secondary | ICD-10-CM | POA: Diagnosis not present

## 2020-07-26 DIAGNOSIS — M5136 Other intervertebral disc degeneration, lumbar region: Secondary | ICD-10-CM | POA: Diagnosis not present

## 2020-07-26 DIAGNOSIS — M5030 Other cervical disc degeneration, unspecified cervical region: Secondary | ICD-10-CM | POA: Diagnosis not present

## 2020-07-26 DIAGNOSIS — F413 Other mixed anxiety disorders: Secondary | ICD-10-CM | POA: Diagnosis not present

## 2020-07-26 DIAGNOSIS — M533 Sacrococcygeal disorders, not elsewhere classified: Secondary | ICD-10-CM | POA: Diagnosis not present

## 2020-07-26 DIAGNOSIS — M797 Fibromyalgia: Secondary | ICD-10-CM | POA: Diagnosis not present

## 2020-07-31 DIAGNOSIS — E041 Nontoxic single thyroid nodule: Secondary | ICD-10-CM | POA: Insufficient documentation

## 2020-07-31 DIAGNOSIS — M5030 Other cervical disc degeneration, unspecified cervical region: Secondary | ICD-10-CM | POA: Diagnosis not present

## 2020-07-31 DIAGNOSIS — M797 Fibromyalgia: Secondary | ICD-10-CM | POA: Diagnosis not present

## 2020-07-31 DIAGNOSIS — Q782 Osteopetrosis: Secondary | ICD-10-CM | POA: Insufficient documentation

## 2020-07-31 DIAGNOSIS — M5136 Other intervertebral disc degeneration, lumbar region: Secondary | ICD-10-CM | POA: Diagnosis not present

## 2020-07-31 DIAGNOSIS — M533 Sacrococcygeal disorders, not elsewhere classified: Secondary | ICD-10-CM | POA: Diagnosis not present

## 2020-07-31 DIAGNOSIS — N3941 Urge incontinence: Secondary | ICD-10-CM | POA: Diagnosis not present

## 2020-08-07 DIAGNOSIS — Z0000 Encounter for general adult medical examination without abnormal findings: Secondary | ICD-10-CM | POA: Diagnosis not present

## 2020-08-07 DIAGNOSIS — Z13220 Encounter for screening for lipoid disorders: Secondary | ICD-10-CM | POA: Diagnosis not present

## 2020-08-07 DIAGNOSIS — R739 Hyperglycemia, unspecified: Secondary | ICD-10-CM | POA: Diagnosis not present

## 2020-08-07 DIAGNOSIS — E039 Hypothyroidism, unspecified: Secondary | ICD-10-CM | POA: Diagnosis not present

## 2020-08-08 DIAGNOSIS — F413 Other mixed anxiety disorders: Secondary | ICD-10-CM | POA: Diagnosis not present

## 2020-08-09 DIAGNOSIS — N3941 Urge incontinence: Secondary | ICD-10-CM | POA: Diagnosis not present

## 2020-08-10 ENCOUNTER — Other Ambulatory Visit: Payer: Self-pay

## 2020-08-10 ENCOUNTER — Ambulatory Visit (HOSPITAL_BASED_OUTPATIENT_CLINIC_OR_DEPARTMENT_OTHER): Payer: BC Managed Care – PPO | Attending: Internal Medicine | Admitting: Internal Medicine

## 2020-08-10 VITALS — Ht 67.0 in | Wt 216.0 lb

## 2020-08-10 DIAGNOSIS — R400 Somnolence: Secondary | ICD-10-CM | POA: Diagnosis not present

## 2020-08-10 DIAGNOSIS — G4719 Other hypersomnia: Secondary | ICD-10-CM

## 2020-08-10 DIAGNOSIS — R0902 Hypoxemia: Secondary | ICD-10-CM

## 2020-08-10 DIAGNOSIS — G4733 Obstructive sleep apnea (adult) (pediatric): Secondary | ICD-10-CM

## 2020-08-10 DIAGNOSIS — G478 Other sleep disorders: Secondary | ICD-10-CM

## 2020-08-12 NOTE — Progress Notes (Signed)
Cardiology Office Note   Date:  08/13/2020   ID:  Tracey Rodriguez, DOB 12-06-1960, MRN [id number]  PCP:  Aura Dials, MD    No chief complaint on file.  hyperlipidemia  Wt Readings from Last 3 Encounters:  08/13/20 218 lb 12.8 oz (99.2 kg)  08/10/20 216 lb (98 kg)  05/21/20 203 lb 12.8 oz (92.4 kg)       History of Present Illness: Tracey Rodriguez is a 60 y.o. female  Who has hyperlipidemia, and family history of premature coronary artery disease.  She had carpal tunnel surgery in 10/16.  After the surgery, she had a squeezing feeling in both arms.  She had several episodes of this in December.  It happened again early 2017.     In Feb 2017, she had chest pain with arm pain with presyncope and dyspnea.  She did not fully pass out.  She was at [employer] at the time.     In 2017, she had a cardiac eval with Dr. Einar Gip.  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  She had a nuclear stress test that was negative for ischemia.     When wearing the monitor, she had some fatigue and this correlated to allow BP.   I saw her in 2017.  At that time, she had intermittent dizziness for which we encouraged her to stay well-hydrated.  She was drinking a lot of coffee at that time.  She was not sleeping well.   We also discussed her hyperlipidemia.  She had reported difficulty affording red yeast rice.  She had itching with Crestor.  She had memory issues with simvastatin.  We discussed trying atorvastatin but she preferred trying red yeast rice.   She saw Tracey Rodriguez in August 2020.  She was referred to lipid clinic due to statin intolerance.  Metoprolol was added for blood pressure.  2D echo was done showing normal LVEF with mild diastolic dysfunction.  Coronary CTA showed no CAD and a calcium score of 0.   In the lipid clinic, the patient was offered PCSK9 versus Zetia versus bempedoic acid but she declined.  She ultimately ended up trying  medium dose Lipitor and tolerated this for some time.   She had been started been on amlodipine for HTN and developed some swelling in her legs.  It was stopped.  Atorvastatin was stopped as she attributed knee pain to this medicine.    She still has some DOE with walking.    She has had some sharp pain, worse with a deep breath.   LDL 197 in 07/2020.  Started ezetemibe.    Past Medical History:  Diagnosis Date   Arthritis    deg disc disease  - lower back, neck   Cancer (HCC)    left breast surgery-lumpectomy   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 (HCC)    Fibromyalgia    GERD (gastroesophageal reflux disease)    Heart murmur    Hx of adenomatous polyp of colon 02/04/2018   Hyperlipidemia    Hypothyroidism    goiter   NAFLD (nonalcoholic fatty liver disease)    Neuromuscular disorder (HCC)    sciatic nerve   PONV (postoperative nausea and vomiting)    Rectocele 01/27/2018   SVD (spontaneous vaginal delivery)    x 2    Past Surgical History:  Procedure Laterality Date   ABLATION  APPENDECTOMY     BREAST EXCISIONAL BIOPSY Left    BREAST SURGERY     left- lumpectomy   DILATATION & CURRETTAGE/HYSTEROSCOPY WITH RESECTOCOPE N/A 04/05/2012   Procedure: hysteroscopy with endocervical curretting;  Surgeon: Allyn Kenner, DO;  Location: [facility];  Service: Gynecology;  Laterality: N/A;   DILATION AND CURETTAGE OF UTERUS     EYE SURGERY Bilateral 10/2017   cataract extraction    LIVER BIOPSY  09/2018   NOSE SURGERY       Current Outpatient Medications  Medication Sig Dispense Refill   albuterol (PROVENTIL HFA;VENTOLIN HFA) 108 (90 BASE) MCG/ACT inhaler Inhale 2 puffs into the lungs every 6 (six) hours as needed for wheezing or shortness of breath.     b complex vitamins tablet Take by mouth. Use asdirected     Biotin 5000 MCG CAPS Take 5,000 mcg by mouth daily.     cetirizine (ZYRTEC) 10 MG tablet Take 10 mg by mouth as needed.      doxycycline  (VIBRA-TABS) 100 MG tablet Take 100 mg by mouth 2 (two) times daily.     Doxycycline Hyclate 200 MG TBEC Take 200 mg by mouth daily as needed (skin inflammation).     EPINEPHrine (EPIPEN JR) 0.15 MG/0.3ML injection epinephrine (Jr) 0.15 mg/0.3 mL injection,auto-injector  INJECT AS DIRECTED     ezetimibe (ZETIA) 10 MG tablet Take 10 mg by mouth daily.     fluorouracil (EFUDEX) 5 % cream fluorouracil 5 % topical cream     fluticasone (FLONASE) 50 MCG/ACT nasal spray 1 spray in each nostril     furosemide (LASIX) 20 MG tablet Take 1 tablet (20 mg total) by mouth daily as needed for fluid or edema. 90 tablet 3   hydrocortisone 2.5 % ointment hydrocortisone 2.5 % topical ointment     hydroquinone 4 % cream at bedtime.     levothyroxine (SYNTHROID, LEVOTHROID) 50 MCG tablet Take 50 mcg by mouth daily.     MAGNESIUM PO Take by mouth.     metFORMIN (GLUCOPHAGE-XR) 500 MG 24 hr tablet Take 500 mg by mouth daily.     metFORMIN (GLUCOPHAGE-XR) 500 MG 24 hr tablet 1 tablet with evening meal     methocarbamol (ROBAXIN) 500 MG tablet TAKE 1 TABLET(500 MG) BY MOUTH TWICE DAILY AS NEEDED FOR MUSCLE SPASMS 30 tablet 0   metoprolol succinate (TOPROL-XL) 50 MG 24 hr tablet Take 1 tablet (50 mg total) by mouth daily. Take with or immediately following a meal. Please make yearly appt with Dr. Irish Lack for August before anymore refills. 1st attempt 90 tablet 0   Multiple Vitamins-Minerals (MULTIVITAMIN PO) Take by mouth daily.     mupirocin ointment (BACTROBAN) 2 % Place 1 application into the nose daily as needed (blood blisters).     NEXIUM 40 MG capsule TAKE ONE CAPSULE BY MOUTH TWICE DAILY BEFORE MEALS 180 capsule 3   niacinamide 500 MG tablet Take 500 mg by mouth 2 (two) times daily with a meal.     Polyethyl Glycol-Propyl Glycol (SYSTANE OP) Apply to eye.     scopolamine (TRANSDERM-SCOP) 1 MG/3DAYS Transderm-Scop 1.5 mg transdermal patch (1 mg over 3 days)     triamcinolone cream (KENALOG) 0.1 % triamcinolone  acetonide 0.1 % topical cream     No current facility-administered medications for this visit.    Allergies:   Capsaicin, Etodolac, Amlodipine, Bee venom, Crestor [rosuvastatin], Doxycycline, Pepto-bismol [bismuth], Pork-derived products, and Sulfonamide derivatives    Social History:  The patient  reports  that she quit smoking about 20 years ago. Her smoking use included cigarettes. She has a 66.00 pack-year smoking history. She has never used smokeless tobacco. She reports current alcohol use. She reports that she does not use drugs.   Family History:  The patient's family history includes Breast cancer (age of onset: 72) in her maternal aunt; Breast cancer (age of onset: 20) in her mother; Cancer in her brother; Clotting disorder in her father; Colon polyps in her brother and brother; Crohn's disease in her father; Heart attack in her brother and father; Heart disease in her maternal grandfather and paternal aunt; Heart disease (age of onset: 43) in her father; Irritable bowel syndrome in her son; Kidney disease in her paternal uncle; Liver cancer in her maternal grandmother; Lung cancer in her brother and maternal grandfather; Ovarian cancer in her mother; Rheumatologic disease in her father; Stroke in her brother and paternal aunt.    ROS:  Please see the history of present illness.   Otherwise, review of systems are positive for leg edema, take Lasix 2x/week; bladder issues.   All other systems are reviewed and negative.    PHYSICAL EXAM: VS:  BP 124/72   Pulse 82   Ht 5\' 7"  (1.702 m)   Wt 218 lb 12.8 oz (99.2 kg)   SpO2 94%   BMI 34.27 kg/m  , BMI Body mass index is 34.27 kg/m. GEN: Well nourished, well developed, in no acute distress HEENT: normal Neck: no JVD, carotid bruits, or masses Cardiac: RRR; no murmurs, rubs, or gallops,no edema  Respiratory:  clear to auscultation bilaterally, normal work of breathing GI: soft, nontender, nondistended, + BS, obese MS: no deformity or  atrophy Skin: warm and dry, no rash Neuro:  Strength and sensation are intact Psych: euthymic mood, full affect   EKG:   The ekg ordered today demonstrates NSR, no ST changes   Recent Labs: No results found for requested labs within last 8760 hours.   Lipid Panel No results found for: CHOL, TRIG, HDL, CHOLHDL, VLDL, LDLCALC, LDLDIRECT   Other studies Reviewed: Additional studies/ records that were reviewed today with results demonstrating: labs reviewed   ASSESSMENT AND PLAN:  Hyperlipidemia: Held off on statin due to low calcium score.  Zetia was started.  I think we should try to get her LDL < 160 at least.   HTN: Low salt diet. Avoid processed foods.  Family h/o premature CAD: Calcium score of 0 in the past.  This gives her a very low risk for ACS in the following 5 years.  She has some atypical chest pain but no changes on her ECG. Morbid obesity: High fiber, whole food, plant based diet.  Chronic pain: trying to deal with this without narcotics.  Okay to take meloxicam on occasion.  I did counsel her to avoid meloxicam on an empty stomach. Palpitations: negative monitor in 2017.    Current medicines are reviewed at length with the patient today.  The patient concerns regarding her medicines were addressed.  The following changes have been made:  No change  Labs/ tests ordered today include:  No orders of the defined types were placed in this encounter.   Recommend 150 minutes/week of aerobic exercise Low fat, low carb, high fiber diet recommended  Disposition:   FU in 1 year   Signed, Larae Grooms, MD  08/13/2020 10:02 AM    [facility] [address], [place], [place]  [address] Phone: [phone]; Fax: [phone]    

## 2020-08-13 ENCOUNTER — Encounter: Payer: Self-pay | Admitting: Interventional Cardiology

## 2020-08-13 ENCOUNTER — Other Ambulatory Visit: Payer: Self-pay

## 2020-08-13 ENCOUNTER — Ambulatory Visit: Payer: BC Managed Care – PPO | Admitting: Interventional Cardiology

## 2020-08-13 VITALS — BP 124/72 | HR 82 | Ht 67.0 in | Wt 218.8 lb

## 2020-08-13 DIAGNOSIS — Z8249 Family history of ischemic heart disease and other diseases of the circulatory system: Secondary | ICD-10-CM

## 2020-08-13 DIAGNOSIS — I10 Essential (primary) hypertension: Secondary | ICD-10-CM

## 2020-08-13 DIAGNOSIS — E782 Mixed hyperlipidemia: Secondary | ICD-10-CM | POA: Diagnosis not present

## 2020-08-13 NOTE — Patient Instructions (Signed)
Medication Instructions:  Your physician recommends that you continue on your current medications as directed. Please refer to the Current Medication list given to you today.  *If you need a refill on your cardiac medications before your next appointment, please call your pharmacy*   Lab Work: none If you have labs (blood work) drawn today and your tests are completely normal, you will receive your results only by: [place] (if you have MyChart) OR A paper copy in the mail If you have any lab test that is abnormal or we need to change your treatment, we will call you to review the results.   Testing/Procedures: none   Follow-Up: At [facility], you and your health needs are our priority.  As part of our continuing mission to provide you with exceptional heart care, we have created designated Provider Care Teams.  These Care Teams include your primary Cardiologist (physician) and Advanced Practice Providers (APPs -  Physician Assistants and Nurse Practitioners) who all work together to provide you with the care you need, when you need it.  We recommend signing up for the patient portal called "MyChart".  Sign up information is provided on this After Visit Summary.  MyChart is used to connect with patients for Virtual Visits (Telemedicine).  Patients are able to view lab/test results, encounter notes, upcoming appointments, etc.  Non-urgent messages can be sent to your provider as well.   To learn more about what you can do with MyChart, go to [email or web address].    Your next appointment:   12 month(s)  The format for your next appointment:   In Person  Provider:   You may see Larae Grooms, MD or one of the following Advanced Practice Providers on your designated Care Team:   Melina Copa, PA-C Ermalinda Barrios, PA-C   Other Instructions High-Fiber Eating Plan Fiber, also called dietary fiber, is a type of carbohydrate. It is found foods such as fruits, vegetables,  whole grains, and beans. A high-fiber diet can have many health benefits. Your health care provider may recommend a high-fiber diet to help: Prevent constipation. Fiber can make your bowel movements more regular. Lower your cholesterol. Relieve the following conditions: Inflammation of veins in the anus (hemorrhoids). Inflammation of specific areas of the digestive tract (uncomplicated diverticulosis). A problem of the large intestine, also called the colon, that sometimes causes pain and diarrhea (irritable bowel syndrome, or IBS). Prevent overeating as part of a weight-loss plan. Prevent heart disease, type 2 diabetes, and certain cancers. What are tips for following this plan? Reading food labels  Check the nutrition facts label on food products for the amount of dietary fiber. Choose foods that have 5 grams of fiber or more per serving. The goals for recommended daily fiber intake include: Men (age 49 or younger): 34-38 g. Men (over age 62): 28-34 g. Women (age 45 or younger): 25-28 g. Women (over age 54): 22-25 g. Your daily fiber goal is _____________ g. Shopping Choose whole fruits and vegetables instead of processed forms, such as apple juice or applesauce. Choose a wide variety of high-fiber foods such as avocados, lentils, oats, and kidney beans. Read the nutrition facts label of the foods you choose. Be aware of foods with added fiber. These foods often have high sugar and sodium amounts per serving. Cooking Use whole-grain flour for baking and cooking. Cook with brown rice instead of white rice. Meal planning Start the day with a breakfast that is high in fiber, such as a cereal that  contains 5 g of fiber or more per serving. Eat breads and cereals that are made with whole-grain flour instead of refined flour or white flour. Eat brown rice, bulgur wheat, or millet instead of white rice. Use beans in place of meat in soups, salads, and pasta dishes. Be sure that half of the  grains you eat each day are whole grains. General information You can get the recommended daily intake of dietary fiber by: Eating a variety of fruits, vegetables, grains, nuts, and beans. Taking a fiber supplement if you are not able to take in enough fiber in your diet. It is better to get fiber through food than from a supplement. Gradually increase how much fiber you consume. If you increase your intake of dietary fiber too quickly, you may have bloating, cramping, or gas. Drink plenty of water to help you digest fiber. Choose high-fiber snacks, such as berries, raw vegetables, nuts, and popcorn. What foods should I eat? Fruits Berries. Pears. Apples. Oranges. Avocado. Prunes and raisins. Dried figs. Vegetables Sweet potatoes. Spinach. Kale. Artichokes. Cabbage. Broccoli. Cauliflower.Green peas. Carrots. Squash. Grains Whole-grain breads. Multigrain cereal. Oats and oatmeal. Brown rice. Barley.Bulgur wheat. [place]. Quinoa. Bran muffins. Popcorn. Rye wafer crackers. Meats and other proteins Navy beans, kidney beans, and pinto beans. Soybeans. Split peas. Lentils. Nutsand seeds. Dairy Fiber-fortified yogurt. Beverages Fiber-fortified soy milk. Fiber-fortified orange juice. Other foods Fiber bars. The items listed above may not be a complete list of recommended foods and beverages. Contact a dietitian for more information. What foods should I avoid? Fruits Fruit juice. Cooked, strained fruit. Vegetables Fried potatoes. Canned vegetables. Well-cooked vegetables. Grains White bread. Pasta made with refined flour. White rice. Meats and other proteins Fatty cuts of meat. Fried chicken or fried fish. Dairy Milk. Yogurt. Cream cheese. Sour cream. Fats and oils Butters. Beverages Soft drinks. Other foods Cakes and pastries. The items listed above may not be a complete list of foods and beverages to avoid. Talk with your dietitian about what choices are best for you. Summary Fiber  is a type of carbohydrate. It is found in foods such as fruits, vegetables, whole grains, and beans. A high-fiber diet has many benefits. It can help to prevent constipation, lower blood cholesterol, aid weight loss, and reduce your risk of heart disease, diabetes, and certain cancers. Increase your intake of fiber gradually. Increasing fiber too quickly may cause cramping, bloating, and gas. Drink plenty of water while you increase the amount of fiber you consume. The best sources of fiber include whole fruits and vegetables, whole grains, nuts, seeds, and beans. This information is not intended to replace advice given to you by your health care provider. Make sure you discuss any questions you have with your healthcare provider. Document Revised: 06/09/2019 Document Reviewed: 06/09/2019 Elsevier Patient Education  2022 [employer].

## 2020-08-14 ENCOUNTER — Other Ambulatory Visit (INDEPENDENT_AMBULATORY_CARE_PROVIDER_SITE_OTHER): Payer: BC Managed Care – PPO

## 2020-08-14 ENCOUNTER — Ambulatory Visit: Payer: BC Managed Care – PPO | Admitting: Nurse Practitioner

## 2020-08-14 ENCOUNTER — Encounter: Payer: Self-pay | Admitting: Nurse Practitioner

## 2020-08-14 VITALS — BP 136/74 | HR 94 | Ht 69.0 in | Wt 219.0 lb

## 2020-08-14 DIAGNOSIS — K921 Melena: Secondary | ICD-10-CM

## 2020-08-14 DIAGNOSIS — R1310 Dysphagia, unspecified: Secondary | ICD-10-CM

## 2020-08-14 DIAGNOSIS — G4710 Hypersomnia, unspecified: Secondary | ICD-10-CM | POA: Diagnosis not present

## 2020-08-14 DIAGNOSIS — N3941 Urge incontinence: Secondary | ICD-10-CM | POA: Diagnosis not present

## 2020-08-14 LAB — COMPREHENSIVE METABOLIC PANEL
ALT: 30 U/L (ref 0–35)
AST: 21 U/L (ref 0–37)
Albumin: 4.1 g/dL (ref 3.5–5.2)
Alkaline Phosphatase: 67 U/L (ref 39–117)
BUN: 22 mg/dL (ref 6–23)
CO2: 27 mEq/L (ref 19–32)
Calcium: 9.3 mg/dL (ref 8.4–10.5)
Chloride: 104 mEq/L (ref 96–112)
Creatinine, Ser: 1.42 mg/dL — ABNORMAL HIGH (ref 0.40–1.20)
GFR: 40.34 mL/min — ABNORMAL LOW (ref >60.00)
Glucose, Bld: 96 mg/dL (ref 70–99)
Potassium: 4.1 mEq/L (ref 3.5–5.1)
Sodium: 139 mEq/L (ref 135–145)
Total Bilirubin: 0.3 mg/dL (ref 0.2–1.2)
Total Protein: 7.2 g/dL (ref 6.0–8.3)

## 2020-08-14 LAB — CBC WITH DIFFERENTIAL/PLATELET
Basophils Absolute: 0.1 10*3/uL (ref 0.0–0.1)
Basophils Relative: 0.9 % (ref 0.0–3.0)
Eosinophils Absolute: 0.2 10*3/uL (ref 0.0–0.7)
Eosinophils Relative: 2.1 % (ref 0.0–5.0)
HCT: 38.1 % (ref 36.0–46.0)
Hemoglobin: 13 g/dL (ref 12.0–15.0)
Lymphocytes Relative: 25.6 % (ref 12.0–46.0)
Lymphs Abs: 2.8 10*3/uL (ref 0.7–4.0)
MCHC: 34 g/dL (ref 30.0–36.0)
MCV: 86.3 fl (ref 78.0–100.0)
Monocytes Absolute: 0.9 10*3/uL (ref 0.1–1.0)
Monocytes Relative: 8.7 % (ref 3.0–12.0)
Neutro Abs: 6.9 10*3/uL (ref 1.4–7.7)
Neutrophils Relative %: 62.7 % (ref 43.0–77.0)
Platelets: 388 10*3/uL (ref 150.0–400.0)
RBC: 4.42 Mil/uL (ref 3.87–5.11)
RDW: 13.2 % (ref 11.5–15.5)
WBC: 10.9 10*3/uL — ABNORMAL HIGH (ref 4.0–10.5)

## 2020-08-14 NOTE — Progress Notes (Signed)
08/14/2020 Tracey Rodriguez [id number] [birth year]   Chief Complaint: difficulty swallowing   History of Present Illness: Tracey Rodriguez is a 60 year old female with a past medical history of arthritis, fibromyalgia, morbid obesity, factor V deficiency, breast cancer s/p left breast lumpectomy, hyperlipidemia, fatty liver disease and colon polyps.  Covid 19 infection 01/2020. She presents to our office today for further evaluation regarding dysphagia and vomiting.  She has a history of dysphagia for which she underwent an EGD by Dr. Carlean Purl 08/06/2017 which showed a normal esophagus which was empirically dilated.  The stomach and duodenum were normal as well.  She stated her swallowing symptoms somewhat improved after her esophagus was dilated.  She underwent a swallow study by speech pathology which was normal.  She reports having persistent dysphagia which occurs if she eats big pieces of food or drinks a big gulp of water.  Sometimes food such as cornbread will also trigger her dysphagia.  She describes having food or liquid that gets hung up in the throat area which occurs once every week or 2 with associated choking sensation.  She feels her throat gets tight during these episodes and finds it difficult to breathe at times.  She also describes gagging then vomits out clear to yellow malodorous mucus described as yellow cottage cheese appearing every 2 weeks for the past few months.  She is taking Nexium 40mg  po bid which controls her heartburn.  No upper abdominal pain.  She saw ENT Dr. Redmond Baseman a few months ago and she reported laryngoscopy exam was unrevealing, no changes in her vocal chords (history of incomplete closure of the vocal cords). She saw her pulmonologist in April and pulmonary function testing done at that time showed normal air flows, no evidence of COPD.  She reported taking 3-4 courses of antibiotics from January to April 2022 for sinus infection +/- bronchitis involvement. She  also was on Prednisone on and off during this time as well.  No lower abdominal pain.  She reported passing a black-colored soft stool once or twice daily for the past 2 to 3 days.  She denies taking Pepto-Bismol or oral iron.  No rectal bleeding.  No NSAIDs.  Her most recent colonoscopy was 01/27/2018 and one 3mm tubular adenomatous polyp was removed at the appendiceal orifice.  Her brother was recently diagnosed with throat cancer, he used snuff/chewed tobacco.  History of fatty liver disease. Liver elastography 02/2018 showed a fibrosis score F3 - F4, at high risk for fibrosis. She is followed by bariatric specialist at [facility], on Phentermine for weight loss.  She previously declined bariatric surgery.  EGD 08/06/2017:  - Normal esophagus. - Normal stomach. - Normal examined duodenum. - Dilation performed in the entire esophagus. - No specimens collected.  Colonoscopy 01/27/2018: Rectocele found on digital rectal exam. - One 3 mm tubular adenomatous polyp at the appendiceal orifice, removed with a cold snare. Resected and retrieved. -5-year colonoscopy recall - Diverticulosis in the sigmoid colon. - The examination was otherwise normal on direct and retroflexion views.-  Colonoscopy 03/09/2008:Normal colonoscopy  Current Outpatient Medications on File Prior to Visit  Medication Sig Dispense Refill   albuterol (PROVENTIL HFA;VENTOLIN HFA) 108 (90 BASE) MCG/ACT inhaler Inhale 2 puffs into the lungs every 6 (six) hours as needed for wheezing or shortness of breath.     b complex vitamins tablet Take by mouth. Use asdirected     Biotin 5000 MCG CAPS Take 5,000 mcg by mouth daily.  cetirizine (ZYRTEC) 10 MG tablet Take 10 mg by mouth as needed.      doxycycline (VIBRA-TABS) 100 MG tablet Take 100 mg by mouth 2 (two) times daily.     Doxycycline Hyclate 200 MG TBEC Take 200 mg by mouth daily as needed (skin inflammation).     EPINEPHrine (EPIPEN JR) 0.15 MG/0.3ML injection epinephrine  (Jr) 0.15 mg/0.3 mL injection,auto-injector  INJECT AS DIRECTED     ezetimibe (ZETIA) 10 MG tablet Take 10 mg by mouth daily.     fluorouracil (EFUDEX) 5 % cream fluorouracil 5 % topical cream     fluticasone (FLONASE) 50 MCG/ACT nasal spray 1 spray in each nostril     furosemide (LASIX) 20 MG tablet Take 1 tablet (20 mg total) by mouth daily as needed for fluid or edema. 90 tablet 3   hydrocortisone 2.5 % ointment hydrocortisone 2.5 % topical ointment     hydroquinone 4 % cream at bedtime.     levothyroxine (SYNTHROID, LEVOTHROID) 50 MCG tablet Take 50 mcg by mouth daily.     MAGNESIUM PO Take by mouth.     metFORMIN (GLUCOPHAGE-XR) 500 MG 24 hr tablet Take 500 mg by mouth daily.     methocarbamol (ROBAXIN) 500 MG tablet TAKE 1 TABLET(500 MG) BY MOUTH TWICE DAILY AS NEEDED FOR MUSCLE SPASMS 30 tablet 0   metoprolol succinate (TOPROL-XL) 50 MG 24 hr tablet Take 1 tablet (50 mg total) by mouth daily. Take with or immediately following a meal. Please make yearly appt with Dr. Irish Lack for August before anymore refills. 1st attempt 90 tablet 0   Multiple Vitamins-Minerals (MULTIVITAMIN PO) Take by mouth daily.     mupirocin ointment (BACTROBAN) 2 % Place 1 application into the nose daily as needed (blood blisters).     NEXIUM 40 MG capsule TAKE ONE CAPSULE BY MOUTH TWICE DAILY BEFORE MEALS 180 capsule 3   niacinamide 500 MG tablet Take 500 mg by mouth 2 (two) times daily with a meal.     Polyethyl Glycol-Propyl Glycol (SYSTANE OP) Apply to eye.     scopolamine (TRANSDERM-SCOP) 1 MG/3DAYS Transderm-Scop 1.5 mg transdermal patch (1 mg over 3 days)     triamcinolone cream (KENALOG) 0.1 % triamcinolone acetonide 0.1 % topical cream     No current facility-administered medications on file prior to visit.   Allergies  Allergen Reactions   Capsaicin Anaphylaxis    Pt not sure of name    Etodolac Shortness Of Breath    REACTION: dyspnea/tightness in chest   Amlodipine     LEE   Bee Venom     Crestor [Rosuvastatin] Itching    All statins    Doxycycline Nausea And Vomiting   Pepto-Bismol [Bismuth] Nausea And Vomiting   Pork-Derived Products Nausea And Vomiting   Sulfonamide Derivatives Nausea And Vomiting    REACTION: vomiting    Current Medications, Allergies, Past Medical History, Past Surgical History, Family History and Social History were reviewed in [employer] record.  Review of Systems:   Constitutional: Negative for fever, sweats, chills or weight loss.  Respiratory: Negative for shortness of breath.   Cardiovascular: Negative for chest pain, palpitations and leg swelling.  Gastrointestinal: See HPI.  Musculoskeletal: + Back pain. Neurological: Negative for dizziness, headaches or paresthesias.   Physical Exam: BP 136/74   Pulse 94   Ht 5\' 9"  (1.753 m)   Wt 219 lb (99.3 kg)   BMI 32.34 kg/m  General: 60 year old female in no acute distress. Head:  Normocephalic and atraumatic. Eyes: No scleral icterus. Conjunctiva pink . Ears: Normal auditory acuity. Mouth: Dentition intact. No ulcers or lesions.  No evidence of oral thrush. Lungs: Clear throughout to auscultation. Heart: Regular rate and rhythm, no murmur. Abdomen: Soft, nondistended.  Mild epigastric tenderness.  No masses or hepatomegaly. Normal bowel sounds x 4 quadrants.  Rectal: Deferred. Musculoskeletal: Symmetrical with no gross deformities. Extremities: No edema. Neurological: Alert oriented x 4. No focal deficits.  Psychological: Alert and cooperative. Normal mood and affect  Assessment and Recommendations: 73.  60 year old female with a history of GERD, dysphagia presents with worsening dysphagia symptoms and reports passing soft black stool for the past 2 to 3 days.  -Continue Nexium 40 mg p.o. twice daily -CBC, CMP -EGD to rule out PUD/ulcerative esophagitis vs candidiasis esophagitis. EGD benefits and risks discussed including risk with sedation, risk of bleeding,  perforation and infection.  EGD will be expedited if her laboratory studies show anemia or if she continues to have black stools. -Patient to hold Phentermine for 10 days prior to her EGD  2. Vomiting episodes, query nasal/sinus mucous in throat results in gagging then vomits yellow mucous like emesis.  -Continue follow-up with ENT for sinus drainage management -EGD as ordered above, rule out gastritis -Discussed abdominal imaging if vomiting persists  3.  Chronic vocal cord disorder -Continue follow-up with ENT  4. History of a tubular adenomatous colon per colonoscopy 01/2018 -Next colonoscopy due 01/2024  5. Fatty liver at risk for fibrosis/cirrhosis  -Continue weight management with bariatric specialist at [facility]

## 2020-08-14 NOTE — Patient Instructions (Addendum)
If you are age 60 or younger, your body mass index should be between 19-25. Your Body mass index is 32.34 kg/m. If this is out of the aformentioned range listed, please consider follow up with your Primary Care Provider.   The [facility] providers would like to encourage you to use [facility] to communicate with providers for non-urgent requests or questions.  Due to long hold times on the telephone, sending your provider a message by [facility] may be faster and more efficient way to get a response. Please allow 48 business hours for a response.  Please remember that this is for non-urgent requests/questions.  LABS:  Lab work has been ordered for you today. Our lab is located in the basement. Press "B" on the elevator. The lab is located at the first door on the left as you exit the elevator.  HEALTHCARE LAWS AND MY CHART RESULTS: Due to recent changes in healthcare laws, you may see the results of your imaging and laboratory studies on MyChart before your provider has had a chance to review them.   We understand that in some cases there may be results that are confusing or concerning to you. Not all laboratory results come back in the same time frame and the provider may be waiting for multiple results in order to interpret others.  Please give [place] 48 hours in order for your provider to thoroughly review all the results before contacting the office for clarification of your results.   PROCEDURES: You have been scheduled for a EGD. Please follow the written instructions given to you at your visit today. If you use inhalers (even only as needed), please bring them with you on the day of your procedure. STOP PHENTERMINE-TOPIRAMATE 10 DAYS BEFORE YOUR PROCEDURE.  Continue Nexium 40 MG twice a day. Please call our office if your symptoms worsen.  It was great seeing you today! Thank you for entrusting me with your care and choosing [facility].  Noralyn Pick, CRNP

## 2020-08-16 DIAGNOSIS — M5136 Other intervertebral disc degeneration, lumbar region: Secondary | ICD-10-CM | POA: Diagnosis not present

## 2020-08-16 DIAGNOSIS — M797 Fibromyalgia: Secondary | ICD-10-CM | POA: Diagnosis not present

## 2020-08-16 DIAGNOSIS — M5030 Other cervical disc degeneration, unspecified cervical region: Secondary | ICD-10-CM | POA: Diagnosis not present

## 2020-08-16 DIAGNOSIS — F413 Other mixed anxiety disorders: Secondary | ICD-10-CM | POA: Diagnosis not present

## 2020-08-16 DIAGNOSIS — M533 Sacrococcygeal disorders, not elsewhere classified: Secondary | ICD-10-CM | POA: Diagnosis not present

## 2020-08-17 DIAGNOSIS — J019 Acute sinusitis, unspecified: Secondary | ICD-10-CM | POA: Diagnosis not present

## 2020-08-17 DIAGNOSIS — R0982 Postnasal drip: Secondary | ICD-10-CM | POA: Diagnosis not present

## 2020-08-17 DIAGNOSIS — H6593 Unspecified nonsuppurative otitis media, bilateral: Secondary | ICD-10-CM | POA: Diagnosis not present

## 2020-08-21 ENCOUNTER — Telehealth: Payer: Self-pay | Admitting: Nurse Practitioner

## 2020-08-21 NOTE — Telephone Encounter (Signed)
Inbound call from patient requesting all lab results be sent to PCP Dr. Aura Dials.

## 2020-08-21 NOTE — Progress Notes (Signed)
Patient aware of results. Copy of labs faxed.

## 2020-08-21 NOTE — Telephone Encounter (Signed)
Per the patient request, her Cmet and CBC have been faxed to Dr Krystal Eaton.

## 2020-08-22 DIAGNOSIS — N816 Rectocele: Secondary | ICD-10-CM | POA: Diagnosis not present

## 2020-08-22 DIAGNOSIS — K5641 Fecal impaction: Secondary | ICD-10-CM | POA: Diagnosis not present

## 2020-08-22 DIAGNOSIS — N814 Uterovaginal prolapse, unspecified: Secondary | ICD-10-CM | POA: Diagnosis not present

## 2020-08-22 DIAGNOSIS — N3941 Urge incontinence: Secondary | ICD-10-CM | POA: Diagnosis not present

## 2020-08-23 DIAGNOSIS — N3941 Urge incontinence: Secondary | ICD-10-CM | POA: Diagnosis not present

## 2020-08-28 DIAGNOSIS — N3941 Urge incontinence: Secondary | ICD-10-CM | POA: Diagnosis not present

## 2020-08-30 DIAGNOSIS — M533 Sacrococcygeal disorders, not elsewhere classified: Secondary | ICD-10-CM | POA: Diagnosis not present

## 2020-08-30 DIAGNOSIS — M797 Fibromyalgia: Secondary | ICD-10-CM | POA: Diagnosis not present

## 2020-08-30 DIAGNOSIS — M5136 Other intervertebral disc degeneration, lumbar region: Secondary | ICD-10-CM | POA: Diagnosis not present

## 2020-08-30 DIAGNOSIS — F413 Other mixed anxiety disorders: Secondary | ICD-10-CM | POA: Diagnosis not present

## 2020-08-30 DIAGNOSIS — M5030 Other cervical disc degeneration, unspecified cervical region: Secondary | ICD-10-CM | POA: Diagnosis not present

## 2020-08-30 NOTE — Procedures (Unsigned)
   NAME: Tracey Rodriguez DATE OF BIRTH:  December 25, 1960 MEDICAL RECORD NUMBER [id number]  LOCATION: [facility]  PHYSICIAN: Marius Ditch  DATE OF STUDY: 08/10/2020  SLEEP STUDY TYPE: Nocturnal Polysomnogram               REFERRING PHYSICIAN: Marius Ditch, MD  EPWORTH SLEEPINESS SCORE:  0 HEIGHT: 5\' 7"  (170.2 cm)  WEIGHT: 216 lb (98 kg)    Body mass index is 33.83 kg/m.  NECK SIZE: 16 in.  CLINICAL INFORMATION The patient was referred to the sleep center for NPSG. She had a HSAT that showed no evidence of sleep disordered breathing. However, she had some periods of hypoxemia. She then had two nights of overnight oximetry which again confirmed nocturnal hypoxemia. This test is done to re-evaluate oxygenation and check for sleep disordered breathing. Indications include excessive daytime sleepiness, fatigue and narrowed oropharynx. She has been evaluated for pulmonary disease by Dr. Lamonte Sakai.   MEDICATIONS Patient self administered medications include: ALUMINA W/MAGNESIUM CARBONATE. No sleep medicine administered.Marland Kitchen  SLEEP STUDY TECHNIQUE A multi-channel overnight Polysomnography study was performed. The channels recorded and monitored were central and occipital EEG, electrooculogram (EOG), submentalis EMG (chin), nasal and oral airflow, thoracic and abdominal wall motion, anterior tibialis EMG, snore microphone, electrocardiogram, and a pulse oximetry.  TECHNICAL COMMENTS Comments added by Technician: THE PATIENT WAS RESTLESS Comments added by Scorer: N/A  SLEEP ARCHITECTURE The study was initiated at 10:04:06 PM and terminated at 4:57:41 AM. The total recorded time was 413.6 minutes. EEG confirmed total sleep time was 300.5 minutes yielding a sleep efficiency of 72.7%%. Sleep onset after lights out was 42.0 minutes with a REM latency of 102.5 minutes. The patient spent 3.5%% of the night in stage N1 sleep, 81.4%% in stage N2 sleep, 0.0%% in stage N3 and 15.1% in  REM. Wake after sleep onset (WASO) was 71.0 minutes. The Arousal Index was 5.8/hour.  RESPIRATORY PARAMETERS There were a total of 3 respiratory disturbances out of which 0 were apneas ( 0 obstructive, 0 mixed, 0 central) and 3 hypopneas. The apnea/hypopnea index (AHI) was 0.6 events/hour. The central sleep apnea index was 0 events/hour. The REM AHI was 2.6 events/hour and NREM AHI was 0.2 events/hour. The supine AHI was N/A events/hour and the non supine AHI was 0.6 events/hour. She slept supine during 0.00% of sleep. Respiratory disturbance index equaled AHI. Respiratory disturbances were associated with oxygen desaturation down to a nadir of 89.0% during sleep. The mean oxygen saturation during the study was 93.5%. The cumulative time under 88% oxygen saturation was 0 minutes.  LEG MOVEMENT DATA The total leg movements were 0 with a resulting leg movement index of 0.0/hr . Associated arousal with leg movement index was 0.0/hr.  CARDIAC DATA The underlying cardiac rhythm was most consistent with sinus rhythm. Mean heart rate during sleep was 79.7 bpm. Additional rhythm abnormalities include None.  IMPRESSIONS - No Significant Obstructive Sleep apnea(OSA) - No hypoxemia.   DIAGNOSIS - Excessive daytime sleepiness.   RECOMMENDATIONS - There does not appear to be an indication for treatment of sleep disordered breathing for this patient.   Marius Ditch Sleep specialist, [place] [employer]  ELECTRONICALLY SIGNED ON:  08/30/2020, 9:57 PM [place] PH: [phone]   FX: [phone] [place]

## 2020-09-04 DIAGNOSIS — N3281 Overactive bladder: Secondary | ICD-10-CM | POA: Diagnosis not present

## 2020-09-04 DIAGNOSIS — N3941 Urge incontinence: Secondary | ICD-10-CM | POA: Diagnosis not present

## 2020-09-05 DIAGNOSIS — M797 Fibromyalgia: Secondary | ICD-10-CM | POA: Diagnosis not present

## 2020-09-05 DIAGNOSIS — M5136 Other intervertebral disc degeneration, lumbar region: Secondary | ICD-10-CM | POA: Diagnosis not present

## 2020-09-05 DIAGNOSIS — M5030 Other cervical disc degeneration, unspecified cervical region: Secondary | ICD-10-CM | POA: Diagnosis not present

## 2020-09-05 DIAGNOSIS — M533 Sacrococcygeal disorders, not elsewhere classified: Secondary | ICD-10-CM | POA: Diagnosis not present

## 2020-09-10 DIAGNOSIS — R609 Edema, unspecified: Secondary | ICD-10-CM | POA: Diagnosis not present

## 2020-09-10 DIAGNOSIS — N3946 Mixed incontinence: Secondary | ICD-10-CM | POA: Diagnosis not present

## 2020-09-10 DIAGNOSIS — N3281 Overactive bladder: Secondary | ICD-10-CM | POA: Diagnosis not present

## 2020-09-10 DIAGNOSIS — I10 Essential (primary) hypertension: Secondary | ICD-10-CM | POA: Diagnosis not present

## 2020-09-10 DIAGNOSIS — E785 Hyperlipidemia, unspecified: Secondary | ICD-10-CM | POA: Diagnosis not present

## 2020-09-10 DIAGNOSIS — K760 Fatty (change of) liver, not elsewhere classified: Secondary | ICD-10-CM | POA: Diagnosis not present

## 2020-09-10 DIAGNOSIS — R198 Other specified symptoms and signs involving the digestive system and abdomen: Secondary | ICD-10-CM | POA: Diagnosis not present

## 2020-09-10 DIAGNOSIS — M797 Fibromyalgia: Secondary | ICD-10-CM | POA: Diagnosis not present

## 2020-09-10 DIAGNOSIS — K219 Gastro-esophageal reflux disease without esophagitis: Secondary | ICD-10-CM | POA: Diagnosis not present

## 2020-09-10 DIAGNOSIS — R7303 Prediabetes: Secondary | ICD-10-CM | POA: Diagnosis not present

## 2020-09-10 DIAGNOSIS — J449 Chronic obstructive pulmonary disease, unspecified: Secondary | ICD-10-CM | POA: Diagnosis not present

## 2020-09-10 DIAGNOSIS — G8929 Other chronic pain: Secondary | ICD-10-CM | POA: Diagnosis not present

## 2020-09-10 DIAGNOSIS — K9289 Other specified diseases of the digestive system: Secondary | ICD-10-CM | POA: Diagnosis not present

## 2020-09-10 DIAGNOSIS — J984 Other disorders of lung: Secondary | ICD-10-CM | POA: Diagnosis not present

## 2020-09-10 DIAGNOSIS — N838 Other noninflammatory disorders of ovary, fallopian tube and broad ligament: Secondary | ICD-10-CM | POA: Diagnosis not present

## 2020-09-10 DIAGNOSIS — D259 Leiomyoma of uterus, unspecified: Secondary | ICD-10-CM | POA: Diagnosis not present

## 2020-09-10 DIAGNOSIS — N816 Rectocele: Secondary | ICD-10-CM | POA: Diagnosis not present

## 2020-09-10 DIAGNOSIS — D682 Hereditary deficiency of other clotting factors: Secondary | ICD-10-CM | POA: Diagnosis not present

## 2020-09-10 DIAGNOSIS — N812 Incomplete uterovaginal prolapse: Secondary | ICD-10-CM | POA: Diagnosis not present

## 2020-09-10 HISTORY — PX: ANTERIOR (CYSTOCELE) AND POSTERIOR REPAIR (RECTOCELE) WITH XENFORM GRAFT AND SACROSPINOUS FIXATION: SHX6492

## 2020-09-10 HISTORY — PX: BILATERAL SALPINGOOPHORECTOMY: SHX1223

## 2020-09-10 HISTORY — PX: VAGINAL HYSTERECTOMY: SUR661

## 2020-09-11 DIAGNOSIS — N816 Rectocele: Secondary | ICD-10-CM | POA: Diagnosis not present

## 2020-09-11 DIAGNOSIS — R7303 Prediabetes: Secondary | ICD-10-CM | POA: Diagnosis not present

## 2020-09-11 DIAGNOSIS — J984 Other disorders of lung: Secondary | ICD-10-CM | POA: Diagnosis not present

## 2020-09-11 DIAGNOSIS — R609 Edema, unspecified: Secondary | ICD-10-CM | POA: Diagnosis not present

## 2020-09-11 DIAGNOSIS — N3946 Mixed incontinence: Secondary | ICD-10-CM | POA: Diagnosis not present

## 2020-09-11 DIAGNOSIS — K219 Gastro-esophageal reflux disease without esophagitis: Secondary | ICD-10-CM | POA: Diagnosis not present

## 2020-09-11 DIAGNOSIS — N3281 Overactive bladder: Secondary | ICD-10-CM | POA: Diagnosis not present

## 2020-09-11 DIAGNOSIS — K9289 Other specified diseases of the digestive system: Secondary | ICD-10-CM | POA: Diagnosis not present

## 2020-09-11 DIAGNOSIS — D682 Hereditary deficiency of other clotting factors: Secondary | ICD-10-CM | POA: Diagnosis not present

## 2020-09-11 DIAGNOSIS — I10 Essential (primary) hypertension: Secondary | ICD-10-CM | POA: Diagnosis not present

## 2020-09-11 DIAGNOSIS — E785 Hyperlipidemia, unspecified: Secondary | ICD-10-CM | POA: Diagnosis not present

## 2020-09-11 DIAGNOSIS — J449 Chronic obstructive pulmonary disease, unspecified: Secondary | ICD-10-CM | POA: Diagnosis not present

## 2020-09-11 DIAGNOSIS — N812 Incomplete uterovaginal prolapse: Secondary | ICD-10-CM | POA: Diagnosis not present

## 2020-09-11 DIAGNOSIS — G8929 Other chronic pain: Secondary | ICD-10-CM | POA: Diagnosis not present

## 2020-09-11 DIAGNOSIS — M797 Fibromyalgia: Secondary | ICD-10-CM | POA: Diagnosis not present

## 2020-09-11 DIAGNOSIS — K760 Fatty (change of) liver, not elsewhere classified: Secondary | ICD-10-CM | POA: Diagnosis not present

## 2020-09-18 DIAGNOSIS — R399 Unspecified symptoms and signs involving the genitourinary system: Secondary | ICD-10-CM | POA: Diagnosis not present

## 2020-09-25 ENCOUNTER — Encounter: Payer: BC Managed Care – PPO | Admitting: Internal Medicine

## 2020-10-03 DIAGNOSIS — M25532 Pain in left wrist: Secondary | ICD-10-CM | POA: Diagnosis not present

## 2020-10-03 DIAGNOSIS — R739 Hyperglycemia, unspecified: Secondary | ICD-10-CM | POA: Diagnosis not present

## 2020-10-03 DIAGNOSIS — Z8742 Personal history of other diseases of the female genital tract: Secondary | ICD-10-CM | POA: Diagnosis not present

## 2020-10-03 DIAGNOSIS — R8290 Unspecified abnormal findings in urine: Secondary | ICD-10-CM | POA: Diagnosis not present

## 2020-10-03 DIAGNOSIS — N952 Postmenopausal atrophic vaginitis: Secondary | ICD-10-CM | POA: Diagnosis not present

## 2020-10-03 DIAGNOSIS — R82998 Other abnormal findings in urine: Secondary | ICD-10-CM | POA: Diagnosis not present

## 2020-10-03 DIAGNOSIS — M79642 Pain in left hand: Secondary | ICD-10-CM | POA: Diagnosis not present

## 2020-10-03 DIAGNOSIS — M79641 Pain in right hand: Secondary | ICD-10-CM | POA: Diagnosis not present

## 2020-10-03 DIAGNOSIS — Z48816 Encounter for surgical aftercare following surgery on the genitourinary system: Secondary | ICD-10-CM | POA: Diagnosis not present

## 2020-10-03 DIAGNOSIS — Z9889 Other specified postprocedural states: Secondary | ICD-10-CM | POA: Diagnosis not present

## 2020-10-04 DIAGNOSIS — M5030 Other cervical disc degeneration, unspecified cervical region: Secondary | ICD-10-CM | POA: Diagnosis not present

## 2020-10-04 DIAGNOSIS — M533 Sacrococcygeal disorders, not elsewhere classified: Secondary | ICD-10-CM | POA: Diagnosis not present

## 2020-10-04 DIAGNOSIS — M797 Fibromyalgia: Secondary | ICD-10-CM | POA: Diagnosis not present

## 2020-10-04 DIAGNOSIS — M5136 Other intervertebral disc degeneration, lumbar region: Secondary | ICD-10-CM | POA: Diagnosis not present

## 2020-10-09 ENCOUNTER — Telehealth: Payer: Self-pay | Admitting: Interventional Cardiology

## 2020-10-09 DIAGNOSIS — R609 Edema, unspecified: Secondary | ICD-10-CM

## 2020-10-09 DIAGNOSIS — E782 Mixed hyperlipidemia: Secondary | ICD-10-CM

## 2020-10-09 DIAGNOSIS — Z8249 Family history of ischemic heart disease and other diseases of the circulatory system: Secondary | ICD-10-CM

## 2020-10-09 DIAGNOSIS — I10 Essential (primary) hypertension: Secondary | ICD-10-CM

## 2020-10-09 NOTE — Telephone Encounter (Signed)
Spoke with the patient who reports that she has been having increased swelling in her feet and legs. She states that sometimes her face swells as well. She has gained about 30 lbs this year. She states that she does not feel short of breath but her friend has told her that she sounds SOB at times. She is avoiding salt in her diet. She is elevating her feet as often as she can. She does not take her BP at home which I have advised her to start doing. She is prescribed lasix 20 mg as needed. She states that Dr. Irish Lack told her she could take 40 mg as needed. She states that last week she took lasix 40 mg about 4 days and it did not help her swelling. Advised her to take Lasix daily to see if swelling comes down. She does try to eat potassium rich foods whenever she takes her lasix.  Reports that she recently had a hysterectomy and does get some bloating. She has been urinating fine.

## 2020-10-09 NOTE — Telephone Encounter (Signed)
Pt c/o swelling: STAT is pt has developed SOB within 24 hours  If swelling, where is the swelling located? Face, feet, and ankles  How much weight have you gained and in what time span? 37 lbs since December, swelling has been going on for a while  Have you gained 3 pounds in a day or 5 pounds in a week? States she can gain anywhere from 3-5 lbs in a day  Do you have a log of your daily weights (if so, list)? 192 lbs thanksgiving, now 226-230 lbs, never weighed this much  Are you currently taking a fluid pill? Yes, takes 2 for swelling but hasn't helped with feet and ankles  Are you currently SOB? no  Have you traveled recently? no   Patient states she has had swelling in her face, feet, and ankles. She says since Thanksgiving she has gained 37 lbs. She states she is on a fluid pill, but it doesn't seem to be helping with the swelling. She says she did have a hysterectomy and now sometimes under her breast in her stomach she has bloating. She states it is sometimes "as hard as a rock". She says she has elevated her feet while sleeping. She also says she gets some SOB, but was not currently SOB while on the phone. She says she is going to the bathroom fine.

## 2020-10-09 NOTE — Telephone Encounter (Signed)
I agree with trying the furosemide.    If swelling persists, could obtain echo to eval LV function.

## 2020-10-10 NOTE — Telephone Encounter (Signed)
Left message for patient to call back if swelling does not improve.

## 2020-10-11 NOTE — Progress Notes (Signed)
Office Visit Note  Patient: Tracey Rodriguez             Date of Birth: 21-Mar-1960           MRN: [id number]             PCP: Aura Dials, MD Referring: Aura Dials, MD Visit Date: 10/25/2020 Occupation: '@GUAROCC'$ @  Subjective:  Pain in both knee joints   History of Present Illness: Tracey Rodriguez is a 60 y.o. female with history of fibromyalgia, osteoarthritis, DDD.  Patient reports that she had a hysterectomy on 09/10/2020 without any complications.  She states that while hospitalized she started to have increased pain in her neck and lower back while in the hospital bed.  She states that she has not seen her back specialist at [facility] recently.  She previously had injections in her back but with the last few injections she did not notice any improvement in her symptoms.  She states over the past several months has been experiencing increased pain in both knee joints.  She is having difficulty climbing steps as well as rising from a seated position.  She denies any swelling in her knee joints at this time.  She states she is also having discomfort on the lateral aspect of both hips due to trochanter bursitis.  She goes to integrative therapies on occasion which alleviates some of her discomfort.  She takes methocarbamol 500 mg twice daily as needed for muscle spasms.    Activities of Daily Living:  Patient reports morning stiffness for all day. Patient Reports nocturnal pain.  Difficulty dressing/grooming: Reports Difficulty climbing stairs: Reports Difficulty getting out of chair: Reports Difficulty using hands for taps, buttons, cutlery, and/or writing: Reports  Review of Systems  Constitutional:  Positive for fatigue.  HENT:  Positive for mouth dryness. Negative for mouth sores and nose dryness.   Eyes:  Positive for dryness. Negative for pain, itching and visual disturbance.  Respiratory:  Positive for shortness of breath. Negative for cough, hemoptysis and  difficulty breathing.   Cardiovascular:  Negative for chest pain, palpitations, hypertension and swelling in legs/feet.  Gastrointestinal:  Negative for blood in stool, constipation and diarrhea.  Endocrine: Positive for increased urination.  Genitourinary:  Negative for difficulty urinating and painful urination.  Musculoskeletal:  Positive for joint pain, joint pain, myalgias, morning stiffness, muscle tenderness and myalgias. Negative for joint swelling and muscle weakness.  Skin:  Positive for rash. Negative for pallor, hair loss, nodules/bumps, skin tightness, ulcers and sensitivity to sunlight.  Neurological:  Positive for dizziness. Negative for numbness, headaches, memory loss and weakness.  Hematological:  Positive for bruising/bleeding tendency. Negative for swollen glands.  Psychiatric/Behavioral:  Negative for depressed mood, confusion and sleep disturbance. The patient is not nervous/anxious.    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   Vocal cord dysfunction 05/21/2020   Chronic cough 04/19/2020   Sensorineural hearing loss (SNHL), bilateral 04/04/2020   Osteopenia 12/01/2019   NAFLD (nonalcoholic fatty liver disease) 09/29/2019   Obesity (BMI 30.0-34.9) 09/29/2019   Hyperlipidemia 09/09/2018   Prediabetes 09/09/2018   Hx of adenomatous polyp of colon 02/04/2018   Rectocele 01/27/2018   Change in bowel habits 01/05/2018   Hemorrhoids 01/05/2018   Hoarseness 07/30/2017   Gastroesophageal reflux disease 07/30/2017   Pure hypercholesterolemia 06/03/2017   History of breast cancer 09/11/2016   Other fatigue 03/15/2016   Primary insomnia  03/15/2016   Primary osteoarthritis of both hands 03/15/2016   DDD cervical spine 03/15/2016   Osteoarthritis of lumbar spine 03/15/2016   History of bilateral carpal tunnel release 03/15/2016   Unspecified hypothyroidism 03/30/2013    Fibromyalgia 03/30/2013   Breast cancer ([place]) 03/30/2013   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   Dyspnea 02/02/2013   Restrictive lung disease 02/02/2013    Past Medical History:  Diagnosis Date   Arthritis    deg disc disease  - lower back, neck   Cancer ([place])    left breast surgery-lumpectomy   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 ([place])    Fibromyalgia    GERD (gastroesophageal reflux disease)    Heart murmur    Hx of adenomatous polyp of colon 02/04/2018   Hyperlipidemia    Hypothyroidism    goiter   NAFLD (nonalcoholic fatty liver disease)    Neuromuscular disorder ([facility])    sciatic nerve   PONV (postoperative nausea and vomiting)    Rectocele 01/27/2018   SVD (spontaneous vaginal delivery)    x 2    Family History  Problem Relation Age of Onset   Breast cancer Mother 92   Ovarian cancer Mother    Heart disease Father 37   Heart attack Father    Clotting disorder Father    Rheumatologic disease Father    Crohn's disease Father    Heart attack Brother    Cancer Brother    Lung cancer Brother    Colon polyps Brother    Throat cancer Brother    Colon polyps Brother    Stroke Brother    Breast cancer Maternal Aunt 60   Stroke Paternal Aunt    Heart disease Paternal Aunt    Kidney disease Paternal Uncle    Liver cancer Maternal Grandmother    Heart disease Maternal Grandfather    Lung cancer Maternal Grandfather    Irritable bowel syndrome Son    Past Surgical History:  Procedure Laterality Date   ABLATION     APPENDECTOMY     BREAST EXCISIONAL BIOPSY Left    BREAST SURGERY     left- lumpectomy   DILATATION & CURRETTAGE/HYSTEROSCOPY WITH RESECTOCOPE N/A 04/05/2012   Procedure: hysteroscopy with endocervical curretting;  Surgeon: Allyn Kenner, DO;  Location: [facility];  Service: Gynecology;  Laterality: N/A;   DILATION AND CURETTAGE OF UTERUS     EYE SURGERY Bilateral 10/2017   cataract extraction    LIVER BIOPSY  09/2018   NOSE SURGERY      VAGINAL HYSTERECTOMY  09/10/2020   Social History   Social History Narrative   She is married, has children.  They are grown.  1 son and 1 daughter has at least 1 grandchild   Former smoker no significant alcohol no drug use   Immunization History  Administered Date(s) Administered   Hepatitis A, Adult 04/20/2014, 10/25/2014   Influenza Split 11/14/2008, 03/04/2011, 03/16/2012, 11/17/2012, 12/10/2017   Influenza,inj,Quad PF,6+ Mos 12/19/2016, 01/05/2018, 11/19/2018, 02/01/2020   Influenza,inj,quad, With Preservative 03/07/2014   Influenza-Unspecified 01/26/2014, 11/24/2016, 01/23/2019   PFIZER(Purple Top)SARS-COV-2 Vaccination 04/27/2019, 05/18/2019   Pneumococcal Conjugate-13 03/07/2014   Pneumococcal Polysaccharide-23 02/01/2020   Td 04/28/2005   Tdap 10/10/2014     Objective: Vital Signs: BP (!) 159/90 (BP Location: Left Arm, Patient Position: Sitting, Cuff Size: Normal)   Pulse 88   Ht '5\' 6"'$  (1.676 m)   Wt 231 lb 9.6 oz (105.1 kg)   BMI 37.38 kg/m  Physical Exam Vitals and nursing note reviewed.  Constitutional:      Appearance: She is well-developed.  HENT:     Head: Normocephalic and atraumatic.  Eyes:     Conjunctiva/sclera: Conjunctivae normal.  Pulmonary:     Effort: Pulmonary effort is normal.  Abdominal:     Palpations: Abdomen is soft.  Musculoskeletal:     Cervical back: Normal range of motion.  Skin:    General: Skin is warm and dry.     Capillary Refill: Capillary refill takes less than 2 seconds.  Neurological:     Mental Status: She is alert and oriented to person, place, and time.  Psychiatric:        Behavior: Behavior normal.     Musculoskeletal Exam: Generalized hyperalgesia and positive tender points on exam.  C-spine is limited range of motion with lateral rotation.  Trapezius muscle tension and tenderness bilaterally.  Painful range of motion lumbar spine.  Shoulder joints, elbow joints, wrist joints, MCPs, PIPs, DIPs have good range of  motion with no synovitis.  PIP and DIP prominence consistent with osteoarthritis of both hands.  Complete fist formation bilaterally.  No tenderness or synovitis over MCP joints.  Hip joints have good range of motion.  Tenderness over bilateral trochanteric bursa.  Knee joints have painful range of motion but no warmth or effusion was noted.  Ankle joints have good range of motion with no tenderness or joint swelling.  CDAI Exam: CDAI Score: -- Patient Global: --; Provider Global: -- Swollen: --; Tender: -- Joint Exam 10/25/2020   No joint exam has been documented for this visit   There is currently no information documented on the homunculus. Go to the Rheumatology activity and complete the homunculus joint exam.  Investigation: No additional findings.  Imaging: ECHOCARDIOGRAM COMPLETE  Result Date: 10/17/2020    ECHOCARDIOGRAM REPORT   Patient Name:   DERRICKA FROESE Date of Exam: 10/17/2020 Medical Rec #:  [id number]         Height:       69.0 in Accession #:    [id number]        Weight:       219.0 lb Date of Birth:  November 26, 1960         BSA:          2.147 m Patient Age:    80 years          BP:           136/74 mmHg Patient Gender: F                 HR:           94 bpm. Exam Location:  Outpatient Procedure: 2D Echo, Cardiac Doppler and Color Doppler Indications:    Edema  History:        Patient has prior history of Echocardiogram examinations, most                 recent 09/09/2018. COPD, Signs/Symptoms:Shortness of Breath and                 Edema; Risk Factors:Hypertension, Former Smoker, Dyslipidemia                 and Morbid obesity.  Sonographer:    Dustin Flock RDCS Referring Phys: [phone] Jettie Booze  Sonographer Comments: Technically challenging study due to limited acoustic windows and patient is morbidly obese. IMPRESSIONS  1. Left ventricular ejection fraction, by estimation, is 65  to 70%. The left ventricle has normal function. The left ventricle has no regional wall  motion abnormalities. Left ventricular diastolic parameters were normal.  2. Right ventricular systolic function is normal. The right ventricular size is normal. Tricuspid regurgitation signal is inadequate for assessing PA pressure.  3. The mitral valve is normal in structure. No evidence of mitral valve regurgitation. No evidence of mitral stenosis.  4. The aortic valve is normal in structure. Aortic valve regurgitation is not visualized. No aortic stenosis is present.  5. The inferior vena cava is normal in size with <50% respiratory variability, suggesting right atrial pressure of 8 mmHg. FINDINGS  Left Ventricle: Left ventricular ejection fraction, by estimation, is 65 to 70%. The left ventricle has normal function. The left ventricle has no regional wall motion abnormalities. The left ventricular internal cavity size was normal in size. There is  no left ventricular hypertrophy. Left ventricular diastolic parameters were normal. Normal left ventricular filling pressure. Right Ventricle: The right ventricular size is normal. No increase in right ventricular wall thickness. Right ventricular systolic function is normal. Tricuspid regurgitation signal is inadequate for assessing PA pressure. Left Atrium: Left atrial size was normal in size. Right Atrium: Right atrial size was normal in size. Pericardium: There is no evidence of pericardial effusion. Mitral Valve: The mitral valve is normal in structure. No evidence of mitral valve regurgitation. No evidence of mitral valve stenosis. Tricuspid Valve: The tricuspid valve is normal in structure. Tricuspid valve regurgitation is not demonstrated. No evidence of tricuspid stenosis. Aortic Valve: The aortic valve is normal in structure. Aortic valve regurgitation is not visualized. No aortic stenosis is present. Pulmonic Valve: The pulmonic valve was normal in structure. Pulmonic valve regurgitation is not visualized. No evidence of pulmonic stenosis. Aorta: The aortic  root is normal in size and structure. Venous: The inferior vena cava is normal in size with less than 50% respiratory variability, suggesting right atrial pressure of 8 mmHg. IAS/Shunts: No atrial level shunt detected by color flow Doppler.  LEFT VENTRICLE PLAX 2D LVIDd:         4.50 cm     Diastology LVIDs:         2.10 cm     LV e' medial:    9.14 cm/s LV PW:         1.10 cm     LV E/e' medial:  9.9 LV IVS:        1.00 cm     LV e' lateral:   9.36 cm/s LVOT diam:     2.10 cm     LV E/e' lateral: 9.7 LV SV:         80 LV SV Index:   37 LVOT Area:     3.46 cm  LV Volumes (MOD) LV vol d, MOD A4C: 83.7 ml LV vol s, MOD A4C: 30.3 ml LV SV MOD A4C:     83.7 ml RIGHT VENTRICLE RV Basal diam:  2.10 cm RV S prime:     16.20 cm/s TAPSE (M-mode): 1.8 cm LEFT ATRIUM             Index       RIGHT ATRIUM           Index LA diam:        3.10 cm 1.44 cm/m  RA Area:     14.90 cm LA Vol (A2C):   42.1 ml 19.61 ml/m RA Volume:   36.70 ml  17.09 ml/m LA Vol (A4C):  34.1 ml 15.88 ml/m LA Biplane Vol: 38.0 ml 17.70 ml/m  AORTIC VALVE LVOT Vmax:   114.00 cm/s LVOT Vmean:  85.200 cm/s LVOT VTI:    0.230 m  AORTA Ao Root diam: 2.90 cm MITRAL VALVE MV Area (PHT): 3.97 cm    SHUNTS MV Decel Time: 191 msec    Systemic VTI:  0.23 m MV E velocity: 90.60 cm/s  Systemic Diam: 2.10 cm MV A velocity: 94.80 cm/s MV E/A ratio:  0.96 Fransico Him MD Electronically signed by Fransico Him MD Signature Date/Time: 10/17/2020/8:50:12 PM    Final    SLEEP STUDY DOCUMENTS  Result Date: 10/08/2020 Ordered by an unspecified provider.   Recent Labs: Lab Results  Component Value Date   WBC 10.9 (H) 08/14/2020   HGB 13.0 08/14/2020   PLT 388.0 08/14/2020   NA 139 08/14/2020   K 4.1 08/14/2020   CL 104 08/14/2020   CO2 27 08/14/2020   GLUCOSE 96 08/14/2020   BUN 22 08/14/2020   CREATININE 1.42 (H) 08/14/2020   BILITOT 0.3 08/14/2020   ALKPHOS 67 08/14/2020   AST 21 08/14/2020   ALT 30 08/14/2020   PROT 7.2 08/14/2020   ALBUMIN 4.1  08/14/2020   CALCIUM 9.3 08/14/2020   GFRAA 83 09/08/2018    Speciality Comments: No specialty comments available.  Procedures:  No procedures performed Allergies: Capsaicin, Etodolac, Amlodipine, Bee venom, Crestor [rosuvastatin], Doxycycline, Pepto-bismol [bismuth], Pork-derived products, and Sulfonamide derivatives   Assessment / Plan:     Visit Diagnoses: Fibromyalgia: She has generalized hyperalgesia and positive tender points on examination.  She remains under tremendous amount of stress which exacerbates her myalgias and muscle tenderness.  She is having trapezius muscle tension and tenderness bilaterally.  She takes methocarbamol 500 mg twice daily as needed for muscle spasms.  She has ongoing discomfort due to trochanteric bursitis of both hips.  She was previously going to integrative therapies on a regular basis but has not had an appointment in several months.  She was strongly encouraged to return to integrative therapies for massage, PT, and dry needling.  Discussed the importance of regular exercise and good sleep hygiene.  Referral to pain management will also be placed today.  She will follow up in 6 months.   Other fatigue: Chronic.  Discussed the importance of regular exercise.  Primary insomnia: She has been experiencing increased nocturnal pain which has been contributing to her insomnia.   Chronic SI joint pain: She continues to have chronic pain in both SI joints.  Her last cortisone injection was performed on 10/25/2019 which provided temporary relief.  Primary osteoarthritis of both hands: She has PIP and DIP prominence consistent with osteoarthritis of both hands.  No tenderness or inflammation was noted.  Complete fist formation bilaterally.  Discussed the importance of joint protection and muscle strengthening.  History of bilateral carpal tunnel release: Asymptomatic at this time.  She is no longer going to hand therapy.  Discussed the importance of grip strengthening  and joint protection.  Chronic pain of both knees - She has been experiencing increased pain in both knee joints over the past several months.  She has not had any recent injuries or falls.  No mechanical symptoms.  She has been having difficulty climbing steps and rising from a seated position due to discomfort in both knees.  She has not noticed any joint swelling recently.  On examination she has good range of motion with discomfort bilaterally.  Crepitus noted in both knees.  No warmth or effusion was noted.  X-rays of both knees were updated today.  Different treatment options were discussed.  She is not a good candidate for cortisone injections due to history elevated blood sugar and blood pressure after having steroid injections.  She would like to apply for Visco gel injections for both knee joints.  We also discussed the importance of lower extremity muscle strengthening.  She was given a handout of knee joint exercises to perform.  She was also strongly encouraged to go back to integrative therapies.  Referral to pain management will be placed today.  Plan: XR KNEE 3 VIEW RIGHT, XR KNEE 3 VIEW LEFT  DDD (degenerative disc disease), cervical: She continues to have chronic neck pain and stiffness.  She has limited range of motion with lateral rotation.  She has trapezius muscle tension and tenderness bilaterally.  She would benefit from massage.  She was strongly encouraged to return to integrative therapies.  A referral to pain management will be placed today.  DDD (degenerative disc disease), lumbar: Chronic pain.  She was previously following up at [facility].  She has had injections in the past which provided temporary relief.  She has not had any injections recently due to inadequate response with her last injection.  She has been using a heating pad and taking methocarbamol 500 mg twice daily for muscle spasms.  A referral to pain management will be placed today.  Other medical  conditions are listed as follows:  Factor V deficiency ([place])  History of breast cancer  History of COPD  COVID-19 virus infection - 03/06/2020  Pelvic floor dysfunction   Osteoporosis screening    Orders: Orders Placed This Encounter  Procedures   XR KNEE 3 VIEW RIGHT   XR KNEE 3 VIEW LEFT   No orders of the defined types were placed in this encounter.    Follow-Up Instructions: Return in about 6 months (around 04/24/2021) for Fibromyalgia, Osteoarthritis, DDD.   Ofilia Neas, PA-C  Note - This record has been created using Dragon software.  Chart creation errors have been sought, but may not always  have been located. Such creation errors do not reflect on  the standard of medical care.

## 2020-10-15 MED ORDER — FUROSEMIDE 20 MG PO TABS: 20.0000 mg | ORAL_TABLET | Freq: Every day | ORAL | 3 refills | Status: DC | PRN

## 2020-10-15 NOTE — Telephone Encounter (Signed)
Called patient to let her know that Dr. Irish Lack stated if swelling persist we can order ehco to evaluate LV function. Sent in new prescription for lasix, since current tablets have expired. Will send message to scheduling about echo. Patient verbalized understanding.

## 2020-10-15 NOTE — Telephone Encounter (Signed)
Pt c/o swelling: STAT is pt has developed SOB within 24 hours  If swelling, where is the swelling located? Face, feet, ankles, and sometimes hands  How much weight have you gained and in what time span? Doesn't know   Have you gained 3 pounds in a day or 5 pounds in a week? Doesn't know   Do you have a log of your daily weights (if so, list)? No   Are you currently taking a fluid pill? Yes  Are you currently SOB? No   Have you traveled recently? No   Calling to report swelling has not improved. States the furosemide she has been taking is a year old, didn't know if that could have anything to do with it not improving.

## 2020-10-17 ENCOUNTER — Ambulatory Visit (HOSPITAL_COMMUNITY)
Admission: RE | Admit: 2020-10-17 | Discharge: 2020-10-17 | Disposition: A | Discharge status: Home / Self Care | Payer: BC Managed Care – PPO | Source: Ambulatory Visit | Attending: Interventional Cardiology | Admitting: Interventional Cardiology

## 2020-10-17 ENCOUNTER — Other Ambulatory Visit: Payer: Self-pay

## 2020-10-17 DIAGNOSIS — I10 Essential (primary) hypertension: Secondary | ICD-10-CM

## 2020-10-17 DIAGNOSIS — R601 Generalized edema: Secondary | ICD-10-CM

## 2020-10-17 DIAGNOSIS — Z8249 Family history of ischemic heart disease and other diseases of the circulatory system: Secondary | ICD-10-CM

## 2020-10-17 DIAGNOSIS — R748 Abnormal levels of other serum enzymes: Secondary | ICD-10-CM | POA: Diagnosis not present

## 2020-10-17 DIAGNOSIS — E782 Mixed hyperlipidemia: Secondary | ICD-10-CM | POA: Diagnosis not present

## 2020-10-17 DIAGNOSIS — R609 Edema, unspecified: Secondary | ICD-10-CM | POA: Diagnosis not present

## 2020-10-17 DIAGNOSIS — E785 Hyperlipidemia, unspecified: Secondary | ICD-10-CM | POA: Diagnosis not present

## 2020-10-17 DIAGNOSIS — Z87891 Personal history of nicotine dependence: Secondary | ICD-10-CM | POA: Diagnosis not present

## 2020-10-17 DIAGNOSIS — J449 Chronic obstructive pulmonary disease, unspecified: Secondary | ICD-10-CM | POA: Insufficient documentation

## 2020-10-17 DIAGNOSIS — R739 Hyperglycemia, unspecified: Secondary | ICD-10-CM | POA: Diagnosis not present

## 2020-10-17 DIAGNOSIS — E039 Hypothyroidism, unspecified: Secondary | ICD-10-CM | POA: Diagnosis not present

## 2020-10-17 LAB — ECHOCARDIOGRAM COMPLETE
Area-P 1/2: 3.97 cm2
S' Lateral: 2.1 cm
Single Plane A4C EF: 63.8 %

## 2020-10-17 NOTE — Progress Notes (Signed)
  Echocardiogram 2D Echocardiogram has been performed.  Tracey Rodriguez 10/17/2020, 1:27 PM

## 2020-10-25 ENCOUNTER — Ambulatory Visit (INDEPENDENT_AMBULATORY_CARE_PROVIDER_SITE_OTHER): Payer: BC Managed Care – PPO | Admitting: Physician Assistant

## 2020-10-25 ENCOUNTER — Encounter: Payer: Self-pay | Admitting: Physician Assistant

## 2020-10-25 ENCOUNTER — Ambulatory Visit: Payer: Self-pay

## 2020-10-25 ENCOUNTER — Other Ambulatory Visit: Payer: Self-pay

## 2020-10-25 ENCOUNTER — Telehealth: Payer: Self-pay

## 2020-10-25 ENCOUNTER — Telehealth: Payer: Self-pay | Admitting: Nurse Practitioner

## 2020-10-25 VITALS — BP 159/90 | HR 88 | Ht 66.0 in | Wt 231.6 lb

## 2020-10-25 DIAGNOSIS — M19041 Primary osteoarthritis, right hand: Secondary | ICD-10-CM

## 2020-10-25 DIAGNOSIS — M797 Fibromyalgia: Secondary | ICD-10-CM | POA: Diagnosis not present

## 2020-10-25 DIAGNOSIS — Z13820 Encounter for screening for osteoporosis: Secondary | ICD-10-CM

## 2020-10-25 DIAGNOSIS — M25562 Pain in left knee: Secondary | ICD-10-CM

## 2020-10-25 DIAGNOSIS — Z853 Personal history of malignant neoplasm of breast: Secondary | ICD-10-CM

## 2020-10-25 DIAGNOSIS — M5030 Other cervical disc degeneration, unspecified cervical region: Secondary | ICD-10-CM

## 2020-10-25 DIAGNOSIS — F5101 Primary insomnia: Secondary | ICD-10-CM

## 2020-10-25 DIAGNOSIS — M542 Cervicalgia: Secondary | ICD-10-CM | POA: Diagnosis not present

## 2020-10-25 DIAGNOSIS — G8929 Other chronic pain: Secondary | ICD-10-CM | POA: Diagnosis not present

## 2020-10-25 DIAGNOSIS — R1084 Generalized abdominal pain: Secondary | ICD-10-CM

## 2020-10-25 DIAGNOSIS — M19042 Primary osteoarthritis, left hand: Secondary | ICD-10-CM

## 2020-10-25 DIAGNOSIS — M25561 Pain in right knee: Secondary | ICD-10-CM | POA: Diagnosis not present

## 2020-10-25 DIAGNOSIS — Z9889 Other specified postprocedural states: Secondary | ICD-10-CM

## 2020-10-25 DIAGNOSIS — U071 COVID-19: Secondary | ICD-10-CM

## 2020-10-25 DIAGNOSIS — M5136 Other intervertebral disc degeneration, lumbar region: Secondary | ICD-10-CM

## 2020-10-25 DIAGNOSIS — M6289 Other specified disorders of muscle: Secondary | ICD-10-CM

## 2020-10-25 DIAGNOSIS — Z8709 Personal history of other diseases of the respiratory system: Secondary | ICD-10-CM

## 2020-10-25 DIAGNOSIS — R5383 Other fatigue: Secondary | ICD-10-CM | POA: Diagnosis not present

## 2020-10-25 DIAGNOSIS — D682 Hereditary deficiency of other clotting factors: Secondary | ICD-10-CM

## 2020-10-25 DIAGNOSIS — M533 Sacrococcygeal disorders, not elsewhere classified: Secondary | ICD-10-CM

## 2020-10-25 NOTE — Telephone Encounter (Addendum)
Spoke with patient Pt states that she is still feeling miserable. States she "could have her stomach open and would relief," is bloated not able to pass. Has been taking Mylanta and probiotic with no improvement. Pt is having soreness under breastbone Reports this has been going for two weeks. Hurts much more when bending over. Stool is soft. Please advise, thank you.Marland Kitchen

## 2020-10-25 NOTE — Telephone Encounter (Signed)
Submitted for VOB 10/25/2020.

## 2020-10-25 NOTE — Telephone Encounter (Signed)
Please apply for bilateral knee visco, per Taylor Dale, PA-C. Thanks!  

## 2020-10-25 NOTE — Telephone Encounter (Signed)
Pt states that her stomach below her breast feels hard. She said that feeling of discomfort worsened after her hysterectomy this past July. She was referred to her GI for this sxs. She is wondering if there is anything she could take for that.

## 2020-10-25 NOTE — Patient Instructions (Signed)
Knee Exercises Ask your health care provider which exercises are safe for you. Do exercises exactly as told by your health care provider and adjust them as directed. It is normal to feel mild stretching, pulling, tightness, or discomfort as you do these exercises. Stop right away if you feel sudden pain or your pain gets worse. Do not begin these exercises until told by your health care provider. Stretching and range-of-motion exercises These exercises warm up your muscles and joints and improve the movement and flexibility of your knee. These exercises also help to relieve pain and swelling. Knee extension, prone Lie on your abdomen (prone position) on a bed. Place your left / right knee just beyond the edge of the surface so your knee is not on the bed. You can put a towel under your left / right thigh just above your kneecap for comfort. Relax your leg muscles and allow gravity to straighten your knee (extension). You should feel a stretch behind your left / right knee. Hold this position for __________ seconds. Scoot up so your knee is supported between repetitions. Repeat __________ times. Complete this exercise __________ times a day. Knee flexion, active  Lie on your back with both legs straight. If this causes back discomfort, bend your left / right knee so your foot is flat on the floor. Slowly slide your left / right heel back toward your buttocks. Stop when you feel a gentle stretch in the front of your knee or thigh (flexion). Hold this position for __________ seconds. Slowly slide your left / right heel back to the starting position. Repeat __________ times. Complete this exercise __________ times a day. Quadriceps stretch, prone  Lie on your abdomen on a firm surface, such as a bed or padded floor. Bend your left / right knee and hold your ankle. If you cannot reach your ankle or pant leg, loop a belt around your foot and grab the belt instead. Gently pull your heel toward your  buttocks. Your knee should not slide out to the side. You should feel a stretch in the front of your thigh and knee (quadriceps). Hold this position for __________ seconds. Repeat __________ times. Complete this exercise __________ times a day. Hamstring, supine Lie on your back (supine position). Loop a belt or towel over the ball of your left / right foot. The ball of your foot is on the walking surface, right under your toes. Straighten your left / right knee and slowly pull on the belt to raise your leg until you feel a gentle stretch behind your knee (hamstring). Do not let your knee bend while you do this. Keep your other leg flat on the floor. Hold this position for __________ seconds. Repeat __________ times. Complete this exercise __________ times a day. Strengthening exercises These exercises build strength and endurance in your knee. Endurance is the ability to use your muscles for a long time, even after they get tired. Quadriceps, isometric This exercise stretches the muscles in front of your thigh (quadriceps) without moving your knee joint (isometric). Lie on your back with your left / right leg extended and your other knee bent. Put a rolled towel or small pillow under your knee if told by your health care provider. Slowly tense the muscles in the front of your left / right thigh. You should see your kneecap slide up toward your hip or see increased dimpling just above the knee. This motion will push the back of the knee toward the floor. For __________   seconds, hold the muscle as tight as you can without increasing your pain. Relax the muscles slowly and completely. Repeat __________ times. Complete this exercise __________ times a day. Straight leg raises This exercise stretches the muscles in front of your thigh (quadriceps) and the muscles that move your hips (hip flexors). Lie on your back with your left / right leg extended and your other knee bent. Tense the muscles in  the front of your left / right thigh. You should see your kneecap slide up or see increased dimpling just above the knee. Your thigh may even shake a bit. Keep these muscles tight as you raise your leg 4-6 inches (10-15 cm) off the floor. Do not let your knee bend. Hold this position for __________ seconds. Keep these muscles tense as you lower your leg. Relax your muscles slowly and completely after each repetition. Repeat __________ times. Complete this exercise __________ times a day. Hamstring, isometric Lie on your back on a firm surface. Bend your left / right knee about __________ degrees. Dig your left / right heel into the surface as if you are trying to pull it toward your buttocks. Tighten the muscles in the back of your thighs (hamstring) to "dig" as hard as you can without increasing any pain. Hold this position for __________ seconds. Release the tension gradually and allow your muscles to relax completely for __________ seconds after each repetition. Repeat __________ times. Complete this exercise __________ times a day. Hamstring curls If told by your health care provider, do this exercise while wearing ankle weights. Begin with __________ lb weights. Then increase the weight by 1 lb (0.5 kg) increments. Do not wear ankle weights that are more than __________ lb. Lie on your abdomen with your legs straight. Bend your left / right knee as far as you can without feeling pain. Keep your hips flat against the floor. Hold this position for __________ seconds. Slowly lower your leg to the starting position. Repeat __________ times. Complete this exercise __________ times a day. Squats This exercise strengthens the muscles in front of your thigh and knee (quadriceps). Stand in front of a table, with your feet and knees pointing straight ahead. You may rest your hands on the table for balance but not for support. Slowly bend your knees and lower your hips like you are going to sit in a  chair. Keep your weight over your heels, not over your toes. Keep your lower legs upright so they are parallel with the table legs. Do not let your hips go lower than your knees. Do not bend lower than told by your health care provider. If your knee pain increases, do not bend as low. Hold the squat position for __________ seconds. Slowly push with your legs to return to standing. Do not use your hands to pull yourself to standing. Repeat __________ times. Complete this exercise __________ times a day. Wall slides This exercise strengthens the muscles in front of your thigh and knee (quadriceps). Lean your back against a smooth wall or door, and walk your feet out 18-24 inches (46-61 cm) from it. Place your feet hip-width apart. Slowly slide down the wall or door until your knees bend __________ degrees. Keep your knees over your heels, not over your toes. Keep your knees in line with your hips. Hold this position for __________ seconds. Repeat __________ times. Complete this exercise __________ times a day. Straight leg raises This exercise strengthens the muscles that rotate the leg at the hip and   move it away from your body (hip abductors). Lie on your side with your left / right leg in the top position. Lie so your head, shoulder, knee, and hip line up. You may bend your bottom knee to help you keep your balance. Roll your hips slightly forward so your hips are stacked directly over each other and your left / right knee is facing forward. Leading with your heel, lift your top leg 4-6 inches (10-15 cm). You should feel the muscles in your outer hip lifting. Do not let your foot drift forward. Do not let your knee roll toward the ceiling. Hold this position for __________ seconds. Slowly return your leg to the starting position. Let your muscles relax completely after each repetition. Repeat __________ times. Complete this exercise __________ times a day. Straight leg raises This  exercise stretches the muscles that move your hips away from the front of the pelvis (hip extensors). Lie on your abdomen on a firm surface. You can put a pillow under your hips if that is more comfortable. Tense the muscles in your buttocks and lift your left / right leg about 4-6 inches (10-15 cm). Keep your knee straight as you lift your leg. Hold this position for __________ seconds. Slowly lower your leg to the starting position. Let your leg relax completely after each repetition. Repeat __________ times. Complete this exercise __________ times a day. This information is not intended to replace advice given to you by your health care provider. Make sure you discuss any questions you have with your health care provider. Document Revised: 11/24/2017 Document Reviewed: 11/24/2017 Elsevier Patient Education  2022 [employer].  

## 2020-10-25 NOTE — Addendum Note (Signed)
Addended by: Francis Gaines C on: 10/25/2020 11:21 AM   Modules accepted: Orders

## 2020-10-26 NOTE — Telephone Encounter (Signed)
Edema does not appear cardiac in origin based on echo.  Cr was increased at last check.  WOuld check CMet, CBC, TSH, BNP; (edema, DOE) if not checked since June.   JV

## 2020-10-28 NOTE — Telephone Encounter (Signed)
DOD  Patient of Dr. Carlean Purl  I have reviewed last note from [place].  She was supposed to get EGD.  I guess she never did.  Plan: -Check CBC, CMP, lipase -Proceed with CT Abdo/pelvis with p.o. and IV contrast. -Also go ahead and get her scheduled for EGD with Dr. Carlean Purl or any physician who can get her earlier.  RG

## 2020-10-29 NOTE — Telephone Encounter (Signed)
Would do labs and CT first and do not schedule EGD yet

## 2020-10-30 ENCOUNTER — Other Ambulatory Visit (INDEPENDENT_AMBULATORY_CARE_PROVIDER_SITE_OTHER): Payer: BC Managed Care – PPO

## 2020-10-30 DIAGNOSIS — R1084 Generalized abdominal pain: Secondary | ICD-10-CM | POA: Diagnosis not present

## 2020-10-30 LAB — COMPREHENSIVE METABOLIC PANEL
ALT: 26 U/L (ref 0–35)
AST: 20 U/L (ref 0–37)
Albumin: 4 g/dL (ref 3.5–5.2)
Alkaline Phosphatase: 70 U/L (ref 39–117)
BUN: 18 mg/dL (ref 6–23)
CO2: 28 mEq/L (ref 19–32)
Calcium: 9.5 mg/dL (ref 8.4–10.5)
Chloride: 103 mEq/L (ref 96–112)
Creatinine, Ser: 0.8 mg/dL (ref 0.40–1.20)
GFR: 80.18 mL/min (ref >60.00)
Glucose, Bld: 96 mg/dL (ref 70–99)
Potassium: 4.2 mEq/L (ref 3.5–5.1)
Sodium: 139 mEq/L (ref 135–145)
Total Bilirubin: 0.3 mg/dL (ref 0.2–1.2)
Total Protein: 7.3 g/dL (ref 6.0–8.3)

## 2020-10-30 LAB — CBC
HCT: 37.8 % (ref 36.0–46.0)
Hemoglobin: 12.8 g/dL (ref 12.0–15.0)
MCHC: 33.9 g/dL (ref 30.0–36.0)
MCV: 86.2 fl (ref 78.0–100.0)
Platelets: 350 10*3/uL (ref 150.0–400.0)
RBC: 4.39 Mil/uL (ref 3.87–5.11)
RDW: 13.2 % (ref 11.5–15.5)
WBC: 10.2 10*3/uL (ref 4.0–10.5)

## 2020-10-30 LAB — LIPASE: Lipase: 21 U/L (ref 11.0–59.0)

## 2020-10-30 NOTE — Telephone Encounter (Signed)
Patient notified and will come for labs at her next convenience She understands to pick up her instructions and CT contrast from the 3rd floor when she comes for labs She is scheduled for [place] CT 11/05/20 4:00.

## 2020-11-02 NOTE — Telephone Encounter (Signed)
Faxed PA request to [facility] 11/02/2020.

## 2020-11-05 ENCOUNTER — Other Ambulatory Visit: Payer: Self-pay

## 2020-11-05 ENCOUNTER — Ambulatory Visit (INDEPENDENT_AMBULATORY_CARE_PROVIDER_SITE_OTHER)
Admission: RE | Admit: 2020-11-05 | Discharge: 2020-11-05 | Disposition: A | Discharge status: Home / Self Care | Payer: BC Managed Care – PPO | Source: Ambulatory Visit | Attending: Internal Medicine | Admitting: Internal Medicine

## 2020-11-05 DIAGNOSIS — R109 Unspecified abdominal pain: Secondary | ICD-10-CM | POA: Diagnosis not present

## 2020-11-05 DIAGNOSIS — R1084 Generalized abdominal pain: Secondary | ICD-10-CM | POA: Diagnosis not present

## 2020-11-05 MED ORDER — IOHEXOL 350 MG/ML SOLN
65.0000 mL | Freq: Once | INTRAVENOUS | Status: AC | PRN
  Administered 2020-11-05: 65 mL via INTRAVENOUS

## 2020-11-08 ENCOUNTER — Encounter: Payer: Self-pay | Admitting: Physical Medicine & Rehabilitation

## 2020-11-08 DIAGNOSIS — N3946 Mixed incontinence: Secondary | ICD-10-CM | POA: Diagnosis not present

## 2020-11-08 DIAGNOSIS — N3281 Overactive bladder: Secondary | ICD-10-CM | POA: Diagnosis not present

## 2020-11-09 NOTE — Telephone Encounter (Signed)
Please call to schedule [place] knee injections.  Authorized for Synvisc series Bilateral knees. Buy and Bill. Deductible does not apply. PA thru [place]. Authorization # [id number], effective: 11/02/2020- 04/30/2021. Insurance to cover 100% of allowable cost with a $35.00 copay each visit.

## 2020-11-12 ENCOUNTER — Other Ambulatory Visit: Payer: Self-pay

## 2020-11-12 ENCOUNTER — Ambulatory Visit: Payer: BC Managed Care – PPO | Admitting: Physician Assistant

## 2020-11-12 DIAGNOSIS — M170 Bilateral primary osteoarthritis of knee: Secondary | ICD-10-CM | POA: Diagnosis not present

## 2020-11-12 DIAGNOSIS — M1712 Unilateral primary osteoarthritis, left knee: Secondary | ICD-10-CM

## 2020-11-12 DIAGNOSIS — M1711 Unilateral primary osteoarthritis, right knee: Secondary | ICD-10-CM

## 2020-11-12 MED ORDER — HYLAN G-F 20 16 MG/2ML IX SOSY
16.0000 mg | PREFILLED_SYRINGE | INTRA_ARTICULAR | Status: AC | PRN
  Administered 2020-11-12: 16 mg via INTRA_ARTICULAR

## 2020-11-12 MED ORDER — LIDOCAINE HCL 1 % IJ SOLN
1.5000 mL | INTRAMUSCULAR | Status: AC | PRN
  Administered 2020-11-12: 1.5 mL via INTRA_ARTICULAR

## 2020-11-12 NOTE — Progress Notes (Signed)
   Procedure Note  Patient: Tracey Rodriguez             Date of Birth: May 13, 1960           MRN: [id number]             Visit Date: 11/12/2020  Procedures: Visit Diagnoses:  1. Primary osteoarthritis of both knees    Synvisc #1 bilateral knees, B/B Large Joint Inj: bilateral knee on 11/12/2020 9:47 AM Indications: pain Details: 25 G 1.5 in needle, medial approach  Arthrogram: No  Medications (Right): 1.5 mL lidocaine 1 %; 16 mg Hylan 16 MG/2ML Aspirate (Right): 0 mL Medications (Left): 1.5 mL lidocaine 1 %; 16 mg Hylan 16 MG/2ML Aspirate (Left): 0 mL Outcome: tolerated well, no immediate complications Procedure, treatment alternatives, risks and benefits explained, specific risks discussed. Consent was given by the patient. Immediately prior to procedure a time out was called to verify the correct patient, procedure, equipment, support staff and site/side marked as required. Patient was prepped and draped in the usual sterile fashion.    Patient tolerated the procedure well.  Aftercare was discussed.  Hazel Sams, PA-C

## 2020-11-13 DIAGNOSIS — M5030 Other cervical disc degeneration, unspecified cervical region: Secondary | ICD-10-CM | POA: Diagnosis not present

## 2020-11-13 DIAGNOSIS — M5136 Other intervertebral disc degeneration, lumbar region: Secondary | ICD-10-CM | POA: Diagnosis not present

## 2020-11-13 DIAGNOSIS — M533 Sacrococcygeal disorders, not elsewhere classified: Secondary | ICD-10-CM | POA: Diagnosis not present

## 2020-11-13 DIAGNOSIS — M797 Fibromyalgia: Secondary | ICD-10-CM | POA: Diagnosis not present

## 2020-11-15 ENCOUNTER — Telehealth: Payer: Self-pay | Admitting: Interventional Cardiology

## 2020-11-15 ENCOUNTER — Other Ambulatory Visit: Payer: Self-pay

## 2020-11-15 ENCOUNTER — Other Ambulatory Visit: Payer: BC Managed Care – PPO | Admitting: *Deleted

## 2020-11-15 DIAGNOSIS — R635 Abnormal weight gain: Secondary | ICD-10-CM

## 2020-11-15 DIAGNOSIS — R0600 Dyspnea, unspecified: Secondary | ICD-10-CM | POA: Diagnosis not present

## 2020-11-15 DIAGNOSIS — R609 Edema, unspecified: Secondary | ICD-10-CM

## 2020-11-15 DIAGNOSIS — R0609 Other forms of dyspnea: Secondary | ICD-10-CM

## 2020-11-15 NOTE — Telephone Encounter (Signed)
Pt c/o swelling: STAT is pt has developed SOB within 24 hours  If swelling, where is the swelling located? Face, feet and hands   How much weight have you gained and in what time span? 192 December 2021 230 as of Spring   Have you gained 3 pounds in a day or 5 pounds in a week? yes  Do you have a log of your daily weights (if so, list)? Pt says it always the same @230  after fluid pills it drops to 226  Are you currently taking a fluid pill? yes  Are you currently SOB? Only on exertion   Have you traveled recently? no

## 2020-11-15 NOTE — Telephone Encounter (Signed)
Pt calling requesting a refill on furosemide 20 mg tablet. Pt stated that she was told by Dr. Irish Lack to increase her furosemide 20 mg tablets to 2 tablets daily. I do not see where Dr. Irish Lack stated for pt to take 2 tablets daily. Pt would like a call back concerning this matter. Please address

## 2020-11-15 NOTE — Telephone Encounter (Signed)
See other phone note from today.

## 2020-11-15 NOTE — Telephone Encounter (Signed)
I spoke with patient. She reports 30 lb weight gain from December to April.  Weight has stayed around 230 lbs since that time.  Will drop to 224-226 lbs after taking lasix.  She increased lasix to 40 mg daily in August (see 8/23 phone note).  Increased lasix has helped swelling in her face. She continues to have swelling in her legs. Worsens as the day goes on. She is unable to elevate her legs during the day but does elevate them in the evenings. Per my chart message sent on September 6 Dr Irish Lack recommended CMET, CBC, TSH, BNP if not recently checked elsewhere.  Since then patient has had CMET and CBC checked (results in chart).  She is not sure if TSH or BNP checked.   Patient will come in today to have TSH and BNP checked. Patient reports shortness of breath with activity and walking. This has been going on since Spring. Patient had recent echo on 10/17/20.  Results reviewed with patient.  Patient needs lasix refilled.  Will forward to Dr Irish Lack to see what does he recommends patient take.

## 2020-11-15 NOTE — Telephone Encounter (Signed)
Pt c/o medication issue:  1. Name of Medication: furosemide (LASIX) 20 MG tablet  2. How are you currently taking this medication (dosage and times per day)? Take 1 tablet (20 mg total) by mouth daily as needed for fluid or edema.Patient taking differently: Take 40 mg by mouth daily.   3. Are you having a reaction (difficulty breathing--STAT)? No   4. What is your medication issue? Pt was originally taking this medication once a day as needed per PCP orders... Pt was late advised by Dr. Irish Lack to take twice daily which has now caused her to run out of medication before refill date... pt is needing new rx with updated dosage information in order to refill... please advise

## 2020-11-16 ENCOUNTER — Other Ambulatory Visit: Payer: Self-pay

## 2020-11-16 LAB — TSH: TSH: 2.59 u[IU]/mL (ref 0.450–4.500)

## 2020-11-16 LAB — PRO B NATRIURETIC PEPTIDE: NT-Pro BNP: 22 pg/mL (ref 0–287)

## 2020-11-16 MED ORDER — DICYCLOMINE HCL 20 MG PO TABS: 20.0000 mg | ORAL_TABLET | Freq: Three times a day (TID) | ORAL | 2 refills | Status: DC

## 2020-11-16 MED ORDER — FUROSEMIDE 20 MG PO TABS: 40.0000 mg | ORAL_TABLET | Freq: Every day | ORAL | 3 refills | Status: DC | PRN

## 2020-11-16 NOTE — Telephone Encounter (Signed)
Normal BNP and TSH.  I don't think she will benefit from additional Lasix.  Can continue lasix 40 mg daily prn swelling

## 2020-11-16 NOTE — Telephone Encounter (Signed)
Gave recommendation to patient. Verbalized understanding.  Sent in refill requested.

## 2020-11-19 ENCOUNTER — Other Ambulatory Visit: Payer: Self-pay

## 2020-11-19 ENCOUNTER — Ambulatory Visit: Payer: BC Managed Care – PPO | Admitting: Physician Assistant

## 2020-11-19 DIAGNOSIS — M170 Bilateral primary osteoarthritis of knee: Secondary | ICD-10-CM

## 2020-11-19 DIAGNOSIS — M1711 Unilateral primary osteoarthritis, right knee: Secondary | ICD-10-CM

## 2020-11-19 DIAGNOSIS — M1712 Unilateral primary osteoarthritis, left knee: Secondary | ICD-10-CM | POA: Diagnosis not present

## 2020-11-19 MED ORDER — HYLAN G-F 20 16 MG/2ML IX SOSY
16.0000 mg | PREFILLED_SYRINGE | INTRA_ARTICULAR | Status: AC | PRN
  Administered 2020-11-19: 16 mg via INTRA_ARTICULAR

## 2020-11-19 MED ORDER — LIDOCAINE HCL 1 % IJ SOLN
1.5000 mL | INTRAMUSCULAR | Status: AC | PRN
  Administered 2020-11-19: 1.5 mL via INTRA_ARTICULAR

## 2020-11-19 NOTE — Progress Notes (Signed)
   Procedure Note  Patient: Tracey Rodriguez             Date of Birth: 1960/07/23           MRN: [id number]             Visit Date: 11/19/2020  Procedures: Visit Diagnoses:  1. Primary osteoarthritis of both knees    Synvisc #2 bilateral knees, B/B Large Joint Inj: bilateral knee on 11/19/2020 11:55 AM Indications: pain Details: 25 G 1.5 in needle, medial approach  Arthrogram: No  Medications (Right): 1.5 mL lidocaine 1 %; 16 mg Hylan 16 MG/2ML Aspirate (Right): 0 mL Medications (Left): 1.5 mL lidocaine 1 %; 16 mg Hylan 16 MG/2ML Aspirate (Left): 0 mL Outcome: tolerated well, no immediate complications Procedure, treatment alternatives, risks and benefits explained, specific risks discussed. Consent was given by the patient. Immediately prior to procedure a time out was called to verify the correct patient, procedure, equipment, support staff and site/side marked as required. Patient was prepped and draped in the usual sterile fashion.    Patient tolerated the procedure well.  Aftercare was discussed.  Hazel Sams, PA-C

## 2020-11-26 ENCOUNTER — Other Ambulatory Visit: Payer: Self-pay

## 2020-11-26 ENCOUNTER — Ambulatory Visit: Payer: BC Managed Care – PPO | Admitting: Physician Assistant

## 2020-11-26 DIAGNOSIS — M1712 Unilateral primary osteoarthritis, left knee: Secondary | ICD-10-CM

## 2020-11-26 DIAGNOSIS — M170 Bilateral primary osteoarthritis of knee: Secondary | ICD-10-CM

## 2020-11-26 DIAGNOSIS — M1711 Unilateral primary osteoarthritis, right knee: Secondary | ICD-10-CM

## 2020-11-26 MED ORDER — LIDOCAINE HCL 1 % IJ SOLN
1.5000 mL | INTRAMUSCULAR | Status: AC | PRN
  Administered 2020-11-26: 1.5 mL via INTRA_ARTICULAR

## 2020-11-26 MED ORDER — HYLAN G-F 20 16 MG/2ML IX SOSY
16.0000 mg | PREFILLED_SYRINGE | INTRA_ARTICULAR | Status: AC | PRN
  Administered 2020-11-26: 16 mg via INTRA_ARTICULAR

## 2020-11-26 NOTE — Progress Notes (Signed)
   Procedure Note  Patient: Tracey Rodriguez             Date of Birth: 1961/01/24           MRN: [id number]             Visit Date: 11/26/2020  Procedures: Visit Diagnoses:  1. Primary osteoarthritis of both knees     Synvisc #3 bilateral knees, B/B Large Joint Inj: bilateral knee on 11/26/2020 11:41 AM Indications: pain Details: 25 G needle, medial approach  Arthrogram: No  Medications (Right): 1.5 mL lidocaine 1 %; 16 mg Hylan 16 MG/2ML Aspirate (Right): 0 mL Medications (Left): 1.5 mL lidocaine 1 %; 16 mg Hylan 16 MG/2ML Aspirate (Left): 0 mL Outcome: tolerated well, no immediate complications Procedure, treatment alternatives, risks and benefits explained, specific risks discussed. Consent was given by the patient. Immediately prior to procedure a time out was called to verify the correct patient, procedure, equipment, support staff and site/side marked as required. Patient was prepped and draped in the usual sterile fashion.    Patient tolerated the procedure well.  Aftercare was discussed.  Hazel Sams, PA-C

## 2020-11-27 DIAGNOSIS — M533 Sacrococcygeal disorders, not elsewhere classified: Secondary | ICD-10-CM | POA: Diagnosis not present

## 2020-11-27 DIAGNOSIS — M5030 Other cervical disc degeneration, unspecified cervical region: Secondary | ICD-10-CM | POA: Diagnosis not present

## 2020-11-27 DIAGNOSIS — M5136 Other intervertebral disc degeneration, lumbar region: Secondary | ICD-10-CM | POA: Diagnosis not present

## 2020-11-27 DIAGNOSIS — M797 Fibromyalgia: Secondary | ICD-10-CM | POA: Diagnosis not present

## 2020-11-29 DIAGNOSIS — J328 Other chronic sinusitis: Secondary | ICD-10-CM | POA: Diagnosis not present

## 2020-11-29 DIAGNOSIS — K219 Gastro-esophageal reflux disease without esophagitis: Secondary | ICD-10-CM | POA: Diagnosis not present

## 2020-11-29 DIAGNOSIS — R040 Epistaxis: Secondary | ICD-10-CM | POA: Diagnosis not present

## 2020-11-29 DIAGNOSIS — R1110 Vomiting, unspecified: Secondary | ICD-10-CM | POA: Diagnosis not present

## 2020-11-29 DIAGNOSIS — Z87891 Personal history of nicotine dependence: Secondary | ICD-10-CM | POA: Diagnosis not present

## 2020-11-29 DIAGNOSIS — R490 Dysphonia: Secondary | ICD-10-CM | POA: Diagnosis not present

## 2020-11-29 DIAGNOSIS — K2100 Gastro-esophageal reflux disease with esophagitis, without bleeding: Secondary | ICD-10-CM | POA: Diagnosis not present

## 2020-11-29 DIAGNOSIS — M533 Sacrococcygeal disorders, not elsewhere classified: Secondary | ICD-10-CM | POA: Diagnosis not present

## 2020-11-29 DIAGNOSIS — M797 Fibromyalgia: Secondary | ICD-10-CM | POA: Diagnosis not present

## 2020-11-29 DIAGNOSIS — M5030 Other cervical disc degeneration, unspecified cervical region: Secondary | ICD-10-CM | POA: Diagnosis not present

## 2020-11-29 DIAGNOSIS — J310 Chronic rhinitis: Secondary | ICD-10-CM | POA: Diagnosis not present

## 2020-11-29 DIAGNOSIS — M5136 Other intervertebral disc degeneration, lumbar region: Secondary | ICD-10-CM | POA: Diagnosis not present

## 2020-11-29 DIAGNOSIS — R0982 Postnasal drip: Secondary | ICD-10-CM | POA: Diagnosis not present

## 2020-11-29 DIAGNOSIS — R1319 Other dysphagia: Secondary | ICD-10-CM | POA: Diagnosis not present

## 2020-11-29 DIAGNOSIS — J3489 Other specified disorders of nose and nasal sinuses: Secondary | ICD-10-CM | POA: Diagnosis not present

## 2020-12-03 DIAGNOSIS — J383 Other diseases of vocal cords: Secondary | ICD-10-CM | POA: Diagnosis not present

## 2020-12-03 DIAGNOSIS — R1310 Dysphagia, unspecified: Secondary | ICD-10-CM | POA: Diagnosis not present

## 2020-12-03 DIAGNOSIS — J385 Laryngeal spasm: Secondary | ICD-10-CM | POA: Diagnosis not present

## 2020-12-03 DIAGNOSIS — R070 Pain in throat: Secondary | ICD-10-CM | POA: Diagnosis not present

## 2020-12-03 DIAGNOSIS — R499 Unspecified voice and resonance disorder: Secondary | ICD-10-CM | POA: Diagnosis not present

## 2020-12-03 DIAGNOSIS — J3489 Other specified disorders of nose and nasal sinuses: Secondary | ICD-10-CM | POA: Diagnosis not present

## 2020-12-03 DIAGNOSIS — J384 Edema of larynx: Secondary | ICD-10-CM | POA: Diagnosis not present

## 2020-12-04 DIAGNOSIS — M5136 Other intervertebral disc degeneration, lumbar region: Secondary | ICD-10-CM | POA: Diagnosis not present

## 2020-12-04 DIAGNOSIS — M797 Fibromyalgia: Secondary | ICD-10-CM | POA: Diagnosis not present

## 2020-12-04 DIAGNOSIS — M5030 Other cervical disc degeneration, unspecified cervical region: Secondary | ICD-10-CM | POA: Diagnosis not present

## 2020-12-04 DIAGNOSIS — M533 Sacrococcygeal disorders, not elsewhere classified: Secondary | ICD-10-CM | POA: Diagnosis not present

## 2020-12-06 DIAGNOSIS — M533 Sacrococcygeal disorders, not elsewhere classified: Secondary | ICD-10-CM | POA: Diagnosis not present

## 2020-12-06 DIAGNOSIS — M5030 Other cervical disc degeneration, unspecified cervical region: Secondary | ICD-10-CM | POA: Diagnosis not present

## 2020-12-06 DIAGNOSIS — M797 Fibromyalgia: Secondary | ICD-10-CM | POA: Diagnosis not present

## 2020-12-06 DIAGNOSIS — M5136 Other intervertebral disc degeneration, lumbar region: Secondary | ICD-10-CM | POA: Diagnosis not present

## 2020-12-11 DIAGNOSIS — M797 Fibromyalgia: Secondary | ICD-10-CM | POA: Diagnosis not present

## 2020-12-11 DIAGNOSIS — M5030 Other cervical disc degeneration, unspecified cervical region: Secondary | ICD-10-CM | POA: Diagnosis not present

## 2020-12-11 DIAGNOSIS — M5136 Other intervertebral disc degeneration, lumbar region: Secondary | ICD-10-CM | POA: Diagnosis not present

## 2020-12-11 DIAGNOSIS — M533 Sacrococcygeal disorders, not elsewhere classified: Secondary | ICD-10-CM | POA: Diagnosis not present

## 2020-12-15 ENCOUNTER — Other Ambulatory Visit: Payer: Self-pay | Admitting: Internal Medicine

## 2020-12-20 DIAGNOSIS — M5030 Other cervical disc degeneration, unspecified cervical region: Secondary | ICD-10-CM | POA: Diagnosis not present

## 2020-12-20 DIAGNOSIS — M533 Sacrococcygeal disorders, not elsewhere classified: Secondary | ICD-10-CM | POA: Diagnosis not present

## 2020-12-20 DIAGNOSIS — M797 Fibromyalgia: Secondary | ICD-10-CM | POA: Diagnosis not present

## 2020-12-20 DIAGNOSIS — M5136 Other intervertebral disc degeneration, lumbar region: Secondary | ICD-10-CM | POA: Diagnosis not present

## 2020-12-25 DIAGNOSIS — M5136 Other intervertebral disc degeneration, lumbar region: Secondary | ICD-10-CM | POA: Diagnosis not present

## 2020-12-25 DIAGNOSIS — M5030 Other cervical disc degeneration, unspecified cervical region: Secondary | ICD-10-CM | POA: Diagnosis not present

## 2020-12-25 DIAGNOSIS — M797 Fibromyalgia: Secondary | ICD-10-CM | POA: Diagnosis not present

## 2020-12-25 DIAGNOSIS — M533 Sacrococcygeal disorders, not elsewhere classified: Secondary | ICD-10-CM | POA: Diagnosis not present

## 2020-12-27 DIAGNOSIS — M5030 Other cervical disc degeneration, unspecified cervical region: Secondary | ICD-10-CM | POA: Diagnosis not present

## 2020-12-27 DIAGNOSIS — M533 Sacrococcygeal disorders, not elsewhere classified: Secondary | ICD-10-CM | POA: Diagnosis not present

## 2020-12-27 DIAGNOSIS — M797 Fibromyalgia: Secondary | ICD-10-CM | POA: Diagnosis not present

## 2020-12-27 DIAGNOSIS — M5136 Other intervertebral disc degeneration, lumbar region: Secondary | ICD-10-CM | POA: Diagnosis not present

## 2021-01-01 DIAGNOSIS — M533 Sacrococcygeal disorders, not elsewhere classified: Secondary | ICD-10-CM | POA: Diagnosis not present

## 2021-01-01 DIAGNOSIS — M797 Fibromyalgia: Secondary | ICD-10-CM | POA: Diagnosis not present

## 2021-01-01 DIAGNOSIS — M5136 Other intervertebral disc degeneration, lumbar region: Secondary | ICD-10-CM | POA: Diagnosis not present

## 2021-01-01 DIAGNOSIS — M5030 Other cervical disc degeneration, unspecified cervical region: Secondary | ICD-10-CM | POA: Diagnosis not present

## 2021-01-02 DIAGNOSIS — L814 Other melanin hyperpigmentation: Secondary | ICD-10-CM | POA: Diagnosis not present

## 2021-01-02 DIAGNOSIS — L570 Actinic keratosis: Secondary | ICD-10-CM | POA: Diagnosis not present

## 2021-01-02 DIAGNOSIS — D2271 Melanocytic nevi of right lower limb, including hip: Secondary | ICD-10-CM | POA: Diagnosis not present

## 2021-01-02 DIAGNOSIS — Z6837 Body mass index (BMI) 37.0-37.9, adult: Secondary | ICD-10-CM | POA: Diagnosis not present

## 2021-01-02 DIAGNOSIS — L821 Other seborrheic keratosis: Secondary | ICD-10-CM | POA: Diagnosis not present

## 2021-01-02 DIAGNOSIS — E8881 Metabolic syndrome: Secondary | ICD-10-CM | POA: Diagnosis not present

## 2021-01-02 DIAGNOSIS — E669 Obesity, unspecified: Secondary | ICD-10-CM | POA: Diagnosis not present

## 2021-01-02 DIAGNOSIS — K760 Fatty (change of) liver, not elsewhere classified: Secondary | ICD-10-CM | POA: Diagnosis not present

## 2021-01-02 DIAGNOSIS — D225 Melanocytic nevi of trunk: Secondary | ICD-10-CM | POA: Diagnosis not present

## 2021-01-03 DIAGNOSIS — M5030 Other cervical disc degeneration, unspecified cervical region: Secondary | ICD-10-CM | POA: Diagnosis not present

## 2021-01-03 DIAGNOSIS — M797 Fibromyalgia: Secondary | ICD-10-CM | POA: Diagnosis not present

## 2021-01-03 DIAGNOSIS — M5136 Other intervertebral disc degeneration, lumbar region: Secondary | ICD-10-CM | POA: Diagnosis not present

## 2021-01-03 DIAGNOSIS — M533 Sacrococcygeal disorders, not elsewhere classified: Secondary | ICD-10-CM | POA: Diagnosis not present

## 2021-01-08 ENCOUNTER — Encounter
Payer: BC Managed Care – PPO | Attending: Physical Medicine & Rehabilitation | Admitting: Physical Medicine & Rehabilitation

## 2021-01-08 ENCOUNTER — Ambulatory Visit
Admission: RE | Admit: 2021-01-08 | Discharge: 2021-01-08 | Disposition: A | Discharge status: Home / Self Care | Payer: BC Managed Care – PPO | Source: Ambulatory Visit | Attending: Physical Medicine & Rehabilitation | Admitting: Physical Medicine & Rehabilitation

## 2021-01-08 ENCOUNTER — Encounter: Payer: Self-pay | Admitting: Physical Medicine & Rehabilitation

## 2021-01-08 ENCOUNTER — Other Ambulatory Visit: Payer: Self-pay

## 2021-01-08 VITALS — BP 145/86 | HR 90 | Ht 66.0 in | Wt 228.0 lb

## 2021-01-08 DIAGNOSIS — M542 Cervicalgia: Secondary | ICD-10-CM | POA: Diagnosis not present

## 2021-01-08 DIAGNOSIS — M5030 Other cervical disc degeneration, unspecified cervical region: Secondary | ICD-10-CM | POA: Diagnosis not present

## 2021-01-08 DIAGNOSIS — M797 Fibromyalgia: Secondary | ICD-10-CM | POA: Diagnosis not present

## 2021-01-08 DIAGNOSIS — M545 Low back pain, unspecified: Secondary | ICD-10-CM

## 2021-01-08 DIAGNOSIS — M5136 Other intervertebral disc degeneration, lumbar region: Secondary | ICD-10-CM | POA: Diagnosis not present

## 2021-01-08 DIAGNOSIS — M533 Sacrococcygeal disorders, not elsewhere classified: Secondary | ICD-10-CM | POA: Diagnosis not present

## 2021-01-08 MED ORDER — PREGABALIN 25 MG PO CAPS: 25.0000 mg | ORAL_CAPSULE | Freq: Two times a day (BID) | ORAL | 1 refills | Status: DC

## 2021-01-08 NOTE — Patient Instructions (Signed)
Need MRI reports from [facility]

## 2021-01-08 NOTE — Progress Notes (Addendum)
Subjective:    Patient ID: Tracey Rodriguez, female    DOB: 1960/11/29, 60 y.o.   MRN: [id number]  HPI CC:  Low back > neck pain 60 yo morbidly obese female with hx of fibromyalgia as well as L5-S1 anterolisthesis.  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 General activity 10 Relation with others 10 Enjoyment of life 10 What TIME of day is your pain at its worst? daytime Sleep (in general) Fair  Pain is worse with: walking, bending, sitting, standing, and some activites Pain improves with: therapy/exercise Relief from Meds: 0  walk without assistance ability to climb steps?  no do you drive?  yes  retired I need assistance with the following:  household duties  bladder control problems bowel control problems weakness numbness tingling trouble walking spasms  Any changes since last visit?  no  Any changes since last visit?  no    Family History  Problem Relation Age of Onset   Breast cancer Mother 46   Ovarian cancer Mother    Heart disease Father 74   Heart attack Father    Clotting disorder Father    Rheumatologic disease Father    Crohn's disease Father    Heart attack Brother    Cancer Brother    Lung cancer Brother    Colon polyps Brother    Throat cancer Brother    Colon polyps Brother    Stroke Brother    Breast cancer Maternal Aunt 11   Stroke Paternal Aunt    Heart disease Paternal Aunt     Kidney disease Paternal Uncle    Liver cancer Maternal Grandmother    Heart disease Maternal Grandfather    Lung cancer Maternal Grandfather    Irritable bowel syndrome Son    Social History   Socioeconomic History   Marital status: Married    Spouse name: Not on file   Number of children: 2   Years of education: Not on file   Highest education level: Not on file  Occupational History   Not on file  Tobacco Use   Smoking status: Former    Packs/day: 3.00    Years: 22.00    Pack years: 66.00    Types: Cigarettes    Quit date: 02/18/2000    Years since quitting: 20.9   Smokeless tobacco: Never  Vaping Use   Vaping Use: Never used  Substance and Sexual Activity   Alcohol use: Yes    Alcohol/week: 0.0 standard drinks    Comment: RARELY   Drug use: Not Currently   Sexual activity: Yes    Birth control/protection: Post-menopausal  Other Topics Concern   Not on file  Social History Narrative   She is married, has children.  They are grown.  1 son and 1 daughter has at least 1 grandchild   Former smoker no significant alcohol no drug use   Social Determinants  of Health   Financial Resource Strain: Not on file  Food Insecurity: Not on file  Transportation Needs: Not on file  Physical Activity: Not on file  Stress: Not on file  Social Connections: Not on file   Past Surgical History:  Procedure Laterality Date   ABLATION     APPENDECTOMY     BREAST EXCISIONAL BIOPSY Left    BREAST SURGERY     left- lumpectomy   DILATATION & CURRETTAGE/HYSTEROSCOPY WITH RESECTOCOPE N/A 04/05/2012   Procedure: hysteroscopy with endocervical curretting;  Surgeon: Allyn Kenner, DO;  Location: [facility];  Service: Gynecology;  Laterality: N/A;   DILATION AND CURETTAGE OF UTERUS     EYE SURGERY Bilateral 10/2017   cataract extraction    LIVER BIOPSY  09/2018   NOSE SURGERY     VAGINAL HYSTERECTOMY  09/10/2020   Past Medical History:  Diagnosis Date   Arthritis    deg disc disease  -  lower back, neck   Cancer ([place])    left breast surgery-lumpectomy   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 (HCC)    Fibromyalgia    GERD (gastroesophageal reflux disease)    Heart murmur    Hx of adenomatous polyp of colon 02/04/2018   Hyperlipidemia    Hypothyroidism    goiter   NAFLD (nonalcoholic fatty liver disease)    Neuromuscular disorder (HCC)    sciatic nerve   PONV (postoperative nausea and vomiting)    Rectocele 01/27/2018   SVD (spontaneous vaginal delivery)    x 2   Ht 5\' 6"  (1.676 m)   Wt 228 lb (103.4 kg)   BMI 36.80 kg/m   Opioid Risk Score:   Fall Risk Score:  `1  Depression screen PHQ 2/9  Depression screen PHQ 2/9 01/08/2021  Decreased Interest 0  Down, Depressed, Hopeless 0  PHQ - 2 Score 0  Altered sleeping 2  Tired, decreased energy 2  Change in appetite 0  Feeling bad or failure about yourself  0  Trouble concentrating 0  Moving slowly or fidgety/restless 0  Suicidal thoughts 0  PHQ-9 Score 4  Difficult doing work/chores Somewhat difficult     Review of Systems  Constitutional: Negative.   HENT: Negative.    Eyes: Negative.   Respiratory: Negative.    Cardiovascular: Negative.   Gastrointestinal: Negative.   Endocrine: Negative.   Genitourinary:  Positive for urgency.  Musculoskeletal:  Positive for back pain and gait problem.  Skin: Negative.   Allergic/Immunologic: Negative.   Neurological:  Positive for weakness and numbness.  Hematological: Negative.   Psychiatric/Behavioral: Negative.        Objective:   Physical Exam   General: No acute distress Mood and affect are appropriate Heart: Regular rate and rhythm no rubs murmurs or extra sounds Lungs: Clear to auscultation, breathing unlabored, no rales or wheezes Abdomen: Positive bowel sounds, soft nontender to palpation, nondistended Extremities: No clubbing, cyanosis, or edema Skin: No evidence of breakdown, no evidence of rash Neurologic: Cranial nerves II  through XII intact, motor strength is 5/5 in bilateral deltoid, bicep, tricep, grip, hip flexor, knee extensors, ankle dorsiflexor and plantar flexor Sensory exam normal sensation to light touch and proprioception in bilateral upper and lower extremities Cerebellar exam normal finger to nose to finger as well as heel to shin in bilateral upper and lower extremities Musculoskeletal: Full range of motion in all 4 extremities. No joint swelling, there is tenderness palpation bilateral upper trapezius bilateral sternocostal bilateral  elbow as well as bilateral greater trochanter       Assessment & Plan:   Widespread body pain history of fibromyalgia but also has more focal pain in the low back area.  History of spondylolisthesis, no significant focal neurologic signs.  No clear-cut claudication symptoms, will check x-rays of the lumbar spine to see if there has been any progression of the spondylolisthesis at L5-S1 2.  Neck pain, likely with cervical spine degenerative changes as well as some myofascial pain.  We will obtain imaging reports from [facility] May benefit from physical therapy

## 2021-01-15 ENCOUNTER — Encounter: Payer: Self-pay | Admitting: Internal Medicine

## 2021-01-15 ENCOUNTER — Ambulatory Visit (INDEPENDENT_AMBULATORY_CARE_PROVIDER_SITE_OTHER): Payer: BC Managed Care – PPO | Admitting: Internal Medicine

## 2021-01-15 VITALS — BP 140/80 | HR 85 | Ht 66.0 in | Wt 228.0 lb

## 2021-01-15 DIAGNOSIS — R0781 Pleurodynia: Secondary | ICD-10-CM | POA: Diagnosis not present

## 2021-01-15 DIAGNOSIS — R112 Nausea with vomiting, unspecified: Secondary | ICD-10-CM

## 2021-01-15 DIAGNOSIS — R1310 Dysphagia, unspecified: Secondary | ICD-10-CM | POA: Diagnosis not present

## 2021-01-15 DIAGNOSIS — M5136 Other intervertebral disc degeneration, lumbar region: Secondary | ICD-10-CM | POA: Diagnosis not present

## 2021-01-15 DIAGNOSIS — R140 Abdominal distension (gaseous): Secondary | ICD-10-CM

## 2021-01-15 DIAGNOSIS — J0100 Acute maxillary sinusitis, unspecified: Secondary | ICD-10-CM | POA: Diagnosis not present

## 2021-01-15 DIAGNOSIS — M797 Fibromyalgia: Secondary | ICD-10-CM | POA: Diagnosis not present

## 2021-01-15 DIAGNOSIS — M5030 Other cervical disc degeneration, unspecified cervical region: Secondary | ICD-10-CM | POA: Diagnosis not present

## 2021-01-15 DIAGNOSIS — M533 Sacrococcygeal disorders, not elsewhere classified: Secondary | ICD-10-CM | POA: Diagnosis not present

## 2021-01-15 DIAGNOSIS — K760 Fatty (change of) liver, not elsewhere classified: Secondary | ICD-10-CM

## 2021-01-15 NOTE — Patient Instructions (Signed)
You have been scheduled for an endoscopy. Please follow written instructions given to you at your visit today. If you use inhalers (even only as needed), please bring them with you on the day of your procedure.   You will be contacted by [facility] in the next 2 days to arrange a Barium Swallow.  The number on your caller ID will be [phone], please answer when they call.  If you have not heard from them in 2 days please call [phone] to schedule.    I appreciate the opportunity to care for you. Silvano Rusk, MD, [facility]

## 2021-01-15 NOTE — Progress Notes (Signed)
Tracey Rodriguez 60 y.o. [birth year] [id number]  Assessment & Plan:   Encounter Diagnoses  Name Primary?   Dysphagia, unspecified type Yes   Nausea and vomiting, unspecified vomiting type    Abdominal distention    Rib tenderness    NAFLD (nonalcoholic fatty liver disease)    Difficult to sort all this out but I think it upper endoscopy and a barium swallow with tablet will be useful to evaluate and reassess things.  She has had some chronic dysphagia issues that have escaped a clear cause.  I cannot quite put all of this together.  There is a background of fibromyalgia which could be playing a role.  IBS and functional GI disturbance incredibly common in that situation.  I did tell her to be cautious as she moves forward with the liraglutide as that may exacerbate this postprandial abdominal distention and bloating and vomiting.  She definitely needs an approach to try to lose weight, we briefly discussed limiting and trying to getting off processed foods.  She recently went back to drinking 1 Sprite a day and I told her that needs to stop and she should not get any liquid calories.  I.e. no juices sodas or additives that contain sugar and drinks.  The risks and benefits as well as alternatives of endoscopic procedure(s) have been discussed and reviewed. All questions answered. The patient agrees to proceed.   I appreciate the opportunity to care for this patient. CC: Tracey Dials, MD    Subjective:   Chief Complaint: Bloating vomiting dysphagia  HPI Tracey Rodriguez is a 60 year old woman with functional GI disturbance, nonalcoholic fatty liver disease, fibromyalgia, factor V deficiency, and history of adenomatous colon polyp here for a follow-up visit after she was seen in the early summer.  Tracey Rodriguez saw her in June with complaints of postprandial upper abdominal bloating and "hard" abdomen, gagging with mucoid discharge in the throat triggering vomiting about once every  2 weeks which continues, and an EGD was recommended.  She went on to have a hysterectomy for uterovaginal prolapse stage II and the EGD was not done, she had a CT of the abdomen and pelvis because of persistent issues which was unrevealing as below.  She has had some left upper quadrant pain that is now just a tenderness on the rib.  That is better.  She continues to have a sensation that her throat fills up, she points to the suprasternal notch and feels like there is mucus in there.  She will gag and vomit on this about once every 2 weeks.  She has seen Dr. Redmond Baseman of ENT and then went to Dr. Joya Gaskins of the voice center at [facility] and examinations were unrevealing.  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  Dr. Joya Gaskins mention something about a modified barium swallow but that has not been scheduled.  She had 1 of those in 2019 after I had performed an EGD and a tablet had hung up but otherwise it was unrevealing.  She continues to have this episodic dysphagia as above but also will sometimes have it with water or food.  She recently started liraglutide, having previously been on phentermine, in an effort to try to lose weight in conjunction with treatment of nonalcoholic fatty liver disease.  She reports that she recently resumed drinking 1 Sprite a day but had been off soda but does not get other calories from liquids.  She understands the need to reduced  processed foods.   Wt Readings from Last 3 Encounters:  01/15/21 228 lb (103.4 kg)  01/08/21 228 lb (103.4 kg)  10/25/20 231 lb 9.6 oz (105.1 kg)   Recent labs show a normal TSH on September 29 and a normal CMET, CBC and lipase on October 30, 2020.  IMPRESSION: No CT etiology identified in the abdomen or pelvis to explain epigastric pain.   Aortic Atherosclerosis (ICD10-I70.0).     Electronically Signed   By: Yetta Glassman M.D.   On: 11/06/2020 12:42   2019 EGD - Normal esophagus. - Normal stomach. -  Normal examined duodenum. - Dilation performed in the entire esophagus.   Allergies  Allergen Reactions   Capsaicin Anaphylaxis    Pt not sure of name    Etodolac Shortness Of Breath    REACTION: dyspnea/tightness in chest   Amlodipine     LEE   Bee Venom    Crestor [Rosuvastatin] Itching    All statins    Doxycycline Nausea And Vomiting   Pepto-Bismol [Bismuth] Nausea And Vomiting   Pork-Derived Products Nausea And Vomiting   Sulfonamide Derivatives Nausea And Vomiting    REACTION: vomiting   Current Meds  Medication Sig   B-D UF III MINI PEN NEEDLES 31G X 5 MM MISC daily. as directed   docusate sodium (COLACE) 100 MG capsule Take 100 mg by mouth 2 (two) times daily.   Estradiol 10 MCG INST Place vaginally.   fluorouracil (EFUDEX) 5 % cream Apply topically 2 (two) times daily.   furosemide (LASIX) 20 MG tablet Take 2 tablets (40 mg total) by mouth daily as needed for fluid or edema.   GEMTESA 75 MG TABS Take 1 tablet by mouth daily.   hydrocortisone 2.5 % ointment hydrocortisone 2.5 % topical ointment   hydroquinone 4 % cream at bedtime.   hypromellose (GENTEAL) 0.3 % GEL ophthalmic ointment 1 application as needed.   Insulin Pen Needle 31G X 5 MM MISC daily.   levothyroxine (SYNTHROID, LEVOTHROID) 50 MCG tablet Take 50 mcg by mouth daily.   liraglutide (VICTOZA) 18 MG/3ML SOPN Inject into the skin.   Liraglutide -Weight Management (SAXENDA) 18 MG/3ML SOPN Inject 0.6 mg into the skin daily.   MAGNESIUM PO Take by mouth.   methocarbamol (ROBAXIN) 500 MG tablet TAKE 1 TABLET(500 MG) BY MOUTH TWICE DAILY AS NEEDED FOR MUSCLE SPASMS   metoprolol succinate (TOPROL-XL) 50 MG 24 hr tablet Take 1 tablet (50 mg total) by mouth daily. Take with or immediately following a meal. Please make yearly appt with Dr. Irish Lack for August before anymore refills. 1st attempt   Multiple Vitamins-Minerals (MULTIVITAMIN PO) Take by mouth daily.   mupirocin ointment (BACTROBAN) 2 % Place 1  application into the nose daily as needed (blood blisters).   NEXIUM 40 MG capsule TAKE ONE CAPSULE BY MOUTH TWICE DAILY BEFORE MEALS   Polyethyl Glycol-Propyl Glycol (SYSTANE OP) Apply to eye.   pregabalin (LYRICA) 25 MG capsule Take 1 capsule (25 mg total) by mouth 2 (two) times daily.   scopolamine (TRANSDERM-SCOP) 1 MG/3DAYS Transderm-Scop 1.5 mg transdermal patch (1 mg over 3 days)   triamcinolone cream (KENALOG) 0.1 % triamcinolone acetonide 0.1 % topical cream   Past Medical History:  Diagnosis Date   Arthritis    deg disc disease  - lower back, neck   Cancer (HCC)    left breast surgery-lumpectomy   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 (HCC)    Fibromyalgia  GERD (gastroesophageal reflux disease)    Heart murmur    Hx of adenomatous polyp of colon 02/04/2018   Hyperlipidemia    Hypothyroidism    goiter   NAFLD (nonalcoholic fatty liver disease)    Neuromuscular disorder (HCC)    sciatic nerve   PONV (postoperative nausea and vomiting)    Rectocele 01/27/2018   SVD (spontaneous vaginal delivery)    x 2   Past Surgical History:  Procedure Laterality Date   ABLATION     APPENDECTOMY     BREAST EXCISIONAL BIOPSY Left    BREAST SURGERY     left- lumpectomy   DILATATION & CURRETTAGE/HYSTEROSCOPY WITH RESECTOCOPE N/A 04/05/2012   Procedure: hysteroscopy with endocervical curretting;  Surgeon: Allyn Kenner, DO;  Location: [facility];  Service: Gynecology;  Laterality: N/A;   DILATION AND CURETTAGE OF UTERUS     EYE SURGERY Bilateral 10/2017   cataract extraction    LIVER BIOPSY  09/2018   NOSE SURGERY     VAGINAL HYSTERECTOMY  09/10/2020   Social History   Social History Narrative   She is married, has children.  They are grown.  1 son and 1 daughter has at least 1 grandchild   Former smoker no significant alcohol no drug use   family history includes Breast cancer (age of onset: 72) in her maternal aunt; Breast cancer (age of onset: 40) in her mother;  Cancer in her brother; Clotting disorder in her father; Colon polyps in her brother and brother; Crohn's disease in her father; Heart attack in her brother and father; Heart disease in her maternal grandfather and paternal aunt; Heart disease (age of onset: 81) in her father; Irritable bowel syndrome in her son; Kidney disease in her paternal uncle; Liver cancer in her maternal grandmother; Lung cancer in her brother and maternal grandfather; Ovarian cancer in her mother; Rheumatologic disease in her father; Stroke in her brother and paternal aunt; Throat cancer in her brother.   Review of Systems As per HPI  Objective:   Physical Exam BP 140/80   Pulse 85   Ht 5\' 6"  (1.676 m)   Wt 228 lb (103.4 kg)   BMI 36.80 kg/m  Obese NAD Mouth and pharynx are clear Lungs cta Cor NL S1S2 no rmg L ribs inf ant margin tender Abd obese, soft, NT, no bruits, BS + and no mass Alert and oriented x 3

## 2021-01-17 ENCOUNTER — Telehealth: Payer: Self-pay | Admitting: *Deleted

## 2021-01-17 DIAGNOSIS — M4316 Spondylolisthesis, lumbar region: Secondary | ICD-10-CM

## 2021-01-17 NOTE — Telephone Encounter (Addendum)
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  She is asking if she needs to go ahead and have an MRI. If so she would like to do it before end of year due to deductibles.

## 2021-01-21 ENCOUNTER — Ambulatory Visit (HOSPITAL_COMMUNITY)
Admission: RE | Admit: 2021-01-21 | Discharge: 2021-01-21 | Disposition: A | Discharge status: Home / Self Care | Payer: BC Managed Care – PPO | Source: Ambulatory Visit | Attending: Internal Medicine | Admitting: Internal Medicine

## 2021-01-21 ENCOUNTER — Other Ambulatory Visit: Payer: Self-pay | Admitting: Internal Medicine

## 2021-01-21 DIAGNOSIS — N3946 Mixed incontinence: Secondary | ICD-10-CM | POA: Diagnosis not present

## 2021-01-21 DIAGNOSIS — R1310 Dysphagia, unspecified: Secondary | ICD-10-CM

## 2021-01-21 DIAGNOSIS — R350 Frequency of micturition: Secondary | ICD-10-CM | POA: Diagnosis not present

## 2021-01-21 DIAGNOSIS — N3281 Overactive bladder: Secondary | ICD-10-CM | POA: Diagnosis not present

## 2021-01-21 DIAGNOSIS — K449 Diaphragmatic hernia without obstruction or gangrene: Secondary | ICD-10-CM | POA: Diagnosis not present

## 2021-01-22 DIAGNOSIS — M797 Fibromyalgia: Secondary | ICD-10-CM | POA: Diagnosis not present

## 2021-01-22 DIAGNOSIS — M5136 Other intervertebral disc degeneration, lumbar region: Secondary | ICD-10-CM | POA: Diagnosis not present

## 2021-01-22 DIAGNOSIS — M5030 Other cervical disc degeneration, unspecified cervical region: Secondary | ICD-10-CM | POA: Diagnosis not present

## 2021-01-22 DIAGNOSIS — M533 Sacrococcygeal disorders, not elsewhere classified: Secondary | ICD-10-CM | POA: Diagnosis not present

## 2021-01-24 DIAGNOSIS — M797 Fibromyalgia: Secondary | ICD-10-CM | POA: Diagnosis not present

## 2021-01-24 DIAGNOSIS — M5136 Other intervertebral disc degeneration, lumbar region: Secondary | ICD-10-CM | POA: Diagnosis not present

## 2021-01-24 DIAGNOSIS — M533 Sacrococcygeal disorders, not elsewhere classified: Secondary | ICD-10-CM | POA: Diagnosis not present

## 2021-01-24 DIAGNOSIS — M5030 Other cervical disc degeneration, unspecified cervical region: Secondary | ICD-10-CM | POA: Diagnosis not present

## 2021-01-29 ENCOUNTER — Encounter: Payer: Self-pay | Admitting: Internal Medicine

## 2021-02-05 ENCOUNTER — Encounter: Payer: Self-pay | Admitting: Internal Medicine

## 2021-02-05 ENCOUNTER — Other Ambulatory Visit: Payer: Self-pay

## 2021-02-05 ENCOUNTER — Ambulatory Visit (AMBULATORY_SURGERY_CENTER): Payer: BC Managed Care – PPO | Admitting: Internal Medicine

## 2021-02-05 VITALS — BP 108/66 | HR 77 | Temp 98.8°F | Resp 14 | Ht 66.0 in | Wt 228.0 lb

## 2021-02-05 DIAGNOSIS — K2970 Gastritis, unspecified, without bleeding: Secondary | ICD-10-CM

## 2021-02-05 DIAGNOSIS — K2990 Gastroduodenitis, unspecified, without bleeding: Secondary | ICD-10-CM | POA: Diagnosis not present

## 2021-02-05 DIAGNOSIS — K259 Gastric ulcer, unspecified as acute or chronic, without hemorrhage or perforation: Secondary | ICD-10-CM | POA: Diagnosis not present

## 2021-02-05 DIAGNOSIS — R1310 Dysphagia, unspecified: Secondary | ICD-10-CM

## 2021-02-05 DIAGNOSIS — K449 Diaphragmatic hernia without obstruction or gangrene: Secondary | ICD-10-CM | POA: Diagnosis not present

## 2021-02-05 MED ORDER — SODIUM CHLORIDE 0.9 % IV SOLN: 500.0000 mL | Freq: Once | INTRAVENOUS | Status: DC

## 2021-02-05 NOTE — Op Note (Signed)
[place] Patient Name: Tracey Rodriguez Procedure Date: 02/05/2021 10:55 AM MRN: [id number] Endoscopist: Gatha Mayer , MD Age: 60 Referring MD:  Date of Birth: Sep 28, 1960 Gender: Female Account #: [id number] Procedure:                Upper GI endoscopy Indications:              Dysphagia Medicines:                Propofol per Anesthesia, Monitored Anesthesia Care Procedure:                Pre-Anesthesia Assessment:                           - Prior to the procedure, a History and Physical                            was performed, and patient medications and                            allergies were reviewed. The patient's tolerance of                            previous anesthesia was also reviewed. The risks                            and benefits of the procedure and the sedation                            options and risks were discussed with the patient.                            All questions were answered, and informed consent                            was obtained. Prior Anticoagulants: The patient has                            taken no previous anticoagulant or antiplatelet                            agents. ASA Grade Assessment: III - A patient with                            severe systemic disease. After reviewing the risks                            and benefits, the patient was deemed in                            satisfactory condition to undergo the procedure.                           After obtaining informed consent, the endoscope was  passed under direct vision. Throughout the                            procedure, the patient's blood pressure, pulse, and                            oxygen saturations were monitored continuously. The                            GIF [id number] #[id number] was introduced through the                            mouth, and advanced to the second part of duodenum.                            The upper GI  endoscopy was accomplished without                            difficulty. The patient tolerated the procedure                            well. Scope In: Scope Out: Findings:                 No endoscopic abnormality was evident in the                            esophagus to explain the patient's complaint of                            dysphagia.                           A small sliding hiatal hernia was found.                           A large amount of food (residue) was found in the                            entire examined stomach.                           Localized inflammation characterized by erosions                            and erythema was found in the gastric antrum.                            Biopsies were taken with a cold forceps for                            histology. Verification of patient identification                            for the specimen was done. Estimated blood loss was  minimal.                           The examined duodenum was normal.                           The cardia and gastric fundus were otherwise normal                            on retroflexion. Complications:            No immediate complications. Estimated Blood Loss:     Estimated blood loss was minimal. Impression:               - No endoscopic esophageal abnormality to explain                            patient's dysphagia.                           - Small sliding hiatal hernia. As seen on ba swallow                           - A large amount of food (residue) in the stomach.                            I think this is from high-dose semaglutide                           - Gastritis. Biopsied.                           - Normal examined duodenum.                           No cause of dysphagia seen Recommendation:           - Patient has a contact number available for                            emergencies. The signs and symptoms of potential                             delayed complications were discussed with the                            patient. Return to normal activities tomorrow.                            Written discharge instructions were provided to the                            patient.                           - Resume previous diet.                           -  Continue present medications.                           - Await pathology results.                           - Await path                           I think next step is a modified barium swallow to                            evaluate dysphagia - she had an evaluation at                            [facility] but that was for vocal cord dysfunction and                            did not evaluate swallowing. Gatha Mayer, MD 02/05/2021 11:34:34 AM This report has been signed electronically.

## 2021-02-05 NOTE — Progress Notes (Signed)
[name] vital signs. 

## 2021-02-05 NOTE — Progress Notes (Signed)
Called to room to assist during endoscopic procedure.  Patient ID and intended procedure confirmed with present staff. Received instructions for my participation in the procedure from the performing physician.  

## 2021-02-05 NOTE — Progress Notes (Signed)
Report to PACU, RN, vss, BBS= Clear.  

## 2021-02-05 NOTE — Progress Notes (Signed)
Lumpectomy to left breast. Ok to use weight loss injection.

## 2021-02-05 NOTE — Progress Notes (Signed)
History and Physical Interval Note:  02/05/2021 11:02 AM  Tracey Rodriguez  has presented today for endoscopic procedure(s), with the diagnosis of  Encounter Diagnosis  Name Primary?   Dysphagia, unspecified type Yes  .  The various methods of evaluation and treatment have been discussed with the patient and/or family. After consideration of risks, benefits and other options for treatment, the patient has consented to  the endoscopic procedure(s).   The patient's history has been reviewed, patient examined, no change in status, stable for endoscopic procedure(s).  I have reviewed the patient's chart and labs.  Questions were answered to the patient's satisfaction.     Gatha Mayer, MD, [facility]

## 2021-02-05 NOTE — Progress Notes (Signed)
Pt's states no medical or surgical changes since previsit or office visit. 

## 2021-02-05 NOTE — Patient Instructions (Addendum)
I did not see any narrowing of the esophagus.  Hiatal hernia was seen.  There was food in the stomach which I think is due to semaglutide Rx which slows stomach emptying and will cause the sour stomach symptoms you mentioned.  There was some stomach inflammation - I checked with biopsies.  Once I get those results we will regroup. You have seen speech pathology at [facility] but that was for the vocal cords and not the swallowing. I am thinking you need a test called a modified barium swallow to evaluate swallowing.  I appreciate the opportunity to care for you. Gatha Mayer, MD, [facility]  Resume previous diet and medications. Awaiting pathology results. May need barium swallow evaluation for dysphagia.  YOU HAD AN ENDOSCOPIC PROCEDURE TODAY AT [facility]:   Refer to the procedure report that was given to you for any specific questions about what was found during the examination.  If the procedure report does not answer your questions, please call your gastroenterologist to clarify.  If you requested that your care partner not be given the details of your procedure findings, then the procedure report has been included in a sealed envelope for you to review at your convenience later.  YOU SHOULD EXPECT: Some feelings of bloating in the abdomen. Passage of more gas than usual.  Walking can help get rid of the air that was put into your GI tract during the procedure and reduce the bloating. If you had a lower endoscopy (such as a colonoscopy or flexible sigmoidoscopy) you may notice spotting of blood in your stool or on the toilet paper. If you underwent a bowel prep for your procedure, you may not have a normal bowel movement for a few days.  Please Note:  You might notice some irritation and congestion in your nose or some drainage.  This is from the oxygen used during your procedure.  There is no need for concern and it should clear up in a day or so.  SYMPTOMS TO REPORT  IMMEDIATELY:   Following upper endoscopy (EGD)  Vomiting of blood or coffee ground material  New chest pain or pain under the shoulder blades  Painful or persistently difficult swallowing  New shortness of breath  Fever of 100F or higher  Black, tarry-looking stools  For urgent or emergent issues, a gastroenterologist can be reached at any hour by calling [phone]. Do not use MyChart messaging for urgent concerns.    DIET:  We do recommend a small meal at first, but then you may proceed to your regular diet.  Drink plenty of fluids but you should avoid alcoholic beverages for 24 hours.  ACTIVITY:  You should plan to take it easy for the rest of today and you should NOT DRIVE or use heavy machinery until tomorrow (because of the sedation medicines used during the test).    FOLLOW UP: Our staff will call the number listed on your records 48-72 hours following your procedure to check on you and address any questions or concerns that you may have regarding the information given to you following your procedure. If we do not reach you, we will leave a message.  We will attempt to reach you two times.  During this call, we will ask if you have developed any symptoms of COVID 19. If you develop any symptoms (ie: fever, flu-like symptoms, shortness of breath, cough etc.) before then, please call [phone].  If you test positive for Covid 19 in the  2 weeks post procedure, please call and report this information to [place].    If any biopsies were taken you will be contacted by phone or by letter within the next 1-3 weeks.  Please call us at [phone] if you have not heard about the biopsies in 3 weeks.    SIGNATURES/CONFIDENTIALITY: You and/or your care partner have signed paperwork which will be entered into your electronic medical record.  These signatures attest to the fact that that the information above on your After Visit Summary has been reviewed and is understood.  Full  responsibility of the confidentiality of this discharge information lies with you and/or your care-partner.

## 2021-02-06 ENCOUNTER — Ambulatory Visit
Admission: RE | Admit: 2021-02-06 | Discharge: 2021-02-06 | Disposition: A | Discharge status: Home / Self Care | Payer: BC Managed Care – PPO | Source: Ambulatory Visit | Attending: Physical Medicine & Rehabilitation | Admitting: Physical Medicine & Rehabilitation

## 2021-02-06 ENCOUNTER — Other Ambulatory Visit: Payer: Self-pay

## 2021-02-06 DIAGNOSIS — R1310 Dysphagia, unspecified: Secondary | ICD-10-CM

## 2021-02-06 DIAGNOSIS — M48061 Spinal stenosis, lumbar region without neurogenic claudication: Secondary | ICD-10-CM | POA: Diagnosis not present

## 2021-02-06 DIAGNOSIS — M545 Low back pain, unspecified: Secondary | ICD-10-CM | POA: Diagnosis not present

## 2021-02-06 DIAGNOSIS — M4316 Spondylolisthesis, lumbar region: Secondary | ICD-10-CM

## 2021-02-07 ENCOUNTER — Telehealth: Payer: Self-pay | Admitting: *Deleted

## 2021-02-07 DIAGNOSIS — M5030 Other cervical disc degeneration, unspecified cervical region: Secondary | ICD-10-CM | POA: Diagnosis not present

## 2021-02-07 DIAGNOSIS — M797 Fibromyalgia: Secondary | ICD-10-CM | POA: Diagnosis not present

## 2021-02-07 DIAGNOSIS — M533 Sacrococcygeal disorders, not elsewhere classified: Secondary | ICD-10-CM | POA: Diagnosis not present

## 2021-02-07 DIAGNOSIS — M5136 Other intervertebral disc degeneration, lumbar region: Secondary | ICD-10-CM | POA: Diagnosis not present

## 2021-02-07 NOTE — Telephone Encounter (Signed)
°  Follow up Call-  Call back number 02/05/2021  Post procedure Call Back phone  # [phone]  Permission to leave phone message Yes  Some recent data might be hidden     Patient questions:  Do you have a fever, pain , or abdominal swelling? No. Pain Score  0 *  Have you tolerated food without any problems? Yes.    Have you been able to return to your normal activities? Yes.    Do you have any questions about your discharge instructions: Diet   No. Medications  No. Follow up visit  No.  Do you have questions or concerns about your Care? Yes.  - pt c/o hoarseness and mild soreness on left side of throat since EGD. RN recommended to gargle with warm salt water, use lozenges or chloraseptic spray if needed. Instructed to call back if symptoms worsen. Pt denies SOB, cough or fever. Dr. Carlean Purl made aware. Instructed pt that if he had any further recommendations our office would call her back, but if not she should try the suggestions discussed. Pt verbalized understanding.   Actions: * If pain score is 4 or above: Physician/ provider Notified : Silvano Rusk, MD  Have you developed a fever since your procedure? no  2.   Have you had an respiratory symptoms (SOB or cough) since your procedure? no  3.   Have you tested positive for COVID 19 since your procedure no  4.   Have you had any family members/close contacts diagnosed with the COVID 19 since your procedure?  no   If yes to any of these questions please route to Joylene John, RN and Joella Prince, Therapist, sports .

## 2021-02-08 ENCOUNTER — Other Ambulatory Visit: Payer: Self-pay

## 2021-02-08 ENCOUNTER — Telehealth (HOSPITAL_COMMUNITY): Payer: Self-pay

## 2021-02-08 DIAGNOSIS — R1310 Dysphagia, unspecified: Secondary | ICD-10-CM

## 2021-02-08 NOTE — Telephone Encounter (Signed)
Attempted to contact patient to schedule OP MBS - left voicemail. ?

## 2021-02-12 ENCOUNTER — Other Ambulatory Visit: Payer: Self-pay

## 2021-02-12 ENCOUNTER — Other Ambulatory Visit (HOSPITAL_COMMUNITY): Payer: Self-pay

## 2021-02-12 ENCOUNTER — Telehealth: Payer: Self-pay

## 2021-02-12 DIAGNOSIS — R1310 Dysphagia, unspecified: Secondary | ICD-10-CM

## 2021-02-12 NOTE — Telephone Encounter (Signed)
Pt was ordered a Modified Barium Swallow to evaluate dysphagia:  Referral to Speech placed in Epic for Dysphagia and Globus: Left message for pt to call back

## 2021-02-12 NOTE — Telephone Encounter (Signed)
-----   Message from Gatha Mayer, MD sent at 02/05/2021 11:41 AM EST ----- Regarding: speech path eval Please arrange a speech path eval re: dysphagia and globus for this lady

## 2021-02-13 NOTE — Telephone Encounter (Signed)
Pt stated that she was contacted yesterday and made an appointment for the Modified Barium Swallow test: Pt was notified that some one from speech therapy would be reaching out to her as well.  Pt verbalized understanding with all questions answered.

## 2021-02-14 DIAGNOSIS — M5030 Other cervical disc degeneration, unspecified cervical region: Secondary | ICD-10-CM | POA: Diagnosis not present

## 2021-02-14 DIAGNOSIS — M5136 Other intervertebral disc degeneration, lumbar region: Secondary | ICD-10-CM | POA: Diagnosis not present

## 2021-02-14 DIAGNOSIS — M533 Sacrococcygeal disorders, not elsewhere classified: Secondary | ICD-10-CM | POA: Diagnosis not present

## 2021-02-14 DIAGNOSIS — M797 Fibromyalgia: Secondary | ICD-10-CM | POA: Diagnosis not present

## 2021-02-21 DIAGNOSIS — M5136 Other intervertebral disc degeneration, lumbar region: Secondary | ICD-10-CM | POA: Diagnosis not present

## 2021-02-21 DIAGNOSIS — M533 Sacrococcygeal disorders, not elsewhere classified: Secondary | ICD-10-CM | POA: Diagnosis not present

## 2021-02-21 DIAGNOSIS — M5030 Other cervical disc degeneration, unspecified cervical region: Secondary | ICD-10-CM | POA: Diagnosis not present

## 2021-02-21 DIAGNOSIS — M797 Fibromyalgia: Secondary | ICD-10-CM | POA: Diagnosis not present

## 2021-02-25 ENCOUNTER — Encounter (HOSPITAL_COMMUNITY): Payer: BC Managed Care – PPO

## 2021-02-25 ENCOUNTER — Ambulatory Visit (HOSPITAL_COMMUNITY): Payer: BC Managed Care – PPO

## 2021-02-26 ENCOUNTER — Ambulatory Visit (HOSPITAL_COMMUNITY)
Admission: RE | Admit: 2021-02-26 | Discharge: 2021-02-26 | Disposition: A | Discharge status: Home / Self Care | Payer: BC Managed Care – PPO | Source: Ambulatory Visit | Attending: Internal Medicine | Admitting: Internal Medicine

## 2021-02-26 ENCOUNTER — Other Ambulatory Visit: Payer: Self-pay

## 2021-02-26 DIAGNOSIS — M5136 Other intervertebral disc degeneration, lumbar region: Secondary | ICD-10-CM | POA: Diagnosis not present

## 2021-02-26 DIAGNOSIS — R1310 Dysphagia, unspecified: Secondary | ICD-10-CM | POA: Diagnosis not present

## 2021-02-26 DIAGNOSIS — M5030 Other cervical disc degeneration, unspecified cervical region: Secondary | ICD-10-CM | POA: Diagnosis not present

## 2021-02-26 DIAGNOSIS — M533 Sacrococcygeal disorders, not elsewhere classified: Secondary | ICD-10-CM | POA: Diagnosis not present

## 2021-02-26 DIAGNOSIS — M797 Fibromyalgia: Secondary | ICD-10-CM | POA: Diagnosis not present

## 2021-02-28 DIAGNOSIS — M533 Sacrococcygeal disorders, not elsewhere classified: Secondary | ICD-10-CM | POA: Diagnosis not present

## 2021-02-28 DIAGNOSIS — M5136 Other intervertebral disc degeneration, lumbar region: Secondary | ICD-10-CM | POA: Diagnosis not present

## 2021-02-28 DIAGNOSIS — M5030 Other cervical disc degeneration, unspecified cervical region: Secondary | ICD-10-CM | POA: Diagnosis not present

## 2021-02-28 DIAGNOSIS — M797 Fibromyalgia: Secondary | ICD-10-CM | POA: Diagnosis not present

## 2021-03-05 DIAGNOSIS — K760 Fatty (change of) liver, not elsewhere classified: Secondary | ICD-10-CM | POA: Diagnosis not present

## 2021-03-05 DIAGNOSIS — Z6834 Body mass index (BMI) 34.0-34.9, adult: Secondary | ICD-10-CM | POA: Diagnosis not present

## 2021-03-05 DIAGNOSIS — E8881 Metabolic syndrome: Secondary | ICD-10-CM | POA: Diagnosis not present

## 2021-03-05 DIAGNOSIS — E6609 Other obesity due to excess calories: Secondary | ICD-10-CM | POA: Diagnosis not present

## 2021-03-07 ENCOUNTER — Encounter: Payer: Self-pay | Admitting: Physical Medicine & Rehabilitation

## 2021-03-07 ENCOUNTER — Encounter
Payer: BC Managed Care – PPO | Attending: Physical Medicine & Rehabilitation | Admitting: Physical Medicine & Rehabilitation

## 2021-03-07 ENCOUNTER — Other Ambulatory Visit: Payer: Self-pay

## 2021-03-07 VITALS — BP 145/85 | HR 74 | Temp 97.6°F | Ht 66.0 in | Wt 209.0 lb

## 2021-03-07 DIAGNOSIS — M4316 Spondylolisthesis, lumbar region: Secondary | ICD-10-CM | POA: Diagnosis not present

## 2021-03-07 DIAGNOSIS — M533 Sacrococcygeal disorders, not elsewhere classified: Secondary | ICD-10-CM | POA: Insufficient documentation

## 2021-03-07 DIAGNOSIS — M797 Fibromyalgia: Secondary | ICD-10-CM | POA: Diagnosis not present

## 2021-03-07 DIAGNOSIS — M5136 Other intervertebral disc degeneration, lumbar region: Secondary | ICD-10-CM | POA: Diagnosis not present

## 2021-03-07 DIAGNOSIS — M5030 Other cervical disc degeneration, unspecified cervical region: Secondary | ICD-10-CM | POA: Diagnosis not present

## 2021-03-07 DIAGNOSIS — M542 Cervicalgia: Secondary | ICD-10-CM | POA: Insufficient documentation

## 2021-03-07 MED ORDER — PREGABALIN 25 MG PO CAPS: 25.0000 mg | ORAL_CAPSULE | Freq: Every day | ORAL | 5 refills | Status: DC

## 2021-03-07 NOTE — Progress Notes (Signed)
°  [facility]   Name: Tracey Rodriguez DOB:08-16-1960 MRN: [id number]  Date:03/07/2021  Physician: Alysia Penna, MD    Nurse/CMA: Salvadore Dom MA  Allergies:  Allergies  Allergen Reactions   Amlodipine Itching    LEE LEE LEE   Capsaicin Anaphylaxis    Pt not sure of name    Etodolac Shortness Of Breath    REACTION: dyspnea/tightness in chest Other reaction(s): shortness of breath   Solifenacin Succinate Rash   Bee Venom    Doxycycline Nausea And Vomiting   Pepto-Bismol [Bismuth] Nausea And Vomiting   Pork-Derived Products Nausea And Vomiting   Rosuvastatin Itching    All statins  Other reaction(s): itching   Solifenacin     Other reaction(s): rash   Sulfa Antibiotics     Other reaction(s): Unknown   Sulfonamide Derivatives Nausea And Vomiting    REACTION: vomiting    Consent Signed: Yes.    Is patient diabetic? No.  CBG today? N/A  Pregnant: No.     Anticoagulants: no Anti-inflammatory:  NO Antibiotics: no  Procedure:Right Sacroiliac Steroid Injection  Position: Prone Start Time: 3:36 pm  End Time: 3:42 pm  Fluoro Time: 12  RN/CMA Moses Odoherty Sawatzky MA Kyen Taite MA    Time 2:36 PM 3:46 PM    BP 145/85 129/75    Pulse 74 74    Respirations 16 16    O2 Sat 99 98    S/S 6 6    Pain Level 5/10 3/10     D/C home with SELF, patient A & O X 3, D/C instructions reviewed, and sits independently.

## 2021-03-07 NOTE — Patient Instructions (Signed)
Sacroiliac injection was performed today. A combination of numbing medicine (lidocaine) plus a cortisone medicine (betamethasone) was injected. The injection was done under x-ray guidance. This procedure has been performed to help reduce low back and buttocks pain as well as potentially hip pain. The duration of this injection is variable lasting from hours to  Months. It may repeated if needed. 

## 2021-03-07 NOTE — Progress Notes (Signed)
Established Patient Office Visit  Subjective:  Patient ID: Tracey Rodriguez, female    DOB: [birth year]  Age: 61 y.o. MRN: [id number]  CC:  Chief Complaint  Patient presents with   Procedure    Right Sacroiliac Steroid Injection     HPI TATYM SCHERMER presents for review of cervical and lumbar MRI THese were order for complaints of chronic neck and chronic low back pain  We reviewed reports as well as images .  In Lumbar spine there is some progression of L5-S1 anterolisthesis since 2009 however still is Grade 1 Facet joints have mild arthropathy, no sig lumbar stenosis  C spine has some mild Right C5-6 and C6-7 stenosis, no evidence of cervical myelopathy   Past Medical History:  Diagnosis Date   Arthritis    deg disc disease  - lower back, neck   Cancer ([place])    left breast surgery-lumpectomy   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 (HCC)    Fibromyalgia    GERD (gastroesophageal reflux disease)    Heart murmur    Hx of adenomatous polyp of colon 02/04/2018   Hyperlipidemia    Hypothyroidism    goiter   NAFLD (nonalcoholic fatty liver disease)    Neuromuscular disorder (HCC)    sciatic nerve   PONV (postoperative nausea and vomiting)    Rectocele 01/27/2018   SVD (spontaneous vaginal delivery)    x 2    Past Surgical History:  Procedure Laterality Date   ABLATION     APPENDECTOMY     BREAST EXCISIONAL BIOPSY Left    BREAST SURGERY     left- lumpectomy   DILATATION & CURRETTAGE/HYSTEROSCOPY WITH RESECTOCOPE N/A 04/05/2012   Procedure: hysteroscopy with endocervical curretting;  Surgeon: Allyn Kenner, DO;  Location: [facility];  Service: Gynecology;  Laterality: N/A;   DILATION AND CURETTAGE OF UTERUS     EYE SURGERY Bilateral 10/2017   cataract extraction    LIVER BIOPSY  09/2018   NOSE SURGERY     VAGINAL HYSTERECTOMY  09/10/2020    Family History  Problem Relation Age of Onset   Breast cancer Mother 72   Ovarian cancer Mother    Heart  disease Father 59   Heart attack Father    Clotting disorder Father    Rheumatologic disease Father    Crohn's disease Father    Heart attack Brother    Cancer Brother    Lung cancer Brother    Colon polyps Brother    Throat cancer Brother    Colon polyps Brother    Stroke Brother    Breast cancer Maternal Aunt 60   Stroke Paternal Aunt    Heart disease Paternal Aunt    Kidney disease Paternal Uncle    Liver cancer Maternal Grandmother    Heart disease Maternal Grandfather    Lung cancer Maternal Grandfather    Irritable bowel syndrome Son     Social History   Socioeconomic History   Marital status: Married    Spouse name: Not on file   Number of children: 2   Years of education: Not on file   Highest education level: Not on file  Occupational History   Not on file  Tobacco Use   Smoking status: Former    Packs/day: 3.00    Years: 22.00    Pack years: 66.00    Types: Cigarettes    Quit date: 02/18/2000    Years since quitting: 21.0   Smokeless  tobacco: Never  Vaping Use   Vaping Use: Never used  Substance and Sexual Activity   Alcohol use: Yes    Alcohol/week: 0.0 standard drinks    Comment: RARELY   Drug use: Not Currently   Sexual activity: Yes    Birth control/protection: Post-menopausal  Other Topics Concern   Not on file  Social History Narrative   She is married, has children.  They are grown.  1 son and 1 daughter has at least 1 grandchild   Former smoker no significant alcohol no drug use   Social Determinants of Radio broadcast assistant Strain: Not on file  Food Insecurity: Not on file  Transportation Needs: Not on file  Physical Activity: Not on file  Stress: Not on file  Social Connections: Not on file  Intimate Partner Violence: Not on file    Outpatient Medications Prior to Visit  Medication Sig Dispense Refill   B-D UF III MINI PEN NEEDLES 31G X 5 MM MISC daily. as directed     docusate sodium (COLACE) 100 MG capsule Take 100 mg by  mouth 2 (two) times daily.     Estradiol 10 MCG INST Place vaginally.     fluorouracil (EFUDEX) 5 % cream Apply topically 2 (two) times daily.     furosemide (LASIX) 20 MG tablet Take 2 tablets (40 mg total) by mouth daily as needed for fluid or edema. 180 tablet 3   GEMTESA 75 MG TABS Take 1 tablet by mouth daily.     hydrocortisone 2.5 % ointment hydrocortisone 2.5 % topical ointment     hydroquinone 4 % cream at bedtime.     hypromellose (GENTEAL) 0.3 % GEL ophthalmic ointment 1 application as needed.     Insulin Pen Needle 31G X 5 MM MISC daily.     levothyroxine (SYNTHROID, LEVOTHROID) 50 MCG tablet Take 50 mcg by mouth daily.     Liraglutide -Weight Management (SAXENDA) 18 MG/3ML SOPN Inject 0.6 mg into the skin daily.     methocarbamol (ROBAXIN) 500 MG tablet TAKE 1 TABLET(500 MG) BY MOUTH TWICE DAILY AS NEEDED FOR MUSCLE SPASMS 30 tablet 0   metoprolol succinate (TOPROL-XL) 50 MG 24 hr tablet Take 1 tablet (50 mg total) by mouth daily. Take with or immediately following a meal. Please make yearly appt with Dr. Irish Lack for August before anymore refills. 1st attempt 90 tablet 0   Multiple Vitamins-Minerals (MULTIVITAMIN PO) Take by mouth daily.     mupirocin ointment (BACTROBAN) 2 % Place 1 application into the nose daily as needed (blood blisters).     NEXIUM 40 MG capsule TAKE ONE CAPSULE BY MOUTH TWICE DAILY BEFORE MEALS 180 capsule 3   Polyethyl Glycol-Propyl Glycol (SYSTANE OP) Apply to eye.     pregabalin (LYRICA) 25 MG capsule 1 capsule     scopolamine (TRANSDERM-SCOP) 1 MG/3DAYS Transderm-Scop 1.5 mg transdermal patch (1 mg over 3 days)     triamcinolone cream (KENALOG) 0.1 % triamcinolone acetonide 0.1 % topical cream     MAGNESIUM PO Take by mouth.     metFORMIN (GLUCOPHAGE-XR) 500 MG 24 hr tablet Take by mouth.     predniSONE (DELTASONE) 20 MG tablet 2 tablets with food or milk     liraglutide (VICTOZA) 18 MG/3ML SOPN Inject into the skin. (Patient not taking: Reported on  03/07/2021)     Liraglutide -Weight Management (SAXENDA) 18 MG/3ML SOPN See admin instructions.     pregabalin (LYRICA) 25 MG capsule Take 1 capsule (25  mg total) by mouth 2 (two) times daily. 60 capsule 1   No facility-administered medications prior to visit.    Allergies  Allergen Reactions   Amlodipine Itching    LEE LEE LEE   Capsaicin Anaphylaxis    Pt not sure of name    Etodolac Shortness Of Breath    REACTION: dyspnea/tightness in chest Other reaction(s): shortness of breath   Solifenacin Succinate Rash   Bee Venom    Doxycycline Nausea And Vomiting   Pepto-Bismol [Bismuth] Nausea And Vomiting   Pork-Derived Products Nausea And Vomiting   Rosuvastatin Itching    All statins  Other reaction(s): itching   Solifenacin     Other reaction(s): rash   Sulfa Antibiotics     Other reaction(s): Unknown   Sulfonamide Derivatives Nausea And Vomiting    REACTION: vomiting    ROS Review of Systems    Objective:    Physical Exam Vitals and nursing note reviewed.  Constitutional:      Appearance: She is obese.  HENT:     Head: Normocephalic and atraumatic.  Musculoskeletal:     Comments:  Sacral thrust (prone) :+ R  Distraction (supine): Neg Thigh thrust test: + R  Skin:    General: Skin is warm and dry.  Neurological:     Mental Status: She is alert and oriented to person, place, and time.  Psychiatric:        Mood and Affect: Mood normal.        Behavior: Behavior normal.    BP (!) 145/85    Pulse 74    Temp 97.6 F (36.4 C)    Ht 5\' 6"  (1.676 m)    Wt 209 lb (94.8 kg)    SpO2 99%    BMI 33.73 kg/m  Wt Readings from Last 3 Encounters:  03/07/21 209 lb (94.8 kg)  02/05/21 228 lb (103.4 kg)  01/15/21 228 lb (103.4 kg)     Health Maintenance Due  Topic Date Due   URINE MICROALBUMIN  Never done   HIV Screening  Never done   Hepatitis C Screening  Never done   Zoster Vaccines- Shingrix (1 of 2) Never done   MAMMOGRAM  08/02/2011   PAP SMEAR-Modifier   05/23/2017   COVID-19 Vaccine (3 - Pfizer risk series) 06/15/2019   INFLUENZA VACCINE  09/17/2020    There are no preventive care reminders to display for this patient.  Lab Results  Component Value Date   TSH 2.590 11/15/2020   Lab Results  Component Value Date   WBC 10.2 10/30/2020   HGB 12.8 10/30/2020   HCT 37.8 10/30/2020   MCV 86.2 10/30/2020   PLT 350.0 10/30/2020   Lab Results  Component Value Date   NA 139 10/30/2020   K 4.2 10/30/2020   CO2 28 10/30/2020   GLUCOSE 96 10/30/2020   BUN 18 10/30/2020   CREATININE 0.80 10/30/2020   BILITOT 0.3 10/30/2020   ALKPHOS 70 10/30/2020   AST 20 10/30/2020   ALT 26 10/30/2020   PROT 7.3 10/30/2020   ALBUMIN 4.0 10/30/2020   CALCIUM 9.5 10/30/2020   GFR 80.18 10/30/2020   No results found for: CHOL No results found for: HDL No results found for: LDLCALC No results found for: TRIG No results found for: CHOLHDL No results found for: HGBA1C    Assessment & Plan:   Lumbar pain multifactorial , Lumbar DDD L5-S1 with Gr 1 spondy, no sig nerve root compression Cervical pain with cervical DDD,  no myelopathy sx Right buttocks pain likely SI jt will rec injection  Problem List Items Addressed This Visit   None   No orders of the defined types were placed in this encounter.   Follow-up: No follow-ups on file.  Will see pt in 2 mo post injection , reassess L side , no PE sx of SI issues today  Charlett Blake, MD

## 2021-03-07 NOTE — Progress Notes (Signed)

## 2021-03-21 DIAGNOSIS — M533 Sacrococcygeal disorders, not elsewhere classified: Secondary | ICD-10-CM | POA: Diagnosis not present

## 2021-03-21 DIAGNOSIS — M5030 Other cervical disc degeneration, unspecified cervical region: Secondary | ICD-10-CM | POA: Diagnosis not present

## 2021-03-21 DIAGNOSIS — M797 Fibromyalgia: Secondary | ICD-10-CM | POA: Diagnosis not present

## 2021-03-21 DIAGNOSIS — M5136 Other intervertebral disc degeneration, lumbar region: Secondary | ICD-10-CM | POA: Diagnosis not present

## 2021-03-27 ENCOUNTER — Telehealth: Payer: Self-pay | Admitting: Internal Medicine

## 2021-03-27 NOTE — Telephone Encounter (Signed)
Inbound call from patient, states that as of January 2023 that her insurance will not cover Nexium and it will be anywhere from 1,400 to 1,700 dollars out of pocket. She stated that insurance said she could take [place]. Patient wants to make sure that's okay with Dr. Carlean Purl. Please advise.

## 2021-03-28 NOTE — Telephone Encounter (Signed)
Please Rx Dexilant 60 mg po qd 30 mins before breakfast (this is a qd drug that works like a bid drug)  # 90 3 RF

## 2021-03-28 NOTE — Telephone Encounter (Signed)
Pt stated that as of January 2023 that her insurance will not cover Nexium and it will be anywhere from 1,400 to 1,700 dollars out of pocket. She stated that insurance said she could take [place]. Pt stated that the Generic Nexium does not work for her:  Please Advise  Pt was notified that Dr. Carlean Purl is out of the office this week so it may be next week before he responds.  Pt verbalized understanding with all questions answered.

## 2021-03-29 ENCOUNTER — Other Ambulatory Visit: Payer: Self-pay

## 2021-03-29 DIAGNOSIS — K219 Gastro-esophageal reflux disease without esophagitis: Secondary | ICD-10-CM

## 2021-03-29 MED ORDER — DEXLANSOPRAZOLE 60 MG PO CPDR: 60.0000 mg | DELAYED_RELEASE_CAPSULE | Freq: Every day | ORAL | 3 refills | Status: DC

## 2021-03-29 NOTE — Telephone Encounter (Signed)
Pt made aware of Dr. Carlean Purl recommendations: Prescription sent to pharmacy: pt made aware Pt verbalized understanding with all questions answered

## 2021-04-04 DIAGNOSIS — M5136 Other intervertebral disc degeneration, lumbar region: Secondary | ICD-10-CM | POA: Diagnosis not present

## 2021-04-04 DIAGNOSIS — M533 Sacrococcygeal disorders, not elsewhere classified: Secondary | ICD-10-CM | POA: Diagnosis not present

## 2021-04-04 DIAGNOSIS — M797 Fibromyalgia: Secondary | ICD-10-CM | POA: Diagnosis not present

## 2021-04-04 DIAGNOSIS — M5030 Other cervical disc degeneration, unspecified cervical region: Secondary | ICD-10-CM | POA: Diagnosis not present

## 2021-04-08 DIAGNOSIS — M5136 Other intervertebral disc degeneration, lumbar region: Secondary | ICD-10-CM | POA: Diagnosis not present

## 2021-04-08 DIAGNOSIS — M533 Sacrococcygeal disorders, not elsewhere classified: Secondary | ICD-10-CM | POA: Diagnosis not present

## 2021-04-08 DIAGNOSIS — M797 Fibromyalgia: Secondary | ICD-10-CM | POA: Diagnosis not present

## 2021-04-08 DIAGNOSIS — M5030 Other cervical disc degeneration, unspecified cervical region: Secondary | ICD-10-CM | POA: Diagnosis not present

## 2021-04-11 NOTE — Progress Notes (Signed)
Office Visit Note  Patient: Tracey Rodriguez             Date of Birth: [birth year]           MRN: [id number]             PCP: Aura Dials, MD Referring: Aura Dials, MD Visit Date: 04/25/2021 Occupation: @GUAROCC @  Subjective:  Pain in multiple joints  History of Present Illness: Tracey Rodriguez is a 61 y.o. female history of fibromyalgia, osteoarthritis and degenerative disc disease.  She states she is having flare of fibromyalgia with increased pain and discomfort all over.  She also gives history of hyperalgesia.  She continues to have pain and discomfort in her entire spine especially in her SI joints and bilateral trochanteric bursa.  She was seen at the pain management and had right SI joint injection which was helpful.  She is planning to get left SI joint injection.  She has been having discomfort in her bilateral trochanteric area.  She continues to have pain in her bilateral knee joints.  She had good response to Visco supplement injections.  She was placed on Lyrica 25 mg at bedtime for pain and insomnia which has been helpful.  She uses muscle relaxers on as needed basis.  Activities of Daily Living:  Patient reports morning stiffness for all day. Patient Reports nocturnal pain.  Difficulty dressing/grooming: Reports Difficulty climbing stairs: Reports Difficulty getting out of chair: Reports Difficulty using hands for taps, buttons, cutlery, and/or writing: Reports  Review of Systems  Constitutional:  Positive for fatigue.  HENT:  Negative for mouth sores, mouth dryness and nose dryness.   Eyes:  Positive for dryness. Negative for pain and itching.  Respiratory:  Negative for shortness of breath and difficulty breathing.   Cardiovascular:  Negative for chest pain and palpitations.  Gastrointestinal:  Negative for blood in stool, constipation and diarrhea.  Endocrine: Negative for increased urination.  Genitourinary:  Negative for difficulty urinating.   Musculoskeletal:  Positive for joint pain, joint pain, myalgias, morning stiffness, muscle tenderness and myalgias. Negative for joint swelling.  Skin:  Positive for color change. Negative for rash and redness.  Allergic/Immunologic: Positive for susceptible to infections.  Neurological:  Positive for numbness. Negative for dizziness, headaches, memory loss and weakness.  Hematological:  Positive for bruising/bleeding tendency.  Psychiatric/Behavioral:  Negative for confusion.    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   Vocal cord dysfunction 05/21/2020   Chronic cough 04/19/2020   Sensorineural hearing loss (SNHL), bilateral 04/04/2020   Osteopenia 12/01/2019   NAFLD (nonalcoholic fatty liver disease) 09/29/2019   Obesity (BMI 30.0-34.9) 09/29/2019   Hyperlipidemia 09/09/2018   Prediabetes 09/09/2018   Hx of adenomatous polyp of colon 02/04/2018   Rectocele 01/27/2018   Change in bowel habits 01/05/2018   Hemorrhoids 01/05/2018   Hoarseness 07/30/2017   Gastroesophageal reflux disease 07/30/2017   Pure hypercholesterolemia 06/03/2017   History of breast cancer 09/11/2016   Other fatigue 03/15/2016   Primary insomnia 03/15/2016   Primary osteoarthritis of both hands 03/15/2016   DDD cervical spine 03/15/2016   Osteoarthritis of lumbar spine 03/15/2016   History of bilateral carpal tunnel release 03/15/2016   Unspecified hypothyroidism 03/30/2013   Fibromyalgia 03/30/2013   Breast cancer ([place]) 03/30/2013   Factor V deficiency ([place]) 03/30/2013   Dyspnea 02/02/2013   Restrictive lung disease 02/02/2013    Past Medical History:  Diagnosis Date   Arthritis    deg disc disease  - lower back, neck   Cancer (HCC)    left breast surgery-lumpectomy   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 (HCC)    Fibromyalgia    GERD (gastroesophageal reflux disease)    Heart murmur    Hx  of adenomatous polyp of colon 02/04/2018   Hyperlipidemia    Hypothyroidism    goiter   NAFLD (nonalcoholic fatty liver disease)    Neuromuscular disorder (HCC)    sciatic nerve   PONV (postoperative nausea and vomiting)    Rectocele 01/27/2018   SVD (spontaneous vaginal delivery)    x 2    Family History  Problem Relation Age of Onset   Breast cancer Mother 46   Ovarian cancer Mother    Heart disease Father 77   Heart attack Father    Clotting disorder Father    Rheumatologic disease Father    Crohn's disease Father    Heart attack Brother    Cancer Brother    Lung cancer Brother    Colon polyps Brother    Throat cancer Brother    Colon polyps Brother    Stroke Brother    Breast cancer Maternal Aunt 60   Stroke Paternal Aunt    Heart disease Paternal Aunt    Kidney disease Paternal Uncle    Liver cancer Maternal Grandmother    Heart disease Maternal Grandfather    Lung cancer Maternal Grandfather    Irritable bowel syndrome Son    Past Surgical History:  Procedure Laterality Date   ABLATION     APPENDECTOMY     BREAST EXCISIONAL BIOPSY Left    BREAST SURGERY     left- lumpectomy   DILATATION & CURRETTAGE/HYSTEROSCOPY WITH RESECTOCOPE N/A 04/05/2012   Procedure: hysteroscopy with endocervical curretting;  Surgeon: Allyn Kenner, DO;  Location: [facility];  Service: Gynecology;  Laterality: N/A;   DILATION AND CURETTAGE OF UTERUS     EYE SURGERY Bilateral 10/2017   cataract extraction    LIVER BIOPSY  09/2018   NOSE SURGERY     VAGINAL HYSTERECTOMY  09/10/2020   Social History   Social History Narrative   She is married, has children.  They are grown.  1 son and 1 daughter has at least 1 grandchild   Former smoker no significant alcohol no drug use   Immunization History  Administered Date(s) Administered   Hepatitis A, Adult 04/20/2014, 10/25/2014   Influenza Split 11/14/2008, 03/04/2011, 03/16/2012, 11/17/2012, 12/10/2017   Influenza,inj,Quad PF,6+ Mos  12/19/2016, 01/05/2018, 11/19/2018, 02/01/2020   Influenza,inj,quad, With Preservative 03/07/2014   Influenza-Unspecified 01/26/2014, 11/24/2016, 01/23/2019   PFIZER(Purple Top)SARS-COV-2 Vaccination 04/27/2019, 05/18/2019   Pneumococcal Conjugate-13 03/07/2014   Pneumococcal Polysaccharide-23 02/01/2020   Td 04/28/2005   Tdap 10/10/2014     Objective: Vital Signs: BP (!) 145/75 (BP Location: Left Arm, Patient Position: Sitting, Cuff Size: Normal)    Pulse 83    Ht 5\' 6"  (1.676 m)    Wt 207 lb (93.9 kg)    BMI 33.41 kg/m    Physical Exam Vitals and nursing note reviewed.  Constitutional:      Appearance: She is well-developed.  HENT:     Head: Normocephalic and atraumatic.  Eyes:     Conjunctiva/sclera: Conjunctivae normal.  Cardiovascular:     Rate and Rhythm: Normal rate and regular rhythm.     Heart sounds: Normal heart sounds.  Pulmonary:     Effort:  Pulmonary effort is normal.     Breath sounds: Normal breath sounds.  Abdominal:     General: Bowel sounds are normal.     Palpations: Abdomen is soft.  Musculoskeletal:     Cervical back: Normal range of motion.  Lymphadenopathy:     Cervical: No cervical adenopathy.  Skin:    General: Skin is warm and dry.     Capillary Refill: Capillary refill takes less than 2 seconds.  Neurological:     Mental Status: She is alert and oriented to person, place, and time.  Psychiatric:        Behavior: Behavior normal.     Musculoskeletal Exam: He is she had limited range of motion of the cervical spine.  She had limited range of motion of her lumbar spine.  Shoulder joints, elbow joints, wrist joints, MCPs PIPs and DIPs and good range of motion.  She had bilateral PIP and DIP thickening.  She had tenderness on palpation of bilateral SI joints.  She had tenderness over bilateral trochanteric bursa.  Hip joints, knee joints, ankles, MTPs and PIPs with good range of motion with no synovitis.  CDAI Exam: CDAI Score: -- Patient Global:  --; Provider Global: -- Swollen: --; Tender: -- Joint Exam 04/25/2021   No joint exam has been documented for this visit   There is currently no information documented on the homunculus. Go to the Rheumatology activity and complete the homunculus joint exam.  Investigation: No additional findings.  Imaging: No results found.  Recent Labs: Lab Results  Component Value Date   WBC 10.2 10/30/2020   HGB 12.8 10/30/2020   PLT 350.0 10/30/2020   NA 139 10/30/2020   K 4.2 10/30/2020   CL 103 10/30/2020   CO2 28 10/30/2020   GLUCOSE 96 10/30/2020   BUN 18 10/30/2020   CREATININE 0.80 10/30/2020   BILITOT 0.3 10/30/2020   ALKPHOS 70 10/30/2020   AST 20 10/30/2020   ALT 26 10/30/2020   PROT 7.3 10/30/2020   ALBUMIN 4.0 10/30/2020   CALCIUM 9.5 10/30/2020   GFRAA 83 09/08/2018    Speciality Comments: No specialty comments available.  Procedures:  No procedures performed Allergies: Amlodipine, Capsaicin, Etodolac, Solifenacin succinate, Bee venom, Pepto-bismol [bismuth], Pork-derived products, Rosuvastatin, Solifenacin, Sulfa antibiotics, and Sulfonamide derivatives   Assessment / Plan:     Visit Diagnoses: Fibromyalgia-she is having a flare with generalized pain and discomfort.  She had multiple tender points.  Need for regular exercise, water aerobics and swimming was emphasized.  Other fatigue-related to fibromyalgia.  Primary insomnia-related to fibromyalgia.  Primary osteoarthritis of both knees - Visco September/October 2022.  She had good response to Visco supplement injections.  Chronic SI joint pain-she has been having pain and discomfort in bilateral SI joints.  She had good response to cortisone injection to her right SI joint.  She plans to schedule left SI joint injection.  Trochanteric bursitis of both hips-she had tenderness over bilateral trochanteric bursa.  A handout on IT band stretches was given.  I will send a prescription for Flector patch.  Primary  osteoarthritis of both hands-she complains of pain and discomfort in her bilateral hands.  Joint protection muscle strengthening was discussed.  History of bilateral carpal tunnel release  DDD (degenerative disc disease), cervical-she has been experiencing increased pain and stiffness in the cervical region.  A handout on neck exercises was given.  DDD (degenerative disc disease), lumbar-she continues to have some lower back pain.  Other medical problems are listed  as follows:  Factor V deficiency ([place])  History of breast cancer  History of COPD    Orders: No orders of the defined types were placed in this encounter.  Meds ordered this encounter  Medications   diclofenac (FLECTOR) 1.3 % PTCH    Sig: Place 1 patch onto the skin 2 (two) times daily.    Dispense:  60 patch    Refill:  2     Follow-Up Instructions: Return in about 6 months (around 10/26/2021) for OA.   Bo Merino, MD  Note - This record has been created using Editor, commissioning.  Chart creation errors have been sought, but may not always  have been located. Such creation errors do not reflect on  the standard of medical care.

## 2021-04-24 ENCOUNTER — Telehealth: Payer: Self-pay | Admitting: Physical Medicine & Rehabilitation

## 2021-04-24 NOTE — Telephone Encounter (Signed)
Patient had a R Sacroiliac Injection in January.  She would like to know if she can have the left side done at her next appointment instead of a follow up.  Please advise. ?

## 2021-04-25 ENCOUNTER — Telehealth: Payer: Self-pay | Admitting: Internal Medicine

## 2021-04-25 ENCOUNTER — Ambulatory Visit (INDEPENDENT_AMBULATORY_CARE_PROVIDER_SITE_OTHER): Payer: BC Managed Care – PPO | Admitting: Rheumatology

## 2021-04-25 ENCOUNTER — Encounter: Payer: Self-pay | Admitting: Rheumatology

## 2021-04-25 ENCOUNTER — Other Ambulatory Visit: Payer: Self-pay

## 2021-04-25 VITALS — BP 145/75 | HR 83 | Ht 66.0 in | Wt 207.0 lb

## 2021-04-25 DIAGNOSIS — M19042 Primary osteoarthritis, left hand: Secondary | ICD-10-CM

## 2021-04-25 DIAGNOSIS — M5136 Other intervertebral disc degeneration, lumbar region: Secondary | ICD-10-CM

## 2021-04-25 DIAGNOSIS — R5383 Other fatigue: Secondary | ICD-10-CM | POA: Diagnosis not present

## 2021-04-25 DIAGNOSIS — Z8709 Personal history of other diseases of the respiratory system: Secondary | ICD-10-CM

## 2021-04-25 DIAGNOSIS — F5101 Primary insomnia: Secondary | ICD-10-CM | POA: Diagnosis not present

## 2021-04-25 DIAGNOSIS — Z9889 Other specified postprocedural states: Secondary | ICD-10-CM

## 2021-04-25 DIAGNOSIS — Z853 Personal history of malignant neoplasm of breast: Secondary | ICD-10-CM

## 2021-04-25 DIAGNOSIS — M19041 Primary osteoarthritis, right hand: Secondary | ICD-10-CM

## 2021-04-25 DIAGNOSIS — M7062 Trochanteric bursitis, left hip: Secondary | ICD-10-CM

## 2021-04-25 DIAGNOSIS — M5030 Other cervical disc degeneration, unspecified cervical region: Secondary | ICD-10-CM

## 2021-04-25 DIAGNOSIS — G8929 Other chronic pain: Secondary | ICD-10-CM

## 2021-04-25 DIAGNOSIS — M170 Bilateral primary osteoarthritis of knee: Secondary | ICD-10-CM

## 2021-04-25 DIAGNOSIS — M51369 Other intervertebral disc degeneration, lumbar region without mention of lumbar back pain or lower extremity pain: Secondary | ICD-10-CM

## 2021-04-25 DIAGNOSIS — M797 Fibromyalgia: Secondary | ICD-10-CM | POA: Diagnosis not present

## 2021-04-25 DIAGNOSIS — D682 Hereditary deficiency of other clotting factors: Secondary | ICD-10-CM

## 2021-04-25 DIAGNOSIS — M7061 Trochanteric bursitis, right hip: Secondary | ICD-10-CM

## 2021-04-25 DIAGNOSIS — M533 Sacrococcygeal disorders, not elsewhere classified: Secondary | ICD-10-CM

## 2021-04-25 MED ORDER — DICLOFENAC EPOLAMINE 1.3 % EX PTCH: 1.0000 | MEDICATED_PATCH | Freq: Two times a day (BID) | CUTANEOUS | 2 refills | Status: DC

## 2021-04-25 NOTE — Telephone Encounter (Signed)
Pt notified that this is NOT typically a side effect of the Medication Dexilant: Pt was encouraged to reach out to her PCP in regard to the symptoms that she is experiencing:  ?Pt verbalized understanding with all questions answered.  ? ?

## 2021-04-25 NOTE — Telephone Encounter (Signed)
Patient called this morning questioning whether or not the Dexilant would make her fatigued.  She says since she has started taking it she has been extremely tired.  Please call patient and advise.  Thank you. ?

## 2021-04-25 NOTE — Patient Instructions (Addendum)
Iliotibial Band Syndrome Rehab Ask your health care provider which exercises are safe for you. Do exercises exactly as told by your health care provider and adjust them as directed. It is normal to feel mild stretching, pulling, tightness, or discomfort as you do these exercises. Stop right away if you feel sudden pain or your pain gets significantly worse. Do not begin these exercises until told by your health care provider. Stretching and range-of-motion exercises These exercises warm up your muscles and joints and improve the movement and flexibility of your hip and pelvis. Quadriceps stretch, prone  Lie on your abdomen (prone position) on a firm surface, such as a bed or padded floor. Bend your left / right knee and reach back to hold your ankle or pant leg. If you cannot reach your ankle or pant leg, loop a belt around your foot and grab the belt instead. Gently pull your heel toward your buttocks. Your knee should not slide out to the side. You should feel a stretch in the front of your thigh and knee (quadriceps). Hold this position for __________ seconds. Repeat __________ times. Complete this exercise __________ times a day. Iliotibial band stretch An iliotibial band is a strong band of muscle tissue that runs from the outer side of your hip to the outer side of your thigh and knee. Lie on your side with your left / right leg in the top position. Bend both of your knees and grab your left / right ankle. Stretch out your bottom arm to help you balance. Slowly bring your top knee back so your thigh goes behind your trunk. Slowly lower your top leg toward the floor until you feel a gentle stretch on the outside of your left / right hip and thigh. If you do not feel a stretch and your knee will not fall farther, place the heel of your other foot on top of your knee and pull your knee down toward the floor with your foot. Hold this position for __________ seconds. Repeat __________ times.  Complete this exercise __________ times a day. Strengthening exercises These exercises build strength and endurance in your hip and pelvis. Endurance is the ability to use your muscles for a long time, even after they get tired. Straight leg raises, side-lying This exercise strengthens the muscles that rotate the leg at the hip and move it away from your body (hip abductors). Lie on your side with your left / right leg in the top position. Lie so your head, shoulder, hip, and knee line up. You may bend your bottom knee to help you balance. Roll your hips slightly forward so your hips are stacked directly over each other and your left / right knee is facing forward. Tense the muscles in your outer thigh and lift your top leg 4-6 inches (10-15 cm). Hold this position for __________ seconds. Slowly lower your leg to return to the starting position. Let your muscles relax completely before doing another repetition. Repeat __________ times. Complete this exercise __________ times a day. Leg raises, prone This exercise strengthens the muscles that move the hips backward (hip extensors). Lie on your abdomen (prone position) on your bed or a firm surface. You can put a pillow under your hips if that is more comfortable for your lower back. Bend your left / right knee so your foot is straight up in the air. Squeeze your buttocks muscles and lift your left / right thigh off the bed. Do not let your back arch. Tense  your thigh muscle as hard as you can without increasing any knee pain. Hold this position for __________ seconds. Slowly lower your leg to return to the starting position and allow it to relax completely. Repeat __________ times. Complete this exercise __________ times a day. Hip hike Stand sideways on a bottom step. Stand on your left / right leg with your other foot unsupported next to the step. You can hold on to a railing or wall for balance if needed. Keep your knees straight and your  torso square. Then lift your left / right hip up toward the ceiling. Slowly let your left / right hip lower toward the floor, past the starting position. Your foot should get closer to the floor. Do not lean or bend your knees. Repeat __________ times. Complete this exercise __________ times a day. This information is not intended to replace advice given to you by your health care provider. Make sure you discuss any questions you have with your health care provider. Document Revised: 04/13/2019 Document Reviewed: 04/13/2019 Elsevier Patient Education  2022 [employer].  Cervical Strain and Sprain Rehab Ask your health care provider which exercises are safe for you. Do exercises exactly as told by your health care provider and adjust them as directed. It is normal to feel mild stretching, pulling, tightness, or discomfort as you do these exercises. Stop right away if you feel sudden pain or your pain gets worse. Do not begin these exercises until told by your health care provider. Stretching and range-of-motion exercises Cervical side bending  Using good posture, sit on a stable chair or stand up. Without moving your shoulders, slowly tilt your left / right ear to your shoulder until you feel a stretch in the opposite side neck muscles. You should be looking straight ahead. Hold for __________ seconds. Repeat with the other side of your neck. Repeat __________ times. Complete this exercise __________ times a day. Cervical rotation  Using good posture, sit on a stable chair or stand up. Slowly turn your head to the side as if you are looking over your left / right shoulder. Keep your eyes level with the ground. Stop when you feel a stretch along the side and the back of your neck. Hold for __________ seconds. Repeat this by turning to your other side. Repeat __________ times. Complete this exercise __________ times a day. Thoracic extension and pectoral stretch Roll a towel or a small  blanket so it is about 4 inches (10 cm) in diameter. Lie down on your back on a firm surface. Put the towel lengthwise, under your spine in the middle of your back. It should not be under your shoulder blades. The towel should line up with your spine from your middle back to your lower back. Put your hands behind your head and let your elbows fall out to your sides. Hold for __________ seconds. Repeat __________ times. Complete this exercise __________ times a day. Strengthening exercises Isometric upper cervical flexion Lie on your back with a thin pillow behind your head and a small rolled-up towel under your neck. Gently tuck your chin toward your chest and nod your head down to look toward your feet. Do not lift your head off the pillow. Hold for __________ seconds. Release the tension slowly. Relax your neck muscles completely before you repeat this exercise. Repeat __________ times. Complete this exercise __________ times a day. Isometric cervical extension  Stand about 6 inches (15 cm) away from a wall, with your back facing the  wall. Place a soft object, about 6-8 inches (15-20 cm) in diameter, between the back of your head and the wall. A soft object could be a small pillow, a ball, or a folded towel. Gently tilt your head back and press into the soft object. Keep your jaw and forehead relaxed. Hold for __________ seconds. Release the tension slowly. Relax your neck muscles completely before you repeat this exercise. Repeat __________ times. Complete this exercise __________ times a day. Posture and body mechanics Body mechanics refers to the movements and positions of your body while you do your daily activities. Posture is part of body mechanics. Good posture and healthy body mechanics can help to relieve stress in your body's tissues and joints. Good posture means that your spine is in its natural S-curve position (your spine is neutral), your shoulders are pulled back slightly, and  your head is not tipped forward. The following are general guidelines for applying improved posture and body mechanics to your everyday activities. Sitting  When sitting, keep your spine neutral and keep your feet flat on the floor. Use a footrest, if necessary, and keep your thighs parallel to the floor. Avoid rounding your shoulders, and avoid tilting your head forward. When working at a desk or a computer, keep your desk at a height where your hands are slightly lower than your elbows. Slide your chair under your desk so you are close enough to maintain good posture. When working at a computer, place your monitor at a height where you are looking straight ahead and you do not have to tilt your head forward or downward to look at the screen. Standing  When standing, keep your spine neutral and keep your feet about hip-width apart. Keep a slight bend in your knees. Your ears, shoulders, and hips should line up. When you do a task in which you stand in one place for a long time, place one foot up on a stable object that is 2-4 inches (5-10 cm) high, such as a footstool. This helps keep your spine neutral. Resting When lying down and resting, avoid positions that are most painful for you. Try to support your neck in a neutral position. You can use a contour pillow or a small rolled-up towel. Your pillow should support your neck but not push on it. This information is not intended to replace advice given to you by your health care provider. Make sure you discuss any questions you have with your health care provider. Document Revised: 05/26/2018 Document Reviewed: 11/04/2017 Elsevier Patient Education  [place].

## 2021-04-26 ENCOUNTER — Telehealth: Payer: Self-pay

## 2021-04-26 DIAGNOSIS — R1012 Left upper quadrant pain: Secondary | ICD-10-CM | POA: Diagnosis not present

## 2021-04-26 DIAGNOSIS — K760 Fatty (change of) liver, not elsewhere classified: Secondary | ICD-10-CM | POA: Diagnosis not present

## 2021-04-26 DIAGNOSIS — Z6833 Body mass index (BMI) 33.0-33.9, adult: Secondary | ICD-10-CM | POA: Diagnosis not present

## 2021-04-26 DIAGNOSIS — R5383 Other fatigue: Secondary | ICD-10-CM | POA: Diagnosis not present

## 2021-04-26 DIAGNOSIS — E669 Obesity, unspecified: Secondary | ICD-10-CM | POA: Diagnosis not present

## 2021-04-26 DIAGNOSIS — K635 Polyp of colon: Secondary | ICD-10-CM | POA: Diagnosis not present

## 2021-04-26 NOTE — Telephone Encounter (Signed)
I called patient 

## 2021-04-26 NOTE — Telephone Encounter (Signed)
Patient left a voicemail stating Dr. Estanislado Pandy sent a prescription for a flex patch for her back to [facility], but was told insurance doesn't cover it.  Patient requested a return call.   ?

## 2021-04-29 ENCOUNTER — Telehealth: Payer: Self-pay

## 2021-04-29 MED ORDER — METHOCARBAMOL 500 MG PO TABS: ORAL_TABLET | ORAL | 0 refills | Status: DC

## 2021-04-29 NOTE — Telephone Encounter (Signed)
Patient called requesting prescription refill of Methocarbamol to be sent to [facility] - [facility] at [address] in [place].   ?

## 2021-04-29 NOTE — Telephone Encounter (Signed)
Next Visit: 10/28/2021 ? ?Last Visit: 04/25/2021 ? ?Last Fill: 11/17/2018 ? ?Dx: Fibromyalgia ? ?Current Dose per office note on 04/25/2021: not mentioned ? ?Okay to refill Methocarbamol?  ? ? ? ?   ?

## 2021-04-30 ENCOUNTER — Telehealth: Payer: Self-pay | Admitting: *Deleted

## 2021-04-30 DIAGNOSIS — Z23 Encounter for immunization: Secondary | ICD-10-CM | POA: Diagnosis not present

## 2021-04-30 NOTE — Telephone Encounter (Signed)
FYI: Patient wanted you to be aware that her insurance does not cover the Flector Patches either. Patient said thank you for trying.  ?

## 2021-05-03 ENCOUNTER — Encounter
Payer: BC Managed Care – PPO | Attending: Physical Medicine & Rehabilitation | Admitting: Physical Medicine & Rehabilitation

## 2021-05-03 ENCOUNTER — Encounter: Payer: Self-pay | Admitting: Physical Medicine & Rehabilitation

## 2021-05-03 ENCOUNTER — Other Ambulatory Visit: Payer: Self-pay

## 2021-05-03 VITALS — BP 131/85 | HR 79 | Temp 98.3°F | Ht 66.0 in | Wt 212.0 lb

## 2021-05-03 DIAGNOSIS — M533 Sacrococcygeal disorders, not elsewhere classified: Secondary | ICD-10-CM | POA: Diagnosis not present

## 2021-05-03 NOTE — Progress Notes (Signed)
Left sacroiliac injection under fluoroscopic guidance  Indication: Left Low back and buttocks pain not relieved by medication management and other conservative care.  Informed consent was obtained after describing risks and benefits of the procedure with the patient, this includes bleeding, bruising, infection, paralysis and medication side effects. The patient wishes to proceed and has given written consent. The patient was placed in a prone position. The lumbar and sacral area was marked and prepped with Betadine. A 25-gauge 1-1/2 inch needle was inserted into the skin and subcutaneous tissue and 1 mL of 1% lidocaine was injected. Then a 25-gauge 3 inch spinal needle was inserted under fluoroscopic guidance into the left sacroiliac joint. AP and lateral images were utilized. Isovue 200x0.5 mL under live fluoroscopy demonstrated no intravascular uptake. Then a solution containing one ML of 6 mg per mLbetamethasone and 2 ML of 2% lidocaine MPF was injected x1.5 mL. Patient tolerated the procedure well. Post procedure instructions were given. Please see post procedure form. 

## 2021-05-03 NOTE — Progress Notes (Signed)
?  PROCEDURE RECORD ?[facility] ? ? ?Name: Tracey Rodriguez ?DOB:1961/02/15 ?MRN: [id number] ? ?Date:05/03/2021  Physician: Alysia Penna, MD   ? ?Nurse/CMA: Jorja Loa MA ? ?Allergies:  ?Allergies  ?Allergen Reactions  ? Amlodipine Itching  ?  LEE ?LEE ?LEE  ? Capsaicin Anaphylaxis  ?  Pt not sure of name   ? Etodolac Shortness Of Breath  ?  REACTION: dyspnea/tightness in chest ?Other reaction(s): shortness of breath  ? Solifenacin Succinate Rash  ? Bee Venom   ? Pepto-Bismol [Bismuth] Nausea And Vomiting  ? Pork-Derived Products Nausea And Vomiting  ? Rosuvastatin Itching  ?  All statins  ?Other reaction(s): itching  ? Solifenacin   ?  Other reaction(s): rash  ? Sulfa Antibiotics   ?  Other reaction(s): Unknown  ? Sulfonamide Derivatives Nausea And Vomiting  ?  REACTION: vomiting  ? ? ?Consent Signed: Yes.    Is patient diabetic? No.  CBG today? N.A ? ?Pregnant: No. LMP: No LMP recorded. Patient is postmenopausal. (age 19-55) ? ?Anticoagulants: no ?Anti-inflammatory: no ?Antibiotics: no ? ?Procedure: Left Sacroiliac Steroid Injection  Position: Prone ?Start Time: 10:00 am  End Time: 10:08 am  Fluoro Time: 16 ? ?RN/CMA Gwyndolyn Saxon MA    ?Time 9:49 am 10:09 am    ?BP 131/85 155/84    ?Pulse 82 78    ?Respirations 16 16    ?O2 Sat 98 97    ?S/S 6 6    ?Pain Level 7/10 3/10    ? ?D/C home with Self, patient A & O X 3, D/C instructions reviewed, and sits independently. ? ? ? ? ? ? ?

## 2021-05-03 NOTE — Patient Instructions (Signed)
Sacroiliac injection was performed today. A combination of numbing medicine (lidocaine) plus a cortisone medicine (betamethasone) was injected. The injection was done under x-ray guidance. This procedure has been performed to help reduce low back and buttocks pain as well as potentially hip pain. The duration of this injection is variable lasting from hours to  Months. It may repeated if needed. 

## 2021-05-13 DIAGNOSIS — J385 Laryngeal spasm: Secondary | ICD-10-CM | POA: Diagnosis not present

## 2021-05-13 DIAGNOSIS — R490 Dysphonia: Secondary | ICD-10-CM | POA: Diagnosis not present

## 2021-05-13 DIAGNOSIS — J383 Other diseases of vocal cords: Secondary | ICD-10-CM | POA: Diagnosis not present

## 2021-05-14 ENCOUNTER — Other Ambulatory Visit: Payer: Self-pay | Admitting: Physician Assistant

## 2021-05-14 NOTE — Telephone Encounter (Signed)
Next Visit: 10/28/2021 ?  ?Last Visit: 04/25/2021 ?  ?Last Fill: 04/29/2021 ?  ?Dx: Fibromyalgia ?  ?Current Dose per office note on 04/25/2021: not mentioned ?  ?Okay to refill Methocarbamol?  ?

## 2021-05-23 DIAGNOSIS — M533 Sacrococcygeal disorders, not elsewhere classified: Secondary | ICD-10-CM | POA: Diagnosis not present

## 2021-05-23 DIAGNOSIS — M797 Fibromyalgia: Secondary | ICD-10-CM | POA: Diagnosis not present

## 2021-05-23 DIAGNOSIS — M5030 Other cervical disc degeneration, unspecified cervical region: Secondary | ICD-10-CM | POA: Diagnosis not present

## 2021-05-23 DIAGNOSIS — M5136 Other intervertebral disc degeneration, lumbar region: Secondary | ICD-10-CM | POA: Diagnosis not present

## 2021-05-30 DIAGNOSIS — M5136 Other intervertebral disc degeneration, lumbar region: Secondary | ICD-10-CM | POA: Diagnosis not present

## 2021-05-30 DIAGNOSIS — K760 Fatty (change of) liver, not elsewhere classified: Secondary | ICD-10-CM | POA: Diagnosis not present

## 2021-05-30 DIAGNOSIS — E8881 Metabolic syndrome: Secondary | ICD-10-CM | POA: Diagnosis not present

## 2021-05-30 DIAGNOSIS — M5030 Other cervical disc degeneration, unspecified cervical region: Secondary | ICD-10-CM | POA: Diagnosis not present

## 2021-05-30 DIAGNOSIS — E669 Obesity, unspecified: Secondary | ICD-10-CM | POA: Diagnosis not present

## 2021-05-30 DIAGNOSIS — M533 Sacrococcygeal disorders, not elsewhere classified: Secondary | ICD-10-CM | POA: Diagnosis not present

## 2021-05-30 DIAGNOSIS — M797 Fibromyalgia: Secondary | ICD-10-CM | POA: Diagnosis not present

## 2021-05-30 DIAGNOSIS — Z6835 Body mass index (BMI) 35.0-35.9, adult: Secondary | ICD-10-CM | POA: Diagnosis not present

## 2021-06-04 DIAGNOSIS — R490 Dysphonia: Secondary | ICD-10-CM | POA: Diagnosis not present

## 2021-06-04 DIAGNOSIS — J385 Laryngeal spasm: Secondary | ICD-10-CM | POA: Diagnosis not present

## 2021-06-04 DIAGNOSIS — J383 Other diseases of vocal cords: Secondary | ICD-10-CM | POA: Diagnosis not present

## 2021-06-04 DIAGNOSIS — L02425 Furuncle of right lower limb: Secondary | ICD-10-CM | POA: Diagnosis not present

## 2021-06-05 DIAGNOSIS — Z23 Encounter for immunization: Secondary | ICD-10-CM | POA: Diagnosis not present

## 2021-06-06 DIAGNOSIS — M5136 Other intervertebral disc degeneration, lumbar region: Secondary | ICD-10-CM | POA: Diagnosis not present

## 2021-06-06 DIAGNOSIS — M533 Sacrococcygeal disorders, not elsewhere classified: Secondary | ICD-10-CM | POA: Diagnosis not present

## 2021-06-06 DIAGNOSIS — M5030 Other cervical disc degeneration, unspecified cervical region: Secondary | ICD-10-CM | POA: Diagnosis not present

## 2021-06-06 DIAGNOSIS — M797 Fibromyalgia: Secondary | ICD-10-CM | POA: Diagnosis not present

## 2021-06-07 ENCOUNTER — Encounter: Payer: Self-pay | Admitting: Physical Medicine & Rehabilitation

## 2021-06-07 ENCOUNTER — Encounter
Payer: BC Managed Care – PPO | Attending: Physical Medicine & Rehabilitation | Admitting: Physical Medicine & Rehabilitation

## 2021-06-07 VITALS — BP 133/76 | HR 76 | Ht 66.0 in | Wt 215.0 lb

## 2021-06-07 DIAGNOSIS — M533 Sacrococcygeal disorders, not elsewhere classified: Secondary | ICD-10-CM | POA: Insufficient documentation

## 2021-06-07 NOTE — Progress Notes (Signed)
? ?Subjective:  ? ? Patient ID: Tracey Rodriguez, female    DOB: 01-Apr-1960, 61 y.o.   MRN: [id number] ? ?HPI ? ?61 year old female with chronic buttock pain with sacroiliac disorder.  Her exercise tolerance has improved since the sacroiliac injections. ? ?R SI inj 03/07/21 ?L SI inj 05/03/21 ? ?Does massage therapy which helps ?Walking for exercise ~1mles , 3-4  x per wk ?Pt doing some core exercise as well ?Pain Inventory ?Average Pain 9 ?Pain Right Now 7 ?My pain is constant, sharp, dull, stabbing, tingling, and aching ? ?In the last 24 hours, has pain interfered with the following? ?General activity 5 ?Relation with others 0 ?Enjoyment of life 4 ?What TIME of day is your pain at its worst? morning  and evening ?Sleep (in general)  fair to good ? ?Pain is worse with: walking, bending, sitting, inactivity, and standing ?Pain improves with: rest, heat/ice, and therapy/exercise ?Relief from Meds: 7 ? ?Family History  ?Problem Relation Age of Onset  ? Breast cancer Mother 666 ? Ovarian cancer Mother   ? Heart disease Father 421 ? Heart attack Father   ? Clotting disorder Father   ? Rheumatologic disease Father   ? Crohn's disease Father   ? Heart attack Brother   ? Cancer Brother   ? Lung cancer Brother   ? Colon polyps Brother   ? Throat cancer Brother   ? Colon polyps Brother   ? Stroke Brother   ? Breast cancer Maternal Aunt 60  ? Stroke Paternal Aunt   ? Heart disease Paternal Aunt   ? Kidney disease Paternal Uncle   ? Liver cancer Maternal Grandmother   ? Heart disease Maternal Grandfather   ? Lung cancer Maternal Grandfather   ? Irritable bowel syndrome Son   ? ?Social History  ? ?Socioeconomic History  ? Marital status: Married  ?  Spouse name: Not on file  ? Number of children: 2  ? Years of education: Not on file  ? Highest education level: Not on file  ?Occupational History  ? Not on file  ?Tobacco Use  ? Smoking status: Former  ?  Packs/day: 3.00  ?  Years: 22.00  ?  Pack years: 66.00  ?  Types: Cigarettes   ?  Quit date: 02/18/2000  ?  Years since quitting: 21.3  ?  Passive exposure: Never  ? Smokeless tobacco: Never  ?Vaping Use  ? Vaping Use: Never used  ?Substance and Sexual Activity  ? Alcohol use: Yes  ?  Alcohol/week: 0.0 standard drinks  ?  Comment: RARELY  ? Drug use: Not Currently  ? Sexual activity: Yes  ?  Birth control/protection: Post-menopausal  ?Other Topics Concern  ? Not on file  ?Social History Narrative  ? She is married, has children.  They are grown.  1 son and 1 daughter has at least 1 grandchild  ? Former smoker no significant alcohol no drug use  ? ?Social Determinants of Health  ? ?Financial Resource Strain: Not on file  ?Food Insecurity: Not on file  ?Transportation Needs: Not on file  ?Physical Activity: Not on file  ?Stress: Not on file  ?Social Connections: Not on file  ? ?Past Surgical History:  ?Procedure Laterality Date  ? ABLATION    ? APPENDECTOMY    ? BREAST EXCISIONAL BIOPSY Left   ? BREAST SURGERY    ? left- lumpectomy  ? DILATATION & CURRETTAGE/HYSTEROSCOPY WITH RESECTOCOPE N/A 04/05/2012  ? Procedure: hysteroscopy with endocervical curretting;  Surgeon: Allyn Kenner, DO;  Location: [facility];  Service: Gynecology;  Laterality: N/A;  ? DILATION AND CURETTAGE OF UTERUS    ? EYE SURGERY Bilateral 10/2017  ? cataract extraction   ? LIVER BIOPSY  09/2018  ? NOSE SURGERY    ? VAGINAL HYSTERECTOMY  09/10/2020  ? ?Past Surgical History:  ?Procedure Laterality Date  ? ABLATION    ? APPENDECTOMY    ? BREAST EXCISIONAL BIOPSY Left   ? BREAST SURGERY    ? left- lumpectomy  ? DILATATION & CURRETTAGE/HYSTEROSCOPY WITH RESECTOCOPE N/A 04/05/2012  ? Procedure: hysteroscopy with endocervical curretting;  Surgeon: Allyn Kenner, DO;  Location: [facility];  Service: Gynecology;  Laterality: N/A;  ? DILATION AND CURETTAGE OF UTERUS    ? EYE SURGERY Bilateral 10/2017  ? cataract extraction   ? LIVER BIOPSY  09/2018  ? NOSE SURGERY    ? VAGINAL HYSTERECTOMY  09/10/2020  ? ?Past Medical History:   ?Diagnosis Date  ? Arthritis   ? deg disc disease  - lower back, neck  ? Cancer [facility])   ? left breast surgery-lumpectomy  ? Disorder of vocal cord   ? Factor V deficiency ([place])   ? Fibromyalgia   ? GERD (gastroesophageal reflux disease)   ? Heart murmur   ? Hx of adenomatous polyp of colon 02/04/2018  ? Hyperlipidemia   ? Hypothyroidism   ? goiter  ? NAFLD (nonalcoholic fatty liver disease)   ? Neuromuscular disorder ([place])   ? sciatic nerve  ? PONV (postoperative nausea and vomiting)   ? Rectocele 01/27/2018  ? SVD (spontaneous vaginal delivery)   ? x 2  ? ?There were no vitals taken for this visit. ? ?Opioid Risk Score:   ?Fall Risk Score:  `1 ? ?Depression screen PHQ 2/9 ? ? ?  05/03/2021  ?  9:44 AM 03/07/2021  ?  2:35 PM 01/08/2021  ? 10:10 AM  ?Depression screen PHQ 2/9  ?Decreased Interest 0 0 0  ?Down, Depressed, Hopeless 0 0 0  ?PHQ - 2 Score 0 0 0  ?Altered sleeping   2  ?Tired, decreased energy   2  ?Change in appetite   0  ?Feeling bad or failure about yourself    0  ?Trouble concentrating   0  ?Moving slowly or fidgety/restless   0  ?Suicidal thoughts   0  ?PHQ-9 Score   4  ?Difficult doing work/chores   Somewhat difficult  ? ? ?Review of Systems  ?Musculoskeletal:  Positive for arthralgias, back pain and neck pain.  ?     Pain in both arms & legs  ?All other systems reviewed and are negative. ? ?   ?Objective:  ? Physical Exam ?Vitals and nursing note reviewed.  ?HENT:  ?   Head: Normocephalic and atraumatic.  ?Eyes:  ?   Extraocular Movements: Extraocular movements intact.  ?   Conjunctiva/sclera: Conjunctivae normal.  ?   Pupils: Pupils are equal, round, and reactive to light.  ?Skin: ?   General: Skin is warm and dry.  ?Neurological:  ?   Mental Status: She is alert and oriented to person, place, and time.  ?Psychiatric:     ?   Mood and Affect: Mood normal.     ?   Behavior: Behavior normal.  ? ? ?Sacral thrust (prone) : Positive bilaterally ?Lateral compression: Negative ?FABER's: Positive right  greater than left ?Distraction (supine): Positive right greater than left ?Thigh thrust test: Positive bilaterally ? ? ? ? ?   ?  Assessment & Plan:  ?1.  Bilateral sacroiliac disorder improved with sacroiliac intra-articular injections.  Would recommend bilateral injections at next visit in approximately 1 month and then every 3 to 4 months after that. ?Continue walking, advance mileage slowly ? ?

## 2021-06-07 NOTE — Patient Instructions (Signed)

## 2021-06-13 DIAGNOSIS — M5136 Other intervertebral disc degeneration, lumbar region: Secondary | ICD-10-CM | POA: Diagnosis not present

## 2021-06-13 DIAGNOSIS — M797 Fibromyalgia: Secondary | ICD-10-CM | POA: Diagnosis not present

## 2021-06-13 DIAGNOSIS — M533 Sacrococcygeal disorders, not elsewhere classified: Secondary | ICD-10-CM | POA: Diagnosis not present

## 2021-06-13 DIAGNOSIS — M5030 Other cervical disc degeneration, unspecified cervical region: Secondary | ICD-10-CM | POA: Diagnosis not present

## 2021-06-20 DIAGNOSIS — M533 Sacrococcygeal disorders, not elsewhere classified: Secondary | ICD-10-CM | POA: Diagnosis not present

## 2021-06-20 DIAGNOSIS — M5030 Other cervical disc degeneration, unspecified cervical region: Secondary | ICD-10-CM | POA: Diagnosis not present

## 2021-06-20 DIAGNOSIS — M5136 Other intervertebral disc degeneration, lumbar region: Secondary | ICD-10-CM | POA: Diagnosis not present

## 2021-06-20 DIAGNOSIS — M797 Fibromyalgia: Secondary | ICD-10-CM | POA: Diagnosis not present

## 2021-06-21 DIAGNOSIS — H6503 Acute serous otitis media, bilateral: Secondary | ICD-10-CM | POA: Diagnosis not present

## 2021-06-21 DIAGNOSIS — R42 Dizziness and giddiness: Secondary | ICD-10-CM | POA: Diagnosis not present

## 2021-06-25 DIAGNOSIS — L570 Actinic keratosis: Secondary | ICD-10-CM | POA: Diagnosis not present

## 2021-06-25 DIAGNOSIS — D225 Melanocytic nevi of trunk: Secondary | ICD-10-CM | POA: Diagnosis not present

## 2021-06-25 DIAGNOSIS — L821 Other seborrheic keratosis: Secondary | ICD-10-CM | POA: Diagnosis not present

## 2021-06-25 DIAGNOSIS — L814 Other melanin hyperpigmentation: Secondary | ICD-10-CM | POA: Diagnosis not present

## 2021-06-26 DIAGNOSIS — H6503 Acute serous otitis media, bilateral: Secondary | ICD-10-CM | POA: Diagnosis not present

## 2021-06-27 DIAGNOSIS — M5136 Other intervertebral disc degeneration, lumbar region: Secondary | ICD-10-CM | POA: Diagnosis not present

## 2021-06-27 DIAGNOSIS — M533 Sacrococcygeal disorders, not elsewhere classified: Secondary | ICD-10-CM | POA: Diagnosis not present

## 2021-06-27 DIAGNOSIS — M5030 Other cervical disc degeneration, unspecified cervical region: Secondary | ICD-10-CM | POA: Diagnosis not present

## 2021-06-27 DIAGNOSIS — M797 Fibromyalgia: Secondary | ICD-10-CM | POA: Diagnosis not present

## 2021-07-09 DIAGNOSIS — M533 Sacrococcygeal disorders, not elsewhere classified: Secondary | ICD-10-CM | POA: Diagnosis not present

## 2021-07-09 DIAGNOSIS — M5030 Other cervical disc degeneration, unspecified cervical region: Secondary | ICD-10-CM | POA: Diagnosis not present

## 2021-07-09 DIAGNOSIS — M797 Fibromyalgia: Secondary | ICD-10-CM | POA: Diagnosis not present

## 2021-07-09 DIAGNOSIS — M5136 Other intervertebral disc degeneration, lumbar region: Secondary | ICD-10-CM | POA: Diagnosis not present

## 2021-07-11 DIAGNOSIS — J385 Laryngeal spasm: Secondary | ICD-10-CM | POA: Diagnosis not present

## 2021-07-11 DIAGNOSIS — E8881 Metabolic syndrome: Secondary | ICD-10-CM | POA: Diagnosis not present

## 2021-07-11 DIAGNOSIS — Z6835 Body mass index (BMI) 35.0-35.9, adult: Secondary | ICD-10-CM | POA: Diagnosis not present

## 2021-07-11 DIAGNOSIS — K760 Fatty (change of) liver, not elsewhere classified: Secondary | ICD-10-CM | POA: Diagnosis not present

## 2021-07-11 DIAGNOSIS — J383 Other diseases of vocal cords: Secondary | ICD-10-CM | POA: Diagnosis not present

## 2021-07-11 DIAGNOSIS — E669 Obesity, unspecified: Secondary | ICD-10-CM | POA: Diagnosis not present

## 2021-07-11 DIAGNOSIS — R490 Dysphonia: Secondary | ICD-10-CM | POA: Diagnosis not present

## 2021-07-16 ENCOUNTER — Encounter: Payer: Self-pay | Admitting: Physical Medicine & Rehabilitation

## 2021-07-16 ENCOUNTER — Encounter
Payer: BC Managed Care – PPO | Attending: Physical Medicine & Rehabilitation | Admitting: Physical Medicine & Rehabilitation

## 2021-07-16 VITALS — BP 137/86 | HR 74 | Ht 66.0 in | Wt 212.6 lb

## 2021-07-16 DIAGNOSIS — M533 Sacrococcygeal disorders, not elsewhere classified: Secondary | ICD-10-CM

## 2021-07-16 NOTE — Progress Notes (Signed)
Bilateral sacroiliac injections   Indication: Left Low back and buttocks pain not relieved by medication management and other conservative care.  Informed consent was obtained after describing risks and benefits of the procedure with the patient, this includes bleeding, bruising, infection, paralysis and medication side effects. The patient wishes to proceed and has given written consent. The patient was placed in a prone position. The lumbar and sacral area was marked and prepped with Betadine. A 25-gauge 1-1/2 inch needle was inserted into the skin and subcutaneous tissue and 1 mL of 1% lidocaine was injected. Then a 25-gauge 3 inch spinal needle was inserted under fluoroscopic guidance into the left sacroiliac joint. AP and lateral images were utilized. Isovue 200x0.5 mL under live fluoroscopy demonstrated no intravascular uptake. Then a solution containing one ML of 6 mg per mLbetamethasone and 2 ML of 2% lidocaine MPF was injected x1.5 mL.This same procedure was performed on the right side with same needle , technique and injectate.  Patient tolerated the procedure well. Post procedure instructions were given. Please see post procedure form.

## 2021-07-16 NOTE — Progress Notes (Signed)
  PROCEDURE RECORD [facility]   Name: Tracey Rodriguez DOB:1960/12/13 MRN: [id number]  Date:07/16/2021  Physician: Alysia Penna, MD    Nurse/CMA: Patrice Matthew RN  Allergies:  Allergies  Allergen Reactions   Amlodipine Itching    LEE LEE LEE   Capsaicin Anaphylaxis    Pt not sure of name    Etodolac Shortness Of Breath    REACTION: dyspnea/tightness in chest Other reaction(s): shortness of breath Other reaction(s): shortness of breath Other reaction(s): shortness of breath Other reaction(s): shortness of breath   Solifenacin Rash    Other reaction(s): rash Other reaction(s): rash Other reaction(s): rash   Solifenacin Succinate Rash   Bee Venom    Pepto-Bismol [Bismuth] Nausea And Vomiting   Pork-Derived Products Nausea And Vomiting    Other reaction(s): vomiting Other reaction(s): vomiting   Rosuvastatin Itching    All statins  Other reaction(s): itching Other reaction(s): itching Other reaction(s): itching   Sulfa Antibiotics     Other reaction(s): Unknown Other reaction(s): Unknown Other reaction(s): Unknown   Sulfonamide Derivatives Nausea And Vomiting    REACTION: vomiting    Consent Signed: Yes.    Is patient diabetic? No.  CBG today?   Pregnant: No. LMP: No LMP recorded. Patient is postmenopausal. (age 32-55)  Anticoagulants: no Anti-inflammatory: no Antibiotics: no  Procedure: bilateral sacroiliac steroid injections  Position: Prone Start Time: 339  End Time: 346  Fluoro Time: 36 sec  RN/CMA Health and safety inspector RN    Time 318 p 348    BP 137/86 151/82    Pulse 74 73    Respirations 14 14    O2 Sat 96 98    S/S 6 6    Pain Level 7/10 0     D/C home with self, patient A & O X 3, D/C instructions reviewed, and sits independently.

## 2021-07-16 NOTE — Patient Instructions (Signed)
Sacroiliac injection was performed today. A combination of numbing medicine (lidocaine) plus a cortisone medicine (betamethasone) was injected. The injection was done under x-ray guidance. This procedure has been performed to help reduce low back and buttocks pain as well as potentially hip pain. The duration of this injection is variable lasting from hours to  Months. It may repeated if needed. 

## 2021-07-18 DIAGNOSIS — M797 Fibromyalgia: Secondary | ICD-10-CM | POA: Diagnosis not present

## 2021-07-18 DIAGNOSIS — M5136 Other intervertebral disc degeneration, lumbar region: Secondary | ICD-10-CM | POA: Diagnosis not present

## 2021-07-18 DIAGNOSIS — M5030 Other cervical disc degeneration, unspecified cervical region: Secondary | ICD-10-CM | POA: Diagnosis not present

## 2021-07-18 DIAGNOSIS — M533 Sacrococcygeal disorders, not elsewhere classified: Secondary | ICD-10-CM | POA: Diagnosis not present

## 2021-07-24 DIAGNOSIS — N3946 Mixed incontinence: Secondary | ICD-10-CM | POA: Diagnosis not present

## 2021-07-24 DIAGNOSIS — N3281 Overactive bladder: Secondary | ICD-10-CM | POA: Diagnosis not present

## 2021-07-25 DIAGNOSIS — M5136 Other intervertebral disc degeneration, lumbar region: Secondary | ICD-10-CM | POA: Diagnosis not present

## 2021-07-25 DIAGNOSIS — M797 Fibromyalgia: Secondary | ICD-10-CM | POA: Diagnosis not present

## 2021-07-25 DIAGNOSIS — M5030 Other cervical disc degeneration, unspecified cervical region: Secondary | ICD-10-CM | POA: Diagnosis not present

## 2021-07-25 DIAGNOSIS — M533 Sacrococcygeal disorders, not elsewhere classified: Secondary | ICD-10-CM | POA: Diagnosis not present

## 2021-07-29 DIAGNOSIS — J383 Other diseases of vocal cords: Secondary | ICD-10-CM | POA: Diagnosis not present

## 2021-07-29 DIAGNOSIS — R490 Dysphonia: Secondary | ICD-10-CM | POA: Diagnosis not present

## 2021-07-29 DIAGNOSIS — J385 Laryngeal spasm: Secondary | ICD-10-CM | POA: Diagnosis not present

## 2021-08-08 DIAGNOSIS — M5136 Other intervertebral disc degeneration, lumbar region: Secondary | ICD-10-CM | POA: Diagnosis not present

## 2021-08-08 DIAGNOSIS — M5030 Other cervical disc degeneration, unspecified cervical region: Secondary | ICD-10-CM | POA: Diagnosis not present

## 2021-08-08 DIAGNOSIS — M533 Sacrococcygeal disorders, not elsewhere classified: Secondary | ICD-10-CM | POA: Diagnosis not present

## 2021-08-08 DIAGNOSIS — M797 Fibromyalgia: Secondary | ICD-10-CM | POA: Diagnosis not present

## 2021-08-13 DIAGNOSIS — E039 Hypothyroidism, unspecified: Secondary | ICD-10-CM | POA: Diagnosis not present

## 2021-08-13 DIAGNOSIS — M797 Fibromyalgia: Secondary | ICD-10-CM | POA: Diagnosis not present

## 2021-08-13 DIAGNOSIS — M5136 Other intervertebral disc degeneration, lumbar region: Secondary | ICD-10-CM | POA: Diagnosis not present

## 2021-08-13 DIAGNOSIS — E785 Hyperlipidemia, unspecified: Secondary | ICD-10-CM | POA: Diagnosis not present

## 2021-08-13 DIAGNOSIS — M533 Sacrococcygeal disorders, not elsewhere classified: Secondary | ICD-10-CM | POA: Diagnosis not present

## 2021-08-13 DIAGNOSIS — Z0000 Encounter for general adult medical examination without abnormal findings: Secondary | ICD-10-CM | POA: Diagnosis not present

## 2021-08-13 DIAGNOSIS — M5030 Other cervical disc degeneration, unspecified cervical region: Secondary | ICD-10-CM | POA: Diagnosis not present

## 2021-08-13 DIAGNOSIS — R739 Hyperglycemia, unspecified: Secondary | ICD-10-CM | POA: Diagnosis not present

## 2021-08-14 ENCOUNTER — Other Ambulatory Visit: Payer: Self-pay | Admitting: Rheumatology

## 2021-08-14 NOTE — Telephone Encounter (Signed)
Next Visit: 10/28/2021   Last Visit: 04/25/2021   Last Fill: 05/14/2021   Dx: Fibromyalgia   Current Dose per office note on 04/25/2021: not mentioned   Okay to refill Methocarbamol?

## 2021-09-05 ENCOUNTER — Encounter
Payer: BC Managed Care – PPO | Attending: Physical Medicine & Rehabilitation | Admitting: Physical Medicine & Rehabilitation

## 2021-09-05 ENCOUNTER — Encounter: Payer: Self-pay | Admitting: Physical Medicine & Rehabilitation

## 2021-09-05 VITALS — BP 131/77 | HR 81 | Ht 66.0 in | Wt 211.2 lb

## 2021-09-05 DIAGNOSIS — M533 Sacrococcygeal disorders, not elsewhere classified: Secondary | ICD-10-CM | POA: Insufficient documentation

## 2021-09-05 NOTE — Patient Instructions (Signed)
Sacroiliac nerve blocks to evaluate if radiofrequency neurotomy would be helpful

## 2021-09-05 NOTE — Progress Notes (Signed)
Subjective:    Patient ID: Tracey Rodriguez, female    DOB: [birth year], 61 y.o.   MRN: [id number]  HPI  Bilateral sacroiliac injections 07/16/2021 which provided 4-5 d of complete low back /buttock pain relief Patient at this point that is not lasting longer.  Patient has tried medication management as well as therapy.  With incomplete relief. Discussed other options Pain Inventory Average Pain 7 Pain Right Now 8 My pain is constant and aching  In the last 24 hours, has pain interfered with the following? General activity 8 Relation with others 7 Enjoyment of life 8 What TIME of day is your pain at its worst? morning , daytime, evening, and night Sleep (in general) Fair  Pain is worse with: walking, bending, sitting, inactivity, standing, and some activites Pain improves with: injections Relief from Meds:  na  Family History  Problem Relation Age of Onset   Breast cancer Mother 86   Ovarian cancer Mother    Heart disease Father 94   Heart attack Father    Clotting disorder Father    Rheumatologic disease Father    Crohn's disease Father    Heart attack Brother    Cancer Brother    Lung cancer Brother    Colon polyps Brother    Throat cancer Brother    Colon polyps Brother    Stroke Brother    Breast cancer Maternal Aunt 47   Stroke Paternal Aunt    Heart disease Paternal Aunt    Kidney disease Paternal Uncle    Liver cancer Maternal Grandmother    Heart disease Maternal Grandfather    Lung cancer Maternal Grandfather    Irritable bowel syndrome Son    Social History   Socioeconomic History   Marital status: Married    Spouse name: Not on file   Number of children: 2   Years of education: Not on file   Highest education level: Not on file  Occupational History   Not on file  Tobacco Use   Smoking status: Former    Packs/day: 3.00    Years: 22.00    Total pack years: 66.00    Types: Cigarettes    Quit date: 02/18/2000    Years since quitting: 21.5     Passive exposure: Never   Smokeless tobacco: Never  Vaping Use   Vaping Use: Never used  Substance and Sexual Activity   Alcohol use: Yes    Alcohol/week: 0.0 standard drinks of alcohol    Comment: RARELY   Drug use: Not Currently   Sexual activity: Yes    Birth control/protection: Post-menopausal  Other Topics Concern   Not on file  Social History Narrative   She is married, has children.  They are grown.  1 son and 1 daughter has at least 1 grandchild   Former smoker no significant alcohol no drug use   Social Determinants of Radio broadcast assistant Strain: Not on file  Food Insecurity: Not on file  Transportation Needs: Not on file  Physical Activity: Not on file  Stress: Not on file  Social Connections: Not on file   Past Surgical History:  Procedure Laterality Date   ABLATION     APPENDECTOMY     BREAST EXCISIONAL BIOPSY Left    BREAST SURGERY     left- lumpectomy   DILATATION & CURRETTAGE/HYSTEROSCOPY WITH RESECTOCOPE N/A 04/05/2012   Procedure: hysteroscopy with endocervical curretting;  Surgeon: Allyn Kenner, DO;  Location: [facility];  Service: Gynecology;  Laterality: N/A;   DILATION AND CURETTAGE OF UTERUS     EYE SURGERY Bilateral 10/2017   cataract extraction    LIVER BIOPSY  09/2018   NOSE SURGERY     VAGINAL HYSTERECTOMY  09/10/2020   Past Surgical History:  Procedure Laterality Date   ABLATION     APPENDECTOMY     BREAST EXCISIONAL BIOPSY Left    BREAST SURGERY     left- lumpectomy   DILATATION & CURRETTAGE/HYSTEROSCOPY WITH RESECTOCOPE N/A 04/05/2012   Procedure: hysteroscopy with endocervical curretting;  Surgeon: Allyn Kenner, DO;  Location: [facility];  Service: Gynecology;  Laterality: N/A;   DILATION AND CURETTAGE OF UTERUS     EYE SURGERY Bilateral 10/2017   cataract extraction    LIVER BIOPSY  09/2018   NOSE SURGERY     VAGINAL HYSTERECTOMY  09/10/2020   Past Medical History:  Diagnosis Date   Arthritis    deg disc disease  -  lower back, neck   Cancer ([place])    left breast surgery-lumpectomy   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 (HCC)    Fibromyalgia    GERD (gastroesophageal reflux disease)    Heart murmur    Hx of adenomatous polyp of colon 02/04/2018   Hyperlipidemia    Hypothyroidism    goiter   NAFLD (nonalcoholic fatty liver disease)    Neuromuscular disorder (HCC)    sciatic nerve   PONV (postoperative nausea and vomiting)    Rectocele 01/27/2018   SVD (spontaneous vaginal delivery)    x 2   BP 131/77   Pulse 81   Ht '5\' 6"'$  (1.676 m)   Wt 211 lb 3.2 oz (95.8 kg)   SpO2 93%   BMI 34.09 kg/m   Opioid Risk Score:   Fall Risk Score:  `1  Depression screen [facility] 2/9     09/05/2021   11:44 AM 07/16/2021    3:04 PM 05/03/2021    9:44 AM 03/07/2021    2:35 PM 01/08/2021   10:10 AM  Depression screen PHQ 2/9  Decreased Interest 0 0 0 0 0  Down, Depressed, Hopeless 0 0 0 0 0  PHQ - 2 Score 0 0 0 0 0  Altered sleeping     2  Tired, decreased energy     2  Change in appetite     0  Feeling bad or failure about yourself      0  Trouble concentrating     0  Moving slowly or fidgety/restless     0  Suicidal thoughts     0  PHQ-9 Score     4  Difficult doing work/chores     Somewhat difficult     Review of Systems  Constitutional: Negative.   HENT: Negative.    Eyes: Negative.   Respiratory: Negative.    Cardiovascular: Negative.   Gastrointestinal: Negative.   Endocrine: Negative.   Genitourinary: Negative.   Musculoskeletal:  Positive for back pain.  Skin: Negative.   Allergic/Immunologic: Negative.   Neurological: Negative.   Hematological: Negative.   Psychiatric/Behavioral: Negative.    All other systems reviewed and are negative.      Objective:   Physical Exam Vitals and nursing note reviewed.  Constitutional:      Appearance: She is normal weight.  HENT:     Head: Normocephalic and atraumatic.  Eyes:     Extraocular Movements: Extraocular movements intact.      Conjunctiva/sclera: Conjunctivae normal.  Pupils: Pupils are equal, round, and reactive to light.  Musculoskeletal:     Comments:  Sacral thrust (prone) : Positive bilaterally Lateral compression: Negative FABER's: Positive bilaterally Distraction (supine): Negative Thigh thrust test: Positive bilaterally  Skin:    General: Skin is warm and dry.  Neurological:     Mental Status: She is alert and oriented to person, place, and time.  Psychiatric:        Mood and Affect: Mood normal.        Behavior: Behavior normal.   Negative straight leg raise Mild pain palpation lumbar paraspinal area Lumbar range of motion 75% with no        Assessment & Plan:  With flexion and extension. 1.  Chronic upper buttocks pain with 100% relief on short-term basis following sacroiliac injections done bilaterally.  Recommend sacroiliac nerve blocks to see whether sacroiliac radiofrequency neurotomy would be helpful.  We will do bilateral

## 2021-09-17 DIAGNOSIS — J0101 Acute recurrent maxillary sinusitis: Secondary | ICD-10-CM | POA: Diagnosis not present

## 2021-09-17 DIAGNOSIS — E039 Hypothyroidism, unspecified: Secondary | ICD-10-CM | POA: Diagnosis not present

## 2021-09-19 DIAGNOSIS — E669 Obesity, unspecified: Secondary | ICD-10-CM | POA: Diagnosis not present

## 2021-09-19 DIAGNOSIS — K5900 Constipation, unspecified: Secondary | ICD-10-CM | POA: Diagnosis not present

## 2021-09-19 DIAGNOSIS — Z6833 Body mass index (BMI) 33.0-33.9, adult: Secondary | ICD-10-CM | POA: Diagnosis not present

## 2021-09-19 DIAGNOSIS — R634 Abnormal weight loss: Secondary | ICD-10-CM | POA: Diagnosis not present

## 2021-09-24 DIAGNOSIS — M797 Fibromyalgia: Secondary | ICD-10-CM | POA: Diagnosis not present

## 2021-09-24 DIAGNOSIS — M533 Sacrococcygeal disorders, not elsewhere classified: Secondary | ICD-10-CM | POA: Diagnosis not present

## 2021-09-24 DIAGNOSIS — M5030 Other cervical disc degeneration, unspecified cervical region: Secondary | ICD-10-CM | POA: Diagnosis not present

## 2021-09-24 DIAGNOSIS — M5136 Other intervertebral disc degeneration, lumbar region: Secondary | ICD-10-CM | POA: Diagnosis not present

## 2021-09-25 DIAGNOSIS — L814 Other melanin hyperpigmentation: Secondary | ICD-10-CM | POA: Diagnosis not present

## 2021-09-25 DIAGNOSIS — L570 Actinic keratosis: Secondary | ICD-10-CM | POA: Diagnosis not present

## 2021-09-25 DIAGNOSIS — L821 Other seborrheic keratosis: Secondary | ICD-10-CM | POA: Diagnosis not present

## 2021-09-27 DIAGNOSIS — H18613 Keratoconus, stable, bilateral: Secondary | ICD-10-CM | POA: Diagnosis not present

## 2021-09-27 DIAGNOSIS — H0288A Meibomian gland dysfunction right eye, upper and lower eyelids: Secondary | ICD-10-CM | POA: Diagnosis not present

## 2021-09-27 DIAGNOSIS — H0288B Meibomian gland dysfunction left eye, upper and lower eyelids: Secondary | ICD-10-CM | POA: Diagnosis not present

## 2021-09-27 DIAGNOSIS — Z888 Allergy status to other drugs, medicaments and biological substances status: Secondary | ICD-10-CM | POA: Diagnosis not present

## 2021-09-27 DIAGNOSIS — Z961 Presence of intraocular lens: Secondary | ICD-10-CM | POA: Diagnosis not present

## 2021-09-27 DIAGNOSIS — Z882 Allergy status to sulfonamides status: Secondary | ICD-10-CM | POA: Diagnosis not present

## 2021-09-27 DIAGNOSIS — H04123 Dry eye syndrome of bilateral lacrimal glands: Secondary | ICD-10-CM | POA: Diagnosis not present

## 2021-10-03 DIAGNOSIS — M797 Fibromyalgia: Secondary | ICD-10-CM | POA: Diagnosis not present

## 2021-10-03 DIAGNOSIS — M5136 Other intervertebral disc degeneration, lumbar region: Secondary | ICD-10-CM | POA: Diagnosis not present

## 2021-10-03 DIAGNOSIS — M533 Sacrococcygeal disorders, not elsewhere classified: Secondary | ICD-10-CM | POA: Diagnosis not present

## 2021-10-03 DIAGNOSIS — M5030 Other cervical disc degeneration, unspecified cervical region: Secondary | ICD-10-CM | POA: Diagnosis not present

## 2021-10-14 NOTE — Progress Notes (Unsigned)
Office Visit Note  Patient: Tracey Rodriguez             Date of Birth: [birth year]           MRN: [id number]             PCP: Aura Dials, MD Referring: Aura Dials, MD Visit Date: 10/28/2021 Occupation: '@GUAROCC'$ @  Subjective:  Low back pain   History of Present Illness: NURA CAHOON is a 61 y.o. female with history of fibromyalgia, DDD, and osteoarthritis.  Patient presents today with ongoing pain in her neck and lower back.  She had a recent spinal block and is scheduled next week for a another procedure.  She has been getting massages on a weekly basis which has been alleviating some of her generalized myofascial pain due to fibromyalgia.  She continues to have ongoing discomfort due to trochanter bursitis of both hips.  She remains on Lyrica 25 mg at bedtime and takes methocarbamol 500 mg twice daily as needed during flares. She experiences intermittent discomfort in both knee joints but denies any joint swelling.  She had Visco gel injections in both knees in September/October 2020 to which provided significant relief.  She would like to reapply for Visco gel injections for both knees.  Activities of Daily Living:  Patient reports morning stiffness for all day. Patient Reports nocturnal pain.  Difficulty dressing/grooming: Reports Difficulty climbing stairs: Reports Difficulty getting out of chair: Reports Difficulty using hands for taps, buttons, cutlery, and/or writing: Reports  Review of Systems  Constitutional:  Negative for fatigue.  HENT:  Positive for mouth dryness. Negative for mouth sores and nose dryness.   Eyes:  Positive for dryness. Negative for pain and visual disturbance.  Respiratory:  Negative for cough, hemoptysis and difficulty breathing.   Cardiovascular:  Negative for chest pain, palpitations, hypertension and swelling in legs/feet.  Gastrointestinal:  Positive for constipation. Negative for blood in stool and diarrhea.  Genitourinary:   Positive for involuntary urination. Negative for painful urination.  Musculoskeletal:  Positive for joint pain, joint pain and morning stiffness. Negative for gait problem, joint swelling, myalgias, muscle weakness, muscle tenderness and myalgias.  Skin:  Positive for color change, hair loss and sensitivity to sunlight. Negative for pallor, rash, nodules/bumps, skin tightness and ulcers.  Allergic/Immunologic: Negative for susceptible to infections.  Neurological:  Negative for dizziness, numbness, headaches and weakness.  Hematological:  Negative for swollen glands.  Psychiatric/Behavioral:  Negative for depressed mood and sleep disturbance. The patient is not nervous/anxious.     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   Vocal cord dysfunction 05/21/2020   Chronic cough 04/19/2020   Sensorineural hearing loss (SNHL), bilateral 04/04/2020   Osteopenia 12/01/2019   NAFLD (nonalcoholic fatty liver disease) 09/29/2019   Obesity (BMI 30.0-34.9) 09/29/2019   Hyperlipidemia 09/09/2018   Prediabetes 09/09/2018   Hx of adenomatous polyp of colon 02/04/2018   Rectocele 01/27/2018   Change in bowel habits 01/05/2018   Hemorrhoids 01/05/2018   Hoarseness 07/30/2017   Gastroesophageal reflux disease 07/30/2017   Pure hypercholesterolemia 06/03/2017   History of breast cancer 09/11/2016   Other fatigue 03/15/2016   Primary insomnia 03/15/2016   Primary osteoarthritis of both hands 03/15/2016   DDD cervical spine 03/15/2016   Osteoarthritis of lumbar spine 03/15/2016   History of bilateral carpal tunnel release 03/15/2016   Unspecified hypothyroidism 03/30/2013   Fibromyalgia 03/30/2013   Breast  cancer ([place]) 03/30/2013   Factor V deficiency ([place]) 03/30/2013   Dyspnea 02/02/2013   Restrictive lung disease 02/02/2013    Past Medical History:  Diagnosis Date   Arthritis    deg disc disease  -  lower back, neck   Cancer ([place])    left breast surgery-lumpectomy   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 ([place])    Fibromyalgia    GERD (gastroesophageal reflux disease)    Heart murmur    Hx of adenomatous polyp of colon 02/04/2018   Hyperlipidemia    Hypothyroidism    goiter   NAFLD (nonalcoholic fatty liver disease)    Neuromuscular disorder (HCC)    sciatic nerve   PONV (postoperative nausea and vomiting)    Rectocele 01/27/2018   SVD (spontaneous vaginal delivery)    x 2    Family History  Problem Relation Age of Onset   Breast cancer Mother 42   Ovarian cancer Mother    Heart disease Father 78   Heart attack Father    Clotting disorder Father    Rheumatologic disease Father    Crohn's disease Father    Heart attack Brother    Cancer Brother    Lung cancer Brother    Colon polyps Brother    Throat cancer Brother    Colon polyps Brother    Stroke Brother    Breast cancer Maternal Aunt 60   Stroke Paternal Aunt    Heart disease Paternal Aunt    Kidney disease Paternal Uncle    Liver cancer Maternal Grandmother    Heart disease Maternal Grandfather    Lung cancer Maternal Grandfather    Irritable bowel syndrome Son    Past Surgical History:  Procedure Laterality Date   ABLATION     APPENDECTOMY     BREAST EXCISIONAL BIOPSY Left    BREAST SURGERY     left- lumpectomy   DILATATION & CURRETTAGE/HYSTEROSCOPY WITH RESECTOCOPE N/A 04/05/2012   Procedure: hysteroscopy with endocervical curretting;  Surgeon: Allyn Kenner, DO;  Location: [facility];  Service: Gynecology;  Laterality: N/A;   DILATION AND CURETTAGE OF UTERUS     EYE SURGERY Bilateral 10/2017   cataract extraction    LIVER BIOPSY  09/2018   NOSE SURGERY     VAGINAL HYSTERECTOMY  09/10/2020   Social History   Social History Narrative   She is married, has children.  They are grown.  1 son and 1 daughter has at least 1 grandchild   Former smoker no significant alcohol no drug use    Immunization History  Administered Date(s) Administered   Hepatitis A, Adult 04/20/2014, 10/25/2014   Influenza Split 11/14/2008, 03/04/2011, 03/16/2012, 11/17/2012, 12/10/2017   Influenza,inj,Quad PF,6+ Mos 12/19/2016, 01/05/2018, 11/19/2018, 02/01/2020   Influenza,inj,quad, With Preservative 03/07/2014   Influenza-Unspecified 01/26/2014, 11/24/2016, 01/23/2019   PFIZER(Purple Top)SARS-COV-2 Vaccination 04/27/2019, 05/18/2019   Pneumococcal Conjugate-13 03/07/2014   Pneumococcal Polysaccharide-23 02/01/2020   Td 04/28/2005   Tdap 10/10/2014     Objective: Vital Signs: BP (!) 147/78 (BP Location: Left Arm, Patient Position: Sitting, Cuff Size: Large)   Pulse 66   Resp 16   Ht '5\' 6"'$  (1.676 m)   Wt 215 lb 9.6 oz (97.8 kg)   BMI 34.80 kg/m    Physical Exam Vitals and nursing note reviewed.  Constitutional:      Appearance: She is well-developed.  HENT:     Head: Normocephalic and atraumatic.  Eyes:     Conjunctiva/sclera: Conjunctivae normal.  Cardiovascular:     Rate and Rhythm: Normal rate and regular rhythm.     Heart sounds: Normal heart sounds.  Pulmonary:     Effort: Pulmonary effort is normal.     Breath sounds: Normal breath sounds.  Abdominal:     General: Bowel sounds are normal.     Palpations: Abdomen is soft.  Musculoskeletal:     Cervical back: Normal range of motion.  Skin:    General: Skin is warm and dry.     Capillary Refill: Capillary refill takes less than 2 seconds.  Neurological:     Mental Status: She is alert and oriented to person, place, and time.  Psychiatric:        Behavior: Behavior normal.      Musculoskeletal Exam: Generalized hyperalgesia and positive tender points on exam.  C-spine has limited range of motion with discomfort.  She has trapezius muscle tension tenderness bilaterally.  Midline spinal tenderness in the lumbar region.  Painful range of motion of the lumbar spine.  Shoulder joints, elbow joints, wrist joints, MCPs,  PIPs, DIPs have good range of motion with no synovitis.  DIP thickening consistent with osteoarthritis of both hands.  Tenderness over the right second PIP joint.  Hip joints have good range of motion with no groin pain.  Tenderness over bilateral trochanteric bursa.  Knee joints have good range of motion with crepitus but no warmth or effusion.  Ankle joints have good range of motion with no tenderness or joint swelling.  No tenderness or synovitis of MTP joints.  CDAI Exam: CDAI Score: -- Patient Global: --; Provider Global: -- Swollen: --; Tender: -- Joint Exam 10/28/2021   No joint exam has been documented for this visit   There is currently no information documented on the homunculus. Go to the Rheumatology activity and complete the homunculus joint exam.  Investigation: No additional findings.  Imaging: No results found.  Recent Labs: Lab Results  Component Value Date   WBC 10.2 10/30/2020   HGB 12.8 10/30/2020   PLT 350.0 10/30/2020   NA 139 10/30/2020   K 4.2 10/30/2020   CL 103 10/30/2020   CO2 28 10/30/2020   GLUCOSE 96 10/30/2020   BUN 18 10/30/2020   CREATININE 0.80 10/30/2020   BILITOT 0.3 10/30/2020   ALKPHOS 70 10/30/2020   AST 20 10/30/2020   ALT 26 10/30/2020   PROT 7.3 10/30/2020   ALBUMIN 4.0 10/30/2020   CALCIUM 9.5 10/30/2020   GFRAA 83 09/08/2018    Speciality Comments: No specialty comments available.  Procedures:  No procedures performed Allergies: Amlodipine, Capsaicin, Etodolac, Sulfa antibiotics, Solifenacin, Solifenacin succinate, Bee venom, Pepto-bismol [bismuth], Pork-derived products, Rosuvastatin, and Sulfonamide derivatives   Assessment / Plan:     Visit Diagnoses: Fibromyalgia: She has generalized hyperalgesia and positive tender points on examination.  She continues to experience intermittent myalgias and muscle tenderness due to fibromyalgia.  She remains on Lyrica 25 mg at bedtime.  She is tolerating low-dose Lyrica without any side  effects.  She takes methocarbamol 500 mg 2 times daily as needed for muscle spasms during flares.  She does not require refill at this time since she has only been taking methocarbamol as needed.  Discussed the importance of regular exercise and good sleep hygiene.  She will follow-up in the office in 6 months or sooner if needed.  Primary insomnia: She experiences difficulty sleeping at night due to discomfort in her lower back.  She takes Lyrica 25 mg at bedtime.  Discussed the importance of good sleep hygiene.  Other fatigue: Chronic and secondary to insomnia.  Discussed the importance of regular exercise.  Primary osteoarthritis of both knees - Visco September/October 2022.  Patient presents today with some increased discomfort and stiffness in both knee joints.  She has good range of motion of both knee joints on examination today with crepitus.  No warmth or effusion of the knee joints noted.  She underwent Visco gel injections in both knees in September/October 2022 and noticed significant relief.  Her mobility has improved since having Visco gel injections.  Due to her clinical improvement she would like to reapply for Visco gel injections to be administered this fall.  X-rays of both knees were updated today to assess for radiographic progression.- Plan: XR KNEE 3 VIEW RIGHT, XR KNEE 3 VIEW LEFT, XR KNEE 3 VIEW LEFT, XR KNEE 3 VIEW RIGHT  This patient is diagnosed with osteoarthritis of the knee(s).    Radiographs show evidence of joint space narrowing, osteophytes, subchondral sclerosis and/or subchondral cysts.  This patient has knee pain which interferes with functional and activities of daily living.    This patient has experienced inadequate response, adverse effects and/or intolerance with conservative treatments such as acetaminophen, NSAIDS, topical creams, physical therapy or regular exercise, knee bracing and/or weight loss.   This patient has experienced inadequate response or has  a contraindication to intra articular steroid injections for at least 3 months.   This patient is not scheduled to have a total knee replacement within 6 months of starting treatment with viscosupplementation.  Pain of left femur - She has been experiencing discomfort mid thigh on the left side.  No muscular atrophy or obvious swelling or mass noted.  X-rays of the left femur were obtained today for further evaluation.  We will call her with the findings once Dr. Estanislado Pandy is back in the office. Plan: XR FEMUR MIN 2 VIEWS LEFT  Trochanteric bursitis of both hips: She has tenderness outpatient of her bilateral trochanteric bursa.  Discussed the importance of performing stretching exercises on a daily basis.  She has been getting massage on a weekly basis which has been helpful.  Primary osteoarthritis of both hands: She has DIP prominence consistent with osteoarthritic changes.  She has tenderness over the right second PIP joint but no synovitis was noted.  She was able to make a complete fist bilaterally.  Discussed the importance of joint protection and muscle strengthening.  History of bilateral carpal tunnel release: Asymptomatic at this time.  DDD (degenerative disc disease), cervical: Chronic pain.  Limited range of motion without rotation.  Trapezius muscle tension tenderness bilaterally.  Followed by Dr. Alysia Penna.   DDD (degenerative disc disease), lumbar: Chronic pain.  Followed by Dr. Alysia Penna.  Scheduled for a spinal block next week.  Chronic SI joint pain: Chronic pain   Other medical conditions are listed as follows:   Factor V deficiency ([place])  History of breast cancer  History of COPD    Orders: Orders Placed This Encounter  Procedures   XR KNEE 3 VIEW RIGHT   XR KNEE 3 VIEW LEFT   XR KNEE 3 VIEW LEFT   XR KNEE 3 VIEW RIGHT   XR FEMUR MIN 2 VIEWS LEFT   No orders of the defined types were placed in this encounter.    Follow-Up Instructions:  Return in about 6 months (around 04/28/2022) for Fibromyalgia, DDD, Osteoarthritis.   Ofilia Neas, PA-C  Note - This record  has been created using [employer].  Chart creation errors have been sought, but may not always  have been located. Such creation errors do not reflect on  the standard of medical care.

## 2021-10-17 DIAGNOSIS — M797 Fibromyalgia: Secondary | ICD-10-CM | POA: Diagnosis not present

## 2021-10-17 DIAGNOSIS — M5030 Other cervical disc degeneration, unspecified cervical region: Secondary | ICD-10-CM | POA: Diagnosis not present

## 2021-10-17 DIAGNOSIS — M5136 Other intervertebral disc degeneration, lumbar region: Secondary | ICD-10-CM | POA: Diagnosis not present

## 2021-10-17 DIAGNOSIS — M533 Sacrococcygeal disorders, not elsewhere classified: Secondary | ICD-10-CM | POA: Diagnosis not present

## 2021-10-19 ENCOUNTER — Other Ambulatory Visit: Payer: Self-pay | Admitting: Physical Medicine & Rehabilitation

## 2021-10-24 ENCOUNTER — Encounter
Payer: BC Managed Care – PPO | Attending: Physical Medicine & Rehabilitation | Admitting: Physical Medicine & Rehabilitation

## 2021-10-24 ENCOUNTER — Encounter: Payer: Self-pay | Admitting: Physical Medicine & Rehabilitation

## 2021-10-24 VITALS — BP 135/79 | HR 72 | Temp 99.5°F | Ht 66.0 in | Wt 215.4 lb

## 2021-10-24 DIAGNOSIS — M797 Fibromyalgia: Secondary | ICD-10-CM | POA: Diagnosis not present

## 2021-10-24 DIAGNOSIS — M533 Sacrococcygeal disorders, not elsewhere classified: Secondary | ICD-10-CM | POA: Diagnosis not present

## 2021-10-24 DIAGNOSIS — M5136 Other intervertebral disc degeneration, lumbar region: Secondary | ICD-10-CM | POA: Diagnosis not present

## 2021-10-24 DIAGNOSIS — M4316 Spondylolisthesis, lumbar region: Secondary | ICD-10-CM | POA: Diagnosis not present

## 2021-10-24 DIAGNOSIS — M5030 Other cervical disc degeneration, unspecified cervical region: Secondary | ICD-10-CM | POA: Diagnosis not present

## 2021-10-24 MED ORDER — LIDOCAINE HCL (PF) 1 % IJ SOLN
20.0000 mL | Freq: Once | INTRAMUSCULAR | Status: AC
  Administered 2021-10-24: 20 mL

## 2021-10-24 MED ORDER — LIDOCAINE HCL (PF) 2 % IJ SOLN
10.0000 mL | Freq: Once | INTRAMUSCULAR | Status: AC
  Administered 2021-10-24: 10 mL

## 2021-10-24 NOTE — Progress Notes (Signed)
L5 dorsal ramus S1-S2-S3 lateral branch blocks under fluoroscopic guidance Bilateral   Informed consent was obtained after describing risks and benefits of the procedure with patient these include bleeding bruising and infection.  He elects to proceed and has given written consent.  Patient placed prone on fluoroscopy table Betadine prep sterile drape a 25-gauge 1.5 inch needle was used to anesthetize skin and subcu tissue with 1% lidocaine 1 cc into each of 4 sites.  Then a 22-gauge 3.5" needle was inserted under fluoroscopic guidance for starting the S1 SAP sacral ala junction.  Bone contact made.  Omnipaque 180 x 0.5 mL demonstrated no intravascular uptake then 0.5 mL of 2% lidocaine was injected.  Then the lateral aspect of the S1, S2, S3 foramen was targeted.  Bone contact made out, Omnipaque 180 times 0.5 mL demonstrated no nerve root or intravascular uptake within 0.5 mL of 2% lidocaine solution was injected after negative drawback for blood.THis same procedure was repeated on Right side using same needle and injectate   Patient tolerated procedure well.  Postinjection instructions given.   Pre injection pain 5/10 Post injection pain 0/10  Schedule for Left SI RF next month and will do right side in November

## 2021-10-24 NOTE — Progress Notes (Unsigned)
Cardiology Office Note:    Date:  10/24/2021   ID:  [name], DOB Aug 31, 1960, MRN [id number]  PCP:  Aura Dials, MD   [facility] Providers Cardiologist:  Larae Grooms, MD { Click to update primary MD,subspecialty MD or APP then REFRESH:1}    Referring MD: Aura Dials, MD   Chief Complaint: ***  History of Present Illness:    Tracey Rodriguez is a *** 61 y.o. female with a hx of hyperlipidemia, family history of premature coronary artery disease, and obesity.   In 11/2014 she underwent carpal tunnel surgery.  After the surgery she had a squeezing feeling in both arms, several episodes occurred in December.  It happened again in early 2017.  In February 2017 she had chest pain with arm pain with presyncope and dyspnea, did not fully pass out.  She is at [employer] at that time.  She underwent a cardiac evaluation with Dr. Nadyne Coombes.  Had a monitor showing rare PVC and sinus tach that was correlated to symptoms of shortness of breath.  Nuclear stress test was negative for ischemia.  Seen by Dr. Irish Lack 10/2015 at which time she had intermittent dizziness for which she was encouraged to stay well-hydrated.  Was drinking a lot of coffee at that time and not sleeping well.  For management of hyperlipidemia, she reported difficulty affording red yeast rice.  Had itching with Crestor, memory issues with simvastatin.  He advised her to try atorvastatin but she preferred staying with red yeast rice.  In August 2020 she was referred to lipid clinic due to statin intolerance.  Metoprolol was added for BP, 2D echo showed normal LVEF with mild diastolic dysfunction, coronary CTA showed no CAD and a calcium score of 0. Lipid clinic recommended PCSK9, ezetimibe, or bempedoic acid which she declined.  She tolerated medium dose Lipitor for some time but subsequently stopped due to knee pain.  She started ezetimibe for LDL 197 in June 2022.  She was last seen in our office on 08/13/2020 by  Dr. Irish Lack.  Risk factor reduction was emphasized and she was advised to follow-up in 1 year.  Today, she is here  Past Medical History:  Diagnosis Date   Arthritis    deg disc disease  - lower back, neck   Cancer ([place])    left breast surgery-lumpectomy   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 (HCC)    Fibromyalgia    GERD (gastroesophageal reflux disease)    Heart murmur    Hx of adenomatous polyp of colon 02/04/2018   Hyperlipidemia    Hypothyroidism    goiter   NAFLD (nonalcoholic fatty liver disease)    Neuromuscular disorder (HCC)    sciatic nerve   PONV (postoperative nausea and vomiting)    Rectocele 01/27/2018   SVD (spontaneous vaginal delivery)    x 2    Past Surgical History:  Procedure Laterality Date   ABLATION     APPENDECTOMY     BREAST EXCISIONAL BIOPSY Left    BREAST SURGERY     left- lumpectomy   DILATATION & CURRETTAGE/HYSTEROSCOPY WITH RESECTOCOPE N/A 04/05/2012   Procedure: hysteroscopy with endocervical curretting;  Surgeon: Allyn Kenner, DO;  Location: [facility];  Service: Gynecology;  Laterality: N/A;   DILATION AND CURETTAGE OF UTERUS     EYE SURGERY Bilateral 10/2017   cataract extraction    LIVER BIOPSY  09/2018   NOSE SURGERY     VAGINAL HYSTERECTOMY  09/10/2020  Current Medications: No outpatient medications have been marked as taking for the 10/28/21 encounter (Appointment) with Ann Maki, Lanice Schwab, NP.     Allergies:   Amlodipine, Capsaicin, Etodolac, Sulfa antibiotics, Solifenacin, Solifenacin succinate, Bee venom, Pepto-bismol [bismuth], Pork-derived products, Rosuvastatin, and Sulfonamide derivatives   Social History   Socioeconomic History   Marital status: Married    Spouse name: Not on file   Number of children: 2   Years of education: Not on file   Highest education level: Not on file  Occupational History   Not on file  Tobacco Use   Smoking status: Former    Packs/day: 3.00    Years: 22.00    Total pack  years: 66.00    Types: Cigarettes    Quit date: 02/18/2000    Years since quitting: 21.6    Passive exposure: Never   Smokeless tobacco: Never  Vaping Use   Vaping Use: Never used  Substance and Sexual Activity   Alcohol use: Yes    Alcohol/week: 0.0 standard drinks of alcohol    Comment: RARELY   Drug use: Not Currently   Sexual activity: Yes    Birth control/protection: Post-menopausal  Other Topics Concern   Not on file  Social History Narrative   She is married, has children.  They are grown.  1 son and 1 daughter has at least 1 grandchild   Former smoker no significant alcohol no drug use   Social Determinants of Radio broadcast assistant Strain: Not on file  Food Insecurity: Not on file  Transportation Needs: Not on file  Physical Activity: Not on file  Stress: Not on file  Social Connections: Not on file     Family History: The patient's ***family history includes Breast cancer (age of onset: 65) in her maternal aunt; Breast cancer (age of onset: 50) in her mother; Cancer in her brother; Clotting disorder in her father; Colon polyps in her brother and brother; Crohn's disease in her father; Heart attack in her brother and father; Heart disease in her maternal grandfather and paternal aunt; Heart disease (age of onset: 54) in her father; Irritable bowel syndrome in her son; Kidney disease in her paternal uncle; Liver cancer in her maternal grandmother; Lung cancer in her brother and maternal grandfather; Ovarian cancer in her mother; Rheumatologic disease in her father; Stroke in her brother and paternal aunt; Throat cancer in her brother.  ROS:   Please see the history of present illness.    *** All other systems reviewed and are negative.  Labs/Other Studies Reviewed:    The following studies were reviewed today:  Echo 10/17/20   1. Left ventricular ejection fraction, by estimation, is 65 to 70%. The  left ventricle has normal function. The left ventricle has no  regional  wall motion abnormalities. Left ventricular diastolic parameters were  normal.   2. Right ventricular systolic function is normal. The right ventricular  size is normal. Tricuspid regurgitation signal is inadequate for assessing  PA pressure.   3. The mitral valve is normal in structure. No evidence of mitral valve  regurgitation. No evidence of mitral stenosis.   4. The aortic valve is normal in structure. Aortic valve regurgitation is  not visualized. No aortic stenosis is present.   5. The inferior vena cava is normal in size with <50% respiratory  variability, suggesting right atrial pressure of 8 mmHg.   CCTA 09/16/18  IMPRESSION: 1. Coronary artery calcium score 0 Agatston units. This suggests low risk  for future cardiac events.   2.  No significant coronary disease noted.  Recent Labs: 10/30/2020: ALT 26; BUN 18; Creatinine, Ser 0.80; Hemoglobin 12.8; Platelets 350.0; Potassium 4.2; Sodium 139 11/15/2020: NT-Pro BNP 22; TSH 2.590  Recent Lipid Panel No results found for: "CHOL", "TRIG", "HDL", "CHOLHDL", "VLDL", "LDLCALC", "LDLDIRECT"   Risk Assessment/Calculations:   {Does this patient have ATRIAL FIBRILLATION?:[phone]}       Physical Exam:    VS:  There were no vitals taken for this visit.    Wt Readings from Last 3 Encounters:  09/05/21 211 lb 3.2 oz (95.8 kg)  07/16/21 212 lb 9.6 oz (96.4 kg)  06/07/21 215 lb (97.5 kg)     GEN: *** Well nourished, well developed in no acute distress HEENT: Normal NECK: No JVD; No carotid bruits CARDIAC: ***RRR, no murmurs, rubs, gallops RESPIRATORY:  Clear to auscultation without rales, wheezing or rhonchi  ABDOMEN: Soft, non-tender, non-distended MUSCULOSKELETAL:  No edema; No deformity. *** pedal pulses, ***bilaterally SKIN: Warm and dry NEUROLOGIC:  Alert and oriented x 3 PSYCHIATRIC:  Normal affect   EKG:  EKG is *** ordered today.  The ekg ordered today demonstrates ***  No BP recorded.  {Refresh Note  OR Click here to enter BP  :1}***    Diagnoses:    No diagnosis found. Assessment and Plan:     Hyperlipidemia: Obesity:  {Are you ordering a CV Procedure (e.g. stress test, cath, DCCV, TEE, etc)?   Press F2        :[id number]}   Disposition:  Medication Adjustments/Labs and Tests Ordered: Current medicines are reviewed at length with the patient today.  Concerns regarding medicines are outlined above.  No orders of the defined types were placed in this encounter.  No orders of the defined types were placed in this encounter.   There are no Patient Instructions on file for this visit.   Signed, Emmaline Life, NP  10/24/2021 7:07 AM    [place]

## 2021-10-24 NOTE — Progress Notes (Signed)
Pre procedure form reviewed for upcoming sacroiliac RF on 12/12/21. She will not need a driver and is not required to hold any anticoagulants ( not on any). She is post procedure today and pre-instructions are the same as for the RF.

## 2021-10-24 NOTE — Patient Instructions (Signed)
Sacroiliac nerve block injections performed today. ? ?The injection was done under x-ray guidance with contrast enhancement. ?This procedure has been performed to help reduce low back and buttocks pain as well as potentially hip pain. ?The duration of this injection is variable lasting from hours to  weeks ?If this injection produces a >50%  short term relief of pain, then a radiofrequency ablation of these same nerves may result in a 6mo + pain relief  ?

## 2021-10-24 NOTE — Progress Notes (Signed)
  PROCEDURE RECORD [facility]   Name: Tracey Rodriguez DOB:11-26-1960 MRN: [id number]  Date:10/24/2021  Physician: Alysia Penna, MD    Nurse/CMA: Kristine Garbe R RMA  Allergies:  Allergies  Allergen Reactions   Amlodipine Itching    LEE LEE LEE   Capsaicin Anaphylaxis    Pt not sure of name    Etodolac Shortness Of Breath    REACTION: dyspnea/tightness in chest Other reaction(s): shortness of breath Other reaction(s): shortness of breath Other reaction(s): shortness of breath Other reaction(s): shortness of breath Other reaction(s): shortness of breath   Sulfa Antibiotics Nausea And Vomiting    Other reaction(s): Unknown Other reaction(s): Unknown Other reaction(s): Unknown Other reaction(s): Unknown   Solifenacin Rash    Other reaction(s): rash Other reaction(s): rash Other reaction(s): rash Other reaction(s): rash   Solifenacin Succinate Rash   Bee Venom    Pepto-Bismol [Bismuth] Nausea And Vomiting   Pork-Derived Products Nausea And Vomiting    Other reaction(s): vomiting Other reaction(s): vomiting Other reaction(s): vomiting   Rosuvastatin Itching    All statins  Other reaction(s): itching Other reaction(s): itching Other reaction(s): itching Other reaction(s): itching   Sulfonamide Derivatives Nausea And Vomiting    REACTION: vomiting    Consent Signed: Yes.    Is patient diabetic? No.  CBG today? .  Pregnant: No. LMP: No LMP recorded. Patient is postmenopausal. (age 30-55)  Anticoagulants: no Anti-inflammatory: no Antibiotics: no  Procedure: Bilateral Sacroiliac Nerve Block  Position: Prone Start Time: 10:14PM  End Time: 10:28AM  Fluoro Time: 28  RN/CMA [place] R RMA Kristine Garbe R RMA     Time 9:30am 10:28AM    BP 135/79 179/89    Pulse 68 69    Respirations 16 16    O2 Sat 98 97    S/S 6 6    Pain Level 5/10 0/10     D/C home with Self, patient A & O X 3, D/C instructions reviewed, and sits  independently.

## 2021-10-28 ENCOUNTER — Ambulatory Visit: Payer: BC Managed Care – PPO | Admitting: Nurse Practitioner

## 2021-10-28 ENCOUNTER — Encounter: Payer: Self-pay | Admitting: Physician Assistant

## 2021-10-28 ENCOUNTER — Ambulatory Visit (INDEPENDENT_AMBULATORY_CARE_PROVIDER_SITE_OTHER): Payer: BC Managed Care – PPO

## 2021-10-28 ENCOUNTER — Encounter: Payer: Self-pay | Admitting: Nurse Practitioner

## 2021-10-28 ENCOUNTER — Ambulatory Visit: Payer: BC Managed Care – PPO

## 2021-10-28 ENCOUNTER — Ambulatory Visit: Payer: BC Managed Care – PPO | Attending: Physician Assistant | Admitting: Physician Assistant

## 2021-10-28 ENCOUNTER — Telehealth: Payer: Self-pay

## 2021-10-28 VITALS — BP 147/78 | HR 66 | Resp 16 | Ht 66.0 in | Wt 215.6 lb

## 2021-10-28 VITALS — BP 120/70 | HR 72 | Ht 66.0 in | Wt 215.0 lb

## 2021-10-28 DIAGNOSIS — M533 Sacrococcygeal disorders, not elsewhere classified: Secondary | ICD-10-CM

## 2021-10-28 DIAGNOSIS — R5383 Other fatigue: Secondary | ICD-10-CM

## 2021-10-28 DIAGNOSIS — M7062 Trochanteric bursitis, left hip: Secondary | ICD-10-CM

## 2021-10-28 DIAGNOSIS — M898X5 Other specified disorders of bone, thigh: Secondary | ICD-10-CM

## 2021-10-28 DIAGNOSIS — M5030 Other cervical disc degeneration, unspecified cervical region: Secondary | ICD-10-CM

## 2021-10-28 DIAGNOSIS — I10 Essential (primary) hypertension: Secondary | ICD-10-CM | POA: Diagnosis not present

## 2021-10-28 DIAGNOSIS — M797 Fibromyalgia: Secondary | ICD-10-CM | POA: Diagnosis not present

## 2021-10-28 DIAGNOSIS — M1711 Unilateral primary osteoarthritis, right knee: Secondary | ICD-10-CM

## 2021-10-28 DIAGNOSIS — M1712 Unilateral primary osteoarthritis, left knee: Secondary | ICD-10-CM

## 2021-10-28 DIAGNOSIS — M5136 Other intervertebral disc degeneration, lumbar region: Secondary | ICD-10-CM

## 2021-10-28 DIAGNOSIS — F5101 Primary insomnia: Secondary | ICD-10-CM | POA: Diagnosis not present

## 2021-10-28 DIAGNOSIS — D682 Hereditary deficiency of other clotting factors: Secondary | ICD-10-CM

## 2021-10-28 DIAGNOSIS — Z9889 Other specified postprocedural states: Secondary | ICD-10-CM

## 2021-10-28 DIAGNOSIS — M170 Bilateral primary osteoarthritis of knee: Secondary | ICD-10-CM

## 2021-10-28 DIAGNOSIS — E782 Mixed hyperlipidemia: Secondary | ICD-10-CM | POA: Diagnosis not present

## 2021-10-28 DIAGNOSIS — R0609 Other forms of dyspnea: Secondary | ICD-10-CM

## 2021-10-28 DIAGNOSIS — Z853 Personal history of malignant neoplasm of breast: Secondary | ICD-10-CM

## 2021-10-28 DIAGNOSIS — M51369 Other intervertebral disc degeneration, lumbar region without mention of lumbar back pain or lower extremity pain: Secondary | ICD-10-CM

## 2021-10-28 DIAGNOSIS — M19042 Primary osteoarthritis, left hand: Secondary | ICD-10-CM

## 2021-10-28 DIAGNOSIS — G8929 Other chronic pain: Secondary | ICD-10-CM

## 2021-10-28 DIAGNOSIS — M19041 Primary osteoarthritis, right hand: Secondary | ICD-10-CM

## 2021-10-28 DIAGNOSIS — Z8709 Personal history of other diseases of the respiratory system: Secondary | ICD-10-CM

## 2021-10-28 DIAGNOSIS — M7061 Trochanteric bursitis, right hip: Secondary | ICD-10-CM

## 2021-10-28 NOTE — Patient Instructions (Addendum)
Medication Instructions:   Your physician recommends that you continue on your current medications as directed. Please refer to the Current Medication list given to you today.   *If you need a refill on your cardiac medications before your next appointment, please call your pharmacy*   Lab Work:  None ordered.  If you have labs (blood work) drawn today and your tests are completely normal, you will receive your results only by: [place] (if you have MyChart) OR A paper copy in the mail If you have any lab test that is abnormal or we need to change your treatment, we will call you to review the results.   Testing/Procedures:  None ordered.   Follow-Up: At [facility], you and your health needs are our priority.  As part of our continuing mission to provide you with exceptional heart care, we have created designated Provider Care Teams.  These Care Teams include your primary Cardiologist (physician) and Advanced Practice Providers (APPs -  Physician Assistants and Nurse Practitioners) who all work together to provide you with the care you need, when you need it.  We recommend signing up for the patient portal called "MyChart".  Sign up information is provided on this After Visit Summary.  MyChart is used to connect with patients for Virtual Visits (Telemedicine).  Patients are able to view lab/test results, encounter notes, upcoming appointments, etc.  Non-urgent messages can be sent to your provider as well.   To learn more about what you can do with MyChart, go to [email or web address].    Your next appointment:   1 year(s)  The format for your next appointment:   In Person  Provider:   Larae Grooms, MD     Other Instructions  You have been referred to the Lipid Clinic to see the Pharmacist.    Your physician wants you to follow-up in: 1 year with Dr. Irish Lack.  You will receive a reminder letter in the mail two months in advance. If you don't  receive a letter, please call our office to schedule the follow-up appointment.  Adopting a Healthy Lifestyle.   Weight: Know what a healthy weight is for you (roughly BMI <25) and aim to maintain this. You can calculate your body mass index on your smart phone  Diet: Aim for 7+ servings of fruits and vegetables daily Limit animal fats in diet for cholesterol and heart health - choose grass fed whenever available Avoid highly processed foods (fast food burgers, tacos, fried chicken, pizza, hot dogs, french fries)  Saturated fat comes in the form of butter, lard, coconut oil, margarine, partially hydrogenated oils, and fat in meat. These increase your risk of cardiovascular disease.  Use healthy plant oils, such as olive, canola, soy, corn, sunflower and peanut.  Whole foods such as fruits, vegetables and whole grains have fiber  Men need > 38 grams of fiber per day Women need > 25 grams of fiber per day  Load up on vegetables and fruits - one-half of your plate: Aim for color and variety, and remember that potatoes dont count. Go for whole grains - one-quarter of your plate: Whole wheat, barley, wheat berries, quinoa, oats, brown rice, and foods made with them. If you want pasta, go with whole wheat pasta. Protein power - one-quarter of your plate: Fish, chicken, beans, and nuts are all healthy, versatile protein sources. Limit red meat. You need carbohydrates for energy! The type of carbohydrate is more important than the amount. Choose carbohydrates such as  vegetables, fruits, whole grains, beans, and nuts in the place of white rice, white pasta, potatoes (baked or fried), macaroni and cheese, cakes, cookies, and donuts.  If youre thirsty, drink water. Coffee and tea are good in moderation, but skip sugary drinks and limit milk and dairy products to one or two daily servings. Keep sugar intake at 6 teaspoons or 24 grams or LESS       Exercise: Aim for 150 min of moderate intensity  exercise weekly for heart health, and weights twice weekly for bone health Stay active - any steps are better than no steps! Aim for 7-9 hours of sleep daily       Important Information About Sugar          Across a broad range of baseline cardiovascular disease (CVD) risk and LDL-C baseline levels, most therapies that lower LDL-C lead to a clinically important reduction in the risk of myocardial infarction (MI) and ischemic stroke.  While a mortality benefit has been more difficult to consistently demonstrate, intense LDL-C lowering lowers the risk of all-cause death in those at very high risk. Whatever the reason for the difficulty in consistently demonstrating a mortality benefit, we believe that most patients will accept a recommendation for LDL-C lowering in order to achieve a reduction in ischemic events (eg, MI or ischemic stroke), even in the absence of a demonstrated mortality benefit, since these events degrade quality of life in part through disability, drive up health care costs, and likely portend additional ischemic events.  A 2018 meta-analysis of 34 randomized trials (n = [phone]) comparing more with less intense LDL-C lowering for both secondary and primary prevention evaluated all-cause and cardiovascular mortality [6]. All-cause mortality was lower for more compared with less intensive therapy (rate ratio 0.92, 95% CI 0.88-0.96) but varied by baseline LDL-C level. More intensive LDL-C lowering led to a significantly lower risk of all-cause death when the baseline LDL-C was greater than 100 mg/dL (p<0.001 for the interaction

## 2021-10-28 NOTE — Telephone Encounter (Signed)
Please apply for bilateral knee visco, per Taylor Dale, PA-C. Thanks!  

## 2021-10-28 NOTE — Patient Instructions (Signed)

## 2021-10-29 NOTE — Progress Notes (Signed)
X-rays of left femur unremarkable.

## 2021-10-29 NOTE — Telephone Encounter (Signed)
VOB submitted for Orthovisc, bilateral knees ?BV pending ? ?

## 2021-10-29 NOTE — Progress Notes (Signed)
X-rays of both knees are consistent with mild osteoarthritis.  No radiographic progression noted since 2022. Please notify the patient.

## 2021-10-31 DIAGNOSIS — M797 Fibromyalgia: Secondary | ICD-10-CM | POA: Diagnosis not present

## 2021-10-31 DIAGNOSIS — M533 Sacrococcygeal disorders, not elsewhere classified: Secondary | ICD-10-CM | POA: Diagnosis not present

## 2021-10-31 DIAGNOSIS — M5030 Other cervical disc degeneration, unspecified cervical region: Secondary | ICD-10-CM | POA: Diagnosis not present

## 2021-10-31 DIAGNOSIS — M5136 Other intervertebral disc degeneration, lumbar region: Secondary | ICD-10-CM | POA: Diagnosis not present

## 2021-10-31 NOTE — Telephone Encounter (Signed)
LMOM for patient to call and schedule Orthovisc injections

## 2021-10-31 NOTE — Telephone Encounter (Signed)
Please call to schedule visco injections.  Approved for Orthovisc, bilateral knee(s). Tracey Rodriguez has been met $35 co-pay Once OOP has been met $3500 (met $2021.96) patient is covered at 100% PA approved Effective dates of authorization:  10/31/2021 - 04/28/2022 Reference [place]

## 2021-11-07 DIAGNOSIS — M797 Fibromyalgia: Secondary | ICD-10-CM | POA: Diagnosis not present

## 2021-11-07 DIAGNOSIS — M5030 Other cervical disc degeneration, unspecified cervical region: Secondary | ICD-10-CM | POA: Diagnosis not present

## 2021-11-07 DIAGNOSIS — N3281 Overactive bladder: Secondary | ICD-10-CM | POA: Diagnosis not present

## 2021-11-07 DIAGNOSIS — R351 Nocturia: Secondary | ICD-10-CM | POA: Diagnosis not present

## 2021-11-07 DIAGNOSIS — M5136 Other intervertebral disc degeneration, lumbar region: Secondary | ICD-10-CM | POA: Diagnosis not present

## 2021-11-07 DIAGNOSIS — M533 Sacrococcygeal disorders, not elsewhere classified: Secondary | ICD-10-CM | POA: Diagnosis not present

## 2021-11-12 DIAGNOSIS — M797 Fibromyalgia: Secondary | ICD-10-CM | POA: Diagnosis not present

## 2021-11-12 DIAGNOSIS — M533 Sacrococcygeal disorders, not elsewhere classified: Secondary | ICD-10-CM | POA: Diagnosis not present

## 2021-11-12 DIAGNOSIS — M5030 Other cervical disc degeneration, unspecified cervical region: Secondary | ICD-10-CM | POA: Diagnosis not present

## 2021-11-12 DIAGNOSIS — M5136 Other intervertebral disc degeneration, lumbar region: Secondary | ICD-10-CM | POA: Diagnosis not present

## 2021-11-15 ENCOUNTER — Ambulatory Visit: Payer: BC Managed Care – PPO | Attending: Physician Assistant | Admitting: Physician Assistant

## 2021-11-15 DIAGNOSIS — M170 Bilateral primary osteoarthritis of knee: Secondary | ICD-10-CM | POA: Diagnosis not present

## 2021-11-15 MED ORDER — LIDOCAINE HCL 1 % IJ SOLN
1.5000 mL | INTRAMUSCULAR | Status: AC | PRN
  Administered 2021-11-15: 1.5 mL

## 2021-11-15 MED ORDER — HYALURONAN 30 MG/2ML IX SOSY
30.0000 mg | PREFILLED_SYRINGE | INTRA_ARTICULAR | Status: AC | PRN
  Administered 2021-11-15: 30 mg via INTRA_ARTICULAR

## 2021-11-15 NOTE — Progress Notes (Signed)
   Procedure Note  Patient: MEGUMI TREASTER             Date of Birth: 09/22/1960           MRN: [id number]             Visit Date: 11/15/2021  Procedures: Visit Diagnoses:  1. Primary osteoarthritis of both knees    Orthovisc #1 bilateral knee joint injections  Large Joint Inj: bilateral knee on 11/15/2021 10:44 AM Indications: pain Details: 27 G 1.5 in needle, medial approach  Arthrogram: No  Medications (Right): 1.5 mL lidocaine 1 %; 30 mg Hyaluronan 30 MG/2ML Aspirate (Right): 0 mL Medications (Left): 1.5 mL lidocaine 1 %; 30 mg Hyaluronan 30 MG/2ML Aspirate (Left): 0 mL Outcome: tolerated well, no immediate complications Procedure, treatment alternatives, risks and benefits explained, specific risks discussed. Consent was given by the patient. Immediately prior to procedure a time out was called to verify the correct patient, procedure, equipment, support staff and site/side marked as required. Patient was prepped and draped in the usual sterile fashion.      Patient tolerated the procedure well.  Aftercare was discussed.  Hazel Sams, PA-C

## 2021-11-20 DIAGNOSIS — M797 Fibromyalgia: Secondary | ICD-10-CM | POA: Diagnosis not present

## 2021-11-20 DIAGNOSIS — M5136 Other intervertebral disc degeneration, lumbar region: Secondary | ICD-10-CM | POA: Diagnosis not present

## 2021-11-20 DIAGNOSIS — M533 Sacrococcygeal disorders, not elsewhere classified: Secondary | ICD-10-CM | POA: Diagnosis not present

## 2021-11-20 DIAGNOSIS — M5030 Other cervical disc degeneration, unspecified cervical region: Secondary | ICD-10-CM | POA: Diagnosis not present

## 2021-11-22 ENCOUNTER — Telehealth: Payer: Self-pay

## 2021-11-22 ENCOUNTER — Ambulatory Visit: Payer: BC Managed Care – PPO | Attending: Physician Assistant | Admitting: Physician Assistant

## 2021-11-22 DIAGNOSIS — M170 Bilateral primary osteoarthritis of knee: Secondary | ICD-10-CM

## 2021-11-22 DIAGNOSIS — R310 Gross hematuria: Secondary | ICD-10-CM | POA: Diagnosis not present

## 2021-11-22 MED ORDER — LIDOCAINE HCL 1 % IJ SOLN
1.5000 mL | INTRAMUSCULAR | Status: AC | PRN
  Administered 2021-11-22: 1.5 mL

## 2021-11-22 MED ORDER — HYALURONAN 30 MG/2ML IX SOSY
30.0000 mg | PREFILLED_SYRINGE | INTRA_ARTICULAR | Status: AC | PRN
  Administered 2021-11-22: 30 mg via INTRA_ARTICULAR

## 2021-11-22 NOTE — Progress Notes (Signed)
   Procedure Note  Patient: Tracey Rodriguez             Date of Birth: Nov 14, 1960           MRN: [id number]             Visit Date: 11/22/2021  Procedures: Visit Diagnoses:  1. Primary osteoarthritis of both knees    Orthovisc #2 bilateral knees, B/B Large Joint Inj: bilateral knee on 11/22/2021 8:32 AM Indications: pain Details: 25 G 1.5 in needle, medial approach  Arthrogram: No  Medications (Right): 1.5 mL lidocaine 1 %; 30 mg Hyaluronan 30 MG/2ML Aspirate (Right): 0 mL Medications (Left): 1.5 mL lidocaine 1 %; 30 mg Hyaluronan 30 MG/2ML Aspirate (Left): 0 mL Outcome: tolerated well, no immediate complications Procedure, treatment alternatives, risks and benefits explained, specific risks discussed. Consent was given by the patient. Immediately prior to procedure a time out was called to verify the correct patient, procedure, equipment, support staff and site/side marked as required. Patient was prepped and draped in the usual sterile fashion.     Patient tolerated the procedure well.  Aftercare was discussed. Hazel Sams, PA-C

## 2021-11-22 NOTE — Telephone Encounter (Signed)
Patient is calling for Diazepam for her procedure on 12/12/21.

## 2021-11-25 MED ORDER — DIAZEPAM 5 MG PO TABS: 5.0000 mg | ORAL_TABLET | Freq: Once | ORAL | 0 refills | Status: AC

## 2021-11-25 NOTE — Addendum Note (Signed)
Addended by: Charlett Blake on: 11/25/2021 01:53 PM   Modules accepted: Orders

## 2021-11-27 DIAGNOSIS — J018 Other acute sinusitis: Secondary | ICD-10-CM | POA: Diagnosis not present

## 2021-11-29 ENCOUNTER — Ambulatory Visit: Payer: BC Managed Care – PPO | Attending: Physician Assistant | Admitting: Physician Assistant

## 2021-11-29 DIAGNOSIS — M170 Bilateral primary osteoarthritis of knee: Secondary | ICD-10-CM | POA: Diagnosis not present

## 2021-11-29 MED ORDER — HYALURONAN 30 MG/2ML IX SOSY
30.0000 mg | PREFILLED_SYRINGE | INTRA_ARTICULAR | Status: AC | PRN
  Administered 2021-11-29: 30 mg via INTRA_ARTICULAR

## 2021-11-29 MED ORDER — LIDOCAINE HCL 1 % IJ SOLN
1.5000 mL | INTRAMUSCULAR | Status: AC | PRN
  Administered 2021-11-29: 1.5 mL

## 2021-11-29 NOTE — Progress Notes (Signed)
   Procedure Note  Patient: Tracey Rodriguez             Date of Birth: 1960/07/12           MRN: [id number]             Visit Date: 11/29/2021  Procedures: Visit Diagnoses:  1. Primary osteoarthritis of both knees    Orthovisc #3 Bilateral knee joint injections  Large Joint Inj: bilateral knee on 11/29/2021 8:55 AM Indications: pain Details: 27 G 1.5 in needle, medial approach  Arthrogram: No  Medications (Right): 1.5 mL lidocaine 1 %; 30 mg Hyaluronan 30 MG/2ML Aspirate (Right): 0 mL Medications (Left): 1.5 mL lidocaine 1 %; 30 mg Hyaluronan 30 MG/2ML Aspirate (Left): 0 mL Outcome: tolerated well, no immediate complications Procedure, treatment alternatives, risks and benefits explained, specific risks discussed. Consent was given by the patient. Immediately prior to procedure a time out was called to verify the correct patient, procedure, equipment, support staff and site/side marked as required. Patient was prepped and draped in the usual sterile fashion.      Patient tolerated the procedure well.  Aftercare was discussed.  Hazel Sams, PA-C

## 2021-12-10 ENCOUNTER — Ambulatory Visit: Payer: BC Managed Care – PPO

## 2021-12-11 DIAGNOSIS — R310 Gross hematuria: Secondary | ICD-10-CM | POA: Diagnosis not present

## 2021-12-12 ENCOUNTER — Encounter
Payer: BC Managed Care – PPO | Attending: Physical Medicine & Rehabilitation | Admitting: Physical Medicine & Rehabilitation

## 2021-12-12 ENCOUNTER — Encounter: Payer: Self-pay | Admitting: Physical Medicine & Rehabilitation

## 2021-12-12 VITALS — BP 139/77 | HR 69 | Ht 66.0 in | Wt 222.6 lb

## 2021-12-12 DIAGNOSIS — M4316 Spondylolisthesis, lumbar region: Secondary | ICD-10-CM | POA: Insufficient documentation

## 2021-12-12 DIAGNOSIS — M533 Sacrococcygeal disorders, not elsewhere classified: Secondary | ICD-10-CM | POA: Insufficient documentation

## 2021-12-12 MED ORDER — PREGABALIN 50 MG PO CAPS: 50.0000 mg | ORAL_CAPSULE | Freq: Every day | ORAL | 5 refills | Status: DC

## 2021-12-12 NOTE — Progress Notes (Signed)
Subjective:    Patient ID: Tracey Rodriguez, female    DOB: [birth year], 61 y.o.   MRN: [id number]  HPI 61 year old female with history of chronic low back pain.  She has additional complaints of bilateral foot numbness which occurs with either prolonged sitting or standing. The patient had good short-term relief with sacroiliac intra-articular injections which were followed by bilateral sacroiliac nerve blocks.  Despite 100% pain relief initially and good pain relief for several weeks afterwards, insurance denied preauthorization request stating that the procedure was investigational. We discussed that we have been performing these procedures in this clinic for more than 10 years and within the last 5 years Medicare has established a new CPT code for this as well. Bilateral L5, S1,2,3 blocks got ~90% pain relief for ~86month Pain Inventory Average Pain 8 Pain Right Now 7 My pain is intermittent, sharp, and aching  In the last 24 hours, has pain interfered with the following? General activity 0 Relation with others 0 Enjoyment of life 0 What TIME of day is your pain at its worst? morning  and evening Sleep (in general) Good  Pain is worse with: bending, sitting, standing, and some activites Pain improves with: therapy/exercise Relief from Meds: 0  Family History  Problem Relation Age of Onset   Breast cancer Mother 662  Ovarian cancer Mother    Heart disease Father 496  Heart attack Father    Clotting disorder Father    Rheumatologic disease Father    Crohn's disease Father    Heart attack Brother    Cancer Brother    Lung cancer Brother    Colon polyps Brother    Throat cancer Brother    Colon polyps Brother    Stroke Brother    Breast cancer Maternal Aunt 651  Stroke Paternal Aunt    Heart disease Paternal Aunt    Kidney disease Paternal Uncle    Liver cancer Maternal Grandmother    Heart disease Maternal Grandfather    Lung cancer Maternal Grandfather    Irritable  bowel syndrome Son    Social History   Socioeconomic History   Marital status: Married    Spouse name: Not on file   Number of children: 2   Years of education: Not on file   Highest education level: Not on file  Occupational History   Not on file  Tobacco Use   Smoking status: Former    Packs/day: 3.00    Years: 22.00    Total pack years: 66.00    Types: Cigarettes    Quit date: 02/18/2000    Years since quitting: 21.8    Passive exposure: Never   Smokeless tobacco: Never  Vaping Use   Vaping Use: Never used  Substance and Sexual Activity   Alcohol use: Yes    Alcohol/week: 0.0 standard drinks of alcohol    Comment: RARELY   Drug use: Not Currently   Sexual activity: Yes    Birth control/protection: Post-menopausal  Other Topics Concern   Not on file  Social History Narrative   She is married, has children.  They are grown.  1 son and 1 daughter has at least 1 grandchild   Former smoker no significant alcohol no drug use   Social Determinants of HRadio broadcast assistantStrain: Not on file  Food Insecurity: Not on file  Transportation Needs: Not on file  Physical Activity: Not on file  Stress: Not on file  Social Connections:  Not on file   Past Surgical History:  Procedure Laterality Date   ABLATION     APPENDECTOMY     BREAST EXCISIONAL BIOPSY Left    BREAST SURGERY     left- lumpectomy   DILATATION & CURRETTAGE/HYSTEROSCOPY WITH RESECTOCOPE N/A 04/05/2012   Procedure: hysteroscopy with endocervical curretting;  Surgeon: Allyn Kenner, DO;  Location: [facility];  Service: Gynecology;  Laterality: N/A;   DILATION AND CURETTAGE OF UTERUS     EYE SURGERY Bilateral 10/2017   cataract extraction    LIVER BIOPSY  09/2018   NOSE SURGERY     VAGINAL HYSTERECTOMY  09/10/2020   Past Surgical History:  Procedure Laterality Date   ABLATION     APPENDECTOMY     BREAST EXCISIONAL BIOPSY Left    BREAST SURGERY     left- lumpectomy   DILATATION &  CURRETTAGE/HYSTEROSCOPY WITH RESECTOCOPE N/A 04/05/2012   Procedure: hysteroscopy with endocervical curretting;  Surgeon: Allyn Kenner, DO;  Location: [facility];  Service: Gynecology;  Laterality: N/A;   DILATION AND CURETTAGE OF UTERUS     EYE SURGERY Bilateral 10/2017   cataract extraction    LIVER BIOPSY  09/2018   NOSE SURGERY     VAGINAL HYSTERECTOMY  09/10/2020   Past Medical History:  Diagnosis Date   Arthritis    deg disc disease  - lower back, neck   Cancer ([place])    left breast surgery-lumpectomy   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 (HCC)    Fibromyalgia    GERD (gastroesophageal reflux disease)    Heart murmur    Hx of adenomatous polyp of colon 02/04/2018   Hyperlipidemia    Hypothyroidism    goiter   NAFLD (nonalcoholic fatty liver disease)    Neuromuscular disorder (HCC)    sciatic nerve   PONV (postoperative nausea and vomiting)    Rectocele 01/27/2018   SVD (spontaneous vaginal delivery)    x 2   BP 139/77   Pulse 69   Ht '5\' 6"'$  (1.676 m)   Wt 222 lb 9.6 oz (101 kg)   SpO2 96%   BMI 35.93 kg/m   Opioid Risk Score:   Fall Risk Score:  `1  Depression screen [facility] 2/9     10/24/2021    9:29 AM 09/05/2021   11:44 AM 07/16/2021    3:04 PM 05/03/2021    9:44 AM 03/07/2021    2:35 PM 01/08/2021   10:10 AM  Depression screen PHQ 2/9  Decreased Interest 0 0 0 0 0 0  Down, Depressed, Hopeless 0 0 0 0 0 0  PHQ - 2 Score 0 0 0 0 0 0  Altered sleeping      2  Tired, decreased energy      2  Change in appetite      0  Feeling bad or failure about yourself       0  Trouble concentrating      0  Moving slowly or fidgety/restless      0  Suicidal thoughts      0  PHQ-9 Score      4  Difficult doing work/chores      Somewhat difficult     Review of Systems  Musculoskeletal:  Positive for back pain.  All other systems reviewed and are negative.     Objective:   Physical Exam Vitals and nursing note reviewed.  Constitutional:      Appearance:  She is obese.  HENT:     Head: Normocephalic and atraumatic.  Eyes:     Extraocular Movements: Extraocular movements intact.     Conjunctiva/sclera: Conjunctivae normal.     Pupils: Pupils are equal, round, and reactive to light.  Musculoskeletal:     Comments: Tenderness at bilateral PSIS area as well as lumbosacral junction bilaterally.  There is pain with forward flexion as well as with extension. No pain with hip internal/external rotation.   Skin:    General: Skin is warm and dry.  Neurological:     Mental Status: She is alert and oriented to person, place, and time.     Comments: Negative straight leg raise bilaterally Lower extremity strength is 5/5 bilateral hip flexor knee extensor ankle dorsiflexion Sensation reduced to pinprick and light touch in the bilateral S1 dermatome distribution.  Psychiatric:        Mood and Affect: Mood normal.        Behavior: Behavior normal.           Assessment & Plan:   1.  Chronic low back pain diagnostic intra-articular as well as sacroiliac blocks demonstrate excellent pain relief on a temporary basis.  Patient would be a very good candidate for bilateral sacroiliac radiofrequency ablation although this was denied by her [employer].  We discussed my experience with previous appeal from the same insurer for the same procedure.  Peer-to-peer medical director informed me that there is no exception when a procedure is deemed to be investigational. 2.  Radicular pain this is likely related to her L5-S1 spondylolisthesis with foraminal stenosis at that level her symptoms appear to be more at the S1 nerve root level.  The patient would like to try medication management first will increase her Lyrica to 50 mg.  I will see her back in 6 months.  We discussed the possibility of trying S1 transforaminal ESI if she does not get good relief with medications.  She will call if she like to schedule sooner.

## 2021-12-15 ENCOUNTER — Other Ambulatory Visit: Payer: Self-pay | Admitting: Interventional Cardiology

## 2021-12-17 DIAGNOSIS — M5136 Other intervertebral disc degeneration, lumbar region: Secondary | ICD-10-CM | POA: Diagnosis not present

## 2021-12-17 DIAGNOSIS — M797 Fibromyalgia: Secondary | ICD-10-CM | POA: Diagnosis not present

## 2021-12-17 DIAGNOSIS — M5030 Other cervical disc degeneration, unspecified cervical region: Secondary | ICD-10-CM | POA: Diagnosis not present

## 2021-12-17 DIAGNOSIS — M533 Sacrococcygeal disorders, not elsewhere classified: Secondary | ICD-10-CM | POA: Diagnosis not present

## 2021-12-18 DIAGNOSIS — M5030 Other cervical disc degeneration, unspecified cervical region: Secondary | ICD-10-CM | POA: Diagnosis not present

## 2021-12-18 DIAGNOSIS — M5136 Other intervertebral disc degeneration, lumbar region: Secondary | ICD-10-CM | POA: Diagnosis not present

## 2021-12-18 DIAGNOSIS — M797 Fibromyalgia: Secondary | ICD-10-CM | POA: Diagnosis not present

## 2021-12-18 DIAGNOSIS — M533 Sacrococcygeal disorders, not elsewhere classified: Secondary | ICD-10-CM | POA: Diagnosis not present

## 2021-12-30 DIAGNOSIS — K760 Fatty (change of) liver, not elsewhere classified: Secondary | ICD-10-CM | POA: Diagnosis not present

## 2021-12-30 DIAGNOSIS — R310 Gross hematuria: Secondary | ICD-10-CM | POA: Diagnosis not present

## 2021-12-30 DIAGNOSIS — R319 Hematuria, unspecified: Secondary | ICD-10-CM | POA: Diagnosis not present

## 2021-12-31 DIAGNOSIS — M797 Fibromyalgia: Secondary | ICD-10-CM | POA: Diagnosis not present

## 2021-12-31 DIAGNOSIS — M5136 Other intervertebral disc degeneration, lumbar region: Secondary | ICD-10-CM | POA: Diagnosis not present

## 2021-12-31 DIAGNOSIS — M5030 Other cervical disc degeneration, unspecified cervical region: Secondary | ICD-10-CM | POA: Diagnosis not present

## 2021-12-31 DIAGNOSIS — M533 Sacrococcygeal disorders, not elsewhere classified: Secondary | ICD-10-CM | POA: Diagnosis not present

## 2022-01-02 DIAGNOSIS — M797 Fibromyalgia: Secondary | ICD-10-CM | POA: Diagnosis not present

## 2022-01-02 DIAGNOSIS — M5136 Other intervertebral disc degeneration, lumbar region: Secondary | ICD-10-CM | POA: Diagnosis not present

## 2022-01-02 DIAGNOSIS — M5030 Other cervical disc degeneration, unspecified cervical region: Secondary | ICD-10-CM | POA: Diagnosis not present

## 2022-01-02 DIAGNOSIS — M533 Sacrococcygeal disorders, not elsewhere classified: Secondary | ICD-10-CM | POA: Diagnosis not present

## 2022-01-14 ENCOUNTER — Ambulatory Visit: Payer: BC Managed Care – PPO

## 2022-01-16 DIAGNOSIS — M5136 Other intervertebral disc degeneration, lumbar region: Secondary | ICD-10-CM | POA: Diagnosis not present

## 2022-01-16 DIAGNOSIS — M5030 Other cervical disc degeneration, unspecified cervical region: Secondary | ICD-10-CM | POA: Diagnosis not present

## 2022-01-16 DIAGNOSIS — M797 Fibromyalgia: Secondary | ICD-10-CM | POA: Diagnosis not present

## 2022-01-16 DIAGNOSIS — M533 Sacrococcygeal disorders, not elsewhere classified: Secondary | ICD-10-CM | POA: Diagnosis not present

## 2022-01-21 DIAGNOSIS — Z6837 Body mass index (BMI) 37.0-37.9, adult: Secondary | ICD-10-CM | POA: Diagnosis not present

## 2022-01-21 DIAGNOSIS — K760 Fatty (change of) liver, not elsewhere classified: Secondary | ICD-10-CM | POA: Diagnosis not present

## 2022-01-21 DIAGNOSIS — Z833 Family history of diabetes mellitus: Secondary | ICD-10-CM | POA: Diagnosis not present

## 2022-01-21 DIAGNOSIS — E669 Obesity, unspecified: Secondary | ICD-10-CM | POA: Diagnosis not present

## 2022-01-22 DIAGNOSIS — M5030 Other cervical disc degeneration, unspecified cervical region: Secondary | ICD-10-CM | POA: Diagnosis not present

## 2022-01-22 DIAGNOSIS — M5136 Other intervertebral disc degeneration, lumbar region: Secondary | ICD-10-CM | POA: Diagnosis not present

## 2022-01-22 DIAGNOSIS — M797 Fibromyalgia: Secondary | ICD-10-CM | POA: Diagnosis not present

## 2022-01-22 DIAGNOSIS — M533 Sacrococcygeal disorders, not elsewhere classified: Secondary | ICD-10-CM | POA: Diagnosis not present

## 2022-01-23 DIAGNOSIS — M5030 Other cervical disc degeneration, unspecified cervical region: Secondary | ICD-10-CM | POA: Diagnosis not present

## 2022-01-23 DIAGNOSIS — M797 Fibromyalgia: Secondary | ICD-10-CM | POA: Diagnosis not present

## 2022-01-23 DIAGNOSIS — M5136 Other intervertebral disc degeneration, lumbar region: Secondary | ICD-10-CM | POA: Diagnosis not present

## 2022-01-23 DIAGNOSIS — M533 Sacrococcygeal disorders, not elsewhere classified: Secondary | ICD-10-CM | POA: Diagnosis not present

## 2022-01-24 DIAGNOSIS — R310 Gross hematuria: Secondary | ICD-10-CM | POA: Diagnosis not present

## 2022-01-24 DIAGNOSIS — N3281 Overactive bladder: Secondary | ICD-10-CM | POA: Diagnosis not present

## 2022-01-28 DIAGNOSIS — M797 Fibromyalgia: Secondary | ICD-10-CM | POA: Diagnosis not present

## 2022-01-28 DIAGNOSIS — M533 Sacrococcygeal disorders, not elsewhere classified: Secondary | ICD-10-CM | POA: Diagnosis not present

## 2022-01-28 DIAGNOSIS — M5136 Other intervertebral disc degeneration, lumbar region: Secondary | ICD-10-CM | POA: Diagnosis not present

## 2022-01-28 DIAGNOSIS — M5030 Other cervical disc degeneration, unspecified cervical region: Secondary | ICD-10-CM | POA: Diagnosis not present

## 2022-02-02 DIAGNOSIS — J0140 Acute pansinusitis, unspecified: Secondary | ICD-10-CM | POA: Diagnosis not present

## 2022-02-02 DIAGNOSIS — H6993 Unspecified Eustachian tube disorder, bilateral: Secondary | ICD-10-CM | POA: Diagnosis not present

## 2022-03-10 DIAGNOSIS — K760 Fatty (change of) liver, not elsewhere classified: Secondary | ICD-10-CM | POA: Diagnosis not present

## 2022-03-10 DIAGNOSIS — K7400 Hepatic fibrosis, unspecified: Secondary | ICD-10-CM | POA: Diagnosis not present

## 2022-03-11 ENCOUNTER — Other Ambulatory Visit: Payer: Self-pay | Admitting: Internal Medicine

## 2022-03-11 DIAGNOSIS — K219 Gastro-esophageal reflux disease without esophagitis: Secondary | ICD-10-CM

## 2022-03-25 NOTE — Progress Notes (Signed)
Office Visit Note  Patient: Tracey Rodriguez             Date of Birth: April 16, 1960           MRN: [id number]             PCP: Aura Dials, MD Referring: Aura Dials, MD Visit Date: 04/08/2022 Occupation: @GUAROCC$ @  Subjective:  medication management   History of Present Illness: Tracey Rodriguez is a 62 y.o. female with history of fibromyalgia, lumbar and cervical DDD and osteoarthritis. Today, she complains of bilateral lateral ankle and feet pain that has persisted for several years and has gotten worse over the past 6 months. She has tried inserts for her shoes. They have not helped her ankle pain but they have improved her plantar fasciitis. She takes aleve as needed. She takes Lyrica 25 mg every night and robaxin 500 mg BID PRN for cervical and lumbar DDD. She follows up with Dr. Alysia Penna for her back.  Patient states the cortisone injection to her back relieved her lower back pain for few weeks but after that she did some yard work and the pain returned.  She has history of insomnia and the lyrica is helping manage the back and hip pain so she can sleep better. She has history of osteoarthritis of the knees and the visco injections are still helping. She has some pain and swelling in her finger and wrists joints bilaterally. She has fibromyalgia and was following up with integrative therapy.  She continues to have some trapezius and gluteal discomfort.  Integrative therapy was very helpful and she plans to return for more sessions.   Activities of Daily Living:  Patient reports morning stiffness for 1-2 hours.   Patient Reports nocturnal pain.  Difficulty dressing/grooming: Reports Difficulty climbing stairs: Reports Difficulty getting out of chair: Denies Difficulty using hands for taps, buttons, cutlery, and/or writing: Reports  Review of Systems  Constitutional: Negative.  Negative for fatigue.  HENT: Negative.  Negative for mouth sores and mouth dryness.    Eyes:  Positive for dryness.  Respiratory:  Positive for shortness of breath.   Cardiovascular: Negative.  Negative for chest pain and palpitations.  Gastrointestinal: Negative.  Negative for blood in stool, constipation and diarrhea.  Endocrine: Positive for increased urination.  Genitourinary:  Positive for involuntary urination.  Musculoskeletal:  Positive for joint pain, joint pain, joint swelling, muscle weakness, morning stiffness and muscle tenderness. Negative for gait problem, myalgias and myalgias.  Skin:  Positive for hair loss. Negative for color change, rash and sensitivity to sunlight.  Allergic/Immunologic: Positive for susceptible to infections.  Neurological: Negative.  Negative for dizziness and headaches.  Hematological: Negative.  Negative for swollen glands.  Psychiatric/Behavioral: Negative.  Negative for depressed mood and sleep disturbance. The patient is not nervous/anxious.     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   Vocal cord dysfunction 05/21/2020   Chronic cough 04/19/2020   Sensorineural hearing loss (SNHL), bilateral 04/04/2020   Osteopenia 12/01/2019   NAFLD (nonalcoholic fatty liver disease) 09/29/2019   Obesity (BMI 30.0-34.9) 09/29/2019   Hyperlipidemia 09/09/2018   Prediabetes 09/09/2018   Hx of adenomatous polyp of colon 02/04/2018   Rectocele 01/27/2018   Change in bowel habits 01/05/2018   Hemorrhoids 01/05/2018   Hoarseness 07/30/2017   Gastroesophageal reflux disease 07/30/2017   Pure hypercholesterolemia 06/03/2017  History of breast cancer 09/11/2016   Other fatigue 03/15/2016   Primary insomnia 03/15/2016   Primary osteoarthritis of both hands 03/15/2016   DDD cervical spine 03/15/2016   Osteoarthritis of lumbar spine 03/15/2016   History of bilateral carpal tunnel release 03/15/2016   Unspecified  hypothyroidism 03/30/2013   Fibromyalgia 03/30/2013   Breast cancer ([place]) 03/30/2013   Factor V deficiency ([place]) 03/30/2013   Dyspnea 02/02/2013   Restrictive lung disease 02/02/2013    Past Medical History:  Diagnosis Date   Arthritis    deg disc disease  - lower back, neck   Cancer ([place])    left breast surgery-lumpectomy   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 ([place])    Fibromyalgia    GERD (gastroesophageal reflux disease)    Heart murmur    Hx of adenomatous polyp of colon 02/04/2018   Hyperlipidemia    Hypothyroidism    goiter   NAFLD (nonalcoholic fatty liver disease)    Neuromuscular disorder (HCC)    sciatic nerve   PONV (postoperative nausea and vomiting)    Rectocele 01/27/2018   SVD (spontaneous vaginal delivery)    x 2    Family History  Problem Relation Age of Onset   Breast cancer Mother 38   Ovarian cancer Mother    Heart disease Father 12   Heart attack Father    Clotting disorder Father    Rheumatologic disease Father    Crohn's disease Father    Heart attack Brother    Cancer Brother    Lung cancer Brother    Colon polyps Brother    Throat cancer Brother    Colon polyps Brother    Stroke Brother    Breast cancer Maternal Aunt 60   Stroke Paternal Aunt    Heart disease Paternal Aunt    Kidney disease Paternal Uncle    Liver cancer Maternal Grandmother    Heart disease Maternal Grandfather    Lung cancer Maternal Grandfather    Irritable bowel syndrome Son    Past Surgical History:  Procedure Laterality Date   ABLATION     APPENDECTOMY     BREAST EXCISIONAL BIOPSY Left    BREAST SURGERY     left- lumpectomy   DILATATION & CURRETTAGE/HYSTEROSCOPY WITH RESECTOCOPE N/A 04/05/2012   Procedure: hysteroscopy with endocervical curretting;  Surgeon: Allyn Kenner, DO;  Location: [facility];  Service: Gynecology;  Laterality: N/A;   DILATION AND CURETTAGE OF UTERUS     EYE SURGERY Bilateral 10/2017   cataract extraction    LIVER BIOPSY   09/2018   NOSE SURGERY     VAGINAL HYSTERECTOMY  09/10/2020   Social History   Social History Narrative   She is married, has children.  They are grown.  1 son and 1 daughter has at least 1 grandchild   Former smoker no significant alcohol no drug use   Immunization History  Administered Date(s) Administered   Hepatitis A, Adult 04/20/2014, 10/25/2014   Influenza Split 11/14/2008, 03/04/2011, 03/16/2012, 11/17/2012, 12/10/2017   Influenza,inj,Quad PF,6+ Mos 12/19/2016, 01/05/2018, 11/19/2018, 02/01/2020   Influenza,inj,quad, With Preservative 03/07/2014   Influenza-Unspecified 01/26/2014, 11/24/2016, 01/23/2019   PFIZER(Purple Top)SARS-COV-2 Vaccination 04/27/2019, 05/18/2019   Pneumococcal Conjugate-13 03/07/2014   Pneumococcal Polysaccharide-23 02/01/2020   Td 04/28/2005   Tdap 10/10/2014     Objective: Vital Signs: BP (!) 146/82 (BP Location: Left Arm, Patient Position: Sitting, Cuff Size: Large)   Pulse 71   Resp 16   Ht 5' 6"$  (  1.676 m)   Wt 233 lb (105.7 kg)   BMI 37.61 kg/m    Physical Exam Vitals and nursing note reviewed.  Constitutional:      Appearance: She is well-developed.  HENT:     Head: Normocephalic and atraumatic.  Eyes:     Conjunctiva/sclera: Conjunctivae normal.  Cardiovascular:     Rate and Rhythm: Normal rate and regular rhythm.     Heart sounds: Normal heart sounds.  Pulmonary:     Effort: Pulmonary effort is normal.     Breath sounds: Normal breath sounds.  Abdominal:     General: Bowel sounds are normal.     Palpations: Abdomen is soft.  Musculoskeletal:     Cervical back: Decreased range of motion.  Lymphadenopathy:     Cervical: No cervical adenopathy.  Skin:    General: Skin is warm and dry.     Capillary Refill: Capillary refill takes less than 2 seconds.  Neurological:     Mental Status: She is alert and oriented to person, place, and time.  Psychiatric:        Behavior: Behavior normal.      Musculoskeletal Exam:  Generalized hyperalgesia and positive tender points on exam. C-spine has limited range of motion with discomfort. Tenderness over the bilateral trapezius region and SI joints. Painful range of motion of the lumbar spine. Difficulty ambulating to the table due to lower back pain. Shoulder joints, elbow joints, wrist joints, MCPs, PIPs, DIPs have good range of motion with no synovitis.  PIP and DIP thickening of both hands. Hip joints have good range of motion. Tenderness over the left trochanteric bursa. Knee joints have good range of motion with no warmth or effusion. Ankle joints have good range of motion.  She had callus formation on the lateral aspect of her feet and under the metatarsals.  CDAI Exam: CDAI Score: -- Patient Global: --; Provider Global: -- Swollen: --; Tender: -- Joint Exam 04/08/2022   No joint exam has been documented for this visit   There is currently no information documented on the homunculus. Go to the Rheumatology activity and complete the homunculus joint exam.  Investigation: No additional findings.  Imaging: No results found.  Recent Labs: Lab Results  Component Value Date   WBC 10.2 10/30/2020   HGB 12.8 10/30/2020   PLT 350.0 10/30/2020   NA 139 10/30/2020   K 4.2 10/30/2020   CL 103 10/30/2020   CO2 28 10/30/2020   GLUCOSE 96 10/30/2020   BUN 18 10/30/2020   CREATININE 0.80 10/30/2020   BILITOT 0.3 10/30/2020   ALKPHOS 70 10/30/2020   AST 20 10/30/2020   ALT 26 10/30/2020   PROT 7.3 10/30/2020   ALBUMIN 4.0 10/30/2020   CALCIUM 9.5 10/30/2020   GFRAA 83 09/08/2018    Speciality Comments: No specialty comments available.  Procedures:  No procedures performed Allergies: Amlodipine, Capsaicin, Etodolac, Sulfa antibiotics, Solifenacin, Solifenacin succinate, Bee venom, Pepto-bismol [bismuth], Pork-derived products, Rosuvastatin, and Sulfonamide derivatives   Assessment / Plan:     Visit Diagnoses: Fibromyalgia - She has generalized  hyperalgesia and positive tender points on examination. She continues to experience intermittent myalgias and muscle tenderness due to fibromyalgia. She remains on Lyrica 25 mg at bedtime. She takes methocarbamol 500 mg 2 times daily as needed for muscle spasms during flares. Discussed the importance of exercise and good sleep hygiene. She was receiving therapy at integrative therapies and found this extremely beneficial. She plans to schedule more sessions with them. She  will let our office know if she needs a new referral.    Pain in both feet - Patient has lateral feet and ankle pain bilaterally. There were calluses on both feet and her shoes were significant for more wear on the lateral aspect. Suggested she returns to the shoe store to have new shoes built to better support this distribution.  Exercises to develop feet muscles were advised.  Other fatigue: Chronic and secondary to insomnia. Discussed the importance of sleep hygiene and exercise.  Primary insomnia - Secondary to bilateral hip pain. She takes Lyrica 25 mg at bedtime which helps some. Discussed the importance of sleep hygiene.   Primary osteoarthritis of both knees - Orthovisc bilateral knees 10/2021-11/2021. Still getting some relief from these injections.   Pain of left femur - left femur pain has resolved  Trochanteric bursitis of both hips - Tenderness to palpation over the L. Trochanteric bursa. Discussed IT band exercises. Provided in AVS.   Primary osteoarthritis of both hands - Patient complains of hand and wrist pain. No evidence of inflammation on examination.  Joint protection muscle strengthening was discussed.  Provided hand exercises in AVS.   DDD (degenerative disc disease), cervical -chronic discomfort.  She had bilateral trapezius spasm.  Continues tot take Lyrica 25 mg QHS and methocarbomol 500 mg BID PRN for muscle spasms. Followed by Dr. Alysia Penna.   DDD (degenerative disc disease), lumbar -she had  relief from cortisone injection but the pain returned after doing the yard work.  She continues to take Lyrica 25 mg QHS and methocarbomol 500 mg BID PRN for muscle spasms. Followed by Dr. Alysia Penna.  A handout on back exercises was given.  Core strengthening was discussed.  Chronic SI joint pain - tenderness to palpation on exam. Followed by Dr. Alysia Penna.  Factor V deficiency ([place])  History of breast cancer  History of COPD  History of bilateral carpal tunnel release   Orders: No orders of the defined types were placed in this encounter.  No orders of the defined types were placed in this encounter.   Face-to-face time spent with patient was 30 minutes. Greater than 50% of time was spent in counseling and coordination of care.  Follow-Up Instructions: Return in about 6 months (around 10/07/2022) for OA, FMS.   Bo Merino, MD  Note - This record has been created using Editor, commissioning.  Chart creation errors have been sought, but may not always  have been located. Such creation errors do not reflect on  the standard of medical care.

## 2022-04-08 ENCOUNTER — Encounter: Payer: Self-pay | Admitting: Rheumatology

## 2022-04-08 ENCOUNTER — Ambulatory Visit: Payer: BC Managed Care – PPO | Attending: Rheumatology | Admitting: Rheumatology

## 2022-04-08 VITALS — BP 146/82 | HR 71 | Resp 16 | Ht 66.0 in | Wt 233.0 lb

## 2022-04-08 DIAGNOSIS — M797 Fibromyalgia: Secondary | ICD-10-CM | POA: Diagnosis not present

## 2022-04-08 DIAGNOSIS — M5030 Other cervical disc degeneration, unspecified cervical region: Secondary | ICD-10-CM | POA: Diagnosis not present

## 2022-04-08 DIAGNOSIS — Z853 Personal history of malignant neoplasm of breast: Secondary | ICD-10-CM

## 2022-04-08 DIAGNOSIS — M170 Bilateral primary osteoarthritis of knee: Secondary | ICD-10-CM | POA: Diagnosis not present

## 2022-04-08 DIAGNOSIS — R5383 Other fatigue: Secondary | ICD-10-CM

## 2022-04-08 DIAGNOSIS — F5101 Primary insomnia: Secondary | ICD-10-CM | POA: Diagnosis not present

## 2022-04-08 DIAGNOSIS — G8929 Other chronic pain: Secondary | ICD-10-CM

## 2022-04-08 DIAGNOSIS — Z8709 Personal history of other diseases of the respiratory system: Secondary | ICD-10-CM

## 2022-04-08 DIAGNOSIS — D682 Hereditary deficiency of other clotting factors: Secondary | ICD-10-CM

## 2022-04-08 DIAGNOSIS — M533 Sacrococcygeal disorders, not elsewhere classified: Secondary | ICD-10-CM

## 2022-04-08 DIAGNOSIS — Z9889 Other specified postprocedural states: Secondary | ICD-10-CM

## 2022-04-08 DIAGNOSIS — M19041 Primary osteoarthritis, right hand: Secondary | ICD-10-CM

## 2022-04-08 DIAGNOSIS — M19042 Primary osteoarthritis, left hand: Secondary | ICD-10-CM

## 2022-04-08 DIAGNOSIS — M5136 Other intervertebral disc degeneration, lumbar region: Secondary | ICD-10-CM | POA: Diagnosis not present

## 2022-04-08 DIAGNOSIS — M7061 Trochanteric bursitis, right hip: Secondary | ICD-10-CM

## 2022-04-08 DIAGNOSIS — M898X5 Other specified disorders of bone, thigh: Secondary | ICD-10-CM

## 2022-04-08 DIAGNOSIS — M79672 Pain in left foot: Secondary | ICD-10-CM | POA: Insufficient documentation

## 2022-04-08 DIAGNOSIS — M7062 Trochanteric bursitis, left hip: Secondary | ICD-10-CM

## 2022-04-08 DIAGNOSIS — M79671 Pain in right foot: Secondary | ICD-10-CM

## 2022-04-08 NOTE — Patient Instructions (Addendum)
Iliotibial Band Syndrome Rehab Ask your health care provider which exercises are safe for you. Do exercises exactly as told by your health care provider and adjust them as directed. It is normal to feel mild stretching, pulling, tightness, or discomfort as you do these exercises. Stop right away if you feel sudden pain or your pain gets significantly worse. Do not begin these exercises until told by your health care provider. Stretching and range-of-motion exercises These exercises warm up your muscles and joints and improve the movement and flexibility of your hip and pelvis. Quadriceps stretch, prone  Lie on your abdomen (prone position) on a firm surface, such as a bed or padded floor. Bend your left / right knee and reach back to hold your ankle or pant leg. If you cannot reach your ankle or pant leg, loop a belt around your foot and grab the belt instead. Gently pull your heel toward your buttocks. Your knee should not slide out to the side. You should feel a stretch in the front of your thigh and knee (quadriceps). Hold this position for __________ seconds. Repeat __________ times. Complete this exercise __________ times a day. Iliotibial band stretch An iliotibial band is a strong band of muscle tissue that runs from the outer side of your hip to the outer side of your thigh and knee. Lie on your side with your left / right leg in the top position. Bend both of your knees and grab your left / right ankle. Stretch out your bottom arm to help you balance. Slowly bring your top knee back so your thigh goes behind your trunk. Slowly lower your top leg toward the floor until you feel a gentle stretch on the outside of your left / right hip and thigh. If you do not feel a stretch and your knee will not fall farther, place the heel of your other foot on top of your knee and pull your knee down toward the floor with your foot. Hold this position for __________ seconds. Repeat __________ times.  Complete this exercise __________ times a day. Strengthening exercises These exercises build strength and endurance in your hip and pelvis. Endurance is the ability to use your muscles for a long time, even after they get tired. Straight leg raises, side-lying This exercise strengthens the muscles that rotate the leg at the hip and move it away from your body (hip abductors). Lie on your side with your left / right leg in the top position. Lie so your head, shoulder, hip, and knee line up. You may bend your bottom knee to help you balance. Roll your hips slightly forward so your hips are stacked directly over each other and your left / right knee is facing forward. Tense the muscles in your outer thigh and lift your top leg 4-6 inches (10-15 cm). Hold this position for __________ seconds. Slowly lower your leg to return to the starting position. Let your muscles relax completely before doing another repetition. Repeat __________ times. Complete this exercise __________ times a day. Leg raises, prone This exercise strengthens the muscles that move the hips backward (hip extensors). Lie on your abdomen (prone position) on your bed or a firm surface. You can put a pillow under your hips if that is more comfortable for your lower back. Bend your left / right knee so your foot is straight up in the air. Squeeze your buttocks muscles and lift your left / right thigh off the bed. Do not let your back arch. Tense   your thigh muscle as hard as you can without increasing any knee pain. Hold this position for __________ seconds. Slowly lower your leg to return to the starting position and allow it to relax completely. Repeat __________ times. Complete this exercise __________ times a day. Hip hike Stand sideways on a bottom step. Stand on your left / right leg with your other foot unsupported next to the step. You can hold on to a railing or wall for balance if needed. Keep your knees straight and your  torso square. Then lift your left / right hip up toward the ceiling. Slowly let your left / right hip lower toward the floor, past the starting position. Your foot should get closer to the floor. Do not lean or bend your knees. Repeat __________ times. Complete this exercise __________ times a day. This information is not intended to replace advice given to you by your health care provider. Make sure you discuss any questions you have with your health care provider. Document Revised: 04/13/2019 Document Reviewed: 04/13/2019 Elsevier Patient Education  [place].  Back Exercises The following exercises strengthen the muscles that help to support the trunk (torso) and back. They also help to keep the lower back flexible. Doing these exercises can help to prevent or lessen existing low back pain. If you have back pain or discomfort, try doing these exercises 2-3 times each day or as told by your health care provider. As your pain improves, do them once each day, but increase the number of times that you repeat the steps for each exercise (do more repetitions). To prevent the recurrence of back pain, continue to do these exercises once each day or as told by your health care provider. Do exercises exactly as told by your health care provider and adjust them as directed. It is normal to feel mild stretching, pulling, tightness, or discomfort as you do these exercises, but you should stop right away if you feel sudden pain or your pain gets worse. Exercises Single knee to chest Repeat these steps 3-5 times for each leg: Lie on your back on a firm bed or the floor with your legs extended. Bring one knee to your chest. Your other leg should stay extended and in contact with the floor. Hold your knee in place by grabbing your knee or thigh with both hands and hold. Pull on your knee until you feel a gentle stretch in your lower back or buttocks. Hold the stretch for 10-30 seconds. Slowly release  and straighten your leg.  Pelvic tilt Repeat these steps 5-10 times: Lie on your back on a firm bed or the floor with your legs extended. Bend your knees so they are pointing toward the ceiling and your feet are flat on the floor. Tighten your lower abdominal muscles to press your lower back against the floor. This motion will tilt your pelvis so your tailbone points up toward the ceiling instead of pointing to your feet or the floor. With gentle tension and even breathing, hold this position for 5-10 seconds.  Cat-cow Repeat these steps until your lower back becomes more flexible: Get into a hands-and-knees position on a firm bed or the floor. Keep your hands under your shoulders, and keep your knees under your hips. You may place padding under your knees for comfort. Let your head hang down toward your chest. Contract your abdominal muscles and point your tailbone toward the floor so your lower back becomes rounded like the back of a cat.  Hold this position for 5 seconds. Slowly lift your head, let your abdominal muscles relax, and point your tailbone up toward the ceiling so your back forms a sagging arch like the back of a cow. Hold this position for 5 seconds.  Press-ups Repeat these steps 5-10 times: Lie on your abdomen (face-down) on a firm bed or the floor. Place your palms near your head, about shoulder-width apart. Keeping your back as relaxed as possible and keeping your hips on the floor, slowly straighten your arms to raise the top half of your body and lift your shoulders. Do not use your back muscles to raise your upper torso. You may adjust the placement of your hands to make yourself more comfortable. Hold this position for 5 seconds while you keep your back relaxed. Slowly return to lying flat on the floor.  Bridges Repeat these steps 10 times: Lie on your back on a firm bed or the floor. Bend your knees so they are pointing toward the ceiling and your feet are flat on  the floor. Your arms should be flat at your sides, next to your body. Tighten your buttocks muscles and lift your buttocks off the floor until your waist is at almost the same height as your knees. You should feel the muscles working in your buttocks and the back of your thighs. If you do not feel these muscles, slide your feet 1-2 inches (2.5-5 cm) farther away from your buttocks. Hold this position for 3-5 seconds. Slowly lower your hips to the starting position, and allow your buttocks muscles to relax completely. If this exercise is too easy, try doing it with your arms crossed over your chest. Abdominal crunches Repeat these steps 5-10 times: Lie on your back on a firm bed or the floor with your legs extended. Bend your knees so they are pointing toward the ceiling and your feet are flat on the floor. Cross your arms over your chest. Tip your chin slightly toward your chest without bending your neck. Tighten your abdominal muscles and slowly raise your torso high enough to lift your shoulder blades a tiny bit off the floor. Avoid raising your torso higher than that because it can put too much stress on your lower back and does not help to strengthen your abdominal muscles. Slowly return to your starting position.  Back lifts Repeat these steps 5-10 times: Lie on your abdomen (face-down) with your arms at your sides, and rest your forehead on the floor. Tighten the muscles in your legs and your buttocks. Slowly lift your chest off the floor while you keep your hips pressed to the floor. Keep the back of your head in line with the curve in your back. Your eyes should be looking at the floor. Hold this position for 3-5 seconds. Slowly return to your starting position.  Contact a health care provider if: Your back pain or discomfort gets much worse when you do an exercise. Your worsening back pain or discomfort does not lessen within 2 hours after you exercise. If you have any of these  problems, stop doing these exercises right away. Do not do them again unless your health care provider says that you can. Get help right away if: You develop sudden, severe back pain. If this happens, stop doing the exercises right away. Do not do them again unless your health care provider says that you can. This information is not intended to replace advice given to you by your health care provider. Make sure  you discuss any questions you have with your health care provider. Document Revised: 07/31/2020 Document Reviewed: 04/18/2020 Elsevier Patient Education  [place]. Hand Exercises Hand exercises can be helpful for almost anyone. These exercises can strengthen the hands, improve flexibility and movement, and increase blood flow to the hands. These results can make work and daily tasks easier. Hand exercises can be especially helpful for people who have joint pain from arthritis or have nerve damage from overuse (carpal tunnel syndrome). These exercises can also help people who have injured a hand. Exercises Most of these hand exercises are gentle stretching and motion exercises. It is usually safe to do them often throughout the day. Warming up your hands before exercise may help to reduce stiffness. You can do this with gentle massage or by placing your hands in warm water for 10-15 minutes. It is normal to feel some stretching, pulling, tightness, or mild discomfort as you begin new exercises. This will gradually improve. Stop an exercise right away if you feel sudden, severe pain or your pain gets worse. Ask your health care provider which exercises are best for you. Knuckle bend or "claw" fist  Stand or sit with your arm, hand, and all five fingers pointed straight up. Make sure to keep your wrist straight during the exercise. Gently bend your fingers down toward your palm until the tips of your fingers are touching the top of your palm. Keep your big knuckle straight and just bend  the small knuckles in your fingers. Hold this position for __________ seconds. Straighten (extend) your fingers back to the starting position. Repeat this exercise 5-10 times with each hand. Full finger fist  Stand or sit with your arm, hand, and all five fingers pointed straight up. Make sure to keep your wrist straight during the exercise. Gently bend your fingers into your palm until the tips of your fingers are touching the middle of your palm. Hold this position for __________ seconds. Extend your fingers back to the starting position, stretching every joint fully. Repeat this exercise 5-10 times with each hand. Straight fist Stand or sit with your arm, hand, and all five fingers pointed straight up. Make sure to keep your wrist straight during the exercise. Gently bend your fingers at the big knuckle, where your fingers meet your hand, and the middle knuckle. Keep the knuckle at the tips of your fingers straight and try to touch the bottom of your palm. Hold this position for __________ seconds. Extend your fingers back to the starting position, stretching every joint fully. Repeat this exercise 5-10 times with each hand. Tabletop  Stand or sit with your arm, hand, and all five fingers pointed straight up. Make sure to keep your wrist straight during the exercise. Gently bend your fingers at the big knuckle, where your fingers meet your hand, as far down as you can while keeping the small knuckles in your fingers straight. Think of forming a tabletop with your fingers. Hold this position for __________ seconds. Extend your fingers back to the starting position, stretching every joint fully. Repeat this exercise 5-10 times with each hand. Finger spread  Place your hand flat on a table with your palm facing down. Make sure your wrist stays straight as you do this exercise. Spread your fingers and thumb apart from each other as far as you can until you feel a gentle stretch. Hold this  position for __________ seconds. Bring your fingers and thumb tight together again. Hold this position for  __________ seconds. Repeat this exercise 5-10 times with each hand. Making circles  Stand or sit with your arm, hand, and all five fingers pointed straight up. Make sure to keep your wrist straight during the exercise. Make a circle by touching the tip of your thumb to the tip of your index finger. Hold for __________ seconds. Then open your hand wide. Repeat this motion with your thumb and each finger on your hand. Repeat this exercise 5-10 times with each hand. Thumb motion  Sit with your forearm resting on a table and your wrist straight. Your thumb should be facing up toward the ceiling. Keep your fingers relaxed as you move your thumb. Lift your thumb up as high as you can toward the ceiling. Hold for __________ seconds. Bend your thumb across your palm as far as you can, reaching the tip of your thumb for the small finger (pinkie) side of your palm. Hold for __________ seconds. Repeat this exercise 5-10 times with each hand. Grip strengthening  Hold a stress ball or other soft ball in the middle of your hand. Slowly increase the pressure, squeezing the ball as much as you can without causing pain. Think of bringing the tips of your fingers into the middle of your palm. All of your finger joints should bend when doing this exercise. Hold your squeeze for __________ seconds, then relax. Repeat this exercise 5-10 times with each hand. Contact a health care provider if: Your hand pain or discomfort gets much worse when you do an exercise. Your hand pain or discomfort does not improve within 2 hours after you exercise. If you have any of these problems, stop doing these exercises right away. Do not do them again unless your health care provider says that you can. Get help right away if: You develop sudden, severe hand pain or swelling. If this happens, stop doing these exercises  right away. Do not do them again unless your health care provider says that you can. This information is not intended to replace advice given to you by your health care provider. Make sure you discuss any questions you have with your health care provider. Document Revised: 05/24/2020 Document Reviewed: 05/24/2020 Elsevier Patient Education  [place].

## 2022-04-10 DIAGNOSIS — M5030 Other cervical disc degeneration, unspecified cervical region: Secondary | ICD-10-CM | POA: Diagnosis not present

## 2022-04-10 DIAGNOSIS — M5136 Other intervertebral disc degeneration, lumbar region: Secondary | ICD-10-CM | POA: Diagnosis not present

## 2022-04-10 DIAGNOSIS — M797 Fibromyalgia: Secondary | ICD-10-CM | POA: Diagnosis not present

## 2022-04-10 DIAGNOSIS — M533 Sacrococcygeal disorders, not elsewhere classified: Secondary | ICD-10-CM | POA: Diagnosis not present

## 2022-04-19 DIAGNOSIS — R1032 Left lower quadrant pain: Secondary | ICD-10-CM | POA: Diagnosis not present

## 2022-04-19 DIAGNOSIS — K625 Hemorrhage of anus and rectum: Secondary | ICD-10-CM | POA: Diagnosis not present

## 2022-04-19 DIAGNOSIS — R1031 Right lower quadrant pain: Secondary | ICD-10-CM | POA: Diagnosis not present

## 2022-04-22 ENCOUNTER — Telehealth: Payer: Self-pay | Admitting: Internal Medicine

## 2022-04-22 NOTE — Telephone Encounter (Signed)
Left message for pt to call back  °

## 2022-04-22 NOTE — Telephone Encounter (Signed)
Inbound call from patient wanting to speak with a nurse in regards to having rectal bleeding. Please advise.  Thank you

## 2022-04-22 NOTE — Telephone Encounter (Signed)
Pt stated that she started having rectal bleeding (Bright red blood) on Thursday , Friday , and Saturday. No stool just rectal bleeding. Cramping and lower abdominal pain, felt like contractions: Went to urgent care on Saturday. Rectal exam done that showed a GI bleed.  Sunday pt had BM with blood mixed in. Pt stated that yesterday and today that the blood has lessened a lot along with the cramps are a lot better today. Pt requesting office visit with Dr. Carlean Purl. Pt was scheduled for an office visit on 04/29/2022 at 3:10 to see Dr. Carlean Purl. Pt made aware. Pt made aware if she has any shortness of breath, dizziness, lightlessness, or chest pain to go to the ED. Pt made aware if her bleeding or abdominal cramps  increase to please make [place] aware.  Pt verbalized understanding with all questions answered.

## 2022-04-23 ENCOUNTER — Telehealth: Payer: Self-pay | Admitting: Internal Medicine

## 2022-04-23 DIAGNOSIS — M797 Fibromyalgia: Secondary | ICD-10-CM | POA: Diagnosis not present

## 2022-04-23 DIAGNOSIS — M5136 Other intervertebral disc degeneration, lumbar region: Secondary | ICD-10-CM | POA: Diagnosis not present

## 2022-04-23 DIAGNOSIS — M5030 Other cervical disc degeneration, unspecified cervical region: Secondary | ICD-10-CM | POA: Diagnosis not present

## 2022-04-23 DIAGNOSIS — M533 Sacrococcygeal disorders, not elsewhere classified: Secondary | ICD-10-CM | POA: Diagnosis not present

## 2022-04-23 MED ORDER — DICYCLOMINE HCL 20 MG PO TABS: 20.0000 mg | ORAL_TABLET | Freq: Four times a day (QID) | ORAL | 0 refills | Status: DC | PRN

## 2022-04-23 MED ORDER — DICYCLOMINE HCL 20 MG PO TABS: 20.0000 mg | ORAL_TABLET | Freq: Three times a day (TID) | ORAL | 0 refills | Status: DC

## 2022-04-23 NOTE — Telephone Encounter (Signed)
Agree w/ recommendations She was seen at [facility] walk-in clinic I can tell but no notes visible in care everywhere

## 2022-04-23 NOTE — Telephone Encounter (Signed)
Patient had intense abdominal cramps late last week and weekend and then had rectal bleeding/hematochezia. Bleeding has stopped. She has appt me 3/12 - see other phone note.  Seen at [facility] walk in and Cipro rx offered. No CBC  Abd cramps persist but much less.  I wonder if she has not had an episode of non-occlusive ischemic colitis  Has had minor rectal bleeding off and on x 1 yr  Last colonoscopy 12/19 -diverticulosis and 3 mm polyp + rectocele  She will call back prn or go to ED  Meds ordered this encounter  Medications   DISCONTD: dicyclomine (BENTYL) 20 MG tablet    Sig: Take 1 tablet (20 mg total) by mouth 3 (three) times daily before meals.    Dispense:  90 tablet    Refill:  0   dicyclomine (BENTYL) 20 MG tablet    Sig: Take 1 tablet (20 mg total) by mouth every 6 (six) hours as needed for spasms (abdominal cramps).    Dispense:  60 tablet    Refill:  0    Disregard prior rx that was TID AC  \

## 2022-04-29 ENCOUNTER — Encounter: Payer: Self-pay | Admitting: Internal Medicine

## 2022-04-29 ENCOUNTER — Ambulatory Visit (INDEPENDENT_AMBULATORY_CARE_PROVIDER_SITE_OTHER): Payer: BC Managed Care – PPO | Admitting: Internal Medicine

## 2022-04-29 ENCOUNTER — Other Ambulatory Visit (INDEPENDENT_AMBULATORY_CARE_PROVIDER_SITE_OTHER): Payer: BC Managed Care – PPO

## 2022-04-29 VITALS — BP 132/76 | HR 90 | Ht 66.0 in | Wt 230.8 lb

## 2022-04-29 DIAGNOSIS — Z86010 Personal history of colonic polyps: Secondary | ICD-10-CM | POA: Diagnosis not present

## 2022-04-29 DIAGNOSIS — R1032 Left lower quadrant pain: Secondary | ICD-10-CM

## 2022-04-29 DIAGNOSIS — K921 Melena: Secondary | ICD-10-CM

## 2022-04-29 DIAGNOSIS — R1031 Right lower quadrant pain: Secondary | ICD-10-CM

## 2022-04-29 DIAGNOSIS — Z860101 Personal history of adenomatous and serrated colon polyps: Secondary | ICD-10-CM

## 2022-04-29 LAB — CBC WITH DIFFERENTIAL/PLATELET
Basophils Absolute: 0 10*3/uL (ref 0.0–0.1)
Basophils Relative: 0.5 % (ref 0.0–3.0)
Eosinophils Absolute: 0.2 10*3/uL (ref 0.0–0.7)
Eosinophils Relative: 2 % (ref 0.0–5.0)
HCT: 41 % (ref 36.0–46.0)
Hemoglobin: 13.8 g/dL (ref 12.0–15.0)
Lymphocytes Relative: 33.7 % (ref 12.0–46.0)
Lymphs Abs: 3.3 10*3/uL (ref 0.7–4.0)
MCHC: 33.8 g/dL (ref 30.0–36.0)
MCV: 85.5 fl (ref 78.0–100.0)
Monocytes Absolute: 0.8 10*3/uL (ref 0.1–1.0)
Monocytes Relative: 8 % (ref 3.0–12.0)
Neutro Abs: 5.4 10*3/uL (ref 1.4–7.7)
Neutrophils Relative %: 55.8 % (ref 43.0–77.0)
Platelets: 385 10*3/uL (ref 150.0–400.0)
RBC: 4.79 Mil/uL (ref 3.87–5.11)
RDW: 13.5 % (ref 11.5–15.5)
WBC: 9.7 10*3/uL (ref 4.0–10.5)

## 2022-04-29 NOTE — Progress Notes (Signed)
Tracey Rodriguez 62 y.o. [birth year] [id number]  Assessment & Plan:      Subjective:   Chief Complaint:  HPI   - Rectocele found on digital rectal exam. - One 3 mm polyp at the appendiceal orifice, removed with a cold snare. Resected and retrieved. - Diverticulosis in the sigmoid colon. - The examination was otherwise normal on direct and retroflexion views   Surgical [P], cecum, polyp - TUBULAR ADENOMA(S). 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 Allergies  Allergen Reactions   Amlodipine Itching    LEE LEE LEE   Capsaicin Anaphylaxis    Pt not sure of name    Etodolac Shortness Of Breath    REACTION: dyspnea/tightness in chest Other reaction(s): shortness of breath Other reaction(s): shortness of breath Other reaction(s): shortness of breath Other reaction(s): shortness of breath Other reaction(s): shortness of breath   Sulfa Antibiotics Nausea And Vomiting    Other reaction(s): Unknown Other reaction(s): Unknown Other reaction(s): Unknown Other reaction(s): Unknown   Solifenacin Rash    Other reaction(s): rash Other reaction(s): rash Other reaction(s): rash Other reaction(s): rash   Solifenacin Succinate Rash   Bee Venom    Pepto-Bismol [Bismuth] Nausea And Vomiting   Pork-Derived Products Nausea And Vomiting    Other reaction(s): vomiting Other reaction(s): vomiting Other reaction(s): vomiting   Rosuvastatin Itching    All statins  Other reaction(s): itching Other reaction(s): itching Other reaction(s): itching Other reaction(s): itching   Sulfonamide Derivatives Nausea And Vomiting    REACTION: vomiting   Current Meds  Medication Sig   b complex vitamins capsule Take 1 capsule by mouth daily.   B-D UF III MINI PEN NEEDLES 31G X 5 MM MISC daily. as directed   dexlansoprazole (DEXILANT) 60 MG capsule TAKE 1 CAPSULE (60 MG TOTAL) BY MOUTH DAILY. TAKE 30 MINUTES BEFORE BREAKFAST   fluorouracil (EFUDEX) 5 % cream Apply topically 2 (two) times  daily.   furosemide (LASIX) 20 MG tablet TAKE 2 TABLETS (40 MG TOTAL) BY MOUTH DAILY AS NEEDED FOR FLUID OR EDEMA.   hydrocortisone 2.5 % ointment hydrocortisone 2.5 % topical ointment   hydroquinone 4 % cream at bedtime.   levothyroxine (SYNTHROID, LEVOTHROID) 50 MCG tablet Take 50 mcg by mouth daily.   MAGNESIUM MALATE PO Take by mouth.   methocarbamol (ROBAXIN) 500 MG tablet TAKE 1 TABLET BY MOUTH DAILY AS NEEDED FOR MUSCLE SPASMS.   metoprolol succinate (TOPROL-XL) 50 MG 24 hr tablet Take 1 tablet by mouth daily.   Multiple Vitamins-Minerals (MULTIVITAMIN PO) Take by mouth daily.   mupirocin ointment (BACTROBAN) 2 % Place 1 application into the nose daily as needed (blood blisters).   Polyethyl Glycol-Propyl Glycol (SYSTANE OP) Apply to eye.   pregabalin (LYRICA) 50 MG capsule Take 1 capsule (50 mg total) by mouth at bedtime.   scopolamine (TRANSDERM-SCOP) 1 MG/3DAYS Transderm-Scop 1.5 mg transdermal patch (1 mg over 3 days)   triamcinolone cream (KENALOG) 0.1 % triamcinolone acetonide 0.1 % topical cream   Vibegron (GEMTESA) 75 MG TABS 1 tablet   Past Medical History:  Diagnosis Date   Arthritis    deg disc disease  - lower back, neck   Cancer (HCC)    left breast surgery-lumpectomy   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 (HCC)    Fibromyalgia    GERD (gastroesophageal reflux disease)    Heart murmur    Hx of adenomatous polyp of colon 02/04/2018   Hyperlipidemia    Hypothyroidism  goiter   NAFLD (nonalcoholic fatty liver disease)    Neuromuscular disorder (HCC)    sciatic nerve   PONV (postoperative nausea and vomiting)    Rectocele 01/27/2018   SVD (spontaneous vaginal delivery)    x 2   Past Surgical History:  Procedure Laterality Date   ABLATION     APPENDECTOMY     BREAST EXCISIONAL BIOPSY Left    BREAST SURGERY     left- lumpectomy   DILATATION & CURRETTAGE/HYSTEROSCOPY WITH RESECTOCOPE N/A 04/05/2012   Procedure: hysteroscopy with endocervical  curretting;  Surgeon: Allyn Kenner, DO;  Location: [facility];  Service: Gynecology;  Laterality: N/A;   DILATION AND CURETTAGE OF UTERUS     EYE SURGERY Bilateral 10/2017   cataract extraction    LIVER BIOPSY  09/2018   NOSE SURGERY     VAGINAL HYSTERECTOMY  09/10/2020   Social History   Social History Narrative   She is married, has children.  They are grown.  1 son and 1 daughter has at least 1 grandchild   Former smoker no significant alcohol no drug use   family history includes Breast cancer (age of onset: 51) in her maternal aunt; Breast cancer (age of onset: 74) in her mother; Cancer in her brother; Clotting disorder in her father; Colon polyps in her brother and brother; Crohn's disease in her father; Heart attack in her brother and father; Heart disease in her maternal grandfather and paternal aunt; Heart disease (age of onset: 59) in her father; Irritable bowel syndrome in her son; Kidney disease in her paternal uncle; Liver cancer in her maternal grandmother; Lung cancer in her brother and maternal grandfather; Ovarian cancer in her mother; Rheumatologic disease in her father; Stroke in her brother and paternal aunt; Throat cancer in her brother.   Review of Systems   Objective:   Physical Exam

## 2022-04-29 NOTE — Patient Instructions (Signed)
Your provider has requested that you go to the basement level for lab work before leaving today. Press "B" on the elevator. The lab is located at the first door on the left as you exit the elevator.  Due to recent changes in healthcare laws, you may see the results of your imaging and laboratory studies on MyChart before your provider has had a chance to review them.  We understand that in some cases there may be results that are confusing or concerning to you. Not all laboratory results come back in the same time frame and the provider may be waiting for multiple results in order to interpret others.  Please give [place] 48 hours in order for your provider to thoroughly review all the results before contacting the office for clarification of your results.   You have been scheduled for a colonoscopy. Please follow written instructions given to you at your visit today.  Please pick up your prep supplies at the pharmacy within the next 1-3 days. If you use inhalers (even only as needed), please bring them with you on the day of your procedure.  _______________________________________________________  If your blood pressure at your visit was 140/90 or greater, please contact your primary care physician to follow up on this.  _______________________________________________________  If you are age 30 or older, your body mass index should be between 23-30. Your Body mass index is 37.25 kg/m. If this is out of the aforementioned range listed, please consider follow up with your Primary Care Provider.  If you are age 68 or younger, your body mass index should be between 19-25. Your Body mass index is 37.25 kg/m. If this is out of the aformentioned range listed, please consider follow up with your Primary Care Provider.   ________________________________________________________  The [facility] providers would like to encourage you to use [facility] to communicate with providers for non-urgent requests or  questions.  Due to long hold times on the telephone, sending your provider a message by [facility] may be a faster and more efficient way to get a response.  Please allow 48 business hours for a response.  Please remember that this is for non-urgent requests.  _______________________________________________________  I appreciate the opportunity to care for you. Silvano Rusk, MD, [facility]

## 2022-04-29 NOTE — Progress Notes (Signed)
Tracey Rodriguez 62 y.o. January 29, 1961 [id number]  Assessment & Plan:   Encounter Diagnoses  Name Primary?   Hematochezia Yes   Abdominal pain, bilateral lower quadrant    Hx of adenomatous polyp of colon    I am suspicious she had ischemic colitis, probably nonocclusive.  I have recommended she consents to having a diagnostic colonoscopy to look for other causes.  Will check a CBC as well.The risks and benefits as well as alternatives of endoscopic procedure(s) have been discussed and reviewed. All questions answered. The patient agrees to proceed.   CC: Aura Dials, MD   Subjective:   Chief Complaint: Abdominal pain and bloody stools  HPI Tracey Rodriguez is a 62 year old white woman with fatty liver disease (metabolic), IBS, fibromyalgia and history of adenomatous colon polyp who developed severe abdominal cramps and pain in the bilateral lower quadrants last week, associated with bloody diarrhea.  This eventually resolved.  She was seen at the [facility] walk-in clinic and offered dicyclomine.  No blood count checked.  She feels back to normal at this time with normal defecation.  No persistent bleeding.  She does not recall having bleeding like this in the past.  Last colonoscopy was in 2019 with a diminutive adenoma, and mild sigmoid diverticulosis.  Review of systems notable for liver FibroScan with stiffness of 7.3K PA 0-F1 interpretation and greater than 66% steatosis.  She is seeing Dr. Gertie Fey of [facility] regarding fatty liver.  She remains a patient of mine otherwise.  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  Allergies  Allergen Reactions   Amlodipine Itching    LEE LEE LEE   Capsaicin Anaphylaxis    Pt not sure of name    Etodolac Shortness Of Breath    REACTION: dyspnea/tightness in chest Other reaction(s): shortness of breath Other reaction(s): shortness of  breath Other reaction(s): shortness of breath Other reaction(s): shortness of breath Other reaction(s): shortness of breath   Sulfa Antibiotics Nausea And Vomiting    Other reaction(s): Unknown Other reaction(s): Unknown Other reaction(s): Unknown Other reaction(s): Unknown   Solifenacin Rash    Other reaction(s): rash Other reaction(s): rash Other reaction(s): rash Other reaction(s): rash   Solifenacin Succinate Rash   Bee Venom    Pepto-Bismol [Bismuth] Nausea And Vomiting   Pork-Derived Products Nausea And Vomiting    Other reaction(s): vomiting Other reaction(s): vomiting Other reaction(s): vomiting   Rosuvastatin Itching    All statins  Other reaction(s): itching Other reaction(s): itching Other reaction(s): itching Other reaction(s): itching   Sulfonamide Derivatives Nausea And Vomiting    REACTION: vomiting   Current Meds  Medication Sig   b complex vitamins capsule Take 1 capsule by mouth daily.   B-D UF III MINI PEN NEEDLES 31G X 5 MM MISC daily. as directed   dexlansoprazole (DEXILANT) 60 MG capsule TAKE 1 CAPSULE (60 MG TOTAL) BY MOUTH DAILY. TAKE 30 MINUTES BEFORE BREAKFAST   fluorouracil (EFUDEX) 5 % cream Apply topically 2 (two) times daily.   furosemide (LASIX) 20 MG tablet TAKE 2 TABLETS (40 MG TOTAL) BY MOUTH DAILY AS NEEDED FOR FLUID OR EDEMA.   hydrocortisone 2.5 % ointment hydrocortisone 2.5 % topical ointment   hydroquinone 4 % cream at bedtime.   levothyroxine (SYNTHROID, LEVOTHROID) 50 MCG tablet Take 50 mcg by mouth daily.   MAGNESIUM MALATE PO Take by mouth.   methocarbamol (ROBAXIN) 500 MG tablet TAKE 1 TABLET BY  MOUTH DAILY AS NEEDED FOR MUSCLE SPASMS.   metoprolol succinate (TOPROL-XL) 50 MG 24 hr tablet Take 1 tablet by mouth daily.   Multiple Vitamins-Minerals (MULTIVITAMIN PO) Take by mouth daily.   mupirocin ointment (BACTROBAN) 2 % Place 1 application into the nose daily as needed (blood blisters).   Polyethyl Glycol-Propyl Glycol (SYSTANE  OP) Apply to eye.   pregabalin (LYRICA) 50 MG capsule Take 1 capsule (50 mg total) by mouth at bedtime.   scopolamine (TRANSDERM-SCOP) 1 MG/3DAYS Transderm-Scop 1.5 mg transdermal patch (1 mg over 3 days)   triamcinolone cream (KENALOG) 0.1 % triamcinolone acetonide 0.1 % topical cream   Vibegron (GEMTESA) 75 MG TABS 1 tablet   Past Medical History:  Diagnosis Date   Arthritis    deg disc disease  - lower back, neck   Cancer (HCC)    left breast surgery-lumpectomy   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 (HCC)    Fibromyalgia    GERD (gastroesophageal reflux disease)    Heart murmur    Hx of adenomatous polyp of colon 02/04/2018   Hyperlipidemia    Hypothyroidism    goiter   NAFLD (nonalcoholic fatty liver disease)    Neuromuscular disorder (HCC)    sciatic nerve   PONV (postoperative nausea and vomiting)    Rectocele 01/27/2018   SVD (spontaneous vaginal delivery)    x 2   Past Surgical History:  Procedure Laterality Date   ABLATION     APPENDECTOMY     BREAST EXCISIONAL BIOPSY Left    BREAST SURGERY     left- lumpectomy   DILATATION & CURRETTAGE/HYSTEROSCOPY WITH RESECTOCOPE N/A 04/05/2012   Procedure: hysteroscopy with endocervical curretting;  Surgeon: Allyn Kenner, DO;  Location: [facility];  Service: Gynecology;  Laterality: N/A;   DILATION AND CURETTAGE OF UTERUS     EYE SURGERY Bilateral 10/2017   cataract extraction    LIVER BIOPSY  09/2018   NOSE SURGERY     VAGINAL HYSTERECTOMY  09/10/2020   Social History   Social History Narrative   She is married, has children.  They are grown.  1 son and 1 daughter has at least 1 grandchild   Former smoker no significant alcohol no drug use   family history includes Breast cancer (age of onset: 66) in her maternal aunt; Breast cancer (age of onset: 54) in her mother; Cancer in her brother; Clotting disorder in her father; Colon polyps in her brother and brother; Crohn's disease in her father; Heart attack in her  brother and father; Heart disease in her maternal grandfather and paternal aunt; Heart disease (age of onset: 24) in her father; Irritable bowel syndrome in her son; Kidney disease in her paternal uncle; Liver cancer in her maternal grandmother; Lung cancer in her brother and maternal grandfather; Ovarian cancer in her mother; Rheumatologic disease in her father; Stroke in her brother and paternal aunt; Throat cancer in her brother.   Review of Systems As per HPI  Objective:   Physical Exam '@BP'$  132/76   Pulse 90   Ht '5\' 6"'$  (1.676 m)   Wt 230 lb 12.8 oz (104.7 kg)   BMI 37.25 kg/m @  General:  NAD Eyes:   anicteric Lungs:  clear Heart::  S1S2 no rubs, murmurs or gallops Abdomen:  soft and nontender, BS+ Ext:   no edema, cyanosis or clubbing    Data Reviewed:  See HPI

## 2022-05-02 ENCOUNTER — Ambulatory Visit: Payer: BC Managed Care – PPO | Admitting: Rheumatology

## 2022-05-12 DIAGNOSIS — R948 Abnormal results of function studies of other organs and systems: Secondary | ICD-10-CM | POA: Diagnosis not present

## 2022-05-12 DIAGNOSIS — R635 Abnormal weight gain: Secondary | ICD-10-CM | POA: Diagnosis not present

## 2022-05-13 IMAGING — MR MR LUMBAR SPINE W/O CM
4 of 5 series · 24 of 48 positions shown · non-contrast
Comparison: Lumbar spine radiographs 01/08/2021, lumbar spine MRI
09/27/2007

CLINICAL DATA: Spondylolisthesis, low back pain

EXAM:
MRI LUMBAR SPINE WITHOUT CONTRAST
TECHNIQUE: Multiplanar, multisequence MR imaging of the lumbar spine was
performed. No intravenous contrast was administered.

[Series 2: T2 · sagittal · 4.0mm · 0.53mm/px · 7 of 15 slices shown (1 of 2)]
[im 1/15]
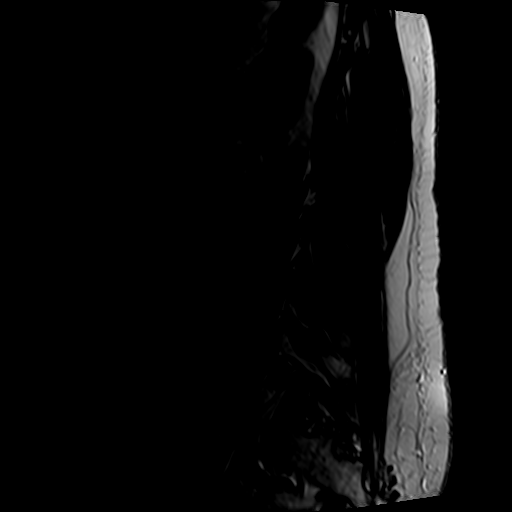
[im 3/15]
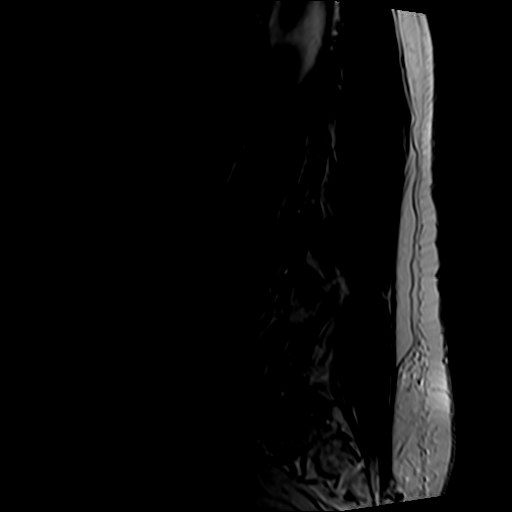
[im 5/15]
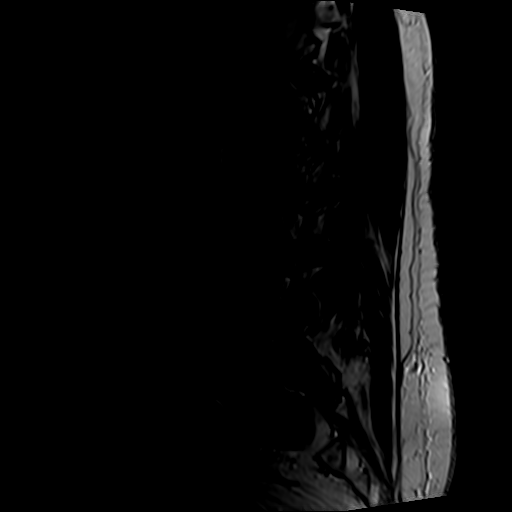
[im 8/15]
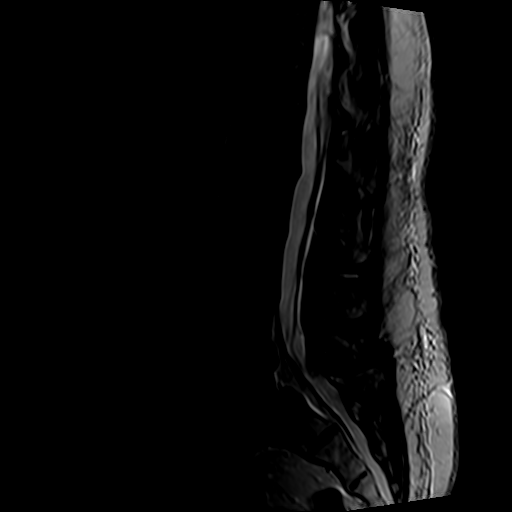
[im 10/15]
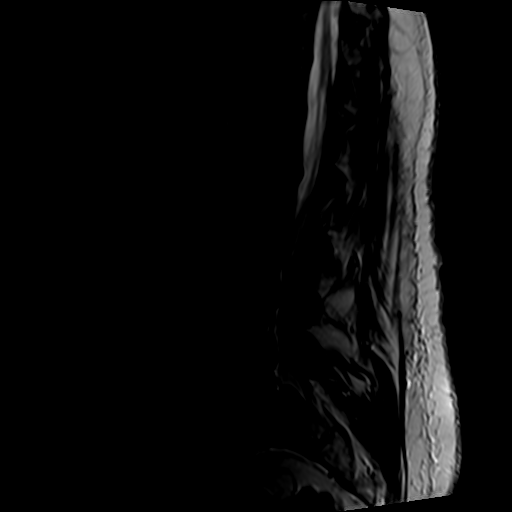
[im 12/15]
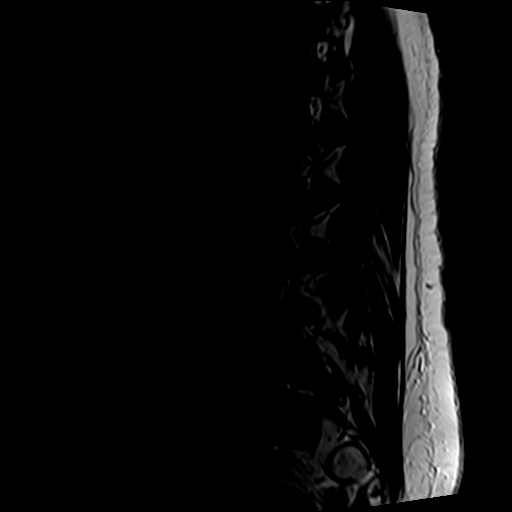
[im 15/15]
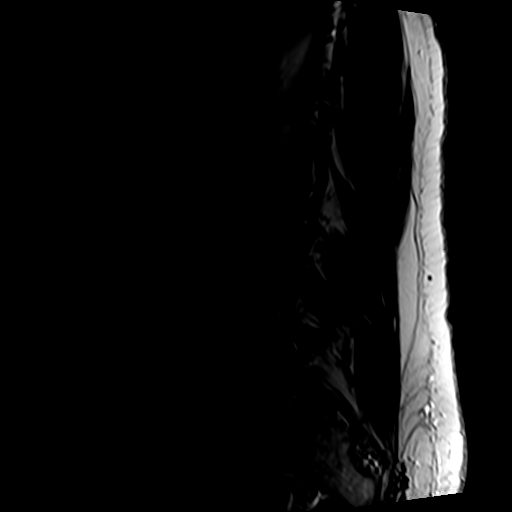

[Series 4: T1 · sagittal · 4.0mm · 0.53mm/px · 6 of 15 slices shown (1 of 2)]
[im 1/15]
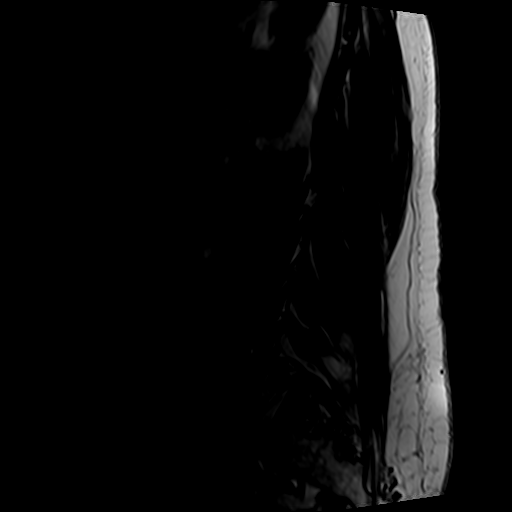
[im 3/15]
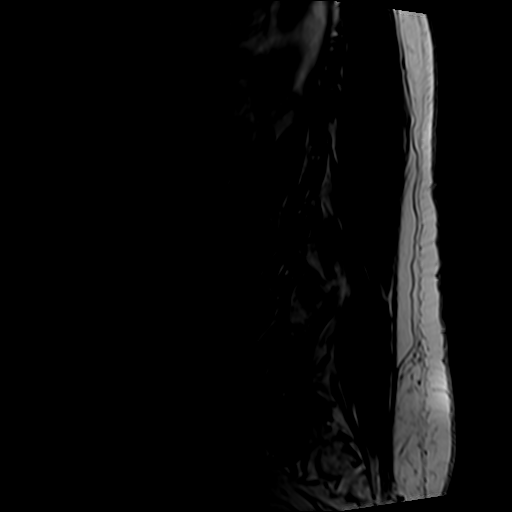
[im 6/15]
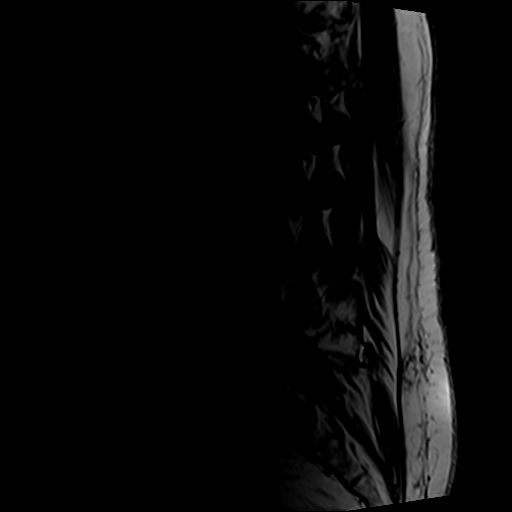
[im 9/15]
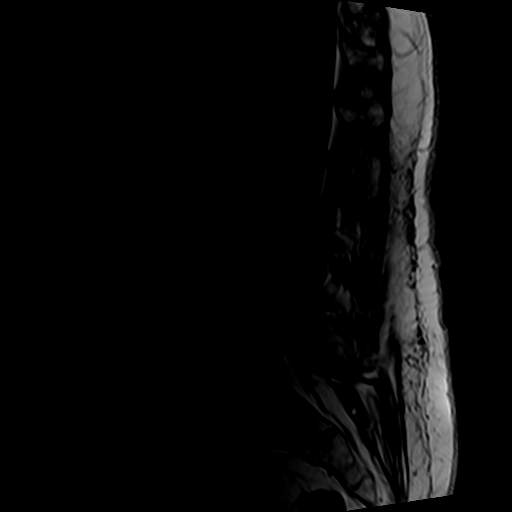
[im 12/15]
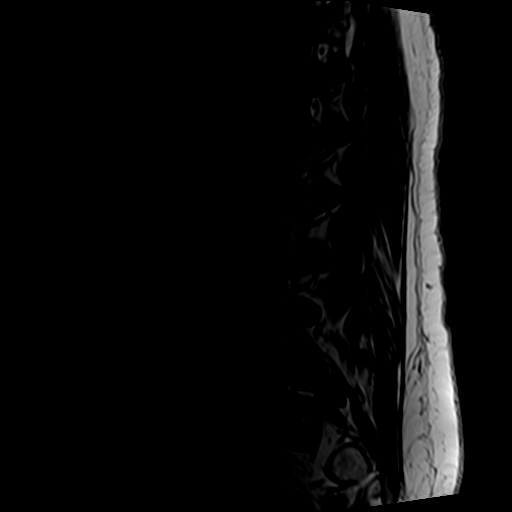
[im 15/15]
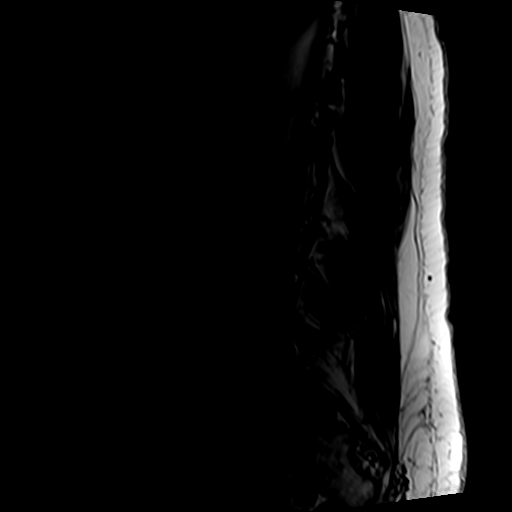

[Series 5: T2 · axial · 4.0mm · 0.70mm/px · z∈[-140,+38]mm · 8 of 33 slices shown (2 of 2)]
[im 1/33]
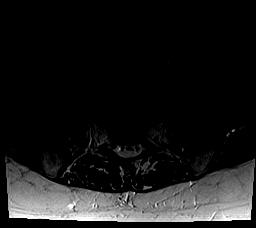
[im 5/33]
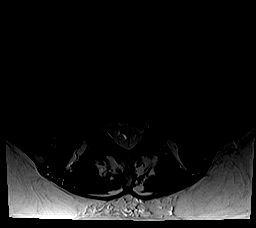
[im 10/33]
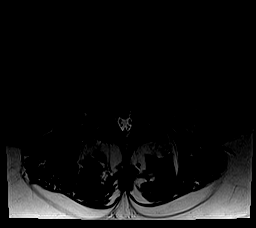
[im 15/33]
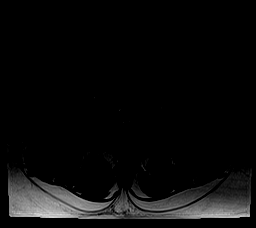
[im 18/33]
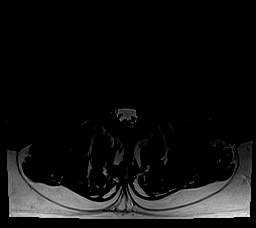
[im 23/33]
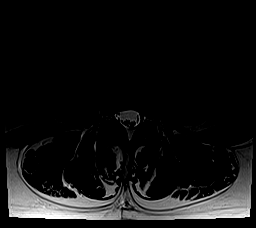
[im 28/33]
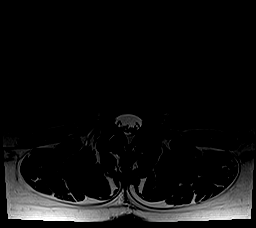
[im 33/33]
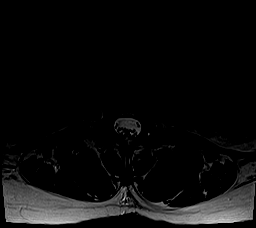

[Series 6: T1 · axial · 4.0mm · 0.35mm/px · z∈[-120,+12]mm · 3 of 33 slices shown (2 of 2)]
[im 5/33]
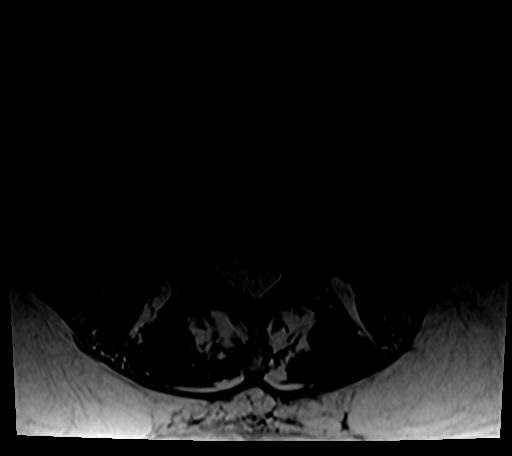
[im 18/33]
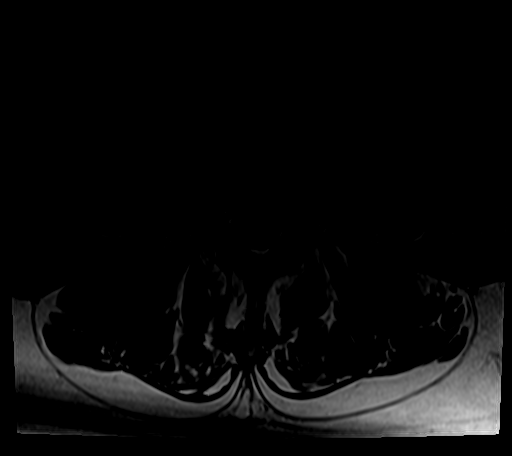
[im 28/33]
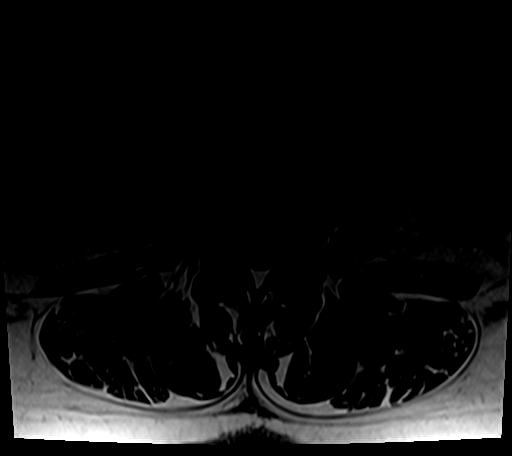

[24 of 48 positions shown; findings below may reference images not displayed]

FINDINGS: Segmentation: Standard; the lowest formed disc space is designated
L5-S1.

Alignment: There are bilateral pars defects at L5-S1 with 6 mm
anterolisthesis of L5 on S1, minimally increased since 0226.
Alignment at the other levels is normal.

Vertebrae: Vertebral body heights are preserved. There is minimal
degenerative endplate marrow signal abnormality at L4-L5. There is
no suspicious marrow signal abnormality.

Conus medullaris and cauda equina: Conus extends to the mid L1
level. Conus and cauda equina appear normal.

Paraspinal and other soft tissues: Unremarkable.

Disc levels:

There is marked disc desiccation and narrowing at L5-S1, progressed
since 0226. There is more mild disc desiccation without significant
loss of height at the remaining levels. There is relatively mild
multilevel facet arthropathy throughout the lumbar spine

T12-L1: No significant spinal canal or neural foraminal stenosis

L1-L2: There is a mild diffuse disc bulge without significant spinal
canal or neural foraminal stenosis.

L2-L3: There is a mild disc bulge and mild bilateral facet
arthropathy without significant spinal canal or neural foraminal
stenosis.

L3-L4: There is a minimal disc bulge and mild bilateral facet
arthropathy without significant spinal canal or neural foraminal
stenosis

L4-L5: There is a mild disc bulge and mild bilateral facet
arthropathy resulting in mild crowding of the left subarticular zone
without evidence of nerve root impingement and mild left and no
significant right neural foraminal stenosis

L5-S1: There is grade 1 anterolisthesis with uncovering of the disc
posteriorly, degenerative endplate change, and bilateral facet
arthropathy resulting in moderate left worse than right neural
foraminal stenosis without significant spinal canal stenosis. The
neural foraminal stenosis is slightly worsened on the right since
0226.
IMPRESSION: 1. Bilateral L5-S1 pars defects with 6 mm grade 1 anterolisthesis of
L5 on S1, minimally progressed since 0226. There is moderate
bilateral neural foraminal stenosis at this level, slightly worsened
on the right since [DATE]. Relatively mild multilevel facet arthropathy throughout the
lumbar spine
3. Otherwise, mild degenerative changes throughout the remainder of
the lumbar spine without significant spinal canal or neural
foraminal stenosis.

## 2022-05-20 ENCOUNTER — Other Ambulatory Visit: Payer: Self-pay | Admitting: Internal Medicine

## 2022-05-22 DIAGNOSIS — R7303 Prediabetes: Secondary | ICD-10-CM | POA: Diagnosis not present

## 2022-05-22 DIAGNOSIS — E038 Other specified hypothyroidism: Secondary | ICD-10-CM | POA: Diagnosis not present

## 2022-05-22 DIAGNOSIS — E782 Mixed hyperlipidemia: Secondary | ICD-10-CM | POA: Diagnosis not present

## 2022-05-22 DIAGNOSIS — K760 Fatty (change of) liver, not elsewhere classified: Secondary | ICD-10-CM | POA: Diagnosis not present

## 2022-05-26 ENCOUNTER — Encounter: Payer: Self-pay | Admitting: Internal Medicine

## 2022-05-28 DIAGNOSIS — J0101 Acute recurrent maxillary sinusitis: Secondary | ICD-10-CM | POA: Diagnosis not present

## 2022-05-28 DIAGNOSIS — N3946 Mixed incontinence: Secondary | ICD-10-CM | POA: Diagnosis not present

## 2022-06-01 ENCOUNTER — Other Ambulatory Visit: Payer: Self-pay | Admitting: Internal Medicine

## 2022-06-03 ENCOUNTER — Encounter: Payer: Self-pay | Admitting: Certified Registered Nurse Anesthetist

## 2022-06-04 ENCOUNTER — Ambulatory Visit (AMBULATORY_SURGERY_CENTER): Payer: BC Managed Care – PPO | Admitting: Internal Medicine

## 2022-06-04 ENCOUNTER — Encounter: Payer: Self-pay | Admitting: Internal Medicine

## 2022-06-04 VITALS — BP 141/76 | HR 64 | Temp 98.2°F | Resp 10 | Ht 66.0 in | Wt 230.0 lb

## 2022-06-04 DIAGNOSIS — R1031 Right lower quadrant pain: Secondary | ICD-10-CM

## 2022-06-04 DIAGNOSIS — Z86010 Personal history of colonic polyps: Secondary | ICD-10-CM

## 2022-06-04 DIAGNOSIS — K921 Melena: Secondary | ICD-10-CM | POA: Diagnosis not present

## 2022-06-04 MED ORDER — SODIUM CHLORIDE 0.9 % IV SOLN
500.0000 mL | Freq: Once | INTRAVENOUS | Status: DC
Start: 2022-06-04 — End: 2022-06-04

## 2022-06-04 NOTE — Progress Notes (Signed)
Report given to PACU, vss 

## 2022-06-04 NOTE — Patient Instructions (Addendum)
No polyps were seen. Nothing to cause bleeding - as I had explained before I think you had an episode of ischemc colitis related to colon spasms.  If you have more problems, let me know.  Next routine colonoscopy or other screening test in 10 years - 2034.  I appreciate the opportunity to care for you. Iva Boop, MD, [facility]  Recommendation: Patient has a contact number available for                            emergencies. The signs and symptoms of potential                            delayed complications were discussed with the                            patient. Return to normal activities tomorrow.                            Written discharge instructions were provided to the                            patient.                           - Resume previous diet.                           - Continue present medications.                           - Repeat colonoscopy in 10 years.  YOU HAD AN ENDOSCOPIC PROCEDURE TODAY AT THE [facility]:   Refer to the procedure report that was given to you for any specific questions about what was found during the examination.  If the procedure report does not answer your questions, please call your gastroenterologist to clarify.  If you requested that your care partner not be given the details of your procedure findings, then the procedure report has been included in a sealed envelope for you to review at your convenience later.  YOU SHOULD EXPECT: Some feelings of bloating in the abdomen. Passage of more gas than usual.  Walking can help get rid of the air that was put into your GI tract during the procedure and reduce the bloating. If you had a lower endoscopy (such as a colonoscopy or flexible sigmoidoscopy) you may notice spotting of blood in your stool or on the toilet paper. If you underwent a bowel prep for your procedure, you may not have a normal bowel movement for a few days.  Please Note:  You might notice some irritation and  congestion in your nose or some drainage.  This is from the oxygen used during your procedure.  There is no need for concern and it should clear up in a day or so.  SYMPTOMS TO REPORT IMMEDIATELY:  Following lower endoscopy (colonoscopy or flexible sigmoidoscopy):  Excessive amounts of blood in the stool  Significant tenderness or worsening of abdominal pains  Swelling of the abdomen that is new, acute  For urgent or emergent issues, a gastroenterologist can be reached at any hour by calling (  [phone]. Do not use MyChart messaging for urgent concerns.    DIET:  We do recommend a small meal at first, but then you may proceed to your regular diet.  Drink plenty of fluids but you should avoid alcoholic beverages for 24 hours.  ACTIVITY:  You should plan to take it easy for the rest of today and you should NOT DRIVE or use heavy machinery until tomorrow (because of the sedation medicines used during the test).    FOLLOW UP: Our staff will call the number listed on your records the next business day following your procedure.  We will call around 7:15- 8:00 am to check on you and address any questions or concerns that you may have regarding the information given to you following your procedure. If we do not reach you, we will leave a message.     If any biopsies were taken you will be contacted by phone or by letter within the next 1-3 weeks.  Please call us at [phone] if you have not heard about the biopsies in 3 weeks.    SIGNATURES/CONFIDENTIALITY: You and/or your care partner have signed paperwork which will be entered into your electronic medical record.  These signatures attest to the fact that that the information above on your After Visit Summary has been reviewed and is understood.  Full responsibility of the confidentiality of this discharge information lies with you and/or your care-partner.

## 2022-06-04 NOTE — Progress Notes (Signed)
[facility] History and Physical   Primary Care Physician:  Henrine Screws, MD   Reason for Procedure:   hematochezia  Plan:    colonoscopy     HPI: Tracey Rodriguez is a 62 y.o. female w/ recent hematochezia as outlined in 3/12 note.  Suspect she had ischemic colitis. 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    Fibromyalgia    GERD (gastroesophageal reflux disease)    Heart murmur    Hx of adenomatous polyp of colon 02/04/2018   Hyperlipidemia    Hypothyroidism    goiter   NAFLD (nonalcoholic fatty liver disease)    Neuromuscular disorder    sciatic nerve   PONV (postoperative nausea and vomiting)    Rectocele 01/27/2018   SVD (spontaneous vaginal delivery)    x 2    Past Surgical History:  Procedure Laterality Date   ABLATION     APPENDECTOMY     BREAST EXCISIONAL BIOPSY Left    BREAST SURGERY     left- lumpectomy   DILATATION & CURRETTAGE/HYSTEROSCOPY WITH RESECTOCOPE N/A 04/05/2012   Procedure: hysteroscopy with endocervical curretting;  Surgeon: Philip Aspen, DO;  Location: [facility];  Service: Gynecology;  Laterality: N/A;   DILATION AND CURETTAGE OF UTERUS     EYE SURGERY Bilateral 10/2017   cataract extraction    LIVER BIOPSY  09/2018   NOSE SURGERY     VAGINAL HYSTERECTOMY  09/10/2020    Prior to Admission medications   Medication Sig Start Date End Date Taking? Authorizing Provider  amoxicillin-clavulanate (AUGMENTIN) 875-125 MG tablet Take 875 mg by mouth 2 (two) times daily. 05/28/22  Yes [provider]  b complex vitamins capsule Take 1 capsule by mouth daily.   Yes [provider]  dexlansoprazole (DEXILANT) 60 MG capsule TAKE 1 CAPSULE (60 MG TOTAL) BY MOUTH DAILY. TAKE 30 MINUTES BEFORE BREAKFAST 03/12/22  Yes Iva Boop, MD  levothyroxine  (SYNTHROID, LEVOTHROID) 50 MCG tablet Take 50 mcg by mouth daily.   Yes [provider]  MAGNESIUM MALATE PO Take by mouth.   Yes [provider]  metoprolol succinate (TOPROL-XL) 50 MG 24 hr tablet Take 1 tablet by mouth daily. 10/05/18  Yes [provider]  Multiple Vitamins-Minerals (MULTIVITAMIN PO) Take by mouth daily.   Yes [provider]  Polyethyl Glycol-Propyl Glycol (SYSTANE OP) Apply to eye.   Yes [provider]  pregabalin (LYRICA) 50 MG capsule Take 1 capsule (50 mg total) by mouth at bedtime. 12/12/21  Yes Kirsteins, Victorino Sparrow, MD  Vibegron (GEMTESA) 75 MG TABS 1 tablet   Yes [provider]  B-D UF III MINI PEN NEEDLES 31G X 5 MM MISC daily. as directed Patient not taking: Reported on 06/04/2022 01/02/21   [provider]  dicyclomine (BENTYL) 20 MG tablet TAKE 1 TABLET (20 MG TOTAL) BY MOUTH EVERY 6 (SIX) HOURS AS NEEDED FOR SPASMS (ABDOMINAL CRAMPS). 06/02/22   Iva Boop, MD  fluorouracil (EFUDEX) 5 % cream Apply topically 2 (two) times daily.    [provider]  furosemide (LASIX) 20 MG tablet TAKE 2 TABLETS (40 MG TOTAL) BY MOUTH DAILY AS NEEDED FOR FLUID OR EDEMA. 12/16/21   Corky Crafts, MD  hydrocortisone 2.5 % ointment hydrocortisone 2.5 %  topical ointment    [provider]  hydroquinone 4 % cream at bedtime. 04/04/20   [provider]  methocarbamol (ROBAXIN) 500 MG tablet TAKE 1 TABLET BY MOUTH DAILY AS NEEDED FOR MUSCLE SPASMS. 08/14/21   Gearldine Bienenstock, PA-C  mupirocin ointment (BACTROBAN) 2 % Place 1 application into the nose daily as needed (blood blisters).    [provider]  scopolamine (TRANSDERM-SCOP) 1 MG/3DAYS Transderm-Scop 1.5 mg transdermal patch (1 mg over 3 days)    [provider]  triamcinolone cream (KENALOG) 0.1 % triamcinolone acetonide 0.1 % topical cream    [provider]    Current Outpatient Medications  Medication Sig  Dispense Refill   amoxicillin-clavulanate (AUGMENTIN) 875-125 MG tablet Take 875 mg by mouth 2 (two) times daily.     b complex vitamins capsule Take 1 capsule by mouth daily.     dexlansoprazole (DEXILANT) 60 MG capsule TAKE 1 CAPSULE (60 MG TOTAL) BY MOUTH DAILY. TAKE 30 MINUTES BEFORE BREAKFAST 90 capsule 1   levothyroxine (SYNTHROID, LEVOTHROID) 50 MCG tablet Take 50 mcg by mouth daily.     MAGNESIUM MALATE PO Take by mouth.     metoprolol succinate (TOPROL-XL) 50 MG 24 hr tablet Take 1 tablet by mouth daily.     Multiple Vitamins-Minerals (MULTIVITAMIN PO) Take by mouth daily.     Polyethyl Glycol-Propyl Glycol (SYSTANE OP) Apply to eye.     pregabalin (LYRICA) 50 MG capsule Take 1 capsule (50 mg total) by mouth at bedtime. 30 capsule 5   Vibegron (GEMTESA) 75 MG TABS 1 tablet     B-D UF III MINI PEN NEEDLES 31G X 5 MM MISC daily. as directed (Patient not taking: Reported on 06/04/2022)     dicyclomine (BENTYL) 20 MG tablet TAKE 1 TABLET (20 MG TOTAL) BY MOUTH EVERY 6 (SIX) HOURS AS NEEDED FOR SPASMS (ABDOMINAL CRAMPS). 60 tablet 1   fluorouracil (EFUDEX) 5 % cream Apply topically 2 (two) times daily.     furosemide (LASIX) 20 MG tablet TAKE 2 TABLETS (40 MG TOTAL) BY MOUTH DAILY AS NEEDED FOR FLUID OR EDEMA. 180 tablet 3   hydrocortisone 2.5 % ointment hydrocortisone 2.5 % topical ointment     hydroquinone 4 % cream at bedtime.     methocarbamol (ROBAXIN) 500 MG tablet TAKE 1 TABLET BY MOUTH DAILY AS NEEDED FOR MUSCLE SPASMS. 30 tablet 0   mupirocin ointment (BACTROBAN) 2 % Place 1 application into the nose daily as needed (blood blisters).     scopolamine (TRANSDERM-SCOP) 1 MG/3DAYS Transderm-Scop 1.5 mg transdermal patch (1 mg over 3 days)     triamcinolone cream (KENALOG) 0.1 % triamcinolone acetonide 0.1 % topical cream     Current Facility-Administered Medications  Medication Dose Route Frequency Provider Last Rate Last Admin   0.9 %  sodium chloride infusion  500 mL Intravenous  Once Iva Boop, MD        Allergies as of 06/04/2022 - Review Complete 06/04/2022  Allergen Reaction Noted   Amlodipine Itching 05/04/2020   Capsaicin Anaphylaxis 11/25/2013   Etodolac Shortness Of Breath 02/23/2008   Sulfa antibiotics Nausea And Vomiting 01/15/2021   Solifenacin Rash 01/15/2021   Solifenacin succinate Rash 01/15/2021   Bee venom  09/15/2016   Pepto-bismol [bismuth] Nausea And Vomiting 03/27/2013   Pork-derived products Nausea And Vomiting 03/27/2013   Rosuvastatin Itching 01/05/2018   Sulfonamide derivatives Nausea And Vomiting 02/23/2008    Family History  Problem Relation Age of Onset   Breast  cancer Mother 63   Ovarian cancer Mother    Heart disease Father 50   Heart attack Father    Clotting disorder Father    Rheumatologic disease Father    Crohn's disease Father    Esophageal cancer Brother    Heart attack Brother    Cancer Brother    Lung cancer Brother    Colon polyps Brother    Throat cancer Brother    Colon polyps Brother    Stroke Brother    Breast cancer Maternal Aunt 60   Esophageal cancer Maternal Uncle    Stroke Paternal Aunt    Heart disease Paternal Aunt    Kidney disease Paternal Uncle    Liver cancer Maternal Grandmother    Heart disease Maternal Grandfather    Lung cancer Maternal Grandfather    Irritable bowel syndrome Son    Colon cancer Neg Hx    Rectal cancer Neg Hx    Stomach cancer Neg Hx     Social History   Socioeconomic History   Marital status: Married    Spouse name: Not on file   Number of children: 2   Years of education: Not on file   Highest education level: Not on file  Occupational History   Not on file  Tobacco Use   Smoking status: Former    Packs/day: 3.00    Years: 22.00    Additional pack years: 0.00    Total pack years: 66.00    Types: Cigarettes    Quit date: 02/18/2000    Years since quitting: 22.3    Passive exposure: Never   Smokeless tobacco: Never  Vaping Use   Vaping Use:  Never used  Substance and Sexual Activity   Alcohol use: Yes    Alcohol/week: 0.0 standard drinks of alcohol    Comment: RARELY   Drug use: Not Currently   Sexual activity: Yes    Birth control/protection: Post-menopausal  Other Topics Concern   Not on file  Social History Narrative   She is married, has children.  They are grown.  1 son and 1 daughter has at least 1 grandchild   Former smoker no significant alcohol no drug use   Social Determinants of Corporate investment banker Strain: Not on file  Food Insecurity: Not on file  Transportation Needs: Not on file  Physical Activity: Not on file  Stress: Not on file  Social Connections: Not on file  Intimate Partner Violence: Not on file    Review of Systems:  All other review of systems negative except as mentioned in the HPI.  Physical Exam: Vital signs BP (!) 160/87   Pulse 69   Temp 98.2 F (36.8 C)   Ht  (1.676 m)   Wt 230 lb (104.3 kg)   SpO2 96%   BMI 37.12 kg/m   General:   Alert,  Well-developed, well-nourished, pleasant and cooperative in NAD Lungs:  Clear throughout to auscultation.   Heart:  Regular rate and rhythm; no murmurs, clicks, rubs,  or gallops. Abdomen:  Soft, nontender and nondistended. Normal bowel sounds.   Neuro/Psych:  Alert and cooperative. Normal mood and affect. A and O x 3    Sena Slate, MD, [facility] [phone] (pager) 06/04/2022 1:31 PM@

## 2022-06-04 NOTE — Op Note (Signed)
[name] Procedure Date: 06/04/2022 1:34 PM MRN: [id number] Endoscopist: Iva Boop , MD, [id number] Age: 62 Referring MD:  Date of Birth: 1960/07/05 Gender: Female Account #: [id number] Procedure:                Colonoscopy Indications:              Hematochezia Medicines:                Monitored Anesthesia Care Procedure:                Pre-Anesthesia Assessment:                           - Prior to the procedure, a History and Physical                            was performed, and patient medications and                            allergies were reviewed. The patient's tolerance of                            previous anesthesia was also reviewed. The risks                            and benefits of the procedure and the sedation                            options and risks were discussed with the patient.                            All questions were answered, and informed consent                            was obtained. Prior Anticoagulants: The patient has                            taken no anticoagulant or antiplatelet agents. ASA                            Grade Assessment: II - A patient with mild systemic                            disease. After reviewing the risks and benefits,                            the patient was deemed in satisfactory condition to                            undergo the procedure.                           After obtaining informed consent, the colonoscope  was passed under direct vision. Throughout the                            procedure, the patient's blood pressure, pulse, and                            oxygen saturations were monitored continuously. The                            Olympus PCF-[id number] (#[id number]) Colonoscope was                            introduced through the anus and advanced to the the                            cecum, identified by appendiceal orifice and                             ileocecal valve. The colonoscopy was performed                            without difficulty. The patient tolerated the                            procedure well. The quality of the bowel                            preparation was good. The ileocecal valve,                            appendiceal orifice, and rectum were photographed.                            The bowel preparation used was Miralax via split                            dose instruction. Scope In: 1:41:28 PM Scope Out: 1:59:31 PM Scope Withdrawal Time: 0 hours 13 minutes 53 seconds  Total Procedure Duration: 0 hours 18 minutes 3 seconds  Findings:                 The perianal and digital rectal examinations were                            normal.                           A few diverticula were found in the sigmoid colon.                           The exam was otherwise without abnormality on                            direct and retroflexion views. Complications:            No immediate complications.  Estimated Blood Loss:     Estimated blood loss: none. Impression:               - Diverticulosis in the sigmoid colon.                           - The examination was otherwise normal on direct                            and retroflexion views. I think she had an episode                            of non-occlusive ischemic colitis which is more                            frequent in IBS patients                           - No specimens collected.                           - Personal history of colonic polyp dimintive                            adenoma 01/2018 Recommendation:           - Patient has a contact number available for                            emergencies. The signs and symptoms of potential                            delayed complications were discussed with the                            patient. Return to normal activities tomorrow.                            Written discharge  instructions were provided to the                            patient.                           - Resume previous diet.                           - Continue present medications.                           - Repeat colonoscopy in 10 years. Iva Boop, MD 06/04/2022 2:08:37 PM This report has been signed electronically.

## 2022-06-05 ENCOUNTER — Telehealth: Payer: Self-pay

## 2022-06-05 NOTE — Telephone Encounter (Signed)
No answer, left message to call if having any issues or concerns, B.Kaelea Gathright RN 

## 2022-06-13 ENCOUNTER — Encounter: Payer: Self-pay | Admitting: Physical Medicine & Rehabilitation

## 2022-06-13 ENCOUNTER — Encounter
Payer: BC Managed Care – PPO | Attending: Physical Medicine & Rehabilitation | Admitting: Physical Medicine & Rehabilitation

## 2022-06-13 VITALS — BP 151/82 | HR 76 | Ht 66.0 in | Wt 230.0 lb

## 2022-06-13 DIAGNOSIS — M533 Sacrococcygeal disorders, not elsewhere classified: Secondary | ICD-10-CM | POA: Diagnosis not present

## 2022-06-13 DIAGNOSIS — M4316 Spondylolisthesis, lumbar region: Secondary | ICD-10-CM | POA: Insufficient documentation

## 2022-06-13 NOTE — Patient Instructions (Signed)
Please avoid transitional movements during training sessions until you improve core strength

## 2022-06-13 NOTE — Progress Notes (Signed)
Subjective:    Patient ID: Tracey Rodriguez, female    DOB: 02/21/1960, 62 y.o.   MRN: [id number]  HPI Chief complaint low back and buttock pain 62 year old female with chronic sacroiliac pain, fibromyalgia and morbid obesity who is here today with complaints of or worsening chronic pain.  The patient states that she has had outpatient [facility] doing a combination of strengthening exercises as well as manual therapies.  She denies any pains going down the leg on a consistent basis. The patient states she had a fairly prolonged response of 90% pain relief with the sacroiliac injections, nerve blocks rather than intra-articular. She would like to try these again given her improved functional status following these injections. Pain Inventory Average Pain 9 Pain Right Now 7 My pain is constant, stabbing, and aching  In the last 24 hours, has pain interfered with the following? General activity 8 Relation with others 6 Enjoyment of life 5 What TIME of day is your pain at its worst? morning , daytime, evening, and night Sleep (in general) Fair  Pain is worse with: bending, sitting, inactivity, standing, and some activites Pain improves with: medication Relief from Meds: 4  Family History  Problem Relation Age of Onset   Breast cancer Mother 64   Ovarian cancer Mother    Heart disease Father 9   Heart attack Father    Clotting disorder Father    Rheumatologic disease Father    Crohn's disease Father    Esophageal cancer Brother    Heart attack Brother    Cancer Brother    Lung cancer Brother    Colon polyps Brother    Throat cancer Brother    Colon polyps Brother    Stroke Brother    Breast cancer Maternal Aunt 60   Esophageal cancer Maternal Uncle    Stroke Paternal Aunt    Heart disease Paternal Aunt    Kidney disease Paternal Uncle    Liver cancer Maternal Grandmother    Heart disease Maternal Grandfather    Lung cancer Maternal Grandfather     Irritable bowel syndrome Son    Colon cancer Neg Hx    Rectal cancer Neg Hx    Stomach cancer Neg Hx    Social History   Socioeconomic History   Marital status: Married    Spouse name: Not on file   Number of children: 2   Years of education: Not on file   Highest education level: Not on file  Occupational History   Not on file  Tobacco Use   Smoking status: Former    Packs/day: 3.00    Years: 22.00    Additional pack years: 0.00    Total pack years: 66.00    Types: Cigarettes    Quit date: 02/18/2000    Years since quitting: 22.3    Passive exposure: Never   Smokeless tobacco: Never  Vaping Use   Vaping Use: Never used  Substance and Sexual Activity   Alcohol use: Yes    Alcohol/week: 0.0 standard drinks of alcohol    Comment: RARELY   Drug use: Not Currently   Sexual activity: Yes    Birth control/protection: Post-menopausal  Other Topics Concern   Not on file  Social History Narrative   She is married, has children.  They are grown.  1 son and 1 daughter has at least 1 grandchild   Former smoker no significant alcohol no drug use   Social Determinants of Health  Financial Resource Strain: Not on file  Food Insecurity: Not on file  Transportation Needs: Not on file  Physical Activity: Not on file  Stress: Not on file  Social Connections: Not on file   Past Surgical History:  Procedure Laterality Date   ABLATION     ANTERIOR (CYSTOCELE) AND POSTERIOR REPAIR (RECTOCELE) WITH XENFORM GRAFT AND SACROSPINOUS FIXATION  09/10/2020   APPENDECTOMY     BILATERAL SALPINGOOPHORECTOMY  09/10/2020   BREAST EXCISIONAL BIOPSY Left    BREAST SURGERY     left- lumpectomy   DILATATION & CURRETTAGE/HYSTEROSCOPY WITH RESECTOCOPE N/A 04/05/2012   Procedure: hysteroscopy with endocervical curretting;  Surgeon: Philip Aspen, DO;  Location: [facility];  Service: Gynecology;  Laterality: N/A;   DILATION AND CURETTAGE OF UTERUS     EYE SURGERY Bilateral 10/2017   cataract  extraction    LIVER BIOPSY  09/2018   NOSE SURGERY     VAGINAL HYSTERECTOMY  09/10/2020   Past Surgical History:  Procedure Laterality Date   ABLATION     ANTERIOR (CYSTOCELE) AND POSTERIOR REPAIR (RECTOCELE) WITH XENFORM GRAFT AND SACROSPINOUS FIXATION  09/10/2020   APPENDECTOMY     BILATERAL SALPINGOOPHORECTOMY  09/10/2020   BREAST EXCISIONAL BIOPSY Left    BREAST SURGERY     left- lumpectomy   DILATATION & CURRETTAGE/HYSTEROSCOPY WITH RESECTOCOPE N/A 04/05/2012   Procedure: hysteroscopy with endocervical curretting;  Surgeon: Philip Aspen, DO;  Location: [facility];  Service: Gynecology;  Laterality: N/A;   DILATION AND CURETTAGE OF UTERUS     EYE SURGERY Bilateral 10/2017   cataract extraction    LIVER BIOPSY  09/2018   NOSE SURGERY     VAGINAL HYSTERECTOMY  09/10/2020   Past Medical History:  Diagnosis Date   Arthritis    deg disc disease  - lower back, neck   Cancer (HCC)    left breast surgery-lumpectomy   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 (HCC)    Fibromyalgia    GERD (gastroesophageal reflux disease)    Heart murmur    Hx of adenomatous polyp of colon 02/04/2018   Hyperlipidemia    Hypothyroidism    goiter   NAFLD (nonalcoholic fatty liver disease)    Neuromuscular disorder (HCC)    sciatic nerve   PONV (postoperative nausea and vomiting)    Rectocele 01/27/2018   SVD (spontaneous vaginal delivery)    x 2   BP (!) 151/82   Pulse 76   Ht 5\' 6"  (1.676 m)   Wt 230 lb (104.3 kg)   SpO2 93%   BMI 37.12 kg/m   Opioid Risk Score:   Fall Risk Score:  `1  Depression screen [facility] 2/9     06/13/2022    9:39 AM 10/24/2021    9:29 AM 09/05/2021   11:44 AM 07/16/2021    3:04 PM 05/03/2021    9:44 AM 03/07/2021    2:35 PM 01/08/2021   10:10 AM  Depression screen PHQ 2/9  Decreased Interest 0 0 0 0 0 0 0  Down, Depressed, Hopeless 0 0 0 0 0 0 0  PHQ - 2 Score 0 0 0 0 0 0 0  Altered sleeping       2  Tired, decreased energy       2  Change in  appetite       0  Feeling bad or failure about yourself        0  Trouble concentrating  0  Moving slowly or fidgety/restless       0  Suicidal thoughts       0  PHQ-9 Score       4  Difficult doing work/chores       Somewhat difficult     Review of Systems  Musculoskeletal:  Positive for back pain.       Hip pain Bilateral leg pain   All other systems reviewed and are negative.     Objective:   Physical Exam  Obese female no acute distress  Sacral thrust (prone) : Positive bilateral Lateral compression: Negative FABER's: Positive bilateral Distraction (supine): Positive bilateral Thigh thrust test: Negative  Straight leg raising test is negative bilaterally Lumbar range of motion 50% flexion extension lateral bending and rotation. Ambulates without assistive device no evidence of toe drag or knee instability Mood and affect are appropriate Motor strength is 5/5 bilateral hip flexor knee extensor ankle dorsiflexors       Assessment & Plan:  1.  Sacroiliac pain which was improved with sacroiliac nerve blocks resulting in prolonged pain relief with improvement in functional status.  Would recommend repeating bilateral L 5 dorsal ramus S1 , 2 , 3 lateral branch blocks  #2.  Radicular pain improved with ongoing physical therapy likely due to L5-S1 spondylolisthesis with foraminal stenosis.

## 2022-06-16 DIAGNOSIS — R7303 Prediabetes: Secondary | ICD-10-CM | POA: Diagnosis not present

## 2022-06-16 DIAGNOSIS — E038 Other specified hypothyroidism: Secondary | ICD-10-CM | POA: Diagnosis not present

## 2022-06-16 DIAGNOSIS — E782 Mixed hyperlipidemia: Secondary | ICD-10-CM | POA: Diagnosis not present

## 2022-06-16 DIAGNOSIS — K760 Fatty (change of) liver, not elsewhere classified: Secondary | ICD-10-CM | POA: Diagnosis not present

## 2022-06-17 DIAGNOSIS — M5030 Other cervical disc degeneration, unspecified cervical region: Secondary | ICD-10-CM | POA: Diagnosis not present

## 2022-06-17 DIAGNOSIS — M25561 Pain in right knee: Secondary | ICD-10-CM | POA: Diagnosis not present

## 2022-06-17 DIAGNOSIS — M797 Fibromyalgia: Secondary | ICD-10-CM | POA: Diagnosis not present

## 2022-06-17 DIAGNOSIS — M533 Sacrococcygeal disorders, not elsewhere classified: Secondary | ICD-10-CM | POA: Diagnosis not present

## 2022-06-17 DIAGNOSIS — M5136 Other intervertebral disc degeneration, lumbar region: Secondary | ICD-10-CM | POA: Diagnosis not present

## 2022-06-19 DIAGNOSIS — M533 Sacrococcygeal disorders, not elsewhere classified: Secondary | ICD-10-CM | POA: Diagnosis not present

## 2022-06-19 DIAGNOSIS — M797 Fibromyalgia: Secondary | ICD-10-CM | POA: Diagnosis not present

## 2022-06-19 DIAGNOSIS — M5136 Other intervertebral disc degeneration, lumbar region: Secondary | ICD-10-CM | POA: Diagnosis not present

## 2022-06-19 DIAGNOSIS — M5030 Other cervical disc degeneration, unspecified cervical region: Secondary | ICD-10-CM | POA: Diagnosis not present

## 2022-06-21 ENCOUNTER — Other Ambulatory Visit: Payer: Self-pay | Admitting: Physical Medicine & Rehabilitation

## 2022-06-23 DIAGNOSIS — M25561 Pain in right knee: Secondary | ICD-10-CM | POA: Diagnosis not present

## 2022-06-26 DIAGNOSIS — M5136 Other intervertebral disc degeneration, lumbar region: Secondary | ICD-10-CM | POA: Diagnosis not present

## 2022-06-26 DIAGNOSIS — M797 Fibromyalgia: Secondary | ICD-10-CM | POA: Diagnosis not present

## 2022-06-26 DIAGNOSIS — M5030 Other cervical disc degeneration, unspecified cervical region: Secondary | ICD-10-CM | POA: Diagnosis not present

## 2022-06-26 DIAGNOSIS — M533 Sacrococcygeal disorders, not elsewhere classified: Secondary | ICD-10-CM | POA: Diagnosis not present

## 2022-07-01 DIAGNOSIS — M5136 Other intervertebral disc degeneration, lumbar region: Secondary | ICD-10-CM | POA: Diagnosis not present

## 2022-07-01 DIAGNOSIS — M797 Fibromyalgia: Secondary | ICD-10-CM | POA: Diagnosis not present

## 2022-07-01 DIAGNOSIS — M533 Sacrococcygeal disorders, not elsewhere classified: Secondary | ICD-10-CM | POA: Diagnosis not present

## 2022-07-01 DIAGNOSIS — M5030 Other cervical disc degeneration, unspecified cervical region: Secondary | ICD-10-CM | POA: Diagnosis not present

## 2022-07-03 ENCOUNTER — Encounter
Payer: BC Managed Care – PPO | Attending: Physical Medicine & Rehabilitation | Admitting: Physical Medicine & Rehabilitation

## 2022-07-03 ENCOUNTER — Ambulatory Visit (INDEPENDENT_AMBULATORY_CARE_PROVIDER_SITE_OTHER)
Admission: RE | Admit: 2022-07-03 | Discharge: 2022-07-03 | Disposition: A | Discharge status: Home / Self Care | Payer: BC Managed Care – PPO | Source: Ambulatory Visit | Attending: Internal Medicine | Admitting: Internal Medicine

## 2022-07-03 ENCOUNTER — Encounter: Payer: Self-pay | Admitting: Physical Medicine & Rehabilitation

## 2022-07-03 VITALS — BP 133/81 | HR 78 | Ht 66.0 in | Wt 230.0 lb

## 2022-07-03 DIAGNOSIS — M79604 Pain in right leg: Secondary | ICD-10-CM

## 2022-07-03 DIAGNOSIS — M533 Sacrococcygeal disorders, not elsewhere classified: Secondary | ICD-10-CM | POA: Insufficient documentation

## 2022-07-03 NOTE — Progress Notes (Signed)
Subjective:    Patient ID: Tracey Rodriguez, female    DOB: February 22, 1960, 62 y.o.   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 General activity 5 Relation with others 0 Enjoyment of life 9 What TIME of day is your pain at its worst? morning , daytime, evening, and night Sleep (in general) Fair  Pain is worse with: walking, standing, and some activites Pain improves with:  nothing Relief from Meds:  no pain medication taken  Family History  Problem Relation Age of Onset   Breast cancer Mother 69   Ovarian cancer Mother    Heart disease Father 58   Heart attack Father    Clotting disorder Father    Rheumatologic disease Father    Crohn's disease Father    Esophageal cancer Brother    Heart attack Brother    Cancer Brother    Lung cancer Brother    Colon polyps Brother    Throat cancer Brother    Colon polyps Brother    Stroke Brother    Breast cancer Maternal Aunt 60   Esophageal cancer Maternal Uncle    Stroke Paternal Aunt    Heart disease Paternal Aunt    Kidney disease Paternal Uncle    Liver cancer Maternal Grandmother    Heart disease Maternal Grandfather    Lung  cancer Maternal Grandfather    Irritable bowel syndrome Son    Colon cancer Neg Hx    Rectal cancer Neg Hx    Stomach cancer Neg Hx    Social History   Socioeconomic History   Marital status: Married    Spouse name: Not on file   Number of children: 2   Years of education: Not on file   Highest education level: Not on file  Occupational History   Not on file  Tobacco Use   Smoking status: Former    Packs/day: 3.00    Years: 22.00    Additional pack years: 0.00    Total pack years: 66.00    Types: Cigarettes    Quit date: 02/18/2000    Years since quitting: 22.3    Passive exposure: Never   Smokeless tobacco: Never  Vaping Use   Vaping Use: Never used  Substance and Sexual Activity   Alcohol use: Yes    Alcohol/week: 0.0 standard drinks of alcohol    Comment: RARELY   Drug use: Not Currently   Sexual activity: Yes    Birth control/protection: Post-menopausal  Other Topics Concern   Not on file  Social History Narrative   She is married, has children.  They are grown.  1 son and 1 daughter has at least 1 grandchild   Former smoker no significant alcohol no drug use   Social Determinants of Corporate investment banker Strain: Not on file  Food Insecurity: Not on file  Transportation Needs: Not on file  Physical Activity: Not on file  Stress: Not on file  Social Connections: Not on file   Past Surgical History:  Procedure Laterality Date   ABLATION     ANTERIOR (CYSTOCELE) AND POSTERIOR REPAIR (RECTOCELE) WITH XENFORM GRAFT AND SACROSPINOUS FIXATION  09/10/2020   APPENDECTOMY     BILATERAL SALPINGOOPHORECTOMY  09/10/2020   BREAST EXCISIONAL BIOPSY Left    BREAST SURGERY     left- lumpectomy   DILATATION & CURRETTAGE/HYSTEROSCOPY WITH RESECTOCOPE N/A 04/05/2012   Procedure: hysteroscopy with endocervical curretting;  Surgeon: Philip Aspen, DO;  Location: [facility];  Service: Gynecology;  Laterality: N/A;   DILATION AND CURETTAGE OF UTERUS     EYE SURGERY  Bilateral 10/2017   cataract extraction    LIVER BIOPSY  09/2018   NOSE SURGERY     VAGINAL HYSTERECTOMY  09/10/2020   Past Surgical History:  Procedure Laterality Date   ABLATION     ANTERIOR (CYSTOCELE) AND POSTERIOR REPAIR (RECTOCELE) WITH XENFORM GRAFT AND SACROSPINOUS FIXATION  09/10/2020   APPENDECTOMY     BILATERAL SALPINGOOPHORECTOMY  09/10/2020   BREAST EXCISIONAL BIOPSY Left    BREAST SURGERY     left- lumpectomy   DILATATION & CURRETTAGE/HYSTEROSCOPY WITH RESECTOCOPE N/A 04/05/2012   Procedure: hysteroscopy with endocervical curretting;  Surgeon: Philip Aspen, DO;  Location: [facility];  Service: Gynecology;  Laterality: N/A;   DILATION AND CURETTAGE OF UTERUS     EYE SURGERY Bilateral 10/2017   cataract extraction    LIVER BIOPSY  09/2018   NOSE SURGERY     VAGINAL HYSTERECTOMY  09/10/2020   Past Medical History:  Diagnosis Date   Arthritis    deg disc disease  - lower back, neck   Cancer (HCC)    left breast surgery-lumpectomy   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 (HCC)    Fibromyalgia    GERD (gastroesophageal reflux disease)    Heart murmur    Hx of adenomatous polyp of colon 02/04/2018   Hyperlipidemia    Hypothyroidism    goiter   NAFLD (nonalcoholic fatty liver disease)    Neuromuscular disorder (HCC)    sciatic nerve   PONV (postoperative nausea and vomiting)    Rectocele 01/27/2018   SVD (spontaneous vaginal delivery)    x 2   There were no vitals taken for this visit.  Opioid Risk Score:   Fall Risk Score:  `1  Depression screen [facility] 2/9     07/03/2022   10:32 AM 06/13/2022    9:39 AM 10/24/2021    9:29 AM 09/05/2021   11:44 AM 07/16/2021    3:04 PM 05/03/2021    9:44 AM 03/07/2021    2:35 PM  Depression screen PHQ 2/9  Decreased Interest 0 0 0 0 0 0 0  Down, Depressed, Hopeless 0 0 0 0 0 0 0  PHQ - 2 Score 0 0 0 0 0 0 0    Review of Systems  Musculoskeletal:  Positive for back pain.       Right knee pain      Objective:    Physical Exam Constitutional:      Appearance: Normal appearance. She is obese.  HENT:  Head: Normocephalic and atraumatic.  Eyes:     Extraocular Movements: Extraocular movements intact.     Conjunctiva/sclera: Conjunctivae normal.     Pupils: Pupils are equal, round, and reactive to light.  Musculoskeletal:     Right hip: Normal.     Right knee: Effusion present. Normal range of motion. Tenderness present over the medial joint line. No lateral joint line or patellar tendon tenderness. Normal alignment.     Left knee: Tenderness present over the medial joint line.  Skin:    General: Skin is warm and dry.  Neurological:     General: No focal deficit present.     Mental Status: She is oriented to person, place, and time.   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  Will order MRI Right knee Groin and medial thigh pain, hip exam negative, will order DVU RLE

## 2022-07-04 ENCOUNTER — Telehealth: Payer: Self-pay | Admitting: *Deleted

## 2022-07-04 ENCOUNTER — Encounter: Payer: Self-pay | Admitting: *Deleted

## 2022-07-04 DIAGNOSIS — Z713 Dietary counseling and surveillance: Secondary | ICD-10-CM | POA: Diagnosis not present

## 2022-07-04 DIAGNOSIS — E669 Obesity, unspecified: Secondary | ICD-10-CM | POA: Diagnosis not present

## 2022-07-04 NOTE — Telephone Encounter (Addendum)
Notified Tracey Rodriguez by name identified voicemail and through [employer].  ----- Message from Erick Colace, MD sent at 07/04/2022 10:58 AM EDT ----- Please inform pt of normal result.  Ok to fly on Monday  ----- Message ----- From: Rosiland Oz, RVT, RDCS Sent: 07/03/2022   3:21 PM EDT To: Erick Colace, MD  Right lower extremity negative for deep or superficial venous thrombus.

## 2022-07-10 DIAGNOSIS — M5030 Other cervical disc degeneration, unspecified cervical region: Secondary | ICD-10-CM | POA: Diagnosis not present

## 2022-07-10 DIAGNOSIS — M533 Sacrococcygeal disorders, not elsewhere classified: Secondary | ICD-10-CM | POA: Diagnosis not present

## 2022-07-10 DIAGNOSIS — M797 Fibromyalgia: Secondary | ICD-10-CM | POA: Diagnosis not present

## 2022-07-10 DIAGNOSIS — M5136 Other intervertebral disc degeneration, lumbar region: Secondary | ICD-10-CM | POA: Diagnosis not present

## 2022-07-11 DIAGNOSIS — E038 Other specified hypothyroidism: Secondary | ICD-10-CM | POA: Diagnosis not present

## 2022-07-11 DIAGNOSIS — E669 Obesity, unspecified: Secondary | ICD-10-CM | POA: Diagnosis not present

## 2022-07-11 DIAGNOSIS — K760 Fatty (change of) liver, not elsewhere classified: Secondary | ICD-10-CM | POA: Diagnosis not present

## 2022-07-11 DIAGNOSIS — R7303 Prediabetes: Secondary | ICD-10-CM | POA: Diagnosis not present

## 2022-07-12 ENCOUNTER — Ambulatory Visit
Admission: RE | Admit: 2022-07-12 | Discharge: 2022-07-12 | Disposition: A | Discharge status: Home / Self Care | Payer: BC Managed Care – PPO | Source: Ambulatory Visit | Attending: Physical Medicine & Rehabilitation | Admitting: Physical Medicine & Rehabilitation

## 2022-07-12 DIAGNOSIS — M79604 Pain in right leg: Secondary | ICD-10-CM

## 2022-07-12 DIAGNOSIS — S83241A Other tear of medial meniscus, current injury, right knee, initial encounter: Secondary | ICD-10-CM | POA: Diagnosis not present

## 2022-07-17 DIAGNOSIS — M533 Sacrococcygeal disorders, not elsewhere classified: Secondary | ICD-10-CM | POA: Diagnosis not present

## 2022-07-17 DIAGNOSIS — M797 Fibromyalgia: Secondary | ICD-10-CM | POA: Diagnosis not present

## 2022-07-17 DIAGNOSIS — M5136 Other intervertebral disc degeneration, lumbar region: Secondary | ICD-10-CM | POA: Diagnosis not present

## 2022-07-17 DIAGNOSIS — M5030 Other cervical disc degeneration, unspecified cervical region: Secondary | ICD-10-CM | POA: Diagnosis not present

## 2022-07-18 ENCOUNTER — Encounter: Payer: Self-pay | Admitting: Physical Medicine & Rehabilitation

## 2022-07-18 ENCOUNTER — Encounter: Payer: BC Managed Care – PPO | Admitting: Physical Medicine & Rehabilitation

## 2022-07-18 DIAGNOSIS — M533 Sacrococcygeal disorders, not elsewhere classified: Secondary | ICD-10-CM | POA: Diagnosis not present

## 2022-07-18 DIAGNOSIS — M79604 Pain in right leg: Secondary | ICD-10-CM | POA: Diagnosis not present

## 2022-07-18 MED ORDER — LIDOCAINE HCL 1 % IJ SOLN
8.0000 mL | Freq: Once | INTRAMUSCULAR | Status: AC
Start: 2022-07-18 — End: 2022-07-18
  Administered 2022-07-18: 8 mL

## 2022-07-18 MED ORDER — LIDOCAINE HCL (PF) 2 % IJ SOLN
4.0000 mL | Freq: Once | INTRAMUSCULAR | Status: AC
Start: 2022-07-18 — End: 2022-07-18
  Administered 2022-07-18: 4 mL

## 2022-07-18 MED ORDER — IOHEXOL 180 MG/ML  SOLN
6.0000 mL | Freq: Once | INTRAMUSCULAR | Status: AC
  Administered 2022-07-18: 6 mL

## 2022-07-18 NOTE — Patient Instructions (Signed)
Sacroiliac nerve block injections performed today.  The injection was done under x-ray guidance with contrast enhancement. This procedure has been performed to help reduce low back and buttocks pain as well as potentially hip pain. The duration of this injection is variable lasting from hours to  weeks

## 2022-07-18 NOTE — Progress Notes (Signed)
  PROCEDURE RECORD  Physical Medicine and Rehabilitation   Name: Tracey Rodriguez DOB:15-Oct-1960 MRN: [id number]  Date:07/18/2022  Physician: Claudette Laws, MD    Nurse/CMA: Nedra Hai, CMA  Allergies:  Allergies  Allergen Reactions   Amlodipine Itching    Tinnie Kunin Anaja Monts Dream Harman   Capsaicin Anaphylaxis    Pt not sure of name    Etodolac Shortness Of Breath    REACTION: dyspnea/tightness in chest Other reaction(s): shortness of breath Other reaction(s): shortness of breath Other reaction(s): shortness of breath Other reaction(s): shortness of breath Other reaction(s): shortness of breath   Sulfa Antibiotics Nausea And Vomiting    Other reaction(s): Unknown Other reaction(s): Unknown Other reaction(s): Unknown Other reaction(s): Unknown   Solifenacin Rash    Other reaction(s): rash Other reaction(s): rash Other reaction(s): rash Other reaction(s): rash   Solifenacin Succinate Rash   Bee Venom    Pepto-Bismol [Bismuth] Nausea And Vomiting   Pork-Derived Products Nausea And Vomiting    Other reaction(s): vomiting Other reaction(s): vomiting Other reaction(s): vomiting   Rosuvastatin Itching    All statins  Other reaction(s): itching Other reaction(s): itching Other reaction(s): itching Other reaction(s): itching   Sulfonamide Derivatives Nausea And Vomiting    REACTION: vomiting    Consent Signed: Yes.    Is patient diabetic? No.  CBG today? .  Pregnant: No. LMP: No LMP recorded. Patient is postmenopausal. (age 68-55)  Anticoagulants: no Anti-inflammatory: yes (Aleve) Antibiotics: no  Procedure:Bilateral Sacroiliac Nerve Block  Position: Prone Start Time: 9:49 am  End Time: 10:15 am  Fluoro Time: 1:31  RN/CMA Nedra Hai, CMA Nikoli Nasser, CMA    Time 9:27 am 10:25 am    BP 100/68 150/82    Pulse 78 78    Respirations 16 16    O2 Sat 95 97    S/S 6 6    Pain Level 4/10 0/10     D/C home with alone, patient A & O X 3, D/C instructions reviewed, and sits  independently.

## 2022-07-18 NOTE — Progress Notes (Signed)
L5 dorsal ramus S1-S2-S3 lateral branch blocks under fluoroscopic guidance Bilateral   Informed consent was obtained after describing risks and benefits of the procedure with patient these include bleeding bruising and infection.  He elects to proceed and has given written consent.  Patient placed prone on fluoroscopy table Betadine prep sterile drape a 25-gauge 1.5 inch needle was used to anesthetize skin and subcu tissue with 1% lidocaine 1 cc into each of 4 sites.  Then a 22-gauge 3.5" needle was inserted under fluoroscopic guidance for starting the S1 SAP sacral ala junction.  Bone contact made.  Omnipaque 180 x 0.5 mL demonstrated no intravascular uptake then 0.5 mL of 2% lidocaine was injected.  Then the lateral aspect of the S1, S2, S3 foramen was targeted.  Bone contact made out, Omnipaque 180 times 0.5 mL demonstrated no nerve root or intravascular uptake ( except at bilateral S1 which required needle repositioning) within 0.5 mL of 2% lidocaine solution was injected after negative drawback for blood.THis same procedure was repeated on Right side using same needle and injectate   Patient tolerated procedure well.  Postinjection instructions given.

## 2022-07-21 ENCOUNTER — Other Ambulatory Visit: Payer: BC Managed Care – PPO

## 2022-08-01 ENCOUNTER — Ambulatory Visit: Payer: BC Managed Care – PPO | Admitting: Physical Medicine & Rehabilitation

## 2022-08-03 ENCOUNTER — Other Ambulatory Visit: Payer: Self-pay | Admitting: Internal Medicine

## 2022-08-03 DIAGNOSIS — K219 Gastro-esophageal reflux disease without esophagitis: Secondary | ICD-10-CM

## 2022-08-07 DIAGNOSIS — M533 Sacrococcygeal disorders, not elsewhere classified: Secondary | ICD-10-CM | POA: Diagnosis not present

## 2022-08-07 DIAGNOSIS — M797 Fibromyalgia: Secondary | ICD-10-CM | POA: Diagnosis not present

## 2022-08-07 DIAGNOSIS — M5030 Other cervical disc degeneration, unspecified cervical region: Secondary | ICD-10-CM | POA: Diagnosis not present

## 2022-08-07 DIAGNOSIS — M5136 Other intervertebral disc degeneration, lumbar region: Secondary | ICD-10-CM | POA: Diagnosis not present

## 2022-08-08 DIAGNOSIS — I10 Essential (primary) hypertension: Secondary | ICD-10-CM | POA: Diagnosis not present

## 2022-08-08 DIAGNOSIS — R0609 Other forms of dyspnea: Secondary | ICD-10-CM | POA: Diagnosis not present

## 2022-08-08 DIAGNOSIS — R739 Hyperglycemia, unspecified: Secondary | ICD-10-CM | POA: Diagnosis not present

## 2022-08-08 DIAGNOSIS — E039 Hypothyroidism, unspecified: Secondary | ICD-10-CM | POA: Diagnosis not present

## 2022-08-08 LAB — LAB REPORT - SCANNED
A1c: 5.6
EGFR: 76

## 2022-08-13 NOTE — Progress Notes (Unsigned)
Cardiology Office Note   Date:  08/14/2022   ID:  Tracey Rodriguez, DOB 11/06/1960, MRN [id number]  PCP:  Henrine Screws, MD    No chief complaint on file.  Hyperlipidemia  Wt Readings from Last 3 Encounters:  08/14/22 216 lb 12.8 oz (98.3 kg)  07/03/22 230 lb (104.3 kg)  06/13/22 230 lb (104.3 kg)       History of Present Illness: Tracey Rodriguez is a 62 y.o. female  Who has hyperlipidemia, and family history of premature coronary artery disease.  She had carpal tunnel surgery in 10/16.  After the surgery, she had a squeezing feeling in both arms.  She had several episodes of this in December.  It happened again early 2017.     In Feb 2017, she had chest pain with arm pain with presyncope and dyspnea.  She did not fully pass out.  She was at [employer] at the time.     In 2017, she had a cardiac eval with Dr. Jacinto Halim.  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  She had a nuclear stress test that was negative for ischemia.     When wearing the monitor, she had some fatigue and this correlated to allow BP.   I saw her in 2017.  At that time, she had intermittent dizziness for which we encouraged her to stay well-hydrated.  She was drinking a lot of coffee at that time.  She was not sleeping well.   We also discussed her hyperlipidemia.  She had reported difficulty affording red yeast rice.  She had itching with Crestor.  She had memory issues with simvastatin.  We discussed trying atorvastatin but she preferred trying red yeast rice.   She saw Boyce Medici in August 2020.  She was referred to lipid clinic due to statin intolerance.  Metoprolol was added for blood pressure.  2D echo was done showing normal LVEF with mild diastolic dysfunction.  Coronary CTA showed no CAD and a calcium score of 0.   In the lipid clinic, the patient was offered PCSK9 versus Zetia versus bempedoic acid but she declined.  She ultimately ended up trying medium  dose Lipitor and tolerated this for some time.   She had been started been on amlodipine for HTN and developed some swelling in her legs.  It was stopped.   Atorvastatin was stopped as she attributed knee pain to this medicine.    Recently in 2024, she has had fatigue and nausea.  She has felt sleepy at times, while driving and after walking.  She reports that her most recent sleep study was negative for sleep apnea  Denies : Chest pain. Leg edema. Nitroglycerin use. Orthopnea. Paroxysmal nocturnal dyspnea.  Syncope.     Past Medical History:  Diagnosis Date   Arthritis    deg disc disease  - lower back, neck   Cancer (HCC)    left breast surgery-lumpectomy   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 (HCC)    Fibromyalgia    GERD (gastroesophageal reflux disease)    Heart murmur    Hx of adenomatous polyp of colon 02/04/2018   Hyperlipidemia    Hypothyroidism    goiter   NAFLD (nonalcoholic fatty liver disease)    Neuromuscular disorder (HCC)    sciatic nerve   PONV (postoperative nausea and vomiting)    Rectocele 01/27/2018   SVD (spontaneous vaginal delivery)  x 2    Past Surgical History:  Procedure Laterality Date   ABLATION     ANTERIOR (CYSTOCELE) AND POSTERIOR REPAIR (RECTOCELE) WITH XENFORM GRAFT AND SACROSPINOUS FIXATION  09/10/2020   APPENDECTOMY     BILATERAL SALPINGOOPHORECTOMY  09/10/2020   BREAST EXCISIONAL BIOPSY Left    BREAST SURGERY     left- lumpectomy   DILATATION & CURRETTAGE/HYSTEROSCOPY WITH RESECTOCOPE N/A 04/05/2012   Procedure: hysteroscopy with endocervical curretting;  Surgeon: Philip Aspen, DO;  Location: [facility];  Service: Gynecology;  Laterality: N/A;   DILATION AND CURETTAGE OF UTERUS     EYE SURGERY Bilateral 10/2017   cataract extraction    LIVER BIOPSY  09/2018   NOSE SURGERY     VAGINAL HYSTERECTOMY  09/10/2020     Current Outpatient Medications  Medication Sig Dispense Refill   ascorbic acid (VITAMIN C) 500 MG  tablet Take by mouth.     B Complex Vitamins (VITAMIN B-COMPLEX) TABS Take 1 tablet by mouth daily.     cetirizine (ZYRTEC) 10 MG tablet Take by mouth.     dexlansoprazole (DEXILANT) 60 MG capsule TAKE 1 CAPSULE (60 MG TOTAL) BY MOUTH DAILY. TAKE 30 MINUTES BEFORE BREAKFAST 90 capsule 1   fluorouracil (EFUDEX) 5 % cream Apply topically.     fluticasone (FLONASE) 50 MCG/ACT nasal spray Place into the nose.     furosemide (LASIX) 20 MG tablet TAKE 2 TABLETS (40 MG TOTAL) BY MOUTH DAILY AS NEEDED FOR FLUID OR EDEMA. 180 tablet 3   hydrocortisone 2.5 % ointment hydrocortisone 2.5 % topical ointment     hydroquinone 4 % cream at bedtime.     hypromellose (GENTEAL) 0.3 % GEL ophthalmic ointment Apply to eye.     levothyroxine (SYNTHROID) 75 MCG tablet Take by mouth.     MAGNESIUM MALATE PO Take by mouth.     metFORMIN (GLUCOPHAGE-XR) 500 MG 24 hr tablet Start once daily and increase to twice daily after 2 weeks     methocarbamol (ROBAXIN) 500 MG tablet Take by mouth.     metoprolol succinate (TOPROL-XL) 50 MG 24 hr tablet Take by mouth.     Multiple Vitamins-Minerals (ONE-A-DAY WOMENS 50 PLUS) TABS Take 1 tablet by mouth daily.     mupirocin ointment (BACTROBAN) 2 % Apply topically.     Polyethyl Glycol-Propyl Glycol (SYSTANE OP) Apply to eye.     pregabalin (LYRICA) 50 MG capsule TAKE 1 CAPSULE BY MOUTH AT BEDTIME. 30 capsule 1   scopolamine (TRANSDERM-SCOP) 1 MG/3DAYS Transderm-Scop 1.5 mg transdermal patch (1 mg over 3 days)     tirzepatide (ZEPBOUND) 5 MG/0.5ML Pen Inject 5 mg into the skin once a week.     triamcinolone cream (KENALOG) 0.1 % triamcinolone acetonide 0.1 % topical cream     Vibegron (GEMTESA) 75 MG TABS Take by mouth.     levothyroxine (SYNTHROID) 50 MCG tablet 1 tablet in the morning on an empty stomach Orally Once a day for 90 days (Patient not taking: Reported on 08/14/2022)     No current facility-administered medications for this visit.    Allergies:   Amlodipine,  Capsaicin, Etodolac, Sulfa antibiotics, Solifenacin, Solifenacin succinate, Bee venom, Pepto-bismol [bismuth], Pork-derived products, Rosuvastatin, and Sulfonamide derivatives    Social History:  The patient  reports that she quit smoking about 22 years ago. Her smoking use included cigarettes. She has a 66.00 pack-year smoking history. She has never been exposed to tobacco smoke. She has never used smokeless tobacco. She reports current alcohol  use. She reports that she does not currently use drugs.   Family History:  The patient's family history includes Breast cancer (age of onset: 25) in her maternal aunt; Breast cancer (age of onset: 59) in her mother; Cancer in her brother; Clotting disorder in her father; Colon polyps in her brother and brother; Crohn's disease in her father; Esophageal cancer in her brother and maternal uncle; Heart attack in her brother and father; Heart disease in her maternal grandfather and paternal aunt; Heart disease (age of onset: 25) in her father; Irritable bowel syndrome in her son; Kidney disease in her paternal uncle; Liver cancer in her maternal grandmother; Lung cancer in her brother and maternal grandfather; Ovarian cancer in her mother; Rheumatologic disease in her father; Stroke in her brother and paternal aunt; Throat cancer in her brother.    ROS:  Please see the history of present illness.   Otherwise, review of systems are positive for fatigue.   All other systems are reviewed and negative.    PHYSICAL EXAM: VS:  BP 134/76   Pulse 79   Ht 5\' 6"  (1.676 m)   Wt 216 lb 12.8 oz (98.3 kg)   SpO2 98%   BMI 34.99 kg/m  , BMI Body mass index is 34.99 kg/m. GEN: Well nourished, well developed, in no acute distress HEENT: normal Neck: no JVD, carotid bruits, or masses Cardiac: RRR; no murmurs, rubs, or gallops,no edema  Respiratory:  clear to auscultation bilaterally, normal work of breathing GI: soft, nontender, nondistended, + BS MS: no deformity or  atrophy Skin: warm and dry, no rash Neuro:  Strength and sensation are intact Psych: euthymic mood, full affect   EKG:   The ekg ordered today demonstrates NSR, no ST changes   Recent Labs: 04/29/2022: Hemoglobin 13.8; Platelets 385.0   Lipid Panel No results found for: "CHOL", "TRIG", "HDL", "CHOLHDL", "VLDL", "LDLCALC", "LDLDIRECT"   Other studies Reviewed: Additional studies/ records that were reviewed today with results demonstrating: she reports a negative sleep study.   ASSESSMENT AND PLAN:  Fatigue: nonspecific sx. symptoms have improved somewhat lately.  We discussed cardiac testing with a monitor or echocardiogram.  For now, we will hold off.  If symptoms worsen again, would plan for 2 weeks Zio patch.  PACs noted on a rhythm strip that she brought.  Rhythm was regular during the exam today. Hyperlipidemia: LDL 162 in 2023. Had knee pain in the past that was attributed to statin, but the knee persisted after stopping the statin.  Hypothyroid: Dose increased.  Needs TSH with PMD.  Hypertension: The current medical regimen is effective;  continue present plan and medications. Family history of premature CAD: Healthy lifestyle discussed.  Exercise target noted below. Obesity: Weight loss will be beneficial for her overall health. Chronic pain: Noted in 2022 when she was trying to decrease narcotic use.   Current medicines are reviewed at length with the patient today.  The patient concerns regarding her medicines were addressed.  The following changes have been made:  No change  Labs/ tests ordered today include:  No orders of the defined types were placed in this encounter.   Recommend 150 minutes/week of aerobic exercise Low fat, low carb, high fiber diet recommended  Disposition:   FU in 1 year   Signed, Lance Muss, MD  08/14/2022 4:42 PM    [facility] [address] [place], [place], [place]  [address] Phone: [phone]; Fax: [phone]

## 2022-08-14 ENCOUNTER — Encounter: Payer: Self-pay | Admitting: Interventional Cardiology

## 2022-08-14 ENCOUNTER — Ambulatory Visit: Payer: BC Managed Care – PPO | Attending: Interventional Cardiology | Admitting: Interventional Cardiology

## 2022-08-14 VITALS — BP 134/76 | HR 79 | Ht 66.0 in | Wt 216.8 lb

## 2022-08-14 DIAGNOSIS — E782 Mixed hyperlipidemia: Secondary | ICD-10-CM | POA: Diagnosis not present

## 2022-08-14 DIAGNOSIS — Z8249 Family history of ischemic heart disease and other diseases of the circulatory system: Secondary | ICD-10-CM

## 2022-08-14 DIAGNOSIS — R0609 Other forms of dyspnea: Secondary | ICD-10-CM | POA: Diagnosis not present

## 2022-08-14 DIAGNOSIS — R5383 Other fatigue: Secondary | ICD-10-CM

## 2022-08-14 DIAGNOSIS — I10 Essential (primary) hypertension: Secondary | ICD-10-CM

## 2022-08-14 NOTE — Patient Instructions (Signed)
Medication Instructions:  Your physician recommends that you continue on your current medications as directed. Please refer to the Current Medication list given to you today.  *If you need a refill on your cardiac medications before your next appointment, please call your pharmacy*   Lab Work: none If you have labs (blood work) drawn today and your tests are completely normal, you will receive your results only by: MyChart Message (if you have MyChart) OR A paper copy in the mail If you have any lab test that is abnormal or we need to change your treatment, we will call you to review the results.   Testing/Procedures: none   Follow-Up: At [facility], you and your health needs are our priority.  As part of our continuing mission to provide you with exceptional heart care, we have created designated Provider Care Teams.  These Care Teams include your primary Cardiologist (physician) and Advanced Practice Providers (APPs -  Physician Assistants and Nurse Practitioners) who all work together to provide you with the care you need, when you need it.  We recommend signing up for the patient portal called "MyChart".  Sign up information is provided on this After Visit Summary.  MyChart is used to connect with patients for Virtual Visits (Telemedicine).  Patients are able to view lab/test results, encounter notes, upcoming appointments, etc.  Non-urgent messages can be sent to your provider as well.   To learn more about what you can do with MyChart, go to [email or web address].    Your next appointment:   12 month(s)  Provider:   Lance Muss, MD     Other Instructions  Call office if your symptoms return

## 2022-08-18 DIAGNOSIS — R7303 Prediabetes: Secondary | ICD-10-CM | POA: Diagnosis not present

## 2022-08-18 DIAGNOSIS — K760 Fatty (change of) liver, not elsewhere classified: Secondary | ICD-10-CM | POA: Diagnosis not present

## 2022-08-18 DIAGNOSIS — E669 Obesity, unspecified: Secondary | ICD-10-CM | POA: Diagnosis not present

## 2022-08-25 ENCOUNTER — Other Ambulatory Visit: Payer: Self-pay | Admitting: Physical Medicine & Rehabilitation

## 2022-08-29 ENCOUNTER — Encounter
Payer: BC Managed Care – PPO | Attending: Physical Medicine & Rehabilitation | Admitting: Physical Medicine & Rehabilitation

## 2022-08-29 ENCOUNTER — Encounter: Payer: Self-pay | Admitting: Physical Medicine & Rehabilitation

## 2022-08-29 VITALS — BP 128/81 | HR 70 | Ht 66.0 in | Wt 220.0 lb

## 2022-08-29 DIAGNOSIS — M533 Sacrococcygeal disorders, not elsewhere classified: Secondary | ICD-10-CM | POA: Diagnosis not present

## 2022-08-29 NOTE — Progress Notes (Signed)
Subjective:    Patient ID: Tracey Rodriguez, female    DOB: [birth year], 62 y.o.   MRN: [id number]  HPI 62 year old female with history of fibromyalgia, lumbar spondylosis as well as sacroiliac disorder who returns today for follow-up of her chronic low back pain.  She states that her pain used to be a 9 or 8 out of 10 or sometimes even 10 out of 10 in the low back.  Since the sacroiliac nerve blocks performed 07/18/2022.  Her pain is down to a moderate level and has been at this level for the last 6 weeks. Postinjection she did have some temporary right-sided perineal numbness that went away after a couple hours. She is back to doing all her usual activities.  She has been careful not to overdo it Pain Inventory Average Pain 5 Pain Right Now 5 My pain is intermittent, sharp, and aching  In the last 24 hours, has pain interfered with the following? General activity 4 Relation with others 0 Enjoyment of life 0 What TIME of day is your pain at its worst? morning  and night Sleep (in general) Fair  Pain is worse with: bending, sitting, inactivity, and standing Pain improves with: injections Relief from Meds: 9 with injections  Family History  Problem Relation Age of Onset   Breast cancer Mother 42   Ovarian cancer Mother    Heart disease Father 66   Heart attack Father    Clotting disorder Father    Rheumatologic disease Father    Crohn's disease Father    Esophageal cancer Brother    Heart attack Brother    Cancer Brother    Lung cancer Brother    Colon polyps Brother    Throat cancer Brother    Colon polyps Brother    Stroke Brother    Breast cancer Maternal Aunt 60   Esophageal cancer Maternal Uncle    Stroke Paternal Aunt    Heart disease Paternal Aunt    Kidney disease Paternal Uncle    Liver cancer Maternal Grandmother    Heart disease Maternal Grandfather    Lung cancer Maternal Grandfather    Irritable bowel syndrome Son    Colon cancer Neg Hx    Rectal cancer  Neg Hx    Stomach cancer Neg Hx    Social History   Socioeconomic History   Marital status: Married    Spouse name: Not on file   Number of children: 2   Years of education: Not on file   Highest education level: Not on file  Occupational History   Not on file  Tobacco Use   Smoking status: Former    Current packs/day: 0.00    Average packs/day: 3.0 packs/day for 22.0 years (66.0 ttl pk-yrs)    Types: Cigarettes    Start date: 02/17/1978    Quit date: 02/18/2000    Years since quitting: 22.5    Passive exposure: Never   Smokeless tobacco: Never  Vaping Use   Vaping status: Never Used  Substance and Sexual Activity   Alcohol use: Yes    Alcohol/week: 0.0 standard drinks of alcohol    Comment: RARELY   Drug use: Not Currently   Sexual activity: Yes    Birth control/protection: Post-menopausal  Other Topics Concern   Not on file  Social History Narrative   She is married, has children.  They are grown.  1 son and 1 daughter has at least 1 grandchild   Former smoker no significant alcohol  no drug use   Social Determinants of Health   Financial Resource Strain: Not on file  Food Insecurity: No Food Insecurity (03/05/2021)   Received from [facility]   Hunger Vital Sign    Worried About Running Out of Food in the Last Year: Never true    Ran Out of Food in the Last Year: Never true  Transportation Needs: Not on file  Physical Activity: Not on file  Stress: Not on file  Social Connections: Unknown (06/25/2021)   Received from [facility]   Social Network    Social Network: Not on file   Past Surgical History:  Procedure Laterality Date   ABLATION     ANTERIOR (CYSTOCELE) AND POSTERIOR REPAIR (RECTOCELE) WITH XENFORM GRAFT AND SACROSPINOUS FIXATION  09/10/2020   APPENDECTOMY     BILATERAL SALPINGOOPHORECTOMY  09/10/2020   BREAST EXCISIONAL BIOPSY Left    BREAST SURGERY     left- lumpectomy   DILATATION & CURRETTAGE/HYSTEROSCOPY WITH RESECTOCOPE N/A 04/05/2012    Procedure: hysteroscopy with endocervical curretting;  Surgeon: Philip Aspen, DO;  Location: [facility];  Service: Gynecology;  Laterality: N/A;   DILATION AND CURETTAGE OF UTERUS     EYE SURGERY Bilateral 10/2017   cataract extraction    LIVER BIOPSY  09/2018   NOSE SURGERY     VAGINAL HYSTERECTOMY  09/10/2020   Past Surgical History:  Procedure Laterality Date   ABLATION     ANTERIOR (CYSTOCELE) AND POSTERIOR REPAIR (RECTOCELE) WITH XENFORM GRAFT AND SACROSPINOUS FIXATION  09/10/2020   APPENDECTOMY     BILATERAL SALPINGOOPHORECTOMY  09/10/2020   BREAST EXCISIONAL BIOPSY Left    BREAST SURGERY     left- lumpectomy   DILATATION & CURRETTAGE/HYSTEROSCOPY WITH RESECTOCOPE N/A 04/05/2012   Procedure: hysteroscopy with endocervical curretting;  Surgeon: Philip Aspen, DO;  Location: [facility];  Service: Gynecology;  Laterality: N/A;   DILATION AND CURETTAGE OF UTERUS     EYE SURGERY Bilateral 10/2017   cataract extraction    LIVER BIOPSY  09/2018   NOSE SURGERY     VAGINAL HYSTERECTOMY  09/10/2020   Past Medical History:  Diagnosis Date   Arthritis    deg disc disease  - lower back, neck   Cancer (HCC)    left breast surgery-lumpectomy   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 (HCC)    Fibromyalgia    GERD (gastroesophageal reflux disease)    Heart murmur    Hx of adenomatous polyp of colon 02/04/2018   Hyperlipidemia    Hypothyroidism    goiter   NAFLD (nonalcoholic fatty liver disease)    Neuromuscular disorder (HCC)    sciatic nerve   PONV (postoperative nausea and vomiting)    Rectocele 01/27/2018   SVD (spontaneous vaginal delivery)    x 2   There were no vitals taken for this visit.  Opioid Risk Score:   Fall Risk Score:  `1  Depression screen [facility] 2/9     07/03/2022   10:32 AM 06/13/2022    9:39 AM 10/24/2021    9:29 AM 09/05/2021   11:44 AM 07/16/2021    3:04 PM 05/03/2021    9:44 AM 03/07/2021    2:35 PM  Depression screen PHQ 2/9  Decreased Interest  0 0 0 0 0 0 0  Down, Depressed, Hopeless 0 0 0 0 0 0 0  PHQ - 2 Score 0 0 0 0 0 0 0    Review of Systems  Musculoskeletal:  Positive  for arthralgias, back pain and neck pain.       Pain in fingers, legs, feet & shoulders      Objective:   Physical Exam   General no acute distress Mood and affect are appropriate Straight leg raise is negative Motor strength is 5/5 bilateral hip flexor knee extensor ankle dorsiflexor Sensation normal bilateral lower extremities Ambulates without assistive device no evidence of toe drag Sacral thrust (prone) : Positive on right Lateral compression: Negative FABER's: Negative Distraction (supine): Negative Thigh thrust test: Positive on right    Assessment & Plan:  1.  Sacroiliac disorder improved after bilateral sacroiliac nerve blocks.  We discussed that it is difficult to predict duration of response.  She is to call if she feels like her pain is intensifying once again. 2.  Fibromyalgia syndrome continue Lyrica.  Will need a refill in approximately 1 month

## 2022-09-04 DIAGNOSIS — M533 Sacrococcygeal disorders, not elsewhere classified: Secondary | ICD-10-CM | POA: Diagnosis not present

## 2022-09-04 DIAGNOSIS — M5030 Other cervical disc degeneration, unspecified cervical region: Secondary | ICD-10-CM | POA: Diagnosis not present

## 2022-09-04 DIAGNOSIS — M797 Fibromyalgia: Secondary | ICD-10-CM | POA: Diagnosis not present

## 2022-09-04 DIAGNOSIS — M5136 Other intervertebral disc degeneration, lumbar region: Secondary | ICD-10-CM | POA: Diagnosis not present

## 2022-09-08 NOTE — Progress Notes (Signed)
Office Visit Note  Patient: Tracey Rodriguez             Date of Birth: 1960-07-18           MRN: [id number]             PCP: Henrine Screws, MD Referring: Henrine Screws, MD Visit Date: 09/22/2022 Occupation: @GUAROCC @  Subjective:  Pain in multiple joints  History of Present Illness: Tracey Rodriguez is a 62 y.o. female with osteoarthritis, degenerative disc disease and fibromyalgia syndrome.  She states she continues to have pain and stiffness in her bilateral hands and her bilateral feet.  She also has neck and lower back pain.  She has been going to Dr. Wynn Banker for her neck and lower back pain.  Patient states she had cortisone injection to her back which was very effective and resolved her symptoms but now the symptoms are coming back.  She has a follow-up appointment as scheduled.  She continues to take Lyrica at bedtime which has been helpful.  She had viscosupplement injections to her knee joints in October 2023.  She states they are still working.  She notices some puffiness in her hands.  None of the other joints are painful or swollen.  She continues to have trapezius and SI joint discomfort.  She has been going for myofascial pain release which has been  helpful.    Activities of Daily Living:  Patient reports morning stiffness for 20-30 minutes.   Patient Reports nocturnal pain.  Difficulty dressing/grooming: Denies Difficulty climbing stairs: Reports Difficulty getting out of chair: Reports Difficulty using hands for taps, buttons, cutlery, and/or writing: Reports  Review of Systems  Constitutional:  Positive for fatigue.  HENT:  Negative for mouth sores and mouth dryness.   Eyes:  Positive for dryness.  Respiratory:  Negative for difficulty breathing.   Cardiovascular:  Negative for chest pain and palpitations.  Gastrointestinal:  Negative for blood in stool, constipation and diarrhea.  Endocrine: Positive for increased urination.  Genitourinary:  Positive for  involuntary urination.  Musculoskeletal:  Positive for joint pain, joint pain, joint swelling, myalgias, muscle weakness, morning stiffness, muscle tenderness and myalgias. Negative for gait problem.  Skin:  Negative for color change, rash, hair loss and sensitivity to sunlight.  Allergic/Immunologic: Positive for susceptible to infections.  Neurological:  Negative for dizziness and headaches.  Hematological:  Negative for swollen glands.  Psychiatric/Behavioral:  Positive for sleep disturbance. Negative for depressed mood. The patient is not nervous/anxious.     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   Vocal cord dysfunction 05/21/2020   Chronic cough 04/19/2020   Sensorineural hearing loss (SNHL), bilateral 04/04/2020   Osteopenia 12/01/2019   NAFLD (nonalcoholic fatty liver disease) 19/14/7829   Obesity (BMI 30.0-34.9) 09/29/2019   Hyperlipidemia 09/09/2018   Prediabetes 09/09/2018   Hx of adenomatous polyp of colon 02/04/2018   Rectocele 01/27/2018   Change in bowel habits 01/05/2018   Hemorrhoids 01/05/2018   Hoarseness 07/30/2017   Gastroesophageal reflux disease 07/30/2017   Pure hypercholesterolemia 06/03/2017   History of breast cancer 09/11/2016   Other fatigue 03/15/2016   Primary insomnia 03/15/2016   Primary osteoarthritis of both hands 03/15/2016   DDD cervical spine 03/15/2016   Osteoarthritis of lumbar spine 03/15/2016   History of bilateral carpal tunnel release 03/15/2016  Unspecified hypothyroidism 03/30/2013   Fibromyalgia 03/30/2013   Breast cancer (HCC) 03/30/2013   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   Dyspnea 02/02/2013   Restrictive lung disease 02/02/2013    Past Medical History:  Diagnosis Date   Arthritis    deg disc disease  - lower back, neck   Cancer (HCC)    left breast  surgery-lumpectomy   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 (HCC)    Fibromyalgia    GERD (gastroesophageal reflux disease)    Heart murmur    Hx of adenomatous polyp of colon 02/04/2018   Hyperlipidemia    Hypothyroidism    goiter   NAFLD (nonalcoholic fatty liver disease)    Neuromuscular disorder (HCC)    sciatic nerve   PONV (postoperative nausea and vomiting)    Rectocele 01/27/2018   SVD (spontaneous vaginal delivery)    x 2    Family History  Problem Relation Age of Onset   Breast cancer Mother 16   Ovarian cancer Mother    Heart disease Father 62   Heart attack Father    Clotting disorder Father    Rheumatologic disease Father    Crohn's disease Father    Esophageal cancer Brother    Heart attack Brother    Cancer Brother    Lung cancer Brother    Colon polyps Brother    Throat cancer Brother    Colon polyps Brother    Stroke Brother    Breast cancer Maternal Aunt 60   Esophageal cancer Maternal Uncle    Stroke Paternal Aunt    Heart disease Paternal Aunt    Kidney disease Paternal Uncle    Liver cancer Maternal Grandmother    Heart disease Maternal Grandfather    Lung cancer Maternal Grandfather    Irritable bowel syndrome Son    Colon cancer Neg Hx    Rectal cancer Neg Hx    Stomach cancer Neg Hx    Past Surgical History:  Procedure Laterality Date   ABLATION     ANTERIOR (CYSTOCELE) AND POSTERIOR REPAIR (RECTOCELE) WITH XENFORM GRAFT AND SACROSPINOUS FIXATION  09/10/2020   APPENDECTOMY     BILATERAL SALPINGOOPHORECTOMY  09/10/2020   BREAST EXCISIONAL BIOPSY Left    BREAST SURGERY     left- lumpectomy   DILATATION & CURRETTAGE/HYSTEROSCOPY WITH RESECTOCOPE N/A 04/05/2012   Procedure: hysteroscopy with endocervical curretting;  Surgeon: Philip Aspen, DO;  Location: [facility];  Service: Gynecology;  Laterality: N/A;   DILATION AND CURETTAGE OF UTERUS     EYE SURGERY Bilateral 10/2017   cataract extraction    LIVER BIOPSY  09/2018   NOSE  SURGERY     VAGINAL HYSTERECTOMY  09/10/2020   Social History   Social History Narrative   She is married, has children.  They are grown.  1 son and 1 daughter has at least 1 grandchild   Former smoker no significant alcohol no drug use   Immunization History  Administered Date(s) Administered   Hepatitis A, Adult 04/20/2014, 10/25/2014   Influenza Split 11/14/2008, 03/04/2011, 03/16/2012, 11/17/2012, 12/10/2017   Influenza,inj,Quad PF,6+ Mos 12/19/2016, 01/05/2018, 11/19/2018, 02/01/2020   Influenza,inj,quad, With Preservative 03/07/2014   Influenza-Unspecified 01/26/2014, 11/24/2016, 01/23/2019   PFIZER(Purple Top)SARS-COV-2 Vaccination 04/27/2019, 05/18/2019   Pneumococcal Conjugate-13 03/07/2014   Pneumococcal Polysaccharide-23 02/01/2020   Td 04/28/2005   Tdap 10/10/2014     Objective: Vital Signs: BP 128/81 (BP Location: Left Arm, Patient Position: Sitting, Cuff Size: Normal)   Pulse 76  Resp 17   Ht 5\' 6"  (1.676 m)   Wt 210 lb 12.8 oz (95.6 kg)   BMI 34.02 kg/m    Physical Exam Vitals and nursing note reviewed.  Constitutional:      Appearance: She is well-developed.  HENT:     Head: Normocephalic and atraumatic.  Eyes:     Conjunctiva/sclera: Conjunctivae normal.  Cardiovascular:     Rate and Rhythm: Normal rate and regular rhythm.     Heart sounds: Normal heart sounds.  Pulmonary:     Effort: Pulmonary effort is normal.     Breath sounds: Normal breath sounds.  Abdominal:     General: Bowel sounds are normal.     Palpations: Abdomen is soft.  Musculoskeletal:     Cervical back: Normal range of motion.  Lymphadenopathy:     Cervical: No cervical adenopathy.  Skin:    General: Skin is warm and dry.     Capillary Refill: Capillary refill takes less than 2 seconds.  Neurological:     Mental Status: She is alert and oriented to person, place, and time.  Psychiatric:        Behavior: Behavior normal.      Musculoskeletal Exam: She had limited lateral  rotation of the cervical spine.  She had limited painful range of motion of the lumbar spine.  She had tenderness over SI joints.  She had tenderness over bilateral trapezius region.  Shoulder joints, elbow joints, wrist joints, MCPs PIPs and DIPs were in good range of motion.  She had bilateral PIP and DIP thickening.  Hip joints were in good range of motion.  She had tenderness over bilateral trochanteric bursa.  She has crepitus in bilateral knee joints without any warmth swelling or effusion.  There was no tenderness over ankles or MTPs.  CDAI Exam: CDAI Score: -- Patient Global: --; Provider Global: -- Swollen: --; Tender: -- Joint Exam 09/22/2022   No joint exam has been documented for this visit   There is currently no information documented on the homunculus. Go to the Rheumatology activity and complete the homunculus joint exam.  Investigation: No additional findings.  Imaging: No results found.  Recent Labs: Lab Results  Component Value Date   WBC 9.7 04/29/2022   HGB 13.8 04/29/2022   PLT 385.0 04/29/2022   NA 139 10/30/2020   K 4.2 10/30/2020   CL 103 10/30/2020   CO2 28 10/30/2020   GLUCOSE 96 10/30/2020   BUN 18 10/30/2020   CREATININE 0.80 10/30/2020   BILITOT 0.3 10/30/2020   ALKPHOS 70 10/30/2020   AST 20 10/30/2020   ALT 26 10/30/2020   PROT 7.3 10/30/2020   ALBUMIN 4.0 10/30/2020   CALCIUM 9.5 10/30/2020   GFRAA 83 09/08/2018    Speciality Comments: No specialty comments available.  Procedures:  No procedures performed Allergies: Amlodipine, Capsaicin, Etodolac, Sulfa antibiotics, Solifenacin, Solifenacin succinate, Bee venom, Pepto-bismol [bismuth], Pork-derived products, Rosuvastatin, and Sulfonamide derivatives   Assessment / Plan:     Visit Diagnoses: Primary osteoarthritis of both hands-she continues to have pain and stiffness in her bilateral hands.  She notices intermittent swelling.  No synovitis was noted on the examination.  She had  bilateral CMC PIP and DIP thickening consistent with osteoarthritis.  Joint protection muscle strengthening was discussed.  History of bilateral carpal tunnel release-asymptomatic.  Primary osteoarthritis of both knees -she had good response to Orthovisc bilateral knees 10/2021-11/2021.  She has intermittent discomfort in her knee joints.  She denies any warmth  swelling or effusion.  She had crepitus with range of motion of her knee joints.  Lower extremity muscle strengthening exercises were discussed.  Trochanteric bursitis of both hips-she continue to have some tenderness in the trochanteric region.  IT band stretches were demonstrated and discussed.  Pain in both feet-she has discomfort in her feet due to underlying osteoarthritis.  Proper fitting shoes were advised.  DDD (degenerative disc disease), cervical -she had some limited lateral rotation with some discomfort.  She had bilateral trapezius spasm.  Lyrica 25 mg QHS and methocarbomol 500 mg BID PRN for muscle spasms. Followed by Dr. Claudette Laws.  DDD (degenerative disc disease), lumbar-she recently had cortisone injection by Dr. Wynn Banker.  She had good response to the injection.  She states the symptoms are coming back.  She is about to go for a cruise and would like to have repeat injections.  She will contact their office.  Chronic SI joint pain - Followed by Dr. Claudette Laws.  Fibromyalgia-she continues to have generalized pain and discomfort from fibromyalgia.  She has episodic increased pain.  She has been going to physical therapy and getting my up fascial release which has been helpful.  She continues to have trapezius spasm and SI joint discomfort.  Need for regular exercise and stretching was emphasized.  Other fatigue-related to fibromyalgia and insomnia.  Primary insomnia-good sleep hygiene was discussed.  Factor V deficiency (HCC)  History of COPD  History of breast cancer  Orders: No orders of the defined  types were placed in this encounter.  No orders of the defined types were placed in this encounter.    Follow-Up Instructions: Return in about 6 months (around 03/25/2023) for Osteoarthritis, FMS.   Pollyann Savoy, MD  Note - This record has been created using Animal nutritionist.  Chart creation errors have been sought, but may not always  have been located. Such creation errors do not reflect on  the standard of medical care.

## 2022-09-15 DIAGNOSIS — H8113 Benign paroxysmal vertigo, bilateral: Secondary | ICD-10-CM | POA: Diagnosis not present

## 2022-09-15 DIAGNOSIS — H6993 Unspecified Eustachian tube disorder, bilateral: Secondary | ICD-10-CM | POA: Diagnosis not present

## 2022-09-15 DIAGNOSIS — H6503 Acute serous otitis media, bilateral: Secondary | ICD-10-CM | POA: Diagnosis not present

## 2022-09-15 DIAGNOSIS — J329 Chronic sinusitis, unspecified: Secondary | ICD-10-CM | POA: Diagnosis not present

## 2022-09-22 ENCOUNTER — Ambulatory Visit: Payer: BC Managed Care – PPO | Attending: Rheumatology | Admitting: Rheumatology

## 2022-09-22 ENCOUNTER — Encounter: Payer: Self-pay | Admitting: Rheumatology

## 2022-09-22 VITALS — BP 128/81 | HR 76 | Resp 17 | Ht 66.0 in | Wt 210.8 lb

## 2022-09-22 DIAGNOSIS — M7061 Trochanteric bursitis, right hip: Secondary | ICD-10-CM | POA: Diagnosis not present

## 2022-09-22 DIAGNOSIS — M5136 Other intervertebral disc degeneration, lumbar region: Secondary | ICD-10-CM

## 2022-09-22 DIAGNOSIS — M898X5 Other specified disorders of bone, thigh: Secondary | ICD-10-CM

## 2022-09-22 DIAGNOSIS — Z853 Personal history of malignant neoplasm of breast: Secondary | ICD-10-CM

## 2022-09-22 DIAGNOSIS — F5101 Primary insomnia: Secondary | ICD-10-CM

## 2022-09-22 DIAGNOSIS — M797 Fibromyalgia: Secondary | ICD-10-CM

## 2022-09-22 DIAGNOSIS — R5383 Other fatigue: Secondary | ICD-10-CM

## 2022-09-22 DIAGNOSIS — M5030 Other cervical disc degeneration, unspecified cervical region: Secondary | ICD-10-CM

## 2022-09-22 DIAGNOSIS — M170 Bilateral primary osteoarthritis of knee: Secondary | ICD-10-CM | POA: Diagnosis not present

## 2022-09-22 DIAGNOSIS — M19041 Primary osteoarthritis, right hand: Secondary | ICD-10-CM | POA: Diagnosis not present

## 2022-09-22 DIAGNOSIS — M79671 Pain in right foot: Secondary | ICD-10-CM

## 2022-09-22 DIAGNOSIS — D682 Hereditary deficiency of other clotting factors: Secondary | ICD-10-CM

## 2022-09-22 DIAGNOSIS — M533 Sacrococcygeal disorders, not elsewhere classified: Secondary | ICD-10-CM

## 2022-09-22 DIAGNOSIS — M19042 Primary osteoarthritis, left hand: Secondary | ICD-10-CM

## 2022-09-22 DIAGNOSIS — M79672 Pain in left foot: Secondary | ICD-10-CM

## 2022-09-22 DIAGNOSIS — Z9889 Other specified postprocedural states: Secondary | ICD-10-CM

## 2022-09-22 DIAGNOSIS — M7062 Trochanteric bursitis, left hip: Secondary | ICD-10-CM

## 2022-09-22 DIAGNOSIS — Z8709 Personal history of other diseases of the respiratory system: Secondary | ICD-10-CM

## 2022-09-22 DIAGNOSIS — G8929 Other chronic pain: Secondary | ICD-10-CM

## 2022-09-24 DIAGNOSIS — J029 Acute pharyngitis, unspecified: Secondary | ICD-10-CM | POA: Diagnosis not present

## 2022-09-24 DIAGNOSIS — Z03818 Encounter for observation for suspected exposure to other biological agents ruled out: Secondary | ICD-10-CM | POA: Diagnosis not present

## 2022-09-24 DIAGNOSIS — R051 Acute cough: Secondary | ICD-10-CM | POA: Diagnosis not present

## 2022-10-07 DIAGNOSIS — E038 Other specified hypothyroidism: Secondary | ICD-10-CM | POA: Diagnosis not present

## 2022-10-07 DIAGNOSIS — R7303 Prediabetes: Secondary | ICD-10-CM | POA: Diagnosis not present

## 2022-10-07 DIAGNOSIS — E669 Obesity, unspecified: Secondary | ICD-10-CM | POA: Diagnosis not present

## 2022-10-07 DIAGNOSIS — K760 Fatty (change of) liver, not elsewhere classified: Secondary | ICD-10-CM | POA: Diagnosis not present

## 2022-10-10 DIAGNOSIS — H18613 Keratoconus, stable, bilateral: Secondary | ICD-10-CM | POA: Diagnosis not present

## 2022-10-10 DIAGNOSIS — H0288B Meibomian gland dysfunction left eye, upper and lower eyelids: Secondary | ICD-10-CM | POA: Diagnosis not present

## 2022-10-10 DIAGNOSIS — Z961 Presence of intraocular lens: Secondary | ICD-10-CM | POA: Diagnosis not present

## 2022-10-10 DIAGNOSIS — H0288A Meibomian gland dysfunction right eye, upper and lower eyelids: Secondary | ICD-10-CM | POA: Diagnosis not present

## 2022-10-16 ENCOUNTER — Telehealth: Payer: Self-pay | Admitting: Physical Medicine & Rehabilitation

## 2022-10-16 NOTE — Telephone Encounter (Signed)
Patient called in and is requesting a back injection . Patient states the last injection she received has wore off and her back has starting hurting last week

## 2022-10-20 ENCOUNTER — Encounter (HOSPITAL_COMMUNITY): Payer: Self-pay

## 2022-10-20 ENCOUNTER — Ambulatory Visit (HOSPITAL_COMMUNITY)
Admission: EM | Admit: 2022-10-20 | Discharge: 2022-10-20 | Disposition: A | Discharge status: Home / Self Care | Payer: BC Managed Care – PPO

## 2022-10-20 DIAGNOSIS — U071 COVID-19: Secondary | ICD-10-CM | POA: Diagnosis not present

## 2022-10-20 MED ORDER — BENZONATATE 100 MG PO CAPS: 100.0000 mg | ORAL_CAPSULE | Freq: Three times a day (TID) | ORAL | 0 refills | Status: DC | PRN

## 2022-10-20 MED ORDER — PAXLOVID (300/100) 20 X 150 MG & 10 X 100MG PO TBPK: 3.0000 | ORAL_TABLET | Freq: Two times a day (BID) | ORAL | 0 refills | Status: AC

## 2022-10-20 NOTE — ED Triage Notes (Signed)
Patient states she began having body aches and nasal congestion last night. Today, the patient c/o headache, jaw pain, and increased  body ache pain tody. Patient had a Covid positive home test today.  Patient states she had MUcinex DM at 0700 today and Delsym at 1230 today.

## 2022-10-20 NOTE — ED Provider Notes (Signed)
[facility]    CSN: [id number] Arrival date & time: 10/20/22  1646      History   Chief Complaint Chief Complaint  Patient presents with   Covid Positive    HPI Tracey Rodriguez is a 62 y.o. female.   Patient presents today with 1 day history of bodyaches, stuffy nose, headache, body pain all over, cough, runny nose, fatigue, and decreased appetite.  No chest pain, shortness of breath, abdominal pain, nausea/vomiting, or diarrhea.  Has taken Mucinex DM and Delsym and then got a little bit dizzy and so she came in to be seen.  Reports positive at-home COVID-19 test.  Reports grandchildren are sick with similar symptoms.    Past Medical History:  Diagnosis Date   Arthritis    deg disc disease  - lower back, neck   Cancer (HCC)    left breast surgery-lumpectomy   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 (HCC)    Fibromyalgia    GERD (gastroesophageal reflux disease)    Heart murmur    Hx of adenomatous polyp of colon 02/04/2018   Hyperlipidemia    Hypothyroidism    goiter   NAFLD (nonalcoholic fatty liver disease)    Neuromuscular disorder (HCC)    sciatic nerve   PONV (postoperative nausea and vomiting)    Rectocele 01/27/2018   SVD (spontaneous vaginal delivery)    x 2    Patient Active Problem List   Diagnosis Date Noted   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   Pain in both feet 04/08/2022   Non-toxic uninodular goiter 07/31/2020   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   Vocal cord dysfunction 05/21/2020   Chronic cough 04/19/2020   Sensorineural hearing loss (SNHL), bilateral 04/04/2020   Osteopenia 12/01/2019   NAFLD (nonalcoholic fatty liver disease) 84/69/6295   Obesity (BMI 30.0-34.9) 09/29/2019   Hyperlipidemia 09/09/2018   Prediabetes 09/09/2018   Hx of adenomatous polyp of colon 02/04/2018   Rectocele 01/27/2018   Change in bowel habits 01/05/2018   Hemorrhoids 01/05/2018   Hoarseness 07/30/2017   Gastroesophageal reflux disease 07/30/2017    Pure hypercholesterolemia 06/03/2017   History of breast cancer 09/11/2016   Other fatigue 03/15/2016   Primary insomnia 03/15/2016   Primary osteoarthritis of both hands 03/15/2016   DDD cervical spine 03/15/2016   Osteoarthritis of lumbar spine 03/15/2016   History of bilateral carpal tunnel release 03/15/2016   Unspecified hypothyroidism 03/30/2013   Fibromyalgia 03/30/2013   Breast cancer (HCC) 03/30/2013   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   Dyspnea 02/02/2013   Restrictive lung disease 02/02/2013    Past Surgical History:  Procedure Laterality Date   ABLATION     ANTERIOR (CYSTOCELE) AND POSTERIOR REPAIR (RECTOCELE) WITH XENFORM GRAFT AND SACROSPINOUS FIXATION  09/10/2020   APPENDECTOMY     BILATERAL SALPINGOOPHORECTOMY  09/10/2020   BREAST EXCISIONAL BIOPSY Left    BREAST SURGERY     left- lumpectomy   DILATATION & CURRETTAGE/HYSTEROSCOPY WITH RESECTOCOPE N/A 04/05/2012   Procedure: hysteroscopy with endocervical curretting;  Surgeon: Philip Aspen, DO;  Location: [facility];  Service: Gynecology;  Laterality: N/A;   DILATION AND CURETTAGE OF UTERUS     EYE SURGERY Bilateral 10/2017   cataract extraction    LIVER BIOPSY  09/2018   NOSE SURGERY     VAGINAL HYSTERECTOMY  09/10/2020    OB History   No obstetric history on file.      Home Medications    Prior to Admission medications   Medication  Sig Start Date End Date Taking? Authorizing Provider  benzonatate (TESSALON) 100 MG capsule Take 1 capsule (100 mg total) by mouth 3 (three) times daily as needed for cough. Do not take with alcohol or while driving or operating heavy machinery.  May cause drowsiness. 10/20/22  Yes Valentino Nose, NP  famotidine (PEPCID) 20 MG tablet Take 20 mg by mouth 2 (two) times daily.   Yes [provider]  nirmatrelvir & ritonavir (PAXLOVID, 300/100,) 20 x 150 MG & 10 x 100MG  TBPK Take 3 tablets by mouth 2 (two) times daily for 5 days. 10/20/22 10/25/22 Yes Valentino Nose, NP  B Complex Vitamins (VITAMIN B-COMPLEX) TABS Take 1 tablet by mouth daily. 09/05/20   [provider]  cetirizine (ZYRTEC) 10 MG tablet Take by mouth. 09/05/20   [provider]  dexlansoprazole (DEXILANT) 60 MG capsule TAKE 1 CAPSULE (60 MG TOTAL) BY MOUTH DAILY. TAKE 30 MINUTES BEFORE BREAKFAST 08/04/22   Iva Boop, MD  fluorouracil (EFUDEX) 5 % cream Apply topically. 11/15/21   [provider]  fluticasone (FLONASE) 50 MCG/ACT nasal spray Place into the nose. 06/26/20   [provider]  furosemide (LASIX) 20 MG tablet TAKE 2 TABLETS (40 MG TOTAL) BY MOUTH DAILY AS NEEDED FOR FLUID OR EDEMA. 12/16/21   Corky Crafts, MD  hydrocortisone 2.5 % ointment as needed.    [provider]  hydroquinone 4 % cream as needed. 04/04/20   [provider]  hypromellose (GENTEAL) 0.3 % GEL ophthalmic ointment Apply to eye. 09/05/20   [provider]  levothyroxine (SYNTHROID) 75 MCG tablet Take by mouth. 05/22/22 05/22/23  [provider]  MAGNESIUM MALATE PO Take by mouth. 09/05/20   [provider]  metFORMIN (GLUCOPHAGE-XR) 500 MG 24 hr tablet 500 mg 2 (two) times daily with a meal. 05/22/22   [provider]  methocarbamol (ROBAXIN) 500 MG tablet Take 500 mg by mouth as needed. 03/29/18   [provider]  metoprolol succinate (TOPROL-XL) 50 MG 24 hr tablet Take by mouth. 10/05/18   [provider]  mupirocin ointment (BACTROBAN) 2 % Apply topically as needed. 10/14/17   [provider]  pregabalin (LYRICA) 50 MG capsule TAKE 1 CAPSULE BY MOUTH EVERYDAY AT BEDTIME 08/26/22   Kirsteins, Victorino Sparrow, MD  scopolamine (TRANSDERM-SCOP) 1 MG/3DAYS Transderm-Scop 1.5 mg transdermal patch (1 mg over 3 days)    [provider]  triamcinolone cream (KENALOG) 0.1 % as needed.    [provider]  Vibegron (GEMTESA) 75 MG TABS Take by mouth. 03/12/22   [provider]   ZEPBOUND 7.5 MG/0.5ML Pen INJECT 0.5 ML (7.5 MG TOTAL) UNDER THE SKIN EVERY 7 DAYS    [provider]    Family History Family History  Problem Relation Age of Onset   Breast cancer Mother 29   Ovarian cancer Mother    Heart disease Father 17   Heart attack Father    Clotting disorder Father    Rheumatologic disease Father    Crohn's disease Father    Esophageal cancer Brother    Heart attack Brother    Cancer Brother    Lung cancer Brother    Colon polyps Brother    Throat cancer Brother    Colon polyps Brother    Stroke Brother    Breast cancer Maternal Aunt 60   Esophageal cancer Maternal Uncle    Stroke Paternal Aunt    Heart disease Paternal Aunt  Kidney disease Paternal Uncle    Liver cancer Maternal Grandmother    Heart disease Maternal Grandfather    Lung cancer Maternal Grandfather    Irritable bowel syndrome Son    Colon cancer Neg Hx    Rectal cancer Neg Hx    Stomach cancer Neg Hx     Social History Social History   Tobacco Use   Smoking status: Former    Current packs/day: 0.00    Average packs/day: 3.0 packs/day for 22.0 years (66.0 ttl pk-yrs)    Types: Cigarettes    Start date: 02/17/1978    Quit date: 02/18/2000    Years since quitting: 22.6    Passive exposure: Never   Smokeless tobacco: Never  Vaping Use   Vaping status: Never Used  Substance Use Topics   Alcohol use: Yes    Alcohol/week: 0.0 standard drinks of alcohol    Comment: RARELY   Drug use: Never     Allergies   Amlodipine, Capsaicin, Etodolac, Sulfa antibiotics, Solifenacin, Solifenacin succinate, Bee venom, Pepto-bismol [bismuth], Pork-derived products, Rosuvastatin, and Sulfonamide derivatives   Review of Systems Review of Systems Per HPI  Physical Exam Triage Vital Signs ED Triage Vitals  Encounter Vitals Group     BP 10/20/22 1707 135/76     Systolic BP Percentile --      Diastolic BP Percentile --      Pulse Rate 10/20/22 1707 94     Resp 10/20/22  1707 16     Temp 10/20/22 1707 99.5 F (37.5 C)     Temp Source 10/20/22 1707 Oral     SpO2 10/20/22 1707 98 %     Weight --      Height --      Head Circumference --      Peak Flow --      Pain Score 10/20/22 1709 9     Pain Loc --      Pain Education --      Exclude from Growth Chart --    No data found.  Updated Vital Signs BP 135/76 (BP Location: Right Arm)   Pulse 94   Temp 99.5 F (37.5 C) (Oral)   Resp 16   SpO2 98%   Visual Acuity Right Eye Distance:   Left Eye Distance:   Bilateral Distance:    Right Eye Near:   Left Eye Near:    Bilateral Near:     Physical Exam Vitals and nursing note reviewed.  Constitutional:      General: She is not in acute distress.    Appearance: Normal appearance. She is not ill-appearing or toxic-appearing.  HENT:     Head: Normocephalic and atraumatic.     Right Ear: Tympanic membrane, ear canal and external ear normal.     Left Ear: Tympanic membrane, ear canal and external ear normal.     Nose: No congestion or rhinorrhea.     Mouth/Throat:     Mouth: Mucous membranes are moist.     Pharynx: Oropharynx is clear. Posterior oropharyngeal erythema present. No oropharyngeal exudate.  Eyes:     General: No scleral icterus.    Extraocular Movements: Extraocular movements intact.  Cardiovascular:     Rate and Rhythm: Normal rate and regular rhythm.  Pulmonary:     Effort: Pulmonary effort is normal. No respiratory distress.     Breath sounds: Normal breath sounds. No wheezing, rhonchi or rales.  Abdominal:     General: Abdomen is flat. Bowel sounds are  normal. There is no distension.     Palpations: Abdomen is soft.  Musculoskeletal:     Cervical back: Normal range of motion and neck supple. No rigidity.  Lymphadenopathy:     Cervical: No cervical adenopathy.  Skin:    General: Skin is warm and dry.     Coloration: Skin is not jaundiced or pale.     Findings: No erythema or rash.  Neurological:     Mental Status: She is  alert and oriented to person, place, and time.  Psychiatric:        Behavior: Behavior is cooperative.      UC Treatments / Results  Labs (all labs ordered are listed, but only abnormal results are displayed) Labs Reviewed - No data to display  EKG   Radiology No results found.  Procedures Procedures (including critical care time)  Medications Ordered in UC Medications - No data to display  Initial Impression / Assessment and Plan / UC Course  I have reviewed the triage vital signs and the nursing notes.  Pertinent labs & imaging results that were available during my care of the patient were reviewed by me and considered in my medical decision making (see chart for details).   Patient is well-appearing, normotensive, afebrile, not tachycardic, not tachypneic, oxygenating well on room air.    1. Positive self-administered antigen test for COVID-19 Vitals and exam are reassuring Supportive care discussed Start Paxlovid Strict ER and return precautions discussed with patient  The patient was given the opportunity to ask questions.  All questions answered to their satisfaction.  The patient is in agreement to this plan.   Final Clinical Impressions(s) / UC Diagnoses   Final diagnoses:  Positive self-administered antigen test for COVID-19     Discharge Instructions      Your symptoms are consistent with a viral upper respiratory infection.  Symptoms should improve over the next week to 10 days.  If you develop chest pain or shortness of breath, go to the emergency room.  You can start taking the Paxlovid to treat COVID-19.  Also take the [facility] every 8 hours as needed for coughing.  Some things that can make you feel better are: - Increased rest - Increasing fluid with water/sugar free electrolytes - Acetaminophen and ibuprofen as needed for fever/pain - Salt water gargling, chloraseptic spray and throat lozenges - OTC guaifenesin (Mucinex) 600 mg twice  daily - Saline sinus flushes or a neti pot - Humidifying the air   ED Prescriptions     Medication Sig Dispense Auth. Provider   nirmatrelvir & ritonavir (PAXLOVID, 300/100,) 20 x 150 MG & 10 x 100MG  TBPK Take 3 tablets by mouth 2 (two) times daily for 5 days. 30 tablet Cathlean Marseilles A, NP   benzonatate (TESSALON) 100 MG capsule Take 1 capsule (100 mg total) by mouth 3 (three) times daily as needed for cough. Do not take with alcohol or while driving or operating heavy machinery.  May cause drowsiness. 30 capsule Valentino Nose, NP      PDMP not reviewed this encounter.   Valentino Nose, NP 10/20/22 [phone]

## 2022-10-20 NOTE — Discharge Instructions (Signed)
Your symptoms are consistent with a viral upper respiratory infection.  Symptoms should improve over the next week to 10 days.  If you develop chest pain or shortness of breath, go to the emergency room.  You can start taking the Paxlovid to treat COVID-19.  Also take the [facility] every 8 hours as needed for coughing.  Some things that can make you feel better are: - Increased rest - Increasing fluid with water/sugar free electrolytes - Acetaminophen and ibuprofen as needed for fever/pain - Salt water gargling, chloraseptic spray and throat lozenges - OTC guaifenesin (Mucinex) 600 mg twice daily - Saline sinus flushes or a neti pot - Humidifying the air

## 2022-10-28 DIAGNOSIS — J40 Bronchitis, not specified as acute or chronic: Secondary | ICD-10-CM | POA: Diagnosis not present

## 2022-11-11 ENCOUNTER — Other Ambulatory Visit: Payer: Self-pay | Admitting: Physical Medicine & Rehabilitation

## 2022-11-11 DIAGNOSIS — E039 Hypothyroidism, unspecified: Secondary | ICD-10-CM | POA: Diagnosis not present

## 2022-11-11 DIAGNOSIS — Z7185 Encounter for immunization safety counseling: Secondary | ICD-10-CM | POA: Diagnosis not present

## 2022-11-11 DIAGNOSIS — R42 Dizziness and giddiness: Secondary | ICD-10-CM | POA: Diagnosis not present

## 2022-11-11 DIAGNOSIS — M8589 Other specified disorders of bone density and structure, multiple sites: Secondary | ICD-10-CM | POA: Diagnosis not present

## 2022-11-21 DIAGNOSIS — E66811 Obesity, class 1: Secondary | ICD-10-CM | POA: Diagnosis not present

## 2022-11-21 DIAGNOSIS — R42 Dizziness and giddiness: Secondary | ICD-10-CM | POA: Diagnosis not present

## 2022-11-21 DIAGNOSIS — Z713 Dietary counseling and surveillance: Secondary | ICD-10-CM | POA: Diagnosis not present

## 2022-11-25 NOTE — Progress Notes (Signed)
PROCEDURE RECORD [facility]   Name: Tracey Rodriguez DOB:1960-05-24 MRN: [id number]  Date:11/25/2022  Physician: Claudette Laws, MD    Nurse/CMA: Charise Carwin MA  Allergies:  Allergies  Allergen Reactions   Amlodipine Itching    Rodriguez Rodriguez Rodriguez   Capsaicin Anaphylaxis    Pt not sure of name    Etodolac Shortness Of Breath    REACTION: dyspnea/tightness in chest Other reaction(s): shortness of breath Other reaction(s): shortness of breath Other reaction(s): shortness of breath Other reaction(s): shortness of breath Other reaction(s): shortness of breath   Sulfa Antibiotics Nausea And Vomiting    Other reaction(s): Unknown Other reaction(s): Unknown Other reaction(s): Unknown Other reaction(s): Unknown   Solifenacin Rash    Other reaction(s): rash Other reaction(s): rash Other reaction(s): rash Other reaction(s): rash   Solifenacin Succinate Rash   Bee Venom    Pepto-Bismol [Bismuth] Nausea And Vomiting   Pork-Derived Products Nausea And Vomiting    Other reaction(s): vomiting Other reaction(s): vomiting Other reaction(s): vomiting   Rosuvastatin Itching    All statins  Other reaction(s): itching Other reaction(s): itching Other reaction(s): itching Other reaction(s): itching   Sulfonamide Derivatives Nausea And Vomiting    REACTION: vomiting    Consent Signed: Yes.    Is patient diabetic? No.  CBG today? .  Pregnant: No. LMP: No LMP recorded. Patient is postmenopausal. (age 47-55)  Anticoagulants: no Anti-inflammatory: no Antibiotics: no  Procedure: Bilateral Sacroiliac Injections  Position: Prone Start Time: 1:40 pm  End Time: 2:08 pm  Fluoro Time: 1:42  RN/CMA Royal RMA Tracey Huckaba MA    Time 1:24 PM 2:13 pm    BP 134/83 148/86    Pulse 83 74    Respirations 16 16    O2 Sat 96 98    S/S 6 6    Pain Level 7/10 0/10     D/C home with Self, patient A & O X 3, D/C instructions reviewed, and sits  independently.         Subjective:    Patient ID: Tracey Rodriguez, female    DOB: Oct 31, 1960, 62 y.o.   MRN: [id number]  HPI    Review of Systems     Objective:   Physical Exam        Assessment & Plan:

## 2022-11-28 ENCOUNTER — Encounter: Payer: Self-pay | Admitting: Physical Medicine & Rehabilitation

## 2022-11-28 ENCOUNTER — Encounter
Payer: BC Managed Care – PPO | Attending: Physical Medicine & Rehabilitation | Admitting: Physical Medicine & Rehabilitation

## 2022-11-28 VITALS — BP 134/83 | HR 83 | Temp 97.7°F | Ht 66.0 in | Wt 199.0 lb

## 2022-11-28 DIAGNOSIS — M797 Fibromyalgia: Secondary | ICD-10-CM | POA: Diagnosis not present

## 2022-11-28 DIAGNOSIS — M533 Sacrococcygeal disorders, not elsewhere classified: Secondary | ICD-10-CM | POA: Insufficient documentation

## 2022-11-28 MED ORDER — LIDOCAINE HCL (PF) 2 % IJ SOLN
4.0000 mL | Freq: Once | INTRAMUSCULAR | Status: AC
Start: 2022-11-28 — End: 2022-11-28
  Administered 2022-11-28: 4 mL

## 2022-11-28 MED ORDER — IOHEXOL 180 MG/ML  SOLN
3.0000 mL | Freq: Once | INTRAMUSCULAR | Status: AC
  Administered 2022-11-28: 3 mL via INTRAVENOUS

## 2022-11-28 MED ORDER — LIDOCAINE HCL 1 % IJ SOLN
10.0000 mL | Freq: Once | INTRAMUSCULAR | Status: AC
Start: 2022-11-28 — End: 2022-11-28
  Administered 2022-11-28: 10 mL

## 2022-11-28 NOTE — Patient Instructions (Signed)
Sacroiliac nerve block injections performed today.  The injection was done under x-ray guidance with contrast enhancement. This procedure has been performed to help reduce low back and buttocks pain as well as potentially hip pain. The duration of this injection is variable lasting from hours to  weeks

## 2022-11-28 NOTE — Progress Notes (Signed)
L5 dorsal ramus S1-S2-S3 lateral branch blocks under fluoroscopic guidance Bilateral   Informed consent was obtained after describing risks and benefits of the procedure with patient these include bleeding bruising and infection.  He elects to proceed and has given written consent.  Patient placed prone on fluoroscopy table Betadine prep sterile drape a 25-gauge 1.5 inch needle was used to anesthetize skin and subcu tissue with 1% lidocaine 1 cc into each of 4 sites.  Then a 22-gauge 3.5" needle was inserted under fluoroscopic guidance for starting the S1 SAP sacral ala junction.  Bone contact made.  Omnipaque 180 x 0.5 mL demonstrated no intravascular uptake then 0.5 mL of 2% lidocaine was injected.  Then the lateral aspect of the S1, S2, S3 foramen was targeted.  Bone contact made out, Omnipaque 180 times 0.5 mL demonstrated no nerve root or intravascular uptake ( except at bilateral S1 which required needle repositioning) within 0.5 mL of 2% lidocaine solution was injected after negative drawback for blood.THis same procedure was repeated on Right side using same needle and injectate   Patient tolerated procedure well.  Postinjection instructions given.

## 2022-12-02 ENCOUNTER — Ambulatory Visit: Payer: BC Managed Care – PPO | Admitting: Physical Medicine & Rehabilitation

## 2022-12-02 DIAGNOSIS — Z713 Dietary counseling and surveillance: Secondary | ICD-10-CM | POA: Diagnosis not present

## 2022-12-02 DIAGNOSIS — K760 Fatty (change of) liver, not elsewhere classified: Secondary | ICD-10-CM | POA: Diagnosis not present

## 2022-12-02 DIAGNOSIS — E66811 Obesity, class 1: Secondary | ICD-10-CM | POA: Diagnosis not present

## 2022-12-02 DIAGNOSIS — R7303 Prediabetes: Secondary | ICD-10-CM | POA: Diagnosis not present

## 2022-12-04 DIAGNOSIS — R42 Dizziness and giddiness: Secondary | ICD-10-CM | POA: Diagnosis not present

## 2022-12-08 DIAGNOSIS — R42 Dizziness and giddiness: Secondary | ICD-10-CM | POA: Diagnosis not present

## 2022-12-10 ENCOUNTER — Telehealth: Payer: Self-pay

## 2022-12-10 DIAGNOSIS — M5030 Other cervical disc degeneration, unspecified cervical region: Secondary | ICD-10-CM | POA: Diagnosis not present

## 2022-12-10 DIAGNOSIS — M533 Sacrococcygeal disorders, not elsewhere classified: Secondary | ICD-10-CM | POA: Diagnosis not present

## 2022-12-10 DIAGNOSIS — M797 Fibromyalgia: Secondary | ICD-10-CM | POA: Diagnosis not present

## 2022-12-10 NOTE — Telephone Encounter (Signed)
Mrs. Norrid stated the last back injections on 11/28/2022 did on take. She would like to know what to do next?   Call back phone [phone].

## 2022-12-11 NOTE — Telephone Encounter (Signed)
I have left patient a voicemail to call back and schedule follow up appt with Dr. Wynn Banker to discuss other options.

## 2022-12-12 DIAGNOSIS — M8589 Other specified disorders of bone density and structure, multiple sites: Secondary | ICD-10-CM | POA: Diagnosis not present

## 2022-12-12 DIAGNOSIS — Z1239 Encounter for other screening for malignant neoplasm of breast: Secondary | ICD-10-CM | POA: Diagnosis not present

## 2022-12-12 DIAGNOSIS — R92323 Mammographic fibroglandular density, bilateral breasts: Secondary | ICD-10-CM | POA: Diagnosis not present

## 2022-12-12 DIAGNOSIS — Z1231 Encounter for screening mammogram for malignant neoplasm of breast: Secondary | ICD-10-CM | POA: Diagnosis not present

## 2022-12-12 DIAGNOSIS — R42 Dizziness and giddiness: Secondary | ICD-10-CM | POA: Diagnosis not present

## 2022-12-12 DIAGNOSIS — M85851 Other specified disorders of bone density and structure, right thigh: Secondary | ICD-10-CM | POA: Diagnosis not present

## 2022-12-16 ENCOUNTER — Encounter: Payer: BC Managed Care – PPO | Admitting: Physical Medicine & Rehabilitation

## 2022-12-16 ENCOUNTER — Encounter: Payer: Self-pay | Admitting: Physical Medicine & Rehabilitation

## 2022-12-16 VITALS — BP 126/78 | HR 80 | Ht 66.0 in | Wt 197.0 lb

## 2022-12-16 DIAGNOSIS — M797 Fibromyalgia: Secondary | ICD-10-CM

## 2022-12-16 DIAGNOSIS — M533 Sacrococcygeal disorders, not elsewhere classified: Secondary | ICD-10-CM

## 2022-12-16 MED ORDER — PREGABALIN 50 MG PO CAPS: 50.0000 mg | ORAL_CAPSULE | Freq: Two times a day (BID) | ORAL | 1 refills | Status: DC

## 2022-12-16 NOTE — Progress Notes (Signed)
Subjective:    Patient ID: Tracey Rodriguez, female    DOB: [birth year], 62 y.o.   MRN: [id number]  HPI  62 year old female with fibromyalgia and chronic low back pain.  She returns today with a history of low back as well as upper back and neck pain.  Her pain is 7-8 out of 10 described as constant sharp aching pain she also has some pain around the hips bilaterally and less so into the knees and the feet.  Her pain is worse with walking bending sitting and activity and standing. She has had intra-articular sacroiliac injections which produced a good short-term relief.  In addition she has had sacroiliac nerve blocks i.e. L5 dorsal ramus S1-S2-S3 lateral branch blocks bilaterally which have produced a 100% pain relief on 2 occasions however because of insurance restrictions was not able to proceed with the sacroiliac radiofrequency neurotomy. Pain Inventory Average Pain 8 Pain Right Now 8 My pain is constant, sharp, stabbing, and aching  In the last 24 hours, has pain interfered with the following? General activity 9 Relation with others 0 Enjoyment of life 2 What TIME of day is your pain at its worst? varies Sleep (in general) Poor  Pain is worse with: walking, bending, sitting, inactivity, standing, and some activites Pain improves with: injections and but last ones did not help at all  Relief from Meds:  .  Family History  Problem Relation Age of Onset   Breast cancer Mother 62   Ovarian cancer Mother    Heart disease Father 14   Heart attack Father    Clotting disorder Father    Rheumatologic disease Father    Crohn's disease Father    Esophageal cancer Brother    Heart attack Brother    Cancer Brother    Lung cancer Brother    Colon polyps Brother    Throat cancer Brother    Colon polyps Brother    Stroke Brother    Breast cancer Maternal Aunt 60   Esophageal cancer Maternal Uncle    Stroke Paternal Aunt    Heart disease Paternal Aunt    Kidney disease Paternal  Uncle    Liver cancer Maternal Grandmother    Heart disease Maternal Grandfather    Lung cancer Maternal Grandfather    Irritable bowel syndrome Son    Colon cancer Neg Hx    Rectal cancer Neg Hx    Stomach cancer Neg Hx    Social History   Socioeconomic History   Marital status: Married    Spouse name: Not on file   Number of children: 2   Years of education: Not on file   Highest education level: Not on file  Occupational History   Not on file  Tobacco Use   Smoking status: Former    Current packs/day: 0.00    Average packs/day: 3.0 packs/day for 22.0 years (66.0 ttl pk-yrs)    Types: Cigarettes    Start date: 02/17/1978    Quit date: 02/18/2000    Years since quitting: 22.8    Passive exposure: Never   Smokeless tobacco: Never  Vaping Use   Vaping status: Never Used  Substance and Sexual Activity   Alcohol use: Yes    Alcohol/week: 0.0 standard drinks of alcohol    Comment: RARELY   Drug use: Never   Sexual activity: Yes    Birth control/protection: Post-menopausal  Other Topics Concern   Not on file  Social History Narrative   She is married,  has children.  They are grown.  1 son and 1 daughter has at least 1 grandchild   Former smoker no significant alcohol no drug use   Social Determinants of Corporate investment banker Strain: Not on file  Food Insecurity: No Food Insecurity (03/05/2021)   Received from [facility], [facility]   Hunger Vital Sign    Worried About Running Out of Food in the Last Year: Never true    Ran Out of Food in the Last Year: Never true  Transportation Needs: Not on file  Physical Activity: Not on file  Stress: Not on file  Social Connections: Unknown (06/25/2021)   Received from [facility], [facility]   Social Network    Social Network: Not on file   Past Surgical History:  Procedure Laterality Date   ABLATION     ANTERIOR (CYSTOCELE) AND POSTERIOR REPAIR (RECTOCELE) WITH XENFORM GRAFT AND SACROSPINOUS FIXATION   09/10/2020   APPENDECTOMY     BILATERAL SALPINGOOPHORECTOMY  09/10/2020   BREAST EXCISIONAL BIOPSY Left    BREAST SURGERY     left- lumpectomy   DILATATION & CURRETTAGE/HYSTEROSCOPY WITH RESECTOCOPE N/A 04/05/2012   Procedure: hysteroscopy with endocervical curretting;  Surgeon: Philip Aspen, DO;  Location: [facility];  Service: Gynecology;  Laterality: N/A;   DILATION AND CURETTAGE OF UTERUS     EYE SURGERY Bilateral 10/2017   cataract extraction    LIVER BIOPSY  09/2018   NOSE SURGERY     VAGINAL HYSTERECTOMY  09/10/2020   Past Surgical History:  Procedure Laterality Date   ABLATION     ANTERIOR (CYSTOCELE) AND POSTERIOR REPAIR (RECTOCELE) WITH XENFORM GRAFT AND SACROSPINOUS FIXATION  09/10/2020   APPENDECTOMY     BILATERAL SALPINGOOPHORECTOMY  09/10/2020   BREAST EXCISIONAL BIOPSY Left    BREAST SURGERY     left- lumpectomy   DILATATION & CURRETTAGE/HYSTEROSCOPY WITH RESECTOCOPE N/A 04/05/2012   Procedure: hysteroscopy with endocervical curretting;  Surgeon: Philip Aspen, DO;  Location: [facility];  Service: Gynecology;  Laterality: N/A;   DILATION AND CURETTAGE OF UTERUS     EYE SURGERY Bilateral 10/2017   cataract extraction    LIVER BIOPSY  09/2018   NOSE SURGERY     VAGINAL HYSTERECTOMY  09/10/2020   Past Medical History:  Diagnosis Date   Arthritis    deg disc disease  - lower back, neck   Cancer (HCC)    left breast surgery-lumpectomy   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 (HCC)    Fibromyalgia    GERD (gastroesophageal reflux disease)    Heart murmur    Hx of adenomatous polyp of colon 02/04/2018   Hyperlipidemia    Hypothyroidism    goiter   NAFLD (nonalcoholic fatty liver disease)    Neuromuscular disorder (HCC)    sciatic nerve   PONV (postoperative nausea and vomiting)    Rectocele 01/27/2018   SVD (spontaneous vaginal delivery)    x 2   BP 126/78   Pulse 80   Ht 5\' 6"  (1.676 m)   Wt 197 lb (89.4 kg)   SpO2 92%   BMI 31.80 kg/m    Opioid Risk Score:   Fall Risk Score:  `1  Depression screen PHQ 2/9     12/16/2022    2:02 PM 11/28/2022    1:39 PM 08/29/2022    1:54 PM 07/03/2022   10:32 AM 06/13/2022    9:39 AM 10/24/2021    9:29 AM 09/05/2021  11:44 AM  Depression screen PHQ 2/9  Decreased Interest 0 0 0 0 0 0 0  Down, Depressed, Hopeless 0 0 0 0 0 0 0  PHQ - 2 Score 0 0 0 0 0 0 0      Review of Systems  Musculoskeletal:  Positive for back pain, gait problem and neck pain.       B/L hip pain   All other systems reviewed and are negative.     Objective:   Physical Exam  General No acute distress Mood and affect appropriate Tenderness palpation bilateral upper traps bilateral cervical thoracic and lumbar paraspinal area bilateral greater trochanter bilateral gluteus medius and maximus area.  Sacral thrust (prone) : Positive on right Lateral compression: Negative FABER's: Positive on right negative on left Distraction (supine): Negative Thigh thrust test: Negative  Negative straight leg raise Lower extremity strength is 5/5 Ambulates without assistive device no evidence of toe drag or knee instability Lumbar spine range of motion is 50% range forward flexion and extension.      Assessment & Plan:  1.  Fibromyalgia syndrome with diffuse body achiness.  She is on low-dose Lyrica 50 mg/day will increase to twice daily.  We discussed that we may need to increase slowly to higher doses either on twice daily or 3 times daily basis. 2.  Sacroiliac discomfort does not appear to be the main issue today given her examination.  Do not think repeat sacroiliac blocks would be needed at this time. Will see her back in 3 months

## 2022-12-16 NOTE — Patient Instructions (Signed)
May need to slowly increase lyrica dose

## 2022-12-17 DIAGNOSIS — M533 Sacrococcygeal disorders, not elsewhere classified: Secondary | ICD-10-CM | POA: Diagnosis not present

## 2022-12-17 DIAGNOSIS — M5030 Other cervical disc degeneration, unspecified cervical region: Secondary | ICD-10-CM | POA: Diagnosis not present

## 2022-12-17 DIAGNOSIS — M797 Fibromyalgia: Secondary | ICD-10-CM | POA: Diagnosis not present

## 2023-01-01 DIAGNOSIS — M797 Fibromyalgia: Secondary | ICD-10-CM | POA: Diagnosis not present

## 2023-01-01 DIAGNOSIS — M5136 Other intervertebral disc degeneration, lumbar region with discogenic back pain only: Secondary | ICD-10-CM | POA: Diagnosis not present

## 2023-01-01 DIAGNOSIS — M533 Sacrococcygeal disorders, not elsewhere classified: Secondary | ICD-10-CM | POA: Diagnosis not present

## 2023-01-01 DIAGNOSIS — M5030 Other cervical disc degeneration, unspecified cervical region: Secondary | ICD-10-CM | POA: Diagnosis not present

## 2023-01-07 DIAGNOSIS — M5136 Other intervertebral disc degeneration, lumbar region with discogenic back pain only: Secondary | ICD-10-CM | POA: Diagnosis not present

## 2023-01-07 DIAGNOSIS — M5030 Other cervical disc degeneration, unspecified cervical region: Secondary | ICD-10-CM | POA: Diagnosis not present

## 2023-01-07 DIAGNOSIS — M797 Fibromyalgia: Secondary | ICD-10-CM | POA: Diagnosis not present

## 2023-01-07 DIAGNOSIS — M533 Sacrococcygeal disorders, not elsewhere classified: Secondary | ICD-10-CM | POA: Diagnosis not present

## 2023-01-19 ENCOUNTER — Telehealth: Payer: Self-pay | Admitting: Physician Assistant

## 2023-01-19 NOTE — Telephone Encounter (Signed)
Ok to reapply for visco for both knees

## 2023-01-19 NOTE — Telephone Encounter (Signed)
Pt called wanting to apply for knee gel injections. Let pt know we would not apply for those till January 1st of 2025 if approved.

## 2023-01-20 DIAGNOSIS — M5136 Other intervertebral disc degeneration, lumbar region with discogenic back pain only: Secondary | ICD-10-CM | POA: Diagnosis not present

## 2023-01-20 DIAGNOSIS — M797 Fibromyalgia: Secondary | ICD-10-CM | POA: Diagnosis not present

## 2023-01-20 DIAGNOSIS — H6993 Unspecified Eustachian tube disorder, bilateral: Secondary | ICD-10-CM | POA: Diagnosis not present

## 2023-01-20 DIAGNOSIS — R42 Dizziness and giddiness: Secondary | ICD-10-CM | POA: Diagnosis not present

## 2023-01-20 DIAGNOSIS — M533 Sacrococcygeal disorders, not elsewhere classified: Secondary | ICD-10-CM | POA: Diagnosis not present

## 2023-01-20 DIAGNOSIS — M5030 Other cervical disc degeneration, unspecified cervical region: Secondary | ICD-10-CM | POA: Diagnosis not present

## 2023-01-21 DIAGNOSIS — Z79899 Other long term (current) drug therapy: Secondary | ICD-10-CM | POA: Diagnosis not present

## 2023-01-21 DIAGNOSIS — R200 Anesthesia of skin: Secondary | ICD-10-CM | POA: Diagnosis not present

## 2023-01-21 DIAGNOSIS — R42 Dizziness and giddiness: Secondary | ICD-10-CM | POA: Diagnosis not present

## 2023-01-21 DIAGNOSIS — Z780 Asymptomatic menopausal state: Secondary | ICD-10-CM | POA: Diagnosis not present

## 2023-01-26 ENCOUNTER — Other Ambulatory Visit: Payer: Self-pay | Admitting: Internal Medicine

## 2023-01-26 DIAGNOSIS — M5030 Other cervical disc degeneration, unspecified cervical region: Secondary | ICD-10-CM | POA: Diagnosis not present

## 2023-01-26 DIAGNOSIS — M797 Fibromyalgia: Secondary | ICD-10-CM | POA: Diagnosis not present

## 2023-01-26 DIAGNOSIS — K219 Gastro-esophageal reflux disease without esophagitis: Secondary | ICD-10-CM

## 2023-01-26 DIAGNOSIS — M533 Sacrococcygeal disorders, not elsewhere classified: Secondary | ICD-10-CM | POA: Diagnosis not present

## 2023-01-26 DIAGNOSIS — M5136 Other intervertebral disc degeneration, lumbar region with discogenic back pain only: Secondary | ICD-10-CM | POA: Diagnosis not present

## 2023-01-27 DIAGNOSIS — M797 Fibromyalgia: Secondary | ICD-10-CM | POA: Diagnosis not present

## 2023-01-27 DIAGNOSIS — M5136 Other intervertebral disc degeneration, lumbar region with discogenic back pain only: Secondary | ICD-10-CM | POA: Diagnosis not present

## 2023-01-27 DIAGNOSIS — M533 Sacrococcygeal disorders, not elsewhere classified: Secondary | ICD-10-CM | POA: Diagnosis not present

## 2023-01-27 DIAGNOSIS — M5030 Other cervical disc degeneration, unspecified cervical region: Secondary | ICD-10-CM | POA: Diagnosis not present

## 2023-01-28 DIAGNOSIS — Z713 Dietary counseling and surveillance: Secondary | ICD-10-CM | POA: Diagnosis not present

## 2023-01-28 DIAGNOSIS — E669 Obesity, unspecified: Secondary | ICD-10-CM | POA: Diagnosis not present

## 2023-01-31 ENCOUNTER — Other Ambulatory Visit: Payer: Self-pay | Admitting: Physical Medicine & Rehabilitation

## 2023-02-04 DIAGNOSIS — R42 Dizziness and giddiness: Secondary | ICD-10-CM | POA: Diagnosis not present

## 2023-02-04 DIAGNOSIS — H6993 Unspecified Eustachian tube disorder, bilateral: Secondary | ICD-10-CM | POA: Diagnosis not present

## 2023-02-18 DIAGNOSIS — G939 Disorder of brain, unspecified: Secondary | ICD-10-CM

## 2023-02-18 DIAGNOSIS — C4491 Basal cell carcinoma of skin, unspecified: Secondary | ICD-10-CM

## 2023-02-18 HISTORY — DX: Basal cell carcinoma of skin, unspecified: C44.91

## 2023-02-18 HISTORY — DX: Disorder of brain, unspecified: G93.9

## 2023-02-19 NOTE — Telephone Encounter (Signed)
 VOB submitted for Orthovisc, Bilateral knee(s) BV pending

## 2023-02-24 DIAGNOSIS — M533 Sacrococcygeal disorders, not elsewhere classified: Secondary | ICD-10-CM | POA: Diagnosis not present

## 2023-02-24 DIAGNOSIS — M5136 Other intervertebral disc degeneration, lumbar region with discogenic back pain only: Secondary | ICD-10-CM | POA: Diagnosis not present

## 2023-02-24 DIAGNOSIS — M5030 Other cervical disc degeneration, unspecified cervical region: Secondary | ICD-10-CM | POA: Diagnosis not present

## 2023-02-24 DIAGNOSIS — M797 Fibromyalgia: Secondary | ICD-10-CM | POA: Diagnosis not present

## 2023-02-25 DIAGNOSIS — E7800 Pure hypercholesterolemia, unspecified: Secondary | ICD-10-CM | POA: Diagnosis not present

## 2023-02-25 DIAGNOSIS — E559 Vitamin D deficiency, unspecified: Secondary | ICD-10-CM | POA: Diagnosis not present

## 2023-02-25 DIAGNOSIS — E538 Deficiency of other specified B group vitamins: Secondary | ICD-10-CM | POA: Diagnosis not present

## 2023-02-25 DIAGNOSIS — R7303 Prediabetes: Secondary | ICD-10-CM | POA: Diagnosis not present

## 2023-02-26 ENCOUNTER — Encounter
Payer: BC Managed Care – PPO | Attending: Physical Medicine & Rehabilitation | Admitting: Physical Medicine & Rehabilitation

## 2023-02-26 ENCOUNTER — Encounter: Payer: Self-pay | Admitting: Physical Medicine & Rehabilitation

## 2023-02-26 VITALS — BP 112/74 | HR 83 | Ht 66.0 in | Wt 194.0 lb

## 2023-02-26 DIAGNOSIS — E538 Deficiency of other specified B group vitamins: Secondary | ICD-10-CM | POA: Diagnosis not present

## 2023-02-26 DIAGNOSIS — M5136 Other intervertebral disc degeneration, lumbar region with discogenic back pain only: Secondary | ICD-10-CM | POA: Diagnosis not present

## 2023-02-26 DIAGNOSIS — M797 Fibromyalgia: Secondary | ICD-10-CM | POA: Insufficient documentation

## 2023-02-26 DIAGNOSIS — E559 Vitamin D deficiency, unspecified: Secondary | ICD-10-CM | POA: Diagnosis not present

## 2023-02-26 DIAGNOSIS — R7303 Prediabetes: Secondary | ICD-10-CM | POA: Diagnosis not present

## 2023-02-26 DIAGNOSIS — E7800 Pure hypercholesterolemia, unspecified: Secondary | ICD-10-CM | POA: Diagnosis not present

## 2023-02-26 DIAGNOSIS — M533 Sacrococcygeal disorders, not elsewhere classified: Secondary | ICD-10-CM | POA: Insufficient documentation

## 2023-02-26 DIAGNOSIS — M5030 Other cervical disc degeneration, unspecified cervical region: Secondary | ICD-10-CM | POA: Diagnosis not present

## 2023-02-26 MED ORDER — PREGABALIN 75 MG PO CAPS: 75.0000 mg | ORAL_CAPSULE | Freq: Two times a day (BID) | ORAL | 1 refills | Status: DC

## 2023-02-26 NOTE — Patient Instructions (Signed)
 Back Exercises These exercises help to make your trunk and back strong. They also help to keep the lower back flexible. Doing these exercises can help to prevent or lessen pain in your lower back. If you have back pain, try to do these exercises 2-3 times each day or as told by your doctor. As you get better, do the exercises once each day. Repeat the exercises more often as told by your doctor. To stop back pain from coming back, do the exercises once each day, or as told by your doctor. Do exercises exactly as told by your doctor. Stop right away if you feel sudden pain or your pain gets worse. Exercises Single knee to chest Do these steps 3-5 times in a row for each leg: Lie on your back on a firm bed or the floor with your legs stretched out. Bring one knee to your chest. Grab your knee or thigh with both hands and hold it in place. Pull on your knee until you feel a gentle stretch in your lower back or butt. Keep doing the stretch for 10-30 seconds. Slowly let go of your leg and straighten it. Pelvic tilt Do these steps 5-10 times in a row: Lie on your back on a firm bed or the floor with your legs stretched out. Bend your knees so they point up to the ceiling. Your feet should be flat on the floor. Tighten your lower belly (abdomen) muscles to press your lower back against the floor. This will make your tailbone point up to the ceiling instead of pointing down to your feet or the floor. Stay in this position for 5-10 seconds while you gently tighten your muscles and breathe evenly. Cat-cow Do these steps until your lower back bends more easily: Get on your hands and knees on a firm bed or the floor. Keep your hands under your shoulders, and keep your knees under your hips. You may put padding under your knees. Let your head hang down toward your chest. Tighten (contract) the muscles in your belly. Point your tailbone toward the floor so your lower back becomes rounded like the back of a  cat. Stay in this position for 5 seconds. Slowly lift your head. Let the muscles of your belly relax. Point your tailbone up toward the ceiling so your back forms a sagging arch like the back of a cow. Stay in this position for 5 seconds.  Press-ups Do these steps 5-10 times in a row: Lie on your belly (face-down) on a firm bed or the floor. Place your hands near your head, about shoulder-width apart. While you keep your back relaxed and keep your hips on the floor, slowly straighten your arms to raise the top half of your body and lift your shoulders. Do not use your back muscles. You may change where you place your hands to make yourself more comfortable. Stay in this position for 5 seconds. Keep your back relaxed. Slowly return to lying flat on the floor.  Bridges Do these steps 10 times in a row: Lie on your back on a firm bed or the floor. Bend your knees so they point up to the ceiling. Your feet should be flat on the floor. Your arms should be flat at your sides, next to your body. Tighten your butt muscles and lift your butt off the floor until your waist is almost as high as your knees. If you do not feel the muscles working in your butt and the back of  your thighs, slide your feet 1-2 inches (2.5-5 cm) farther away from your butt. Stay in this position for 3-5 seconds. Slowly lower your butt to the floor, and let your butt muscles relax. If this exercise is too easy, try doing it with your arms crossed over your chest. Belly crunches Do these steps 5-10 times in a row: Lie on your back on a firm bed or the floor with your legs stretched out. Bend your knees so they point up to the ceiling. Your feet should be flat on the floor. Cross your arms over your chest. Tip your chin a little bit toward your chest, but do not bend your neck. Tighten your belly muscles and slowly raise your chest just enough to lift your shoulder blades a tiny bit off the floor. Avoid raising your body  higher than that because it can put too much stress on your lower back. Slowly lower your chest and your head to the floor. Back lifts Do these steps 5-10 times in a row: Lie on your belly (face-down) with your arms at your sides, and rest your forehead on the floor. Tighten the muscles in your legs and your butt. Slowly lift your chest off the floor while you keep your hips on the floor. Keep the back of your head in line with the curve in your back. Look at the floor while you do this. Stay in this position for 3-5 seconds. Slowly lower your chest and your face to the floor. Contact a doctor if: Your back pain gets a lot worse when you do an exercise. Your back pain does not get better within 2 hours after you exercise. If you have any of these problems, stop doing the exercises. Do not do them again unless your doctor says it is okay. Get help right away if: You have sudden, very bad back pain. If this happens, stop doing the exercises. Do not do them again unless your doctor says it is okay. This information is not intended to replace advice given to you by your health care provider. Make sure you discuss any questions you have with your health care provider. Document Revised: 04/18/2020 Document Reviewed: 04/18/2020 Elsevier Patient Education  2024 [employer].

## 2023-02-26 NOTE — Progress Notes (Signed)
 Subjective:    Patient ID: Tracey Rodriguez, female    DOB: January 07, 1961, 63 y.o.   MRN: [id number]  HPI  63 yo female with hx of fibromyalgia syndrome and sacroiliac disorder Seen today with primary complaints of back pain neck pain and hip pain Last SI nerve block performed on 11/28/22 which gave short term 100% pain relief  Prior SI nerve block was 07/18/22 and lasted 77mo Patient remains modified independent with all self-care and mobility. She has not suffered any recent falls.  She goes to a massage therapist pain diagram mainly indicates low back pain today.  Pain Inventory Average Pain 8 Pain Right Now 9 My pain is aching and pressure  In the last 24 hours, has pain interfered with the following? General activity 5 Relation with others 5 Enjoyment of life 5 What TIME of day is your pain at its worst? morning , daytime, evening, and night Sleep (in general) Fair  Pain is worse with: sitting and standing Pain improves with: medication and injections Relief from Meds:  3  Family History  Problem Relation Age of Onset   Breast cancer Mother 68   Ovarian cancer Mother    Heart disease Father 56   Heart attack Father    Clotting disorder Father    Rheumatologic disease Father    Crohn's disease Father    Esophageal cancer Brother    Heart attack Brother    Cancer Brother    Lung cancer Brother    Colon polyps Brother    Throat cancer Brother    Colon polyps Brother    Stroke Brother    Breast cancer Maternal Aunt 60   Esophageal cancer Maternal Uncle    Stroke Paternal Aunt    Heart disease Paternal Aunt    Kidney disease Paternal Uncle    Liver cancer Maternal Grandmother    Heart disease Maternal Grandfather    Lung cancer Maternal Grandfather    Irritable bowel syndrome Son    Colon cancer Neg Hx    Rectal cancer Neg Hx    Stomach cancer Neg Hx    Social History   Socioeconomic History   Marital status: Married    Spouse name: Not on file   Number  of children: 2   Years of education: Not on file   Highest education level: Not on file  Occupational History   Not on file  Tobacco Use   Smoking status: Former    Current packs/day: 0.00    Average packs/day: 3.0 packs/day for 22.0 years (66.0 ttl pk-yrs)    Types: Cigarettes    Start date: 02/17/1978    Quit date: 02/18/2000    Years since quitting: 23.0    Passive exposure: Never   Smokeless tobacco: Never  Vaping Use   Vaping status: Never Used  Substance and Sexual Activity   Alcohol use: Yes    Alcohol/week: 0.0 standard drinks of alcohol    Comment: RARELY   Drug use: Never   Sexual activity: Yes    Birth control/protection: Post-menopausal  Other Topics Concern   Not on file  Social History Narrative   She is married, has children.  They are grown.  1 son and 1 daughter has at least 1 grandchild   Former smoker no significant alcohol no drug use   Social Drivers of Corporate Investment Banker Strain: Not on file  Food Insecurity: No Food Insecurity (03/05/2021)   Received from [facility], [facility]  Hunger Vital Sign    Worried About Running Out of Food in the Last Year: Never true    Ran Out of Food in the Last Year: Never true  Transportation Needs: Not on file  Physical Activity: Not on file  Stress: Not on file  Social Connections: Unknown (06/25/2021)   Received from [facility], [facility]   Social Network    Social Network: Not on file   Past Surgical History:  Procedure Laterality Date   ABLATION     ANTERIOR (CYSTOCELE) AND POSTERIOR REPAIR (RECTOCELE) WITH XENFORM GRAFT AND SACROSPINOUS FIXATION  09/10/2020   APPENDECTOMY     BILATERAL SALPINGOOPHORECTOMY  09/10/2020   BREAST EXCISIONAL BIOPSY Left    BREAST SURGERY     left- lumpectomy   DILATATION & CURRETTAGE/HYSTEROSCOPY WITH RESECTOCOPE N/A 04/05/2012   Procedure: hysteroscopy with endocervical curretting;  Surgeon: Donna Just, DO;  Location: [facility];  Service: Gynecology;   Laterality: N/A;   DILATION AND CURETTAGE OF UTERUS     EYE SURGERY Bilateral 10/2017   cataract extraction    LIVER BIOPSY  09/2018   NOSE SURGERY     VAGINAL HYSTERECTOMY  09/10/2020   Past Surgical History:  Procedure Laterality Date   ABLATION     ANTERIOR (CYSTOCELE) AND POSTERIOR REPAIR (RECTOCELE) WITH XENFORM GRAFT AND SACROSPINOUS FIXATION  09/10/2020   APPENDECTOMY     BILATERAL SALPINGOOPHORECTOMY  09/10/2020   BREAST EXCISIONAL BIOPSY Left    BREAST SURGERY     left- lumpectomy   DILATATION & CURRETTAGE/HYSTEROSCOPY WITH RESECTOCOPE N/A 04/05/2012   Procedure: hysteroscopy with endocervical curretting;  Surgeon: Donna Just, DO;  Location: [facility];  Service: Gynecology;  Laterality: N/A;   DILATION AND CURETTAGE OF UTERUS     EYE SURGERY Bilateral 10/2017   cataract extraction    LIVER BIOPSY  09/2018   NOSE SURGERY     VAGINAL HYSTERECTOMY  09/10/2020   Past Medical History:  Diagnosis Date   Arthritis    deg disc disease  - lower back, neck   Cancer (HCC)    left breast surgery-lumpectomy   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 (HCC)    Fibromyalgia    GERD (gastroesophageal reflux disease)    Heart murmur    Hx of adenomatous polyp of colon 02/04/2018   Hyperlipidemia    Hypothyroidism    goiter   NAFLD (nonalcoholic fatty liver disease)    Neuromuscular disorder (HCC)    sciatic nerve   PONV (postoperative nausea and vomiting)    Rectocele 01/27/2018   SVD (spontaneous vaginal delivery)    x 2   BP 112/74   Pulse 83   Ht 5' 6 (1.676 m)   Wt 194 lb (88 kg)   SpO2 98%   BMI 31.31 kg/m   Opioid Risk Score:   Fall Risk Score:  `1  Depression screen PHQ 2/9     12/16/2022    2:02 PM 11/28/2022    1:39 PM 08/29/2022    1:54 PM 07/03/2022   10:32 AM 06/13/2022    9:39 AM 10/24/2021    9:29 AM 09/05/2021   11:44 AM  Depression screen PHQ 2/9  Decreased Interest 0 0 0 0 0 0 0  Down, Depressed, Hopeless 0 0 0 0 0 0 0  PHQ - 2 Score  0 0 0 0 0 0 0     Review of Systems  Musculoskeletal:  Positive for back pain.  Bilateral hip pain spasms  All other systems reviewed and are negative.     Objective:   Physical Exam  General No acute distress mood and affect are appropriate Extremities without edema Negative straight leg raising bilateral lower extremities Motor strength is 5/5 bilateral hip flexor knee extensor ankle dorsiflexion Tenderness palpation bilateral upper traps bilateral upper medial scapular border bilateral low back area as well as bilateral greater trochanter of the hips   Sacral thrust (prone) : Positive bilateral PSIS Lateral compression: Positive at the greater trochanter FABER's: Negative Distraction (supine): Negative Thigh thrust test:       Assessment & Plan:   #1.  Fibromyalgia syndrome we discussed that her dose is low and that there is certainly room to wait dosing.  Is a 75 twice daily.  If this still is not helpful after a month patient can call back higher dose I will see her back in approximately 3 months. 2.  Sacroiliac disorder with only 1/positive provocative testing.  Hold off on any interventions.  If this becomes more consistently a major pain generator would consider referral to orthospine surgery for SI fusion

## 2023-03-02 NOTE — Telephone Encounter (Signed)
 Please call to schedule visco injections.  Approved for Orthovisc, Bilateral knee(s). [employer] Deductible applies Once the deductible has been met $500 (Met $500) patient is responsible for copay $35 co-pay Once the OOP has been met $3500 (met $3500) patient is covered at 100% Prior authorization not required

## 2023-03-03 DIAGNOSIS — H18613 Keratoconus, stable, bilateral: Secondary | ICD-10-CM | POA: Diagnosis not present

## 2023-03-03 DIAGNOSIS — H0288B Meibomian gland dysfunction left eye, upper and lower eyelids: Secondary | ICD-10-CM | POA: Diagnosis not present

## 2023-03-03 DIAGNOSIS — H0589 Other disorders of orbit: Secondary | ICD-10-CM | POA: Diagnosis not present

## 2023-03-03 DIAGNOSIS — Z961 Presence of intraocular lens: Secondary | ICD-10-CM | POA: Diagnosis not present

## 2023-03-03 DIAGNOSIS — H0288A Meibomian gland dysfunction right eye, upper and lower eyelids: Secondary | ICD-10-CM | POA: Diagnosis not present

## 2023-03-11 NOTE — Progress Notes (Deleted)
 Office Visit Note  Patient: Tracey Rodriguez             Date of Birth: 1960-11-24           MRN: [id number]             PCP: Frederik Charleston, MD Referring: Frederik Charleston, MD Visit Date: 03/25/2023 Occupation: @GUAROCC @  Subjective:    History of Present Illness: Tracey Rodriguez is a 63 y.o. female with history of osteoarthritis and DDD.    Lyrica   Robaxin    Bilateral knee visco in September/October 2023   Activities of Daily Living:  Patient reports morning stiffness for *** {minute/hour:19697}.   Patient {ACTIONS;DENIES/REPORTS:21021675::Denies} nocturnal pain.  Difficulty dressing/grooming: {ACTIONS;DENIES/REPORTS:21021675::Denies} Difficulty climbing stairs: {ACTIONS;DENIES/REPORTS:21021675::Denies} Difficulty getting out of chair: {ACTIONS;DENIES/REPORTS:21021675::Denies} Difficulty using hands for taps, buttons, cutlery, and/or writing: {ACTIONS;DENIES/REPORTS:21021675::Denies}  No Rheumatology ROS completed.   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   Vocal cord dysfunction 05/21/2020   Chronic cough 04/19/2020   Sensorineural hearing loss (SNHL), bilateral 04/04/2020   Osteopenia 12/01/2019   NAFLD (nonalcoholic fatty liver disease) 91/87/7978   Obesity (BMI 30.0-34.9) 09/29/2019   Hyperlipidemia 09/09/2018   Prediabetes 09/09/2018   Hx of adenomatous polyp of colon 02/04/2018   Rectocele 01/27/2018   Change in bowel habits 01/05/2018   Hemorrhoids 01/05/2018   Hoarseness 07/30/2017   Gastroesophageal reflux disease 07/30/2017   Pure hypercholesterolemia 06/03/2017   History of breast cancer 09/11/2016   Other fatigue 03/15/2016   Primary insomnia 03/15/2016   Primary osteoarthritis of both hands 03/15/2016   DDD cervical spine 03/15/2016   Osteoarthritis of lumbar spine  03/15/2016   History of bilateral carpal tunnel release 03/15/2016   Hypothyroidism 03/30/2013   Fibromyalgia 03/30/2013   Breast cancer (HCC) 03/30/2013   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   Dyspnea 02/02/2013   Restrictive lung disease 02/02/2013    Past Medical History:  Diagnosis Date   Arthritis    deg disc disease  - lower back, neck   Cancer (HCC)    left breast surgery-lumpectomy   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 (HCC)    Fibromyalgia    GERD (gastroesophageal reflux disease)    Heart murmur    Hx of adenomatous polyp of colon 02/04/2018   Hyperlipidemia    Hypothyroidism    goiter   NAFLD (nonalcoholic fatty liver disease)    Neuromuscular disorder (HCC)    sciatic nerve   PONV (postoperative nausea and vomiting)    Rectocele 01/27/2018   SVD (spontaneous vaginal delivery)    x 2    Family History  Problem Relation Age of Onset   Breast cancer Mother 24   Ovarian cancer Mother    Heart disease Father 25   Heart attack Father    Clotting disorder Father    Rheumatologic disease Father    Crohn's disease Father    Esophageal cancer Brother    Heart attack Brother    Cancer Brother    Lung cancer Brother    Colon polyps Brother    Throat cancer Brother    Colon polyps Brother    Stroke Brother    Breast cancer Maternal Aunt 60   Esophageal cancer Maternal Uncle    Stroke Paternal Aunt    Heart disease Paternal Aunt  Kidney disease Paternal Uncle    Liver cancer Maternal Grandmother    Heart disease Maternal Grandfather    Lung cancer Maternal Grandfather    Irritable bowel syndrome Son    Colon cancer Neg Hx    Rectal cancer Neg Hx    Stomach cancer Neg Hx    Past Surgical History:  Procedure Laterality Date   ABLATION     ANTERIOR (CYSTOCELE) AND POSTERIOR REPAIR (RECTOCELE) WITH XENFORM GRAFT AND SACROSPINOUS FIXATION  09/10/2020   APPENDECTOMY     BILATERAL SALPINGOOPHORECTOMY  09/10/2020   BREAST EXCISIONAL BIOPSY Left     BREAST SURGERY     left- lumpectomy   DILATATION & CURRETTAGE/HYSTEROSCOPY WITH RESECTOCOPE N/A 04/05/2012   Procedure: hysteroscopy with endocervical curretting;  Surgeon: Donna Just, DO;  Location: [facility];  Service: Gynecology;  Laterality: N/A;   DILATION AND CURETTAGE OF UTERUS     EYE SURGERY Bilateral 10/2017   cataract extraction    LIVER BIOPSY  09/2018   NOSE SURGERY     VAGINAL HYSTERECTOMY  09/10/2020   Social History   Social History Narrative   She is married, has children.  They are grown.  1 son and 1 daughter has at least 1 grandchild   Former smoker no significant alcohol no drug use   Immunization History  Administered Date(s) Administered   Hepatitis A, Adult 04/20/2014, 10/25/2014   Influenza Split 11/14/2008, 03/04/2011, 03/16/2012, 11/17/2012, 12/10/2017   Influenza,inj,Quad PF,6+ Mos 12/19/2016, 01/05/2018, 11/19/2018, 02/01/2020   Influenza,inj,quad, With Preservative 03/07/2014   Influenza-Unspecified 01/26/2014, 11/24/2016, 01/23/2019   PFIZER(Purple Top)SARS-COV-2 Vaccination 04/27/2019, 05/18/2019   Pneumococcal Conjugate-13 03/07/2014   Pneumococcal Polysaccharide-23 02/01/2020   Td 04/28/2005   Tdap 10/10/2014     Objective: Vital Signs: There were no vitals taken for this visit.   Physical Exam Vitals and nursing note reviewed.  Constitutional:      Appearance: She is well-developed.  HENT:     Head: Normocephalic and atraumatic.  Eyes:     Conjunctiva/sclera: Conjunctivae normal.  Cardiovascular:     Rate and Rhythm: Normal rate and regular rhythm.     Heart sounds: Normal heart sounds.  Pulmonary:     Effort: Pulmonary effort is normal.     Breath sounds: Normal breath sounds.  Abdominal:     General: Bowel sounds are normal.     Palpations: Abdomen is soft.  Musculoskeletal:     Cervical back: Normal range of motion.  Lymphadenopathy:     Cervical: No cervical adenopathy.  Skin:    General: Skin is warm and dry.      Capillary Refill: Capillary refill takes less than 2 seconds.  Neurological:     Mental Status: She is alert and oriented to person, place, and time.  Psychiatric:        Behavior: Behavior normal.      Musculoskeletal Exam: ***  CDAI Exam: CDAI Score: -- Patient Global: --; Provider Global: -- Swollen: --; Tender: -- Joint Exam 03/25/2023   No joint exam has been documented for this visit   There is currently no information documented on the homunculus. Go to the Rheumatology activity and complete the homunculus joint exam.  Investigation: No additional findings.  Imaging: No results found.  Recent Labs: Lab Results  Component Value Date   WBC 9.7 04/29/2022   HGB 13.8 04/29/2022   PLT 385.0 04/29/2022   NA 139 10/30/2020   K 4.2 10/30/2020   CL 103 10/30/2020   CO2 28 10/30/2020  GLUCOSE 96 10/30/2020   BUN 18 10/30/2020   CREATININE 0.80 10/30/2020   BILITOT 0.3 10/30/2020   ALKPHOS 70 10/30/2020   AST 20 10/30/2020   ALT 26 10/30/2020   PROT 7.3 10/30/2020   ALBUMIN 4.0 10/30/2020   CALCIUM 9.5 10/30/2020   GFRAA 83 09/08/2018    Speciality Comments: No specialty comments available.  Procedures:  No procedures performed Allergies: Amlodipine, Capsaicin, Etodolac, Sulfa antibiotics, Solifenacin, Solifenacin succinate, Bee venom, Pepto-bismol [bismuth], Pork-derived products, Rosuvastatin, and Sulfonamide derivatives   Assessment / Plan:     Visit Diagnoses: Primary osteoarthritis of both hands  History of bilateral carpal tunnel release  Primary osteoarthritis of both knees  Trochanteric bursitis of both hips  Pain in both feet  DDD (degenerative disc disease), cervical  Degeneration of intervertebral disc of lumbar region without discogenic back pain or lower extremity pain  Chronic SI joint pain  Fibromyalgia  Other fatigue  Primary insomnia  Factor V deficiency (HCC)  History of COPD  History of breast cancer  Orders: No  orders of the defined types were placed in this encounter.  No orders of the defined types were placed in this encounter.   Follow-Up Instructions: No follow-ups on file.   Waddell CHRISTELLA Craze, PA-C  Note - This record has been created using Dragon software.  Chart creation errors have been sought, but may not always  have been located. Such creation errors do not reflect on  the standard of medical care.

## 2023-03-25 ENCOUNTER — Ambulatory Visit: Payer: BC Managed Care – PPO | Admitting: Physician Assistant

## 2023-03-25 DIAGNOSIS — M797 Fibromyalgia: Secondary | ICD-10-CM

## 2023-03-25 DIAGNOSIS — G8929 Other chronic pain: Secondary | ICD-10-CM

## 2023-03-25 DIAGNOSIS — M51369 Other intervertebral disc degeneration, lumbar region without mention of lumbar back pain or lower extremity pain: Secondary | ICD-10-CM

## 2023-03-25 DIAGNOSIS — F5101 Primary insomnia: Secondary | ICD-10-CM

## 2023-03-25 DIAGNOSIS — M5030 Other cervical disc degeneration, unspecified cervical region: Secondary | ICD-10-CM

## 2023-03-25 DIAGNOSIS — Z9889 Other specified postprocedural states: Secondary | ICD-10-CM

## 2023-03-25 DIAGNOSIS — Z853 Personal history of malignant neoplasm of breast: Secondary | ICD-10-CM

## 2023-03-25 DIAGNOSIS — Z8709 Personal history of other diseases of the respiratory system: Secondary | ICD-10-CM

## 2023-03-25 DIAGNOSIS — R5383 Other fatigue: Secondary | ICD-10-CM

## 2023-03-25 DIAGNOSIS — M7061 Trochanteric bursitis, right hip: Secondary | ICD-10-CM

## 2023-03-25 DIAGNOSIS — M170 Bilateral primary osteoarthritis of knee: Secondary | ICD-10-CM

## 2023-03-25 DIAGNOSIS — M19041 Primary osteoarthritis, right hand: Secondary | ICD-10-CM

## 2023-03-25 DIAGNOSIS — M79671 Pain in right foot: Secondary | ICD-10-CM

## 2023-03-25 DIAGNOSIS — D682 Hereditary deficiency of other clotting factors: Secondary | ICD-10-CM

## 2023-04-01 ENCOUNTER — Ambulatory Visit: Payer: BC Managed Care – PPO | Attending: Physician Assistant | Admitting: Physician Assistant

## 2023-04-01 ENCOUNTER — Encounter: Payer: Self-pay | Admitting: Physician Assistant

## 2023-04-01 VITALS — BP 113/78 | HR 69 | Resp 15 | Ht 66.0 in | Wt 199.8 lb

## 2023-04-01 DIAGNOSIS — M47816 Spondylosis without myelopathy or radiculopathy, lumbar region: Secondary | ICD-10-CM

## 2023-04-01 DIAGNOSIS — M19042 Primary osteoarthritis, left hand: Secondary | ICD-10-CM

## 2023-04-01 DIAGNOSIS — F5101 Primary insomnia: Secondary | ICD-10-CM

## 2023-04-01 DIAGNOSIS — Z8709 Personal history of other diseases of the respiratory system: Secondary | ICD-10-CM

## 2023-04-01 DIAGNOSIS — M79672 Pain in left foot: Secondary | ICD-10-CM

## 2023-04-01 DIAGNOSIS — R5383 Other fatigue: Secondary | ICD-10-CM

## 2023-04-01 DIAGNOSIS — M797 Fibromyalgia: Secondary | ICD-10-CM

## 2023-04-01 DIAGNOSIS — Z9889 Other specified postprocedural states: Secondary | ICD-10-CM | POA: Diagnosis not present

## 2023-04-01 DIAGNOSIS — D682 Hereditary deficiency of other clotting factors: Secondary | ICD-10-CM

## 2023-04-01 DIAGNOSIS — M7062 Trochanteric bursitis, left hip: Secondary | ICD-10-CM

## 2023-04-01 DIAGNOSIS — Z853 Personal history of malignant neoplasm of breast: Secondary | ICD-10-CM

## 2023-04-01 DIAGNOSIS — M79671 Pain in right foot: Secondary | ICD-10-CM

## 2023-04-01 DIAGNOSIS — M7061 Trochanteric bursitis, right hip: Secondary | ICD-10-CM

## 2023-04-01 DIAGNOSIS — M19041 Primary osteoarthritis, right hand: Secondary | ICD-10-CM

## 2023-04-01 DIAGNOSIS — M170 Bilateral primary osteoarthritis of knee: Secondary | ICD-10-CM | POA: Diagnosis not present

## 2023-04-01 DIAGNOSIS — M533 Sacrococcygeal disorders, not elsewhere classified: Secondary | ICD-10-CM

## 2023-04-01 DIAGNOSIS — M5030 Other cervical disc degeneration, unspecified cervical region: Secondary | ICD-10-CM

## 2023-04-01 DIAGNOSIS — G8929 Other chronic pain: Secondary | ICD-10-CM

## 2023-04-01 MED ORDER — LIDOCAINE HCL 1 % IJ SOLN
1.5000 mL | INTRAMUSCULAR | Status: AC | PRN
Start: 2023-04-01 — End: 2023-04-01
  Administered 2023-04-01: 1.5 mL

## 2023-04-01 MED ORDER — HYALURONAN 30 MG/2ML IX SOSY
30.0000 mg | PREFILLED_SYRINGE | INTRA_ARTICULAR | Status: AC | PRN
Start: 2023-04-01 — End: 2023-04-01
  Administered 2023-04-01: 30 mg via INTRA_ARTICULAR

## 2023-04-01 NOTE — Progress Notes (Addendum)
Office Visit Note  Patient: Tracey Rodriguez             Date of Birth: 1960/07/12           MRN: [id number]             PCP: Henrine Screws, MD Referring: Henrine Screws, MD Visit Date: 04/01/2023 Occupation: @GUAROCC @  Subjective:  Orthovisc injections for both knees  History of Present Illness: Tracey Rodriguez is a 63 y.o. female with history of osteoarthritis and fibromyalgia.  She takes Lyrica 75 mg 1 capsule by mouth twice daily and methocarbamol 500 mg 1 tablet daily as needed for muscle spasms.  Patient presents today for the first injection of the Orthovisc series for both knees.  Patient continues to experience intermittent pain and stiffness involving both knees.  She has difficulty rising from a squatting position as well as difficulty climbing steps and rising from a seated position.  She denies any swelling in her knee joints currently.  She continues to experience intermittent discomfort in her lower back due to scoliosis.  She has intermittent discomfort and stiffness in both hands.    Activities of Daily Living:  Patient reports morning stiffness for 20 minutes.   Patient Reports nocturnal pain.  Difficulty dressing/grooming: Reports Difficulty climbing stairs: Reports Difficulty getting out of chair: Reports Difficulty using hands for taps, buttons, cutlery, and/or writing: Reports  Review of Systems  Constitutional:  Negative for fatigue.  HENT:  Negative for mouth sores, mouth dryness and nose dryness.   Eyes:  Positive for dryness. Negative for pain.  Respiratory:  Positive for shortness of breath. Negative for difficulty breathing.        On exertion   Cardiovascular:  Negative for chest pain and palpitations.  Gastrointestinal:  Positive for blood in stool and constipation. Negative for diarrhea.  Endocrine: Positive for increased urination.  Genitourinary:  Positive for involuntary urination.  Musculoskeletal:  Positive for joint pain, joint pain,  joint swelling, myalgias, muscle weakness, morning stiffness, muscle tenderness and myalgias. Negative for gait problem.  Skin:  Positive for color change, hair loss and sensitivity to sunlight. Negative for rash.  Allergic/Immunologic: Positive for susceptible to infections.  Neurological:  Negative for dizziness and headaches.  Hematological:  Negative for swollen glands.  Psychiatric/Behavioral:  Positive for sleep disturbance. Negative for depressed mood. The patient is not nervous/anxious.     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   Vocal cord dysfunction 05/21/2020   Chronic cough 04/19/2020   Sensorineural hearing loss (SNHL), bilateral 04/04/2020   Osteopenia 12/01/2019   NAFLD (nonalcoholic fatty liver disease) 41/32/4401   Obesity (BMI 30.0-34.9) 09/29/2019   Hyperlipidemia 09/09/2018   Prediabetes 09/09/2018   Hx of adenomatous polyp of colon 02/04/2018   Rectocele 01/27/2018   Change in bowel habits 01/05/2018   Hemorrhoids 01/05/2018   Hoarseness 07/30/2017   Gastroesophageal reflux disease 07/30/2017   Pure hypercholesterolemia 06/03/2017   History of breast cancer 09/11/2016   Other fatigue 03/15/2016   Primary insomnia 03/15/2016   Primary osteoarthritis of both hands 03/15/2016   DDD cervical spine 03/15/2016   Osteoarthritis of lumbar spine 03/15/2016   History of bilateral carpal tunnel release 03/15/2016   Hypothyroidism 03/30/2013   Fibromyalgia 03/30/2013   Breast cancer (HCC) 03/30/2013   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  Dyspnea 02/02/2013   Restrictive lung disease 02/02/2013    Past Medical History:  Diagnosis Date   Arthritis    deg disc disease  - lower back, neck   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, behind right eye   Cancer (HCC)    left breast surgery-lumpectomy    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 (HCC)    Fibromyalgia    GERD (gastroesophageal reflux disease)    Heart murmur    Hx of adenomatous polyp of colon 02/04/2018   Hyperlipidemia    Hypothyroidism    goiter   NAFLD (nonalcoholic fatty liver disease)    Neuromuscular disorder (HCC)    sciatic nerve   PONV (postoperative nausea and vomiting)    Rectocele 01/27/2018   SVD (spontaneous vaginal delivery)    x 2    Family History  Problem Relation Age of Onset   Breast cancer Mother 23   Ovarian cancer Mother    Heart disease Father 93   Heart attack Father    Clotting disorder Father    Rheumatologic disease Father    Crohn's disease Father    Esophageal cancer Brother    Heart attack Brother    Cancer Brother    Lung cancer Brother    Colon polyps Brother    Throat cancer Brother    Colon polyps Brother    Stroke Brother    Breast cancer Maternal Aunt 60   Esophageal cancer Maternal Uncle    Stroke Paternal Aunt    Heart disease Paternal Aunt    Kidney disease Paternal Uncle    Liver cancer Maternal Grandmother    Heart disease Maternal Grandfather    Lung cancer Maternal Grandfather    Irritable bowel syndrome Son    Colon cancer Neg Hx    Rectal cancer Neg Hx    Stomach cancer Neg Hx    Past Surgical History:  Procedure Laterality Date   ABLATION     ANTERIOR (CYSTOCELE) AND POSTERIOR REPAIR (RECTOCELE) WITH XENFORM GRAFT AND SACROSPINOUS FIXATION  09/10/2020   APPENDECTOMY     BILATERAL SALPINGOOPHORECTOMY  09/10/2020   BREAST EXCISIONAL BIOPSY Left    BREAST SURGERY     left- lumpectomy   DILATATION & CURRETTAGE/HYSTEROSCOPY WITH RESECTOCOPE N/A 04/05/2012   Procedure: hysteroscopy with endocervical curretting;  Surgeon: Philip Aspen, DO;  Location: [facility];  Service: Gynecology;  Laterality: N/A;   DILATION AND CURETTAGE OF UTERUS     EYE SURGERY Bilateral 10/2017   cataract extraction    LIVER BIOPSY  09/2018   NOSE SURGERY     VAGINAL  HYSTERECTOMY  09/10/2020   Social History   Social History Narrative   She is married, has children.  They are grown.  1 son and 1 daughter has at least 1 grandchild   Former smoker no significant alcohol no drug use   Immunization History  Administered Date(s) Administered   Hepatitis A, Adult 04/20/2014, 10/25/2014   Influenza Split 11/14/2008, 03/04/2011, 03/16/2012, 11/17/2012, 12/10/2017   Influenza,inj,Quad PF,6+ Mos 12/19/2016, 01/05/2018, 11/19/2018, 02/01/2020   Influenza,inj,quad, With Preservative 03/07/2014   Influenza-Unspecified 01/26/2014, 11/24/2016, 01/23/2019   PFIZER(Purple Top)SARS-COV-2 Vaccination 04/27/2019, 05/18/2019   Pneumococcal Conjugate-13 03/07/2014   Pneumococcal Polysaccharide-23 02/01/2020   Td 04/28/2005   Tdap 10/10/2014     Objective: Vital Signs: BP 113/78 (BP Location: Left Arm, Patient Position: Sitting, Cuff Size: Normal)   Pulse 69   Resp 15   Ht 5\' 6"  (1.676 m)  Wt 199 lb 12.8 oz (90.6 kg)   BMI 32.25 kg/m    Physical Exam Vitals and nursing note reviewed.  Constitutional:      Appearance: She is well-developed.  HENT:     Head: Normocephalic and atraumatic.  Eyes:     Conjunctiva/sclera: Conjunctivae normal.  Cardiovascular:     Rate and Rhythm: Normal rate and regular rhythm.     Heart sounds: Normal heart sounds.  Pulmonary:     Effort: Pulmonary effort is normal.     Breath sounds: Normal breath sounds.  Abdominal:     General: Bowel sounds are normal.     Palpations: Abdomen is soft.  Musculoskeletal:     Cervical back: Normal range of motion.  Lymphadenopathy:     Cervical: No cervical adenopathy.  Skin:    General: Skin is warm and dry.     Capillary Refill: Capillary refill takes less than 2 seconds.  Neurological:     Mental Status: She is alert and oriented to person, place, and time.  Psychiatric:        Behavior: Behavior normal.      Musculoskeletal Exam: Generalized hyperalgesia and positive tender  points on exam.  C-spine has slightly limited range of motion. Thoracolumbar scoliosis. Trapezius muscle tension tenderness bilaterally.  Shoulder joints, elbow joints, wrist joints, MCPs, PIPs, DIPs have good range of motion with no synovitis.  Complete fist formation bilaterally.  Hip joints have good range of motion with no groin pain.  Knee joints have good range of motion no warmth or effusion.  Ankle joints have good range of motion with no tenderness or joint swelling.  CDAI Exam: CDAI Score: -- Patient Global: --; Provider Global: -- Swollen: --; Tender: -- Joint Exam 04/01/2023   No joint exam has been documented for this visit   There is currently no information documented on the homunculus. Go to the Rheumatology activity and complete the homunculus joint exam.  Investigation: No additional findings.  Imaging: No results found.  Recent Labs: Lab Results  Component Value Date   WBC 9.7 04/29/2022   HGB 13.8 04/29/2022   PLT 385.0 04/29/2022   NA 139 10/30/2020   K 4.2 10/30/2020   CL 103 10/30/2020   CO2 28 10/30/2020   GLUCOSE 96 10/30/2020   BUN 18 10/30/2020   CREATININE 0.80 10/30/2020   BILITOT 0.3 10/30/2020   ALKPHOS 70 10/30/2020   AST 20 10/30/2020   ALT 26 10/30/2020   PROT 7.3 10/30/2020   ALBUMIN 4.0 10/30/2020   CALCIUM 9.5 10/30/2020   GFRAA 83 09/08/2018    Speciality Comments: No specialty comments available.  Procedures:  Large Joint Inj: bilateral knee on 04/01/2023 2:36 PM Indications: pain Details: 25 G 1.5 in needle, medial approach  Arthrogram: No  Medications (Right): 1.5 mL lidocaine 1 %; 30 mg Hyaluronan 30 MG/2ML Aspirate (Right): 0 mL Medications (Left): 1.5 mL lidocaine 1 %; 30 mg Hyaluronan 30 MG/2ML Aspirate (Left): 0 mL Outcome: tolerated well, no immediate complications Procedure, treatment alternatives, risks and benefits explained, specific risks discussed. Consent was given by the patient. Immediately prior to  procedure a time out was called to verify the correct patient, procedure, equipment, support staff and site/side marked as required. Patient was prepped and draped in the usual sterile fashion.     Allergies: Amlodipine, Capsaicin, Etodolac, Sulfa antibiotics, Solifenacin, Solifenacin succinate, Bee venom, Pepto-bismol [bismuth], Pork-derived products, Rosuvastatin, and Sulfonamide derivatives   Assessment / Plan:     Visit Diagnoses:  Primary osteoarthritis of both hands: She has PIP and DIP thickening consistent with osteoarthritis of both hands.  No synovitis noted on examination today.  Complete fist formation bilaterally.  She experiences intermittent discomfort and stiffness due to underlying osteoarthritis but has no active inflammation.  Discussed the importance of joint protection and muscle strengthening.  History of bilateral carpal tunnel release: Not currently symptomatic.   Primary osteoarthritis of both knees - She previously had a good response to Orthovisc injections performed for bilateral knees 10/2021-11/2021.   She has good range of motion of both knee joints on examination today.  No warmth or effusion noted.   She presents today for the first Orthovisc injection of the series for both knees.  She tolerated the procedures well.  Procedure notes were completed above.  Aftercare was discussed.  Trochanteric bursitis of both hips: Patient continues to experience intermittent discomfort on the lateral aspect of both hips consistent with trochanteric bursitis.  She performs stretching exercises on a regular basis which have been helpful at alleviating her symptoms.  Pain in both feet: She has good range of motion of both ankle joints with no joint tenderness or joint swelling.  DDD (degenerative disc disease), cervical - She has limited range of motion with lateral rotation.  She is taking Lyrica 75 mg 1 capsule twice daily and methocarbamol 500 mg 1 tablet daily as needed for  muscle spasms.  Followed by Dr. Claudette Laws.  Spondylosis of lumbar spine: Patient has chronic pain in her lower back which she attributes to scoliosis.  She does not plan to proceed with surgical intervention at this time.  Chronic SI joint pain - Followed by Dr. Claudette Laws.  Fibromyalgia: She continues to experience intermittent myalgias and muscle tenderness due to fibromyalgia.  She is taking Lyrica as prescribed and methocarbamol as needed during flares.  Discussed the importance of regular exercise and good sleep hygiene.  Other fatigue: Chronic, stable.  Primary insomnia: Discussed the importance of good sleep hygiene.  Other medical conditions are listed as follows:  Factor V deficiency (HCC)  History of COPD  History of breast cancer  Orders: Orders Placed This Encounter  Procedures   Large Joint Inj   No orders of the defined types were placed in this encounter.    Follow-Up Instructions: Return in about 6 months (around 09/29/2023) for Fibromyalgia, DDD, Osteoarthritis.   Gearldine Bienenstock, PA-C  Note - This record has been created using Dragon software.  Chart creation errors have been sought, but may not always  have been located. Such creation errors do not reflect on  the standard of medical care.

## 2023-04-03 DIAGNOSIS — N3946 Mixed incontinence: Secondary | ICD-10-CM | POA: Diagnosis not present

## 2023-04-08 ENCOUNTER — Ambulatory Visit: Payer: BC Managed Care – PPO | Admitting: Physician Assistant

## 2023-04-14 ENCOUNTER — Ambulatory Visit: Payer: BC Managed Care – PPO | Attending: Physician Assistant | Admitting: Physician Assistant

## 2023-04-14 DIAGNOSIS — M19041 Primary osteoarthritis, right hand: Secondary | ICD-10-CM

## 2023-04-14 DIAGNOSIS — M170 Bilateral primary osteoarthritis of knee: Secondary | ICD-10-CM

## 2023-04-14 DIAGNOSIS — M19042 Primary osteoarthritis, left hand: Secondary | ICD-10-CM | POA: Diagnosis not present

## 2023-04-14 MED ORDER — HYALURONAN 30 MG/2ML IX SOSY
30.0000 mg | PREFILLED_SYRINGE | INTRA_ARTICULAR | Status: AC | PRN
Start: 2023-04-14 — End: 2023-04-14
  Administered 2023-04-14: 30 mg via INTRA_ARTICULAR

## 2023-04-14 MED ORDER — LIDOCAINE HCL 1 % IJ SOLN
1.5000 mL | INTRAMUSCULAR | Status: AC | PRN
Start: 2023-04-14 — End: 2023-04-14
  Administered 2023-04-14: 1.5 mL

## 2023-04-14 NOTE — Progress Notes (Addendum)
   Procedure Note  Patient: RUTHIE BERCH             Date of Birth: Aug 09, 1960           MRN: [id number]             Visit Date: 04/14/2023  Procedures: Visit Diagnoses:  1. Primary osteoarthritis of both hands   2. Primary osteoarthritis of both knees     Orthovisc #2 bilateral knees, B/B Large Joint Inj: bilateral knee on 04/14/2023 2:07 PM Indications: pain Details: 25 G 1.5 in needle, medial approach  Arthrogram: No  Medications (Right): 1.5 mL lidocaine 1 %; 30 mg Hyaluronan 30 MG/2ML Aspirate (Right): 0 mL Medications (Left): 1.5 mL lidocaine 1 %; 30 mg Hyaluronan 30 MG/2ML Aspirate (Left): 0 mL Outcome: tolerated well, no immediate complications Procedure, treatment alternatives, risks and benefits explained, specific risks discussed. Consent was given by the patient.     Patient tolerated the procedures well.  Aftercare was discussed.  Sherron Ales, PA-C

## 2023-04-17 DIAGNOSIS — N3281 Overactive bladder: Secondary | ICD-10-CM | POA: Diagnosis not present

## 2023-04-17 DIAGNOSIS — N3941 Urge incontinence: Secondary | ICD-10-CM | POA: Diagnosis not present

## 2023-04-17 DIAGNOSIS — R350 Frequency of micturition: Secondary | ICD-10-CM | POA: Diagnosis not present

## 2023-04-20 ENCOUNTER — Other Ambulatory Visit: Payer: Self-pay | Admitting: Urology

## 2023-04-21 ENCOUNTER — Other Ambulatory Visit: Payer: Self-pay | Admitting: Urology

## 2023-04-21 ENCOUNTER — Ambulatory Visit: Payer: BC Managed Care – PPO | Attending: Physician Assistant | Admitting: Physician Assistant

## 2023-04-21 DIAGNOSIS — M170 Bilateral primary osteoarthritis of knee: Secondary | ICD-10-CM

## 2023-04-21 DIAGNOSIS — E669 Obesity, unspecified: Secondary | ICD-10-CM | POA: Diagnosis not present

## 2023-04-21 DIAGNOSIS — Z713 Dietary counseling and surveillance: Secondary | ICD-10-CM | POA: Diagnosis not present

## 2023-04-21 MED ORDER — LIDOCAINE HCL 1 % IJ SOLN
1.5000 mL | INTRAMUSCULAR | Status: AC | PRN
  Administered 2023-04-21: 1.5 mL

## 2023-04-21 MED ORDER — HYALURONAN 30 MG/2ML IX SOSY
30.0000 mg | PREFILLED_SYRINGE | INTRA_ARTICULAR | Status: AC | PRN
Start: 2023-04-21 — End: 2023-04-21
  Administered 2023-04-21: 30 mg via INTRA_ARTICULAR

## 2023-04-21 MED ORDER — HYALURONAN 30 MG/2ML IX SOSY
30.0000 mg | PREFILLED_SYRINGE | INTRA_ARTICULAR | Status: AC | PRN
  Administered 2023-04-21: 30 mg via INTRA_ARTICULAR

## 2023-04-21 NOTE — Progress Notes (Signed)
   Procedure Note  Patient: Tracey Rodriguez             Date of Birth: May 19, 1960           MRN: [id number]             Visit Date: 04/21/2023  Procedures: Visit Diagnoses:  1. Primary osteoarthritis of both knees    Orthovisc #3 bilateral knees, B/B Large Joint Inj: bilateral knee on 04/21/2023 8:51 AM Indications: pain Details: 25 G 1.5 in needle, medial approach  Arthrogram: No  Medications (Right): 1.5 mL lidocaine 1 %; 30 mg Hyaluronan 30 MG/2ML Aspirate (Right): 0 mL Medications (Left): 1.5 mL lidocaine 1 %; 30 mg Hyaluronan 30 MG/2ML Aspirate (Left): 0 mL Outcome: tolerated well, no immediate complications Procedure, treatment alternatives, risks and benefits explained, specific risks discussed. Consent was given by the patient. Immediately prior to procedure a time out was called to verify the correct patient, procedure, equipment, support staff and site/side marked as required.     Patient tolerated the procedures well. Aftercare was discussed.  Sherron Ales, PA-C

## 2023-04-22 NOTE — Progress Notes (Addendum)
 Anesthesia Review:  PCP: Henrine Screws  Cardiologist :  Eldridge Dace - LOV 08/14/22   PPM/ ICD: Device Orders: Rep Notified:  Chest x-ray : EKG : 08/11/22  Echo : 2022  CT Cors- 2020  Stress test: Cardiac Cath :   Activity level: can do a flight of stairs without dificutly  Sleep Study/ CPAP : none  Fasting Blood Sugar :      / Checks Blood Sugar -- times a day:   Prediabetes-  Hgba1c- 02/26/23- 5.6  Metformin-  none am of procedure , currently on hold sees MD on 04/28/23  Zepbound - Last dose on 04/20/23   Blood Thinner/ Instructions /Last Dose: ASA / Instructions/ Last Dose :    Leiden Factor 5 no blood thinners    Preop phone appt completed on 04/27/23.  Med hx and preop instructions completed. PT aware at end of phone call to call Admitting at [phone]. Pt also has some concerns in regards to Insurance coverage questions.  Directed pt to Admitting.

## 2023-04-24 NOTE — Patient Instructions (Signed)
 SURGICAL WAITING ROOM VISITATION  Patients having surgery or a procedure may have no more than 2 support people in the waiting area - these visitors may rotate.    Children under the age of 62 must have an adult with them who is not the patient.  Due to an increase in RSV and influenza rates and associated hospitalizations, children ages 78 and under may not visit patients in [facility] hospitals.  Visitors with respiratory illnesses are discouraged from visiting and should remain at home.  If the patient needs to stay at the hospital during part of their recovery, the visitor guidelines for inpatient rooms apply. Pre-op nurse will coordinate an appropriate time for 1 support person to accompany patient in pre-op.  This support person may not rotate.    Please refer to the [facility] website for the visitor guidelines for Inpatients (after your surgery is over and you are in a regular room).       Your procedure is scheduled on:  04/30/2023    Report to [facility] Main Entrance    Report to admitting at   [phone]   Call this number if you have problems the morning of surgery [phone]   Do not eat food or drink liquids  :After Midnight.                   If you have questions, please contact your surgeon's office.       Oral Hygiene is also important to reduce your risk of infection.                                    Remember - BRUSH YOUR TEETH THE MORNING OF SURGERY WITH YOUR REGULAR TOOTHPASTE  DENTURES WILL BE REMOVED PRIOR TO SURGERY PLEASE DO NOT APPLY "Poly grip" OR ADHESIVES!!!   Do NOT smoke after Midnight   Stop all vitamins and herbal supplements 7 days before surgery.   Take these medicines the morning of surgery with A SIP OF WATER:  inhalers as usual and bring, dexilant, pepcid if needed, flonse toprol, lyrica, claritin, gemtesa, synthroid             Metformin- none am of surgery            Zepbound- Hold for 7 days prior to procedure.   Last dose on   DO NOT TAKE ANY ORAL DIABETIC MEDICATIONS DAY OF YOUR SURGERY  Bring CPAP mask and tubing day of surgery.                              You may not have any metal on your body including hair pins, jewelry, and body piercing             Do not wear make-up, lotions, powders, perfumes/cologne, or deodorant  Do not wear nail polish including gel and S&S, artificial/acrylic nails, or any other type of covering on natural nails including finger and toenails. If you have artificial nails, gel coating, etc. that needs to be removed by a nail salon please have this removed prior to surgery or surgery may need to be canceled/ delayed if the surgeon/ anesthesia feels like they are unable to be safely monitored.   Do not shave  48 hours prior to surgery.  Men may shave face and neck.   Do not bring valuables to the hospital. [facility] IS NOT             RESPONSIBLE   FOR VALUABLES.   Contacts, glasses, dentures or bridgework may not be worn into surgery.   Bring small overnight bag day of surgery.   DO NOT BRING YOUR HOME MEDICATIONS TO THE HOSPITAL. PHARMACY WILL DISPENSE MEDICATIONS LISTED ON YOUR MEDICATION LIST TO YOU DURING YOUR ADMISSION IN THE HOSPITAL!    Patients discharged on the day of surgery will not be allowed to drive home.  Someone NEEDS to stay with you for the first 24 hours after anesthesia.   Special Instructions: Bring a copy of your healthcare power of attorney and living will documents the day of surgery if you haven't scanned them before.              Please read over the following fact sheets you were given: IF YOU HAVE QUESTIONS ABOUT YOUR PRE-OP INSTRUCTIONS PLEASE CALL [phone]   If you received a COVID test during your pre-op visit  it is requested that you wear a mask when out in public, stay away from anyone that may not be feeling well and notify your surgeon if you develop symptoms. If you test positive for Covid or have been in  contact with anyone that has tested positive in the last 10 days please notify you surgeon.     - Preparing for Surgery Before surgery, you can play an important role.  Because skin is not sterile, your skin needs to be as free of germs as possible.  You can reduce the number of germs on your skin by washing with CHG (chlorahexidine gluconate) soap before surgery.  CHG is an antiseptic cleaner which kills germs and bonds with the skin to continue killing germs even after washing. Please DO NOT use if you have an allergy to CHG or antibacterial soaps.  If your skin becomes reddened/irritated stop using the CHG and inform your nurse when you arrive at Short Stay. Do not shave (including legs and underarms) for at least 48 hours prior to the first CHG shower.  You may shave your face/neck. Please follow these instructions carefully:  1.  Shower with CHG Soap the night before surgery and the  morning of Surgery.  2.  If you choose to wash your hair, wash your hair first as usual with your  normal  shampoo.  3.  After you shampoo, rinse your hair and body thoroughly to remove the  shampoo.                           4.  Use CHG as you would any other liquid soap.  You can apply chg directly  to the skin and wash                       Gently with a scrungie or clean washcloth.  5.  Apply the CHG Soap to your body ONLY FROM THE NECK DOWN.   Do not use on face/ open                           Wound or open sores. Avoid contact with eyes, ears mouth and genitals (private parts).  Wash face,  Genitals (private parts) with your normal soap.             6.  Wash thoroughly, paying special attention to the area where your surgery  will be performed.  7.  Thoroughly rinse your body with warm water from the neck down.  8.  DO NOT shower/wash with your normal soap after using and rinsing off  the CHG Soap.                9.  Pat yourself dry with a clean towel.            10.  Wear  clean pajamas.            11.  Place clean sheets on your bed the night of your first shower and do not  sleep with pets. Day of Surgery : Do not apply any lotions/deodorants the morning of surgery.  Please wear clean clothes to the hospital/surgery center.  FAILURE TO FOLLOW THESE INSTRUCTIONS MAY RESULT IN THE CANCELLATION OF YOUR SURGERY PATIENT SIGNATURE_________________________________  NURSE SIGNATURE__________________________________  ________________________________________________________________________

## 2023-04-27 ENCOUNTER — Other Ambulatory Visit: Payer: Self-pay

## 2023-04-27 ENCOUNTER — Encounter (HOSPITAL_COMMUNITY)
Admission: RE | Admit: 2023-04-27 | Discharge: 2023-04-27 | Disposition: A | Discharge status: Home / Self Care | Payer: Self-pay | Source: Ambulatory Visit | Attending: Urology | Admitting: Urology

## 2023-04-27 ENCOUNTER — Encounter (HOSPITAL_COMMUNITY): Payer: Self-pay

## 2023-04-27 DIAGNOSIS — Z87891 Personal history of nicotine dependence: Secondary | ICD-10-CM | POA: Diagnosis not present

## 2023-04-27 DIAGNOSIS — N3281 Overactive bladder: Secondary | ICD-10-CM | POA: Diagnosis not present

## 2023-04-27 DIAGNOSIS — Z7984 Long term (current) use of oral hypoglycemic drugs: Secondary | ICD-10-CM | POA: Diagnosis not present

## 2023-04-27 DIAGNOSIS — E119 Type 2 diabetes mellitus without complications: Secondary | ICD-10-CM | POA: Diagnosis not present

## 2023-04-27 DIAGNOSIS — Z01818 Encounter for other preprocedural examination: Secondary | ICD-10-CM

## 2023-04-27 DIAGNOSIS — Z833 Family history of diabetes mellitus: Secondary | ICD-10-CM | POA: Diagnosis not present

## 2023-04-27 DIAGNOSIS — Z01812 Encounter for preprocedural laboratory examination: Secondary | ICD-10-CM | POA: Insufficient documentation

## 2023-04-27 DIAGNOSIS — I10 Essential (primary) hypertension: Secondary | ICD-10-CM | POA: Diagnosis not present

## 2023-04-27 DIAGNOSIS — Z8249 Family history of ischemic heart disease and other diseases of the circulatory system: Secondary | ICD-10-CM | POA: Diagnosis not present

## 2023-04-27 DIAGNOSIS — M1990 Unspecified osteoarthritis, unspecified site: Secondary | ICD-10-CM | POA: Diagnosis not present

## 2023-04-27 DIAGNOSIS — K219 Gastro-esophageal reflux disease without esophagitis: Secondary | ICD-10-CM | POA: Diagnosis not present

## 2023-04-27 HISTORY — DX: Unspecified asthma, uncomplicated: J45.909

## 2023-04-27 HISTORY — DX: Essential (primary) hypertension: I10

## 2023-04-27 HISTORY — DX: Prediabetes: R73.03

## 2023-04-27 NOTE — H&P (Signed)
 CC/HPI: cc: urinary frequency/urgency   07/24/21: 63 year old woman with a history of a negative gross hematuria work-up in 2018 also with overactive bladder. 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 She underwent hysterectomy with Dr. Ashley Royalty at [facility] in September 2022. She is using vaginal estradiol cream but feels like the vaginal vault is very tight. She continues to have stress incontinence and wears a pad which she changes twice a day. She also has a urinary urge incontinence. She drinks 1 cup of coffee in the morning and nothing else during the day. She recently went on a cruise and drink 4 glasses of water at dinner and was then voiding twice overnight. She is not thirsty and this is the reason for not drinking during the day. The Leslye Peer helps her go from voiding every 10 to 15 minutes to being able to make it to a bathroom on car rides.   Therapy she has tried: Myrbetriq, peripheral tibial nerve stimulation  Current therapy: Gemtesa  Therapy she is not interested in: Botox, InterStim   12/11/2021: 63 year old woman with a history of urinary frequency and urgency managed with Gemtesa here after a 3-week period of discolored urine. Patient states urine was orange/red-colored specially in the morning and evening. It did not change with hydration status. She had no pain associated with it. She came in and gave a urine sample and culture was negative at that time. Urinalysis however was not done so unsure if there is microscopic hematuria. Her urinalysis today is normal.    01/24/22: 63 year old woman with a history of urinary frequency and urgency managed with Gemtesa here for cystoscopy as part of hematuria evaluation. CT scan did not show any source of hematuria. Patient's discolored urine and hematuria has resolved.   05/28/22: 63 year old woman with a history of urinary urgency and frequency manage with Gemtesa who also had a negative hematuria workup in 2023 here  for follow-up. Patient is scheduled for colonoscopy next week. Overall the Leslye Peer continues to help her symptoms. Off of medication she was voiding every 10 minutes.   03/28/2023: 63 year old woman with longstanding urinary urgency and frequency refractory to medication here for follow-up. She had a urodynamic study that showed positive instability as well as urge incontinence. She is ready to move forward with Botox.   UDS SUMMARY  Mr. Tenaglia held a max capacity of approx. 309 mls. His 1st sensation was felt at 124 mls. No SUI noted. There was positive instability. A couple unstable contractions noted on the study. Some of these contraction were provoked from coughing. She felt an increased urgency but only leaked with one of these bladder spasms. Leakage was severe(251 ml). The spasm forced her pressure line to fall out. He was able to generate a voluntary contraction and void 167 ml with max flow of 18 ml/s. Max detrusor pressure while voiding was 31 cmH20. EMG leads were basically quiet during the voiding phase. PVR was approx. 32 mls. During cough and Valsalva, the bladder descended approximately 1-2 cm. No trabeculation was noted. No reflux was seen. She will return for UDS follow up.     ALLERGIES: Etodolac CAPS - Trouble Breathing Statins - Itching Sulfa Drugs - Vomiting VESIcare TABS - Other Reaction, unknown    MEDICATIONS: Doxycycline Hyclate 100 mg capsule  Gemtesa 75 mg tablet 1 tablet PO Daily  Gemtesa 75 mg tablet  Metoprolol Succinate 50 mg tablet, extended release 24 hr  Aspir 81 81 mg tablet, delayed release 1 tablet PO  Daily  Levothyroxine Sodium 50 mcg tablet Oral  Mupirocin 2 % ointment External  MVI  Nexium 40 mg capsule,delayed release Oral     GU PSH: Complex cystometrogram, w/ void pressure and urethral pressure profile studies, any technique - 04/03/2023 Complex Uroflow - 04/03/2023 Cystoscopy - 01/24/2022, 2018 D&C Non-OB - 2009 Emg surf Electrd -  04/03/2023 Inject For cystogram - 04/03/2023 Intrabd voidng Press - 04/03/2023 Locm 300-399Mg /Ml Iodine,1Ml - 12/30/2021, 2018       PSH Notes: Abdominal Surgery  Rhinoplasty   NON-GU PSH: Abortion - 2009 Carpal Tunnel Surgery.. - 2016 Neuroeltrd Stim Post Tibial - 2022, 2022, 2022, 2022, 2022, 2022, 2022, 2022, 2022, 2022, 2022, 2022 Reconstruct Nose - 2009 Remove Breast Lesion - 2009     GU PMH: Mixed incontinence - 04/03/2023, - 05/28/2022, - 07/24/2021, - 01/21/2021, - 11/08/2020 Urge incontinence - 04/03/2023, - 2022, - 2022, - 2022, - 2022, - 2022, - 2022, - 2022, - 2022, - 2022, - 2022, - 2022, - 2022, - 2022, Urge incontinence of urine, - 2016 Urinary Frequency - 05/28/2022, - 07/24/2021, - 01/21/2021 Urinary Urgency - 05/28/2022, - 07/24/2021, - 01/21/2021 Gross hematuria - 01/24/2022, - 12/30/2021, - 12/11/2021, - 2018 (Acute), With hx of significant tobacco use in the past will send urine for cytology and proceed with hematuria workup with CT and cysto , - 2018 Overactive bladder - 01/24/2022, - 07/24/2021, - 01/21/2021, - 11/08/2020, - 2022, - 2022, - 2022, - 2022, - 2022, - 2018, Overactive bladder, - 2016 Benign Neo Bladder - 2018 Chronic cystitis (w/o hematuria), Chronic cystitis - 2016 Straining on Urination, Straining on urination - 2014      PMH Notes:  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  2007-08-24 09:18:20 - Note: Nontoxic Multinodular Goiter  2007-08-24 09:18:20 - Note: Dyslipidemia   NON-GU PMH: Encounter for general adult medical examination without abnormal findings, Encounter for preventive health examination - 2016 Cardiac murmur, unspecified, Murmurs - 2014 Fibromyalgia, Fibromyalgia - 2014 Irritable bowel syndrome with diarrhea, Irritable Bowel Syndrome - 2014 Muscle weakness (generalized), Muscle weakness - 2014 Other lack of coordination, Other lack of coordination - 2014 Personal history of other benign neoplasm, History of uterine leiomyoma - 2014 Personal  history of other diseases of the digestive system, History of esophageal reflux - 2014 Personal history of other specified conditions, History of headache - 2014    FAMILY HISTORY: Acute Myocardial Infarction - Father Breast Cancer - Mother Diabetes - Father, Brother, Mother Father Deceased At [employer] ___ - Runs In Family Hepatitis - Runs In Family Hypertension - Father, Mother, Brother Mother Deceased At Age 61 from diabetic complicati - Runs In Family Murmurs - Mother Pure Hypercholesterolemia - Mother, Brother Renal Disease - Runs In Family Tuberculosis - Father Ulcer, Gastric - Father Venous Thrombosis Of The Deep Vessels Of The Dista - Father   SOCIAL HISTORY: Marital Status: Married Preferred Language: English; Ethnicity: Not Hispanic Or Latino; Race: White Current Smoking Status: Patient does not smoke anymore. Has not smoked since 06/17/1996. Smoked for 15 years. Smoked 3 packs per day.   Tobacco Use Assessment Completed: Used Tobacco in last 30 days? Does not use smokeless tobacco. Social Drinker.  Drinks 2 caffeinated drinks per day.     Notes: 1 son, 1 daughter   REVIEW OF SYSTEMS:    GU Review Female:   Patient denies frequent urination, hard to postpone urination, burning /pain with urination, get up at night to urinate, leakage of urine, stream starts and stops, trouble starting  your stream, have to strain to urinate, and being pregnant.  Gastrointestinal (Upper):   Patient denies nausea, vomiting, and indigestion/ heartburn.  Gastrointestinal (Lower):   Patient denies diarrhea and constipation.  Constitutional:   Patient denies fever, night sweats, weight loss, and fatigue.  Skin:   Patient denies skin rash/ lesion and itching.  Eyes:   Patient denies blurred vision and double vision.  Ears/ Nose/ Throat:   Patient denies sinus problems and sore throat.  Hematologic/Lymphatic:   Patient denies swollen glands and easy bruising.  Cardiovascular:   Patient denies leg  swelling and chest pains.  Respiratory:   Patient denies cough and shortness of breath.  Endocrine:   Patient denies excessive thirst.  Musculoskeletal:   Patient denies back pain and joint pain.  Neurological:   Patient denies headaches and dizziness.  Psychologic:   Patient denies depression and anxiety.   VITAL SIGNS: None   MULTI-SYSTEM PHYSICAL EXAMINATION:    Constitutional: Well-nourished. No physical deformities. Normally developed. Good grooming.  Neck: Neck symmetrical, not swollen. Normal tracheal position.  Respiratory: No labored breathing, no use of accessory muscles.   Skin: No paleness, no jaundice, no cyanosis. No lesion, no ulcer, no rash.  Neurologic / Psychiatric: Oriented to time, oriented to place, oriented to person. No depression, no anxiety, no agitation.  Eyes: Normal conjunctivae. Normal eyelids.  Ears, Nose, Mouth, and Throat: Left ear no scars, no lesions, no masses. Right ear no scars, no lesions, no masses. Nose no scars, no lesions, no masses. Normal hearing. Normal lips.  Musculoskeletal: Normal gait and station of head and neck.     Complexity of Data:  Records Review:   Previous Patient Records, POC Tool  Urine Test Review:   Urinalysis  Urodynamics Review:   Review Urodynamics Tests   PROCEDURES:          Visit Complexity - G2211          Urinalysis Dipstick Dipstick Cont'd  Color: Yellow Bilirubin: Neg mg/dL  Appearance: Clear Ketones: 2+ mg/dL  Specific Gravity: 7.829 Blood: Neg ery/uL  pH: <=5.0 Protein: Neg mg/dL  Glucose: Neg mg/dL Urobilinogen: 0.2 mg/dL    Nitrites: Neg    Leukocyte Esterase: Neg leu/uL    ASSESSMENT:      ICD-10 Details  1 GU:   Overactive bladder - N32.81 Chronic, Stable  2   Urge incontinence - N39.41 Chronic, Stable  3   Urinary Frequency - R35.0 Chronic, Stable   PLAN:           Document Letter(s):  Created for Patient: Clinical Summary         Notes:   1. Overactive bladder/urge incontinence:   -Risks and benefits of cystoscopy with Botox injections discussed with the patient in detail including but not limited to pain, bleeding, infection, urinary retention requiring CIC  -We discussed doing this in the office with nitrous versus in the operating room. She would like to move forward with sedation. She does lose insurance the end of March and would like to schedule this ASAP.

## 2023-04-30 ENCOUNTER — Ambulatory Visit (HOSPITAL_COMMUNITY): Payer: Self-pay | Admitting: Medical

## 2023-04-30 ENCOUNTER — Ambulatory Visit (HOSPITAL_COMMUNITY): Payer: Self-pay | Admitting: Anesthesiology

## 2023-04-30 ENCOUNTER — Ambulatory Visit (HOSPITAL_COMMUNITY)
Admission: RE | Admit: 2023-04-30 | Discharge: 2023-04-30 | Disposition: A | Discharge status: Home / Self Care | Payer: Self-pay | Attending: Urology | Admitting: Urology

## 2023-04-30 ENCOUNTER — Encounter (HOSPITAL_COMMUNITY): Payer: Self-pay | Admitting: Urology

## 2023-04-30 ENCOUNTER — Encounter (HOSPITAL_COMMUNITY)
Admission: RE | Disposition: A | Discharge status: Home / Self Care | Payer: Self-pay | Source: Home / Self Care | Attending: Urology

## 2023-04-30 DIAGNOSIS — K219 Gastro-esophageal reflux disease without esophagitis: Secondary | ICD-10-CM | POA: Diagnosis not present

## 2023-04-30 DIAGNOSIS — M1990 Unspecified osteoarthritis, unspecified site: Secondary | ICD-10-CM | POA: Insufficient documentation

## 2023-04-30 DIAGNOSIS — N3281 Overactive bladder: Secondary | ICD-10-CM | POA: Insufficient documentation

## 2023-04-30 DIAGNOSIS — N3941 Urge incontinence: Secondary | ICD-10-CM | POA: Diagnosis not present

## 2023-04-30 DIAGNOSIS — Z01818 Encounter for other preprocedural examination: Secondary | ICD-10-CM

## 2023-04-30 DIAGNOSIS — Z7984 Long term (current) use of oral hypoglycemic drugs: Secondary | ICD-10-CM | POA: Insufficient documentation

## 2023-04-30 DIAGNOSIS — I10 Essential (primary) hypertension: Secondary | ICD-10-CM | POA: Insufficient documentation

## 2023-04-30 DIAGNOSIS — Z87891 Personal history of nicotine dependence: Secondary | ICD-10-CM | POA: Diagnosis not present

## 2023-04-30 DIAGNOSIS — Z8249 Family history of ischemic heart disease and other diseases of the circulatory system: Secondary | ICD-10-CM | POA: Insufficient documentation

## 2023-04-30 DIAGNOSIS — Z833 Family history of diabetes mellitus: Secondary | ICD-10-CM | POA: Diagnosis not present

## 2023-04-30 DIAGNOSIS — E119 Type 2 diabetes mellitus without complications: Secondary | ICD-10-CM | POA: Insufficient documentation

## 2023-04-30 DIAGNOSIS — R42 Dizziness and giddiness: Secondary | ICD-10-CM | POA: Diagnosis not present

## 2023-04-30 HISTORY — PX: BOTOX INJECTION: SHX5754

## 2023-04-30 HISTORY — PX: CYSTOSCOPY: SHX5120

## 2023-04-30 LAB — CBC
HCT: 41.5 % (ref 36.0–46.0)
Hemoglobin: 13.4 g/dL (ref 12.0–15.0)
MCH: 29.1 pg (ref 26.0–34.0)
MCHC: 32.3 g/dL (ref 30.0–36.0)
MCV: 90 fL (ref 80.0–100.0)
Platelets: 332 10*3/uL (ref 150–400)
RBC: 4.61 MIL/uL (ref 3.87–5.11)
RDW: 12.9 % (ref 11.5–15.5)
WBC: 6.6 10*3/uL (ref 4.0–10.5)
nRBC: 0 % (ref 0.0–0.2)

## 2023-04-30 LAB — BASIC METABOLIC PANEL
Anion gap: 8 (ref 5–15)
BUN: 21 mg/dL (ref 8–23)
CO2: 25 mmol/L (ref 22–32)
Calcium: 9.1 mg/dL (ref 8.9–10.3)
Chloride: 109 mmol/L (ref 98–111)
Creatinine, Ser: 0.72 mg/dL (ref 0.44–1.00)
GFR, Estimated: >60 mL/min (ref >60)
Glucose, Bld: 108 mg/dL — ABNORMAL HIGH (ref 70–99)
Potassium: 3.8 mmol/L (ref 3.5–5.1)
Sodium: 142 mmol/L (ref 135–145)

## 2023-04-30 SURGERY — BOTOX INJECTION
Anesthesia: General | Site: Bladder

## 2023-04-30 MED ORDER — DEXAMETHASONE SODIUM PHOSPHATE 10 MG/ML IJ SOLN
INTRAMUSCULAR | Status: DC | PRN
  Administered 2023-04-30: 4 mg via INTRAVENOUS

## 2023-04-30 MED ORDER — ONDANSETRON HCL 4 MG/2ML IJ SOLN
INTRAMUSCULAR | Status: AC
  Filled 2023-04-30: qty 2

## 2023-04-30 MED ORDER — LIDOCAINE HCL 1 % IJ SOLN
INTRAMUSCULAR | Status: DC | PRN
  Administered 2023-04-30: 50 mg via INTRADERMAL

## 2023-04-30 MED ORDER — FENTANYL CITRATE PF 50 MCG/ML IJ SOSY: 25.0000 ug | PREFILLED_SYRINGE | INTRAMUSCULAR | Status: DC | PRN

## 2023-04-30 MED ORDER — PROPOFOL 10 MG/ML IV BOLUS
INTRAVENOUS | Status: AC
  Filled 2023-04-30: qty 20

## 2023-04-30 MED ORDER — CHLORHEXIDINE GLUCONATE 0.12 % MT SOLN
15.0000 mL | Freq: Once | OROMUCOSAL | Status: AC
  Administered 2023-04-30: 15 mL via OROMUCOSAL

## 2023-04-30 MED ORDER — ONDANSETRON HCL 4 MG/2ML IJ SOLN
INTRAMUSCULAR | Status: DC | PRN
  Administered 2023-04-30: 4 mg via INTRAVENOUS

## 2023-04-30 MED ORDER — ONABOTULINUMTOXINA 100 UNITS IJ SOLR
INTRAMUSCULAR | Status: DC | PRN
  Administered 2023-04-30: 100 [IU] via INTRAMUSCULAR

## 2023-04-30 MED ORDER — PROPOFOL 500 MG/50ML IV EMUL
INTRAVENOUS | Status: DC | PRN
Start: 2023-04-30 — End: 2023-04-30
  Administered 2023-04-30: 120 ug/kg/min via INTRAVENOUS

## 2023-04-30 MED ORDER — SODIUM CHLORIDE 0.9 % IR SOLN
Status: DC | PRN
  Administered 2023-04-30: 3000 mL

## 2023-04-30 MED ORDER — SODIUM CHLORIDE (PF) 0.9 % IJ SOLN
INTRAMUSCULAR | Status: AC
  Filled 2023-04-30: qty 10

## 2023-04-30 MED ORDER — PROPOFOL 10 MG/ML IV BOLUS: INTRAVENOUS | Status: DC | PRN

## 2023-04-30 MED ORDER — CEFAZOLIN SODIUM-DEXTROSE 2-4 GM/100ML-% IV SOLN
2.0000 g | Freq: Once | INTRAVENOUS | Status: AC
  Administered 2023-04-30: 2 g via INTRAVENOUS
  Filled 2023-04-30: qty 100

## 2023-04-30 MED ORDER — ORAL CARE MOUTH RINSE: 15.0000 mL | Freq: Once | OROMUCOSAL | Status: AC

## 2023-04-30 MED ORDER — FENTANYL CITRATE (PF) 100 MCG/2ML IJ SOLN
INTRAMUSCULAR | Status: AC
  Filled 2023-04-30: qty 2

## 2023-04-30 MED ORDER — FENTANYL CITRATE (PF) 100 MCG/2ML IJ SOLN
INTRAMUSCULAR | Status: DC | PRN
  Administered 2023-04-30: 50 ug via INTRAVENOUS
  Administered 2023-04-30 (×2): 25 ug via INTRAVENOUS

## 2023-04-30 MED ORDER — MIDAZOLAM HCL 2 MG/2ML IJ SOLN
INTRAMUSCULAR | Status: AC
  Filled 2023-04-30: qty 2

## 2023-04-30 MED ORDER — ONABOTULINUMTOXINA 100 UNITS IJ SOLR
INTRAMUSCULAR | Status: AC
  Filled 2023-04-30: qty 100

## 2023-04-30 MED ORDER — ACETAMINOPHEN 500 MG PO TABS
1000.0000 mg | ORAL_TABLET | Freq: Once | ORAL | Status: AC
  Administered 2023-04-30: 1000 mg via ORAL
  Filled 2023-04-30: qty 2

## 2023-04-30 MED ORDER — SODIUM CHLORIDE (PF) 0.9 % IJ SOLN
INTRAMUSCULAR | Status: DC | PRN
  Administered 2023-04-30: 10 mL

## 2023-04-30 MED ORDER — MIDAZOLAM HCL 5 MG/5ML IJ SOLN
INTRAMUSCULAR | Status: DC | PRN
  Administered 2023-04-30 (×2): 1 mg via INTRAVENOUS

## 2023-04-30 MED ORDER — LACTATED RINGERS IV SOLN: INTRAVENOUS | Status: DC

## 2023-04-30 MED ORDER — PHENYLEPHRINE HCL (PRESSORS) 10 MG/ML IV SOLN
INTRAVENOUS | Status: DC | PRN
  Administered 2023-04-30 (×2): 80 ug via INTRAVENOUS

## 2023-04-30 SURGICAL SUPPLY — 18 items
BAG URO CATCHER STRL LF (MISCELLANEOUS) ×2 IMPLANT
CATH URETL OPEN END 6FR 70 (CATHETERS) ×2 IMPLANT
CLOTH BEACON ORANGE TIMEOUT ST (SAFETY) ×2 IMPLANT
ELECT REM PT RETURN 15FT ADLT (MISCELLANEOUS) ×2 IMPLANT
GLOVE BIO SURGEON STRL SZ 6.5 (GLOVE) ×2 IMPLANT
GOWN STRL REUS W/ TWL LRG LVL3 (GOWN DISPOSABLE) ×2 IMPLANT
GUIDEWIRE STR DUAL SENSOR (WIRE) ×2 IMPLANT
KIT TURNOVER KIT A (KITS) ×2 IMPLANT
MANIFOLD NEPTUNE II (INSTRUMENTS) ×2 IMPLANT
NDL ASPIRATION 22 (NEEDLE) ×2 IMPLANT
NDL SAFETY ECLIPSE 18X1.5 (NEEDLE) ×2 IMPLANT
NEEDLE ASPIRATION 22 (NEEDLE) ×1 IMPLANT
PACK CYSTO (CUSTOM PROCEDURE TRAY) ×2 IMPLANT
SYR 20ML LL LF (SYRINGE) ×2 IMPLANT
SYR CONTROL 10ML LL (SYRINGE) ×2 IMPLANT
TUBING CONNECTING 10 (TUBING) ×2 IMPLANT
TUBING UROLOGY SET (TUBING) ×2 IMPLANT
WATER STERILE IRR 3000ML UROMA (IV SOLUTION) ×2 IMPLANT

## 2023-04-30 NOTE — Transfer of Care (Signed)
 Immediate Anesthesia Transfer of Care Note  Patient: Tracey Rodriguez  Procedure(s) Performed: BOTOX INJECTION (Bladder) CYSTOSCOPY (Bladder)  Patient Location: PACU  Anesthesia Type:MAC  Level of Consciousness: awake, alert , oriented, and patient cooperative  Airway & Oxygen Therapy: Patient Spontanous Breathing and Patient connected to face mask oxygen  Post-op Assessment: Report given to RN and Post -op Vital signs reviewed and stable  Post vital signs: Reviewed and stable  Last Vitals:  Vitals Value Taken Time  BP 122/66 04/30/23 1124  Temp    Pulse 63 04/30/23 1127  Resp 18 04/30/23 1127  SpO2 100 % 04/30/23 1127  Vitals shown include unfiled device data.  Last Pain:  Vitals:   04/30/23 1013  TempSrc:   PainSc: 5          Complications: No notable events documented.

## 2023-04-30 NOTE — Interval H&P Note (Signed)
 History and Physical Interval Note:  04/30/2023 10:08 AM  Tracey Rodriguez  has presented today for surgery, with the diagnosis of URGE INCONTINENCE.  The various methods of treatment have been discussed with the patient and family. After consideration of risks, benefits and other options for treatment, the patient has consented to  Procedure(s) with comments: BOTOX INJECTION (N/A) - 30 MINUTES NEEDED CYSTOSCOPY (N/A) as a surgical intervention.  The patient's history has been reviewed, patient examined, no change in status, stable for surgery.  I have reviewed the patient's chart and labs.  Questions were answered to the patient's satisfaction.     [name] D Ivionna Verley

## 2023-04-30 NOTE — Op Note (Signed)
 Operative Note  Preoperative diagnosis:  1.  Overactive bladder with urge incontinence  Postoperative diagnosis: 1.  Overactive bladder with urge incontinence  Procedure(s): 1.  Cystoscopy with 100 units of intravesical Botox  Surgeon: Kasandra Knudsen, MD  Assistants:  None  Anesthesia:  MAC  Complications:  None  EBL: None  Specimens: 1.  None  Drains/Catheters: 1.  None  Intraoperative findings:   Normal urethra Bilateral orthotopic ureteral orifices Normal bladder mucosa without masses  Indication:  Tracey Rodriguez is a 63 y.o. female with longstanding urinary urgency, frequency and urge incontinence refractory to medication.  Description of procedure:  After risks and benefits of the procedure discussed with the patient, informed consent was obtained.  The patient taken to the operating placed in supine position.  Anesthesia induced antibiotics administered.  She was then repositioned to the dorsolithotomy position.  She was prepped and draped in the usual sterile fashion and timeout is performed.  In the injection cystoscope was placed in the urethral meatus and advanced into the bladder under direct visualization.  100 units of Botox mixed in 10 cc of injectable saline were then injected in a standard template over the posterior wall.  Care was taken to avoid the ureteral orifices.  The patient's bladder was decompressed and the cystoscope was removed.  The patient emerged from anesthesia and transferred back in stable condition.  Plan: Discharge home.  She was given instructions to stop Gemtesa in approximately 1 week.

## 2023-04-30 NOTE — Anesthesia Preprocedure Evaluation (Addendum)
 Anesthesia Evaluation  Patient identified by MRN, date of birth, ID band Patient awake    Reviewed: Allergy & Precautions, H&P , NPO status , Patient's Chart, lab work & pertinent test results, reviewed documented beta blocker date and time   History of Anesthesia Complications (+) PONV and history of anesthetic complications  Airway Mallampati: II  TM Distance: >3 FB Neck ROM: Full    Dental no notable dental hx. (+) Teeth Intact, Dental Advisory Given   Pulmonary asthma , former smoker   Pulmonary exam normal breath sounds clear to auscultation       Cardiovascular hypertension, Pt. on medications and Pt. on home beta blockers  Rhythm:Regular Rate:Normal     Neuro/Psych negative neurological ROS  negative psych ROS   GI/Hepatic Neg liver ROS,GERD  Medicated,,  Endo/Other  diabetes, Type 2, Oral Hypoglycemic AgentsHypothyroidism    Renal/GU negative Renal ROS  negative genitourinary   Musculoskeletal  (+) Arthritis , Osteoarthritis,  Fibromyalgia -  Abdominal   Peds  Hematology negative hematology ROS (+)   Anesthesia Other Findings   Reproductive/Obstetrics negative OB ROS                             Anesthesia Physical Anesthesia Plan  ASA: 2  Anesthesia Plan: MAC   Post-op Pain Management: Tylenol PO (pre-op)*   Induction: Intravenous  PONV Risk Score and Plan: 3 and Ondansetron, Propofol infusion and Dexamethasone  Airway Management Planned: Natural Airway and Simple Face Mask  Additional Equipment:   Intra-op Plan:   Post-operative Plan:   Informed Consent: I have reviewed the patients History and Physical, chart, labs and discussed the procedure including the risks, benefits and alternatives for the proposed anesthesia with the patient or authorized representative who has indicated his/her understanding and acceptance.     Dental advisory given  Plan Discussed  with: CRNA  Anesthesia Plan Comments:        Anesthesia Quick Evaluation

## 2023-05-01 ENCOUNTER — Encounter (HOSPITAL_COMMUNITY): Payer: Self-pay | Admitting: Urology

## 2023-05-01 NOTE — Anesthesia Postprocedure Evaluation (Signed)
 Anesthesia Post Note  Patient: AMAYIA CIANO  Procedure(s) Performed: BOTOX INJECTION (Bladder) CYSTOSCOPY (Bladder)     Patient location during evaluation: PACU Anesthesia Type: General Level of consciousness: awake and alert Pain management: pain level controlled Vital Signs Assessment: post-procedure vital signs reviewed and stable Respiratory status: spontaneous breathing, nonlabored ventilation and respiratory function stable Cardiovascular status: stable and blood pressure returned to baseline Postop Assessment: no apparent nausea or vomiting Anesthetic complications: no   No notable events documented.  Last Vitals:  Vitals:   04/30/23 1200 04/30/23 1215  BP: (!) 135/59 (!) 154/95  Pulse: 63 70  Resp: 18 16  Temp:  (!) 36.4 C  SpO2: 100% 100%    Last Pain:  Vitals:   04/30/23 1215  TempSrc:   PainSc: 0-No pain                 Avonda Toso,W. EDMOND

## 2023-05-04 ENCOUNTER — Other Ambulatory Visit: Payer: Self-pay | Admitting: Physician Assistant

## 2023-05-04 MED ORDER — METHOCARBAMOL 500 MG PO TABS: ORAL_TABLET | ORAL | 2 refills | Status: DC

## 2023-05-04 NOTE — Addendum Note (Signed)
 Addended by: Ellen Henri on: 05/04/2023 02:58 PM   Modules accepted: Orders

## 2023-05-04 NOTE — Telephone Encounter (Signed)
 Last Fill: 08/14/2021  Next Visit: 09/29/2023  Last Visit: 04/01/2023  Dx: DDD (degenerative disc disease), cervical and Fibromyalgia   Current Dose per office note on 04/01/2023: methocarbamol 500 mg 1 tablet daily as needed for muscle spasms   Okay to refill Methocarbamol?

## 2023-05-04 NOTE — Telephone Encounter (Signed)
 Resent rx since original failed in transmission to pharmacy.

## 2023-05-07 ENCOUNTER — Encounter: Payer: Self-pay | Admitting: Physical Medicine & Rehabilitation

## 2023-05-07 ENCOUNTER — Encounter
Payer: BC Managed Care – PPO | Attending: Physical Medicine & Rehabilitation | Admitting: Physical Medicine & Rehabilitation

## 2023-05-07 VITALS — BP 143/81 | HR 81 | Ht 66.0 in | Wt 189.0 lb

## 2023-05-07 DIAGNOSIS — M797 Fibromyalgia: Secondary | ICD-10-CM

## 2023-05-07 MED ORDER — DULOXETINE HCL 20 MG PO CPEP
20.0000 mg | ORAL_CAPSULE | Freq: Every day | ORAL | 1 refills | Status: DC
Start: 2023-05-07 — End: 2023-07-02

## 2023-05-07 NOTE — Progress Notes (Signed)
 Hanus) doing okay  Subjective:    Patient ID: Tracey Rodriguez, female    DOB: Apr 11, 1960, 63 y.o.   MRN: [id number] 63 yo female with hx of fibromyalgia syndrome and sacroiliac disorder Seen today with primary complaints of back pain neck pain and hip pain Last SI nerve block performed on 11/28/22 which gave short term 100% pain relief  Prior SI nerve block was 07/18/22 and lasted 36mo Patient remains modified independent with all self-care and mobility. She has not suffered any recent falls.  HPI 63 year old female with history of fibromyalgia syndrome, osteoarthritis of the lumbar spine as well as both hands, degenerative disc disease of the cervical spine as well as sacroiliac disorder who is here today with primary complaint of chronic low back pain. Dizziness- evaluated by ENT and Neuro , has MRI evidence of 5mm lesion that is lateral to the Right lateral rectus Patient states that her overall pain symptoms have improved with the use of Lyrica however she is having side effects from medication including mental fogginess.  She is asking if there is any other medications that may be helpful which cause less drowsiness.  We discussed alternative treatments she has tried gabapentin already and was not as helpful as the Lyrica.  We discussed that duloxetine may be a good option for her.  I reviewed her allergies and does not appear to have any issues with this class of medication. Pain Inventory Average Pain 8 Pain Right Now 7 My pain is sharp and aching  In the last 24 hours, has pain interfered with the following? General activity 9 Relation with others 0 Enjoyment of life 0 What TIME of day is your pain at its worst? morning , daytime, evening, and night Sleep (in general) Poor  Pain is worse with: unsure and it is just there Pain improves with: heat/ice and medication Relief from Meds: 8  Family History  Problem Relation Age of Onset   Breast cancer Mother 57   Ovarian cancer  Mother    Heart disease Father 2   Heart attack Father    Clotting disorder Father    Rheumatologic disease Father    Crohn's disease Father    Esophageal cancer Brother    Heart attack Brother    Cancer Brother    Lung cancer Brother    Colon polyps Brother    Throat cancer Brother    Colon polyps Brother    Stroke Brother    Breast cancer Maternal Aunt 60   Esophageal cancer Maternal Uncle    Stroke Paternal Aunt    Heart disease Paternal Aunt    Kidney disease Paternal Uncle    Liver cancer Maternal Grandmother    Heart disease Maternal Grandfather    Lung cancer Maternal Grandfather    Irritable bowel syndrome Son    Colon cancer Neg Hx    Rectal cancer Neg Hx    Stomach cancer Neg Hx    Social History   Socioeconomic History   Marital status: Legally Separated    Spouse name: Not on file   Number of children: 2   Years of education: Not on file   Highest education level: Not on file  Occupational History   Not on file  Tobacco Use   Smoking status: Former    Current packs/day: 0.00    Average packs/day: 3.0 packs/day for 22.0 years (66.0 ttl pk-yrs)    Types: Cigarettes    Start date: 02/17/1978    Quit date: 02/18/2000  Years since quitting: 23.2    Passive exposure: Never   Smokeless tobacco: Never  Vaping Use   Vaping status: Never Used  Substance and Sexual Activity   Alcohol use: Yes    Alcohol/week: 0.0 standard drinks of alcohol    Comment: RARELY   Drug use: Never   Sexual activity: Yes    Birth control/protection: Post-menopausal  Other Topics Concern   Not on file  Social History Narrative   She is married, has children.  They are grown.  1 son and 1 daughter has at least 1 grandchild   Former smoker no significant alcohol no drug use   Social Drivers of Corporate investment banker Strain: Not on file  Food Insecurity: No Food Insecurity (03/05/2021)   Received from [facility]   Hunger Vital Sign    Worried About  Running Out of Food in the Last Year: Never true    Ran Out of Food in the Last Year: Never true  Transportation Needs: Not on file  Physical Activity: Not on file  Stress: Not on file  Social Connections: Unknown (06/25/2021)   Received from [facility]   Social Network    Social Network: Not on file   Past Surgical History:  Procedure Laterality Date   ABLATION     ANTERIOR (CYSTOCELE) AND POSTERIOR REPAIR (RECTOCELE) WITH XENFORM GRAFT AND SACROSPINOUS FIXATION  09/10/2020   APPENDECTOMY     BILATERAL SALPINGOOPHORECTOMY  09/10/2020   BOTOX INJECTION N/A 04/30/2023   Procedure: BOTOX INJECTION;  Surgeon: Noel Christmas, MD;  Location: [facility];  Service: Urology;  Laterality: N/A;  30 MINUTES NEEDED   BREAST EXCISIONAL BIOPSY Left    BREAST SURGERY     left- lumpectomy   CYSTOSCOPY N/A 04/30/2023   Procedure: CYSTOSCOPY;  Surgeon: Noel Christmas, MD;  Location: [facility];  Service: Urology;  Laterality: N/A;   DILATATION & CURRETTAGE/HYSTEROSCOPY WITH RESECTOCOPE N/A 04/05/2012   Procedure: hysteroscopy with endocervical curretting;  Surgeon: Philip Aspen, DO;  Location: [facility];  Service: Gynecology;  Laterality: N/A;   DILATION AND CURETTAGE OF UTERUS     EYE SURGERY Bilateral 10/2017   cataract extraction    LIVER BIOPSY  09/2018   NOSE SURGERY     VAGINAL HYSTERECTOMY  09/10/2020   Past Surgical History:  Procedure Laterality Date   ABLATION     ANTERIOR (CYSTOCELE) AND POSTERIOR REPAIR (RECTOCELE) WITH XENFORM GRAFT AND SACROSPINOUS FIXATION  09/10/2020   APPENDECTOMY     BILATERAL SALPINGOOPHORECTOMY  09/10/2020   BOTOX INJECTION N/A 04/30/2023   Procedure: BOTOX INJECTION;  Surgeon: Noel Christmas, MD;  Location: [facility];  Service: Urology;  Laterality: N/A;  30 MINUTES NEEDED   BREAST EXCISIONAL BIOPSY Left    BREAST SURGERY     left- lumpectomy   CYSTOSCOPY N/A 04/30/2023   Procedure: CYSTOSCOPY;  Surgeon: Noel Christmas, MD;  Location: [facility];  Service: Urology;  Laterality: N/A;   DILATATION & CURRETTAGE/HYSTEROSCOPY WITH RESECTOCOPE N/A 04/05/2012   Procedure: hysteroscopy with endocervical curretting;  Surgeon: Philip Aspen, DO;  Location: [facility];  Service: Gynecology;  Laterality: N/A;   DILATION AND CURETTAGE OF UTERUS     EYE SURGERY Bilateral 10/2017   cataract extraction    LIVER BIOPSY  09/2018   NOSE SURGERY     VAGINAL HYSTERECTOMY  09/10/2020   Past Medical History:  Diagnosis Date   Arthritis    deg disc disease  -  lower back, neck   Asthma    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, behind right eye   Cancer (HCC)    left breast surgery-lumpectomy   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 (HCC)    Fibromyalgia    GERD (gastroesophageal reflux disease)    Heart murmur    Hx of adenomatous polyp of colon 02/04/2018   Hyperlipidemia    Hypertension    Hypothyroidism    goiter   NAFLD (nonalcoholic fatty liver disease)    Neuromuscular disorder (HCC)    sciatic nerve   PONV (postoperative nausea and vomiting)    Pre-diabetes    Rectocele 01/27/2018   SVD (spontaneous vaginal delivery)    x 2   BP (!) 143/81   Pulse 81   Ht 5\' 6"  (1.676 m)   Wt 189 lb (85.7 kg)   SpO2 97%   BMI 30.51 kg/m   Opioid Risk Score:   Fall Risk Score:  `1  Depression screen PHQ 2/9     12/16/2022    2:02 PM 11/28/2022    1:39 PM 08/29/2022    1:54 PM 07/03/2022   10:32 AM 06/13/2022    9:39 AM 10/24/2021    9:29 AM 09/05/2021   11:44 AM  Depression screen PHQ 2/9  Decreased Interest 0 0 0 0 0 0 0  Down, Depressed, Hopeless 0 0 0 0 0 0 0  PHQ - 2 Score 0 0 0 0 0 0 0      Review of Systems  Musculoskeletal:  Positive for back pain and neck pain.       Bilateral shoulder pain, right is the worse  All other systems reviewed and are negative.      Objective:   Physical Exam Constitutional:      Appearance: She is obese.  HENT:     Head: Normocephalic and atraumatic.  Eyes:     Extraocular  Movements: Extraocular movements intact.     Conjunctiva/sclera: Conjunctivae normal.     Pupils: Pupils are equal, round, and reactive to light.  Musculoskeletal:        General: No swelling or tenderness.     Right lower leg: No edema.     Left lower leg: No edema.     Comments: There is tenderness palpation lumbar region near the sacral junction.) left side. Lumbar range of motion is 50% flexion extension lateral rotation and bending There is no tenderness along the thoracic or cervical paraspinal areas no tenderness over the upper traps.  Sacral thrust (prone) : Positive Lateral compression: Negative FABER's: Negative Distraction (supine): Negative Thigh thrust test: Negative  Skin:    General: Skin is warm and dry.  Neurological:     Mental Status: She is alert and oriented to person, place, and time.  Psychiatric:        Mood and Affect: Mood normal.        Behavior: Behavior normal.    Negative straight leg raising bilaterally Motor strength is 5/5 bilateral hip flexor knee extensor ankle dorsiflexor        Assessment & Plan:   #1.  Fibromyalgia syndrome amplifying her underlying osteoarthritis of multiple areas including low back and neck. She is having difficulty tolerating the pregabalin/Lyrica due to drowsiness. We discussed other treatment options.  She has not tried duloxetine.  She is not on SSRIs at the current time.  She has no allergies to this drug class. We discussed that the  onset of action may be at least a couple weeks.  That we can increase dosing after 8 weeks.  Will start at 20 mg/day.  She can  stop her Lyrica or if it helps with sleep continue with just at night.  I will see her back in 3 months Have encouraged physical activity

## 2023-05-12 NOTE — Progress Notes (Signed)
 Cardiology Office Note:    Date:  05/13/2023  ID:  Tracey Rodriguez, DOB [birth year], MRN [id number] PCP: Henrine Screws, MD  [facility] Providers Cardiologist:  Lance Muss, MD       Patient Profile:      Hyperlipidemia  Intol of statins FHx of CAD TTE 06/15/15: EF 65-70, no RWMA, NL RVSF CCTA 09/16/18: CAC score 0, no CAD  Carpal tunnel syndrome Hypothyroidism  Asthma Breast CA Factor V deficiency         Discussed the use of AI scribe software for clinical note transcription with the patient, who gave verbal consent to proceed.  History of Present Illness Tracey Rodriguez is a 63 y.o. female. She was last seen by Dr. Eldridge Dace 08/14/22.   She was asked to follow-up with cardiology by her neurologist for evaluation of dizziness. She has been experiencing left arm discomfort associated with stress for approximately eight months to a year. There is no chest pain, but she experiences shortness of breath with exertion, such as walking fast or climbing stairs. She uses an inhaler before physical activity and sleeps with her head elevated on two pillows due to discomfort when lying flat. She has a history of smoking for 27 years, quitting in 2002. She reports episodes of dizziness since May 2023, characterized by a spinning sensation, vomiting, and difficulty walking. These episodes initially lasted about two weeks, with subsequent episodes occurring in the fall of 2023 and August 2024, the latter lasting three months. The dizziness often begins when she approaches her bed at night but is not triggered by head movements. She has used dramamine and seasickness patches for symptom relief. No significant palpitations or near syncope. She has a significant family history of heart disease, with her brother experiencing heart attacks in his thirties, her father having serious heart issues, and another brother having strokes and an amputation.   ROS-See HPI    Studies  Reviewed:   EKG Interpretation Date/Time:  Wednesday May 13 2023 10:22:25 EDT Ventricular Rate:  79 PR Interval:  154 QRS Duration:  86 QT Interval:  404 QTC Calculation: 463 R Axis:   59  Text Interpretation: Normal sinus rhythm Septal infarct (cited on or before 27-Mar-2013) No significant change since last tracing Confirmed by Tereso Newcomer [phone]) on 05/13/2023 10:26:18 AM    Results LABS Cholesterol: Total cholesterol 271 mg/dL, Triglycerides 621 mg/dL, HDL 63 mg/dL, LDL 308 mg/dL (65/78/4696)  RADIOLOGY Per neurology notes-carotid ultrasound: No stenosis in internal carotid arteries (10/08/2022) Per neurology notes-brain MRI: No acute findings (02/03/2023)    Risk Assessment/Calculations:             Physical Exam:   VS:  BP 120/70   Pulse 74   Ht 5\' 5"  (1.651 m)   Wt 184 lb 6.4 oz (83.6 kg)   SpO2 97%   BMI 30.69 kg/m    Wt Readings from Last 3 Encounters:  05/13/23 184 lb 6.4 oz (83.6 kg)  05/07/23 189 lb (85.7 kg)  04/30/23 186 lb (84.4 kg)    Constitutional:      Appearance: Healthy appearance. Not in distress.  Neck:     Vascular: JVD normal.  Pulmonary:     Breath sounds: Normal breath sounds. No wheezing. No rales.  Cardiovascular:     Normal rate. Regular rhythm.     Murmurs: There is no murmur.  Edema:    Peripheral edema absent.  Abdominal:     Palpations: Abdomen is soft.  Assessment and Plan:   Assessment & Plan Chest pain She notes intermittent left arm discomfort associated with emotional and physical stress.  She has a strong family history of coronary artery disease. No acute changes on EKG today.Her symptoms are possibly cardiac.   - Arrange coronary CT angiography to assess for obstructive coronary artery disease.  Hyperlipidemia 10-year ASCVD risk 5.5%.  Significantly elevated LDL at 181 mg/dL with a strong family history of coronary artery disease. Intolerant to statins and hesitant about PCSK9 inhibitors.  She would like  to try ezetimibe first. - Start Zetia 10 mg daily. - Obtain CMP and lipid panel in three months.  Dizziness Symptoms seem consistent with vertigo. Symptoms do not suggest near syncope or significant palpitations. Neurologist recommended cardiology follow-up to rule out cardiac causes.  - Obtain 2D echocardiogram. - Consider event monitor at follow-up if needed.        Dispo:  Return in about 3 months (around 08/13/2023) for Routine Follow Up, w/ Tereso Newcomer, PA-C.  Signed, Tereso Newcomer, PA-C

## 2023-05-13 ENCOUNTER — Ambulatory Visit: Attending: Physician Assistant | Admitting: Physician Assistant

## 2023-05-13 ENCOUNTER — Encounter: Payer: Self-pay | Admitting: Physician Assistant

## 2023-05-13 VITALS — BP 120/70 | HR 74 | Ht 65.0 in | Wt 184.4 lb

## 2023-05-13 DIAGNOSIS — R42 Dizziness and giddiness: Secondary | ICD-10-CM | POA: Diagnosis not present

## 2023-05-13 DIAGNOSIS — E7800 Pure hypercholesterolemia, unspecified: Secondary | ICD-10-CM | POA: Diagnosis not present

## 2023-05-13 DIAGNOSIS — R072 Precordial pain: Secondary | ICD-10-CM

## 2023-05-13 DIAGNOSIS — M546 Pain in thoracic spine: Secondary | ICD-10-CM | POA: Diagnosis not present

## 2023-05-13 DIAGNOSIS — M542 Cervicalgia: Secondary | ICD-10-CM | POA: Diagnosis not present

## 2023-05-13 DIAGNOSIS — M25511 Pain in right shoulder: Secondary | ICD-10-CM | POA: Diagnosis not present

## 2023-05-13 DIAGNOSIS — M5459 Other low back pain: Secondary | ICD-10-CM | POA: Diagnosis not present

## 2023-05-13 MED ORDER — METOPROLOL TARTRATE 50 MG PO TABS: ORAL_TABLET | ORAL | 0 refills | Status: AC

## 2023-05-13 MED ORDER — EZETIMIBE 10 MG PO TABS: 10.0000 mg | ORAL_TABLET | Freq: Every day | ORAL | 3 refills | Status: AC

## 2023-05-13 NOTE — Patient Instructions (Signed)
 Medication Instructions:  Your physician has recommended you make the following change in your medication:   START Zetia 10 mg taking 1 daily  *If you need a refill on your cardiac medications before your next appointment, please call your pharmacy*   Lab Work: None today, come to your next appointment in 3 months fasting and we will get labs at that time.  If you have labs (blood work) drawn today and your tests are completely normal, you will receive your results only by: MyChart Message (if you have MyChart) OR A paper copy in the mail If you have any lab test that is abnormal or we need to change your treatment, we will call you to review the results.   Testing/Procedures: Your physician has requested that you have an echocardiogram. Echocardiography is a painless test that uses sound waves to create images of your heart. It provides your doctor with information about the size and shape of your heart and how well your heart's chambers and valves are working. This procedure takes approximately one hour. There are no restrictions for this procedure. Please do NOT wear cologne, perfume, aftershave, or lotions (deodorant is allowed). Please arrive 15 minutes prior to your appointment time.  Please note: We ask at that you not bring children with you during ultrasound (echo/ vascular) testing. Due to room size and safety concerns, children are not allowed in the ultrasound rooms during exams. Our front office staff cannot provide observation of children in our lobby area while testing is being conducted. An adult accompanying a patient to their appointment will only be allowed in the ultrasound room at the discretion of the ultrasound technician under special circumstances. We apologize for any inconvenience.   Your physician has requested that you have cardiac CT. Cardiac computed tomography (CT) is a painless test that uses an x-ray machine to take clear, detailed pictures of your heart. For  further information please visit [email or web address] Please follow instruction sheet BELOW:    Your cardiac CT will be scheduled at one of the below locations:   [facility] [address] [place], [place] [address] [phone]  OR  [facility] [address] [place], [place] [address] [phone]  OR   [facility] [address] [place], [place] [address] [phone]  OR   [facility] [address] [place], [place] [address] [phone]  If scheduled at [facility], please arrive at the [facility] and Children's Entrance (Entrance C2) of [facility] 30 minutes prior to test start time. You can use the FREE valet parking offered at entrance C (encouraged to control the heart rate for the test)  Proceed to the [facility] Radiology Department (first floor) to check-in and test prep.  All radiology patients and guests should use entrance C2 at [facility], accessed from [facility], even though the hospital's physical address listed is [address].    If scheduled at [facility] or [facility], please arrive 15 mins early for check-in and test prep.  There is spacious parking and easy access to the radiology department from the [facility] entrance. Please enter here and check-in with the desk attendant.   If scheduled at [facility], please arrive 30 minutes early for check-in and test prep.  Please follow these instructions carefully (unless otherwise directed):  An IV will be required for this  test and Nitroglycerin will be given.  Hold all erectile dysfunction medications at least 3 days (72 hrs) prior to test. (Ie viagra, cialis, sildenafil, tadalafil, etc)   On the Night Before the Test: Be sure to Drink plenty of water. Do not consume any  caffeinated/decaffeinated beverages or chocolate 12 hours prior to your test. Do not take any antihistamines 12 hours prior to your test.   On the Day of the Test: Drink plenty of water until 1 hour prior to the test. Do not eat any food 1 hour prior to test. You may take your regular medications prior to the test.  Take your regular metoprolol the morning of, take  (Lopressor) 50 mg two hours prior to test.  THIS HAS BEEN SENT TO [facility] If you take Furosemide / please HOLD on the morning of the test. Patients who wear a continuous glucose monitor MUST remove the device prior to scanning. FEMALES- please wear underwire-free bra if available, avoid dresses & tight clothing  After the Test: Drink plenty of water. After receiving IV contrast, you may experience a mild flushed feeling. This is normal. On occasion, you may experience a mild rash up to 24 hours after the test. This is not dangerous. If this occurs, you can take Benadryl 25 mg, Zyrtec, Claritin, or Allegra and increase your fluid intake. (Patients taking Tikosyn should avoid Benadryl, and may take Zyrtec, Claritin, or Allegra) If you experience trouble breathing, this can be serious. If it is severe call 911 IMMEDIATELY. If it is mild, please call our office.  We will call to schedule your test 2-4 weeks out understanding that some insurance companies will need an authorization prior to the service being performed.   For more information and frequently asked questions, please visit our website : [email or web address]  For non-scheduling related questions, please contact the cardiac imaging nurse navigator should you have any questions/concerns: Cardiac Imaging Nurse Navigators Direct Office Dial: [phone]   For scheduling needs, including cancellations and rescheduling, please call [place], [phone].    Follow-Up: At [facility], you and your health needs are our priority.  As part of our  continuing mission to provide you with exceptional heart care, we have created designated Provider Care Teams.  These Care Teams include your primary Cardiologist (physician) and Advanced Practice Providers (APPs -  Physician Assistants and Nurse Practitioners) who all work together to provide you with the care you need, when you need it.  We recommend signing up for the patient portal called "MyChart".  Sign up information is provided on this After Visit Summary.  MyChart is used to connect with patients for Virtual Visits (Telemedicine).  Patients are able to view lab/test results, encounter notes, upcoming appointments, etc.  Non-urgent messages can be sent to your provider as well.   To learn more about what you can do with MyChart, go to [email or web address].    Your next appointment:   3 month(s)  Provider:   Tereso Newcomer, PA-C         Other Instructions     1st Floor: - Lobby - Registration  - Pharmacy  - Lab - Cafe  2nd Floor: - PV Lab - Diagnostic Testing (echo, CT, nuclear med)  3rd Floor: - Vacant  4th Floor: - TCTS (cardiothoracic surgery) - AFib Clinic - Structural Heart Clinic - Vascular Surgery  - Vascular Ultrasound  5th Floor: - HeartCare Cardiology (general and EP) - Clinical Pharmacy for coumadin, hypertension, lipid, weight-loss medications, and med management appointments  Valet parking services will be available as well.

## 2023-05-13 NOTE — Assessment & Plan Note (Deleted)
 10 year ASCVD 5.5%

## 2023-05-14 DIAGNOSIS — N3946 Mixed incontinence: Secondary | ICD-10-CM | POA: Diagnosis not present

## 2023-05-14 DIAGNOSIS — M546 Pain in thoracic spine: Secondary | ICD-10-CM | POA: Diagnosis not present

## 2023-05-14 DIAGNOSIS — N3281 Overactive bladder: Secondary | ICD-10-CM | POA: Diagnosis not present

## 2023-05-14 DIAGNOSIS — M25511 Pain in right shoulder: Secondary | ICD-10-CM | POA: Diagnosis not present

## 2023-05-14 DIAGNOSIS — M5459 Other low back pain: Secondary | ICD-10-CM | POA: Diagnosis not present

## 2023-05-14 DIAGNOSIS — M542 Cervicalgia: Secondary | ICD-10-CM | POA: Diagnosis not present

## 2023-05-14 DIAGNOSIS — R3914 Feeling of incomplete bladder emptying: Secondary | ICD-10-CM | POA: Diagnosis not present

## 2023-05-18 DIAGNOSIS — M542 Cervicalgia: Secondary | ICD-10-CM | POA: Diagnosis not present

## 2023-05-18 DIAGNOSIS — M546 Pain in thoracic spine: Secondary | ICD-10-CM | POA: Diagnosis not present

## 2023-05-18 DIAGNOSIS — N390 Urinary tract infection, site not specified: Secondary | ICD-10-CM | POA: Diagnosis not present

## 2023-05-18 DIAGNOSIS — B9620 Unspecified Escherichia coli [E. coli] as the cause of diseases classified elsewhere: Secondary | ICD-10-CM | POA: Diagnosis not present

## 2023-05-18 DIAGNOSIS — M25511 Pain in right shoulder: Secondary | ICD-10-CM | POA: Diagnosis not present

## 2023-05-18 DIAGNOSIS — M5459 Other low back pain: Secondary | ICD-10-CM | POA: Diagnosis not present

## 2023-05-18 DIAGNOSIS — R3914 Feeling of incomplete bladder emptying: Secondary | ICD-10-CM | POA: Diagnosis not present

## 2023-05-21 DIAGNOSIS — R3914 Feeling of incomplete bladder emptying: Secondary | ICD-10-CM | POA: Diagnosis not present

## 2023-05-21 DIAGNOSIS — N3020 Other chronic cystitis without hematuria: Secondary | ICD-10-CM | POA: Diagnosis not present

## 2023-05-22 ENCOUNTER — Telehealth (HOSPITAL_COMMUNITY): Payer: Self-pay | Admitting: *Deleted

## 2023-05-22 NOTE — Telephone Encounter (Signed)
 Attempted to call patient regarding upcoming cardiac CT appointment. Left message on voicemail with name and callback number  Larey Brick RN Navigator Cardiac Imaging [facility] [phone] Office [phone] Cell

## 2023-05-25 ENCOUNTER — Ambulatory Visit (HOSPITAL_COMMUNITY)
Admission: RE | Admit: 2023-05-25 | Discharge: 2023-05-25 | Disposition: A | Discharge status: Home / Self Care | Source: Ambulatory Visit | Attending: Physician Assistant | Admitting: Physician Assistant

## 2023-05-25 DIAGNOSIS — R072 Precordial pain: Secondary | ICD-10-CM | POA: Insufficient documentation

## 2023-05-25 DIAGNOSIS — E7800 Pure hypercholesterolemia, unspecified: Secondary | ICD-10-CM | POA: Diagnosis not present

## 2023-05-25 DIAGNOSIS — R42 Dizziness and giddiness: Secondary | ICD-10-CM | POA: Diagnosis not present

## 2023-05-25 MED ORDER — IOHEXOL 350 MG/ML SOLN
95.0000 mL | Freq: Once | INTRAVENOUS | Status: AC | PRN
  Administered 2023-05-25: 95 mL via INTRAVENOUS

## 2023-05-25 MED ORDER — NITROGLYCERIN 0.4 MG SL SUBL
SUBLINGUAL_TABLET | SUBLINGUAL | Status: AC
Start: 2023-05-25 — End: ?
  Filled 2023-05-25: qty 2

## 2023-05-25 MED ORDER — NITROGLYCERIN 0.4 MG SL SUBL
0.8000 mg | SUBLINGUAL_TABLET | Freq: Once | SUBLINGUAL | Status: AC
  Administered 2023-05-25: 0.8 mg via SUBLINGUAL

## 2023-05-26 DIAGNOSIS — M542 Cervicalgia: Secondary | ICD-10-CM | POA: Diagnosis not present

## 2023-05-26 DIAGNOSIS — M25511 Pain in right shoulder: Secondary | ICD-10-CM | POA: Diagnosis not present

## 2023-05-26 DIAGNOSIS — M546 Pain in thoracic spine: Secondary | ICD-10-CM | POA: Diagnosis not present

## 2023-05-26 DIAGNOSIS — M5459 Other low back pain: Secondary | ICD-10-CM | POA: Diagnosis not present

## 2023-05-27 ENCOUNTER — Encounter: Payer: Self-pay | Admitting: Physician Assistant

## 2023-05-27 DIAGNOSIS — I2583 Coronary atherosclerosis due to lipid rich plaque: Secondary | ICD-10-CM | POA: Insufficient documentation

## 2023-05-27 HISTORY — DX: Coronary atherosclerosis due to lipid rich plaque: I25.83

## 2023-06-09 DIAGNOSIS — M546 Pain in thoracic spine: Secondary | ICD-10-CM | POA: Diagnosis not present

## 2023-06-09 DIAGNOSIS — M542 Cervicalgia: Secondary | ICD-10-CM | POA: Diagnosis not present

## 2023-06-09 DIAGNOSIS — M25511 Pain in right shoulder: Secondary | ICD-10-CM | POA: Diagnosis not present

## 2023-06-09 DIAGNOSIS — M5459 Other low back pain: Secondary | ICD-10-CM | POA: Diagnosis not present

## 2023-06-10 ENCOUNTER — Ambulatory Visit (HOSPITAL_COMMUNITY): Payer: Self-pay | Attending: Cardiovascular Disease

## 2023-06-10 DIAGNOSIS — E7800 Pure hypercholesterolemia, unspecified: Secondary | ICD-10-CM | POA: Insufficient documentation

## 2023-06-10 DIAGNOSIS — R42 Dizziness and giddiness: Secondary | ICD-10-CM | POA: Insufficient documentation

## 2023-06-10 DIAGNOSIS — R072 Precordial pain: Secondary | ICD-10-CM | POA: Diagnosis not present

## 2023-06-10 DIAGNOSIS — R079 Chest pain, unspecified: Secondary | ICD-10-CM

## 2023-06-10 LAB — ECHOCARDIOGRAM COMPLETE
Area-P 1/2: 3.58 cm2
S' Lateral: 3 cm

## 2023-06-18 DIAGNOSIS — M546 Pain in thoracic spine: Secondary | ICD-10-CM | POA: Diagnosis not present

## 2023-06-18 DIAGNOSIS — M542 Cervicalgia: Secondary | ICD-10-CM | POA: Diagnosis not present

## 2023-06-18 DIAGNOSIS — M5459 Other low back pain: Secondary | ICD-10-CM | POA: Diagnosis not present

## 2023-06-18 DIAGNOSIS — M25511 Pain in right shoulder: Secondary | ICD-10-CM | POA: Diagnosis not present

## 2023-06-23 DIAGNOSIS — M25511 Pain in right shoulder: Secondary | ICD-10-CM | POA: Diagnosis not present

## 2023-06-23 DIAGNOSIS — M5459 Other low back pain: Secondary | ICD-10-CM | POA: Diagnosis not present

## 2023-06-23 DIAGNOSIS — M542 Cervicalgia: Secondary | ICD-10-CM | POA: Diagnosis not present

## 2023-06-23 DIAGNOSIS — M546 Pain in thoracic spine: Secondary | ICD-10-CM | POA: Diagnosis not present

## 2023-06-25 ENCOUNTER — Telehealth: Payer: Self-pay | Admitting: Physical Medicine & Rehabilitation

## 2023-06-25 NOTE — Telephone Encounter (Signed)
 Patient called for advice regarding medication Cymbalta  20mg . She states that she has been on it for around 6 weeks and has been feeling sleepy and generally unhappy. She would like to discontinue the medication if possible.

## 2023-06-27 ENCOUNTER — Other Ambulatory Visit: Payer: Self-pay | Admitting: Physical Medicine & Rehabilitation

## 2023-06-30 DIAGNOSIS — M5459 Other low back pain: Secondary | ICD-10-CM | POA: Diagnosis not present

## 2023-06-30 DIAGNOSIS — M546 Pain in thoracic spine: Secondary | ICD-10-CM | POA: Diagnosis not present

## 2023-06-30 DIAGNOSIS — M542 Cervicalgia: Secondary | ICD-10-CM | POA: Diagnosis not present

## 2023-06-30 DIAGNOSIS — M25511 Pain in right shoulder: Secondary | ICD-10-CM | POA: Diagnosis not present

## 2023-07-03 NOTE — Telephone Encounter (Signed)
 Patient notified

## 2023-07-07 DIAGNOSIS — M546 Pain in thoracic spine: Secondary | ICD-10-CM | POA: Diagnosis not present

## 2023-07-07 DIAGNOSIS — M25511 Pain in right shoulder: Secondary | ICD-10-CM | POA: Diagnosis not present

## 2023-07-07 DIAGNOSIS — M542 Cervicalgia: Secondary | ICD-10-CM | POA: Diagnosis not present

## 2023-07-07 DIAGNOSIS — M5459 Other low back pain: Secondary | ICD-10-CM | POA: Diagnosis not present

## 2023-07-09 DIAGNOSIS — M542 Cervicalgia: Secondary | ICD-10-CM | POA: Diagnosis not present

## 2023-07-09 DIAGNOSIS — M5459 Other low back pain: Secondary | ICD-10-CM | POA: Diagnosis not present

## 2023-07-09 DIAGNOSIS — M25511 Pain in right shoulder: Secondary | ICD-10-CM | POA: Diagnosis not present

## 2023-07-09 DIAGNOSIS — M546 Pain in thoracic spine: Secondary | ICD-10-CM | POA: Diagnosis not present

## 2023-07-16 DIAGNOSIS — M5459 Other low back pain: Secondary | ICD-10-CM | POA: Diagnosis not present

## 2023-07-16 DIAGNOSIS — M546 Pain in thoracic spine: Secondary | ICD-10-CM | POA: Diagnosis not present

## 2023-07-16 DIAGNOSIS — M542 Cervicalgia: Secondary | ICD-10-CM | POA: Diagnosis not present

## 2023-07-16 DIAGNOSIS — M25511 Pain in right shoulder: Secondary | ICD-10-CM | POA: Diagnosis not present

## 2023-07-25 ENCOUNTER — Other Ambulatory Visit: Payer: Self-pay | Admitting: Rheumatology

## 2023-07-28 DIAGNOSIS — E039 Hypothyroidism, unspecified: Secondary | ICD-10-CM | POA: Diagnosis not present

## 2023-07-28 DIAGNOSIS — M25511 Pain in right shoulder: Secondary | ICD-10-CM | POA: Diagnosis not present

## 2023-07-28 DIAGNOSIS — E782 Mixed hyperlipidemia: Secondary | ICD-10-CM | POA: Diagnosis not present

## 2023-07-28 DIAGNOSIS — M546 Pain in thoracic spine: Secondary | ICD-10-CM | POA: Diagnosis not present

## 2023-07-28 DIAGNOSIS — M5459 Other low back pain: Secondary | ICD-10-CM | POA: Diagnosis not present

## 2023-07-28 DIAGNOSIS — R7303 Prediabetes: Secondary | ICD-10-CM | POA: Diagnosis not present

## 2023-07-28 DIAGNOSIS — M542 Cervicalgia: Secondary | ICD-10-CM | POA: Diagnosis not present

## 2023-07-28 DIAGNOSIS — Z6828 Body mass index (BMI) 28.0-28.9, adult: Secondary | ICD-10-CM | POA: Diagnosis not present

## 2023-07-28 DIAGNOSIS — E785 Hyperlipidemia, unspecified: Secondary | ICD-10-CM | POA: Diagnosis not present

## 2023-07-28 DIAGNOSIS — I10 Essential (primary) hypertension: Secondary | ICD-10-CM | POA: Diagnosis not present

## 2023-07-29 DIAGNOSIS — L578 Other skin changes due to chronic exposure to nonionizing radiation: Secondary | ICD-10-CM | POA: Diagnosis not present

## 2023-07-29 DIAGNOSIS — D225 Melanocytic nevi of trunk: Secondary | ICD-10-CM | POA: Diagnosis not present

## 2023-07-29 DIAGNOSIS — L538 Other specified erythematous conditions: Secondary | ICD-10-CM | POA: Diagnosis not present

## 2023-07-29 DIAGNOSIS — D485 Neoplasm of uncertain behavior of skin: Secondary | ICD-10-CM | POA: Diagnosis not present

## 2023-07-29 DIAGNOSIS — C44719 Basal cell carcinoma of skin of left lower limb, including hip: Secondary | ICD-10-CM | POA: Diagnosis not present

## 2023-07-29 DIAGNOSIS — L821 Other seborrheic keratosis: Secondary | ICD-10-CM | POA: Diagnosis not present

## 2023-07-29 DIAGNOSIS — L814 Other melanin hyperpigmentation: Secondary | ICD-10-CM | POA: Diagnosis not present

## 2023-07-29 DIAGNOSIS — L570 Actinic keratosis: Secondary | ICD-10-CM | POA: Diagnosis not present

## 2023-08-03 DIAGNOSIS — H18613 Keratoconus, stable, bilateral: Secondary | ICD-10-CM | POA: Diagnosis not present

## 2023-08-03 DIAGNOSIS — Z961 Presence of intraocular lens: Secondary | ICD-10-CM | POA: Diagnosis not present

## 2023-08-03 DIAGNOSIS — H0589 Other disorders of orbit: Secondary | ICD-10-CM | POA: Diagnosis not present

## 2023-08-03 DIAGNOSIS — H43812 Vitreous degeneration, left eye: Secondary | ICD-10-CM | POA: Diagnosis not present

## 2023-08-07 ENCOUNTER — Encounter: Payer: Self-pay | Attending: Physical Medicine & Rehabilitation | Admitting: Physical Medicine & Rehabilitation

## 2023-08-07 ENCOUNTER — Encounter: Payer: Self-pay | Admitting: Physical Medicine & Rehabilitation

## 2023-08-07 VITALS — BP 127/78 | HR 77 | Ht 65.0 in | Wt 175.6 lb

## 2023-08-07 DIAGNOSIS — M797 Fibromyalgia: Secondary | ICD-10-CM | POA: Insufficient documentation

## 2023-08-07 NOTE — Progress Notes (Signed)
 Subjective:    Patient ID: Tracey Rodriguez, female    DOB: 11-01-1960, 63 y.o.   MRN: [id number]  HPI Still going to Integrative therapy on balance an deep tissue  Multiple drug allergies and intolerance Felt depressed on duloxetine  and this was stopped  She is going through a divorce and is moving out of her home and is planning to sell her home Pain Inventory Average Pain 7 Pain Right Now 7 My pain is sharp, dull, stabbing, and aching  In the last 24 hours, has pain interfered with the following? General activity 9 Relation with others 0 Enjoyment of life 1 What TIME of day is your pain at its worst? morning , daytime, evening, and night Sleep (in general) Fair  Pain is worse with: bending, sitting, inactivity, standing, and some activites Pain improves with: rest, therapy/exercise, and pacing activities Relief from Meds: 3  Family History  Problem Relation Age of Onset   Breast cancer Mother 37   Ovarian cancer Mother    Heart disease Father 67   Heart attack Father    Clotting disorder Father    Rheumatologic disease Father    Crohn's disease Father    Esophageal cancer Brother    Heart attack Brother    Cancer Brother    Lung cancer Brother    Colon polyps Brother    Throat cancer Brother    Colon polyps Brother    Stroke Brother    Breast cancer Maternal Aunt 60   Esophageal cancer Maternal Uncle    Stroke Paternal Aunt    Heart disease Paternal Aunt    Kidney disease Paternal Uncle    Liver cancer Maternal Grandmother    Heart disease Maternal Grandfather    Lung cancer Maternal Grandfather    Irritable bowel syndrome Son    Colon cancer Neg Hx    Rectal cancer Neg Hx    Stomach cancer Neg Hx    Social History   Socioeconomic History   Marital status: Legally Separated    Spouse name: Not on file   Number of children: 2   Years of education: Not on file   Highest education level: Not on file  Occupational History   Not on file  Tobacco Use    Smoking status: Former    Current packs/day: 0.00    Average packs/day: 3.0 packs/day for 22.0 years (66.0 ttl pk-yrs)    Types: Cigarettes    Start date: 02/17/1978    Quit date: 02/18/2000    Years since quitting: 23.4    Passive exposure: Never   Smokeless tobacco: Never  Vaping Use   Vaping status: Never Used  Substance and Sexual Activity   Alcohol use: Yes    Alcohol/week: 0.0 standard drinks of alcohol    Comment: RARELY   Drug use: Never   Sexual activity: Yes    Birth control/protection: Post-menopausal  Other Topics Concern   Not on file  Social History Narrative   She is married, has children.  They are grown.  1 son and 1 daughter has at least 1 grandchild   Former smoker no significant alcohol no drug use   Social Drivers of Corporate investment banker Strain: Not on file  Food Insecurity: No Food Insecurity (03/05/2021)   Received from [facility]   Hunger Vital Sign    Within the past 12 months, you worried that your food would run out before you got the money to buy more.: Never true  Within the past 12 months, the food you bought just didn't last and you didn't have money to get more.: Never true  Transportation Needs: Not on file  Physical Activity: Not on file  Stress: Not on file  Social Connections: Unknown (06/25/2021)   Received from [facility]   Social Network    Social Network: Not on file   Past Surgical History:  Procedure Laterality Date   ABLATION     ANTERIOR (CYSTOCELE) AND POSTERIOR REPAIR (RECTOCELE) WITH XENFORM GRAFT AND SACROSPINOUS FIXATION  09/10/2020   APPENDECTOMY     BILATERAL SALPINGOOPHORECTOMY  09/10/2020   BOTOX  INJECTION N/A 04/30/2023   Procedure: BOTOX  INJECTION;  Surgeon: Roxane Copp, MD;  Location: [facility];  Service: Urology;  Laterality: N/A;  30 MINUTES NEEDED   BREAST EXCISIONAL BIOPSY Left    BREAST SURGERY     left- lumpectomy   CYSTOSCOPY N/A 04/30/2023   Procedure: CYSTOSCOPY;  Surgeon: Roxane Copp, MD;  Location: [facility];  Service: Urology;  Laterality: N/A;   DILATATION & CURRETTAGE/HYSTEROSCOPY WITH RESECTOCOPE N/A 04/05/2012   Procedure: hysteroscopy with endocervical curretting;  Surgeon: Atlas Blank, DO;  Location: [facility];  Service: Gynecology;  Laterality: N/A;   DILATION AND CURETTAGE OF UTERUS     EYE SURGERY Bilateral 10/2017   cataract extraction    LIVER BIOPSY  09/2018   NOSE SURGERY     VAGINAL HYSTERECTOMY  09/10/2020   Past Surgical History:  Procedure Laterality Date   ABLATION     ANTERIOR (CYSTOCELE) AND POSTERIOR REPAIR (RECTOCELE) WITH XENFORM GRAFT AND SACROSPINOUS FIXATION  09/10/2020   APPENDECTOMY     BILATERAL SALPINGOOPHORECTOMY  09/10/2020   BOTOX  INJECTION N/A 04/30/2023   Procedure: BOTOX  INJECTION;  Surgeon: Roxane Copp, MD;  Location: [facility];  Service: Urology;  Laterality: N/A;  30 MINUTES NEEDED   BREAST EXCISIONAL BIOPSY Left    BREAST SURGERY     left- lumpectomy   CYSTOSCOPY N/A 04/30/2023   Procedure: CYSTOSCOPY;  Surgeon: Roxane Copp, MD;  Location: [facility];  Service: Urology;  Laterality: N/A;   DILATATION & CURRETTAGE/HYSTEROSCOPY WITH RESECTOCOPE N/A 04/05/2012   Procedure: hysteroscopy with endocervical curretting;  Surgeon: Atlas Blank, DO;  Location: [facility];  Service: Gynecology;  Laterality: N/A;   DILATION AND CURETTAGE OF UTERUS     EYE SURGERY Bilateral 10/2017   cataract extraction    LIVER BIOPSY  09/2018   NOSE SURGERY     VAGINAL HYSTERECTOMY  09/10/2020   Past Medical History:  Diagnosis Date   Arthritis    deg disc disease  - lower back, neck   Asthma    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, behind right eye   Cancer (HCC)    left breast surgery-lumpectomy   Coronary atherosclerosis due to lipid rich plaque 05/27/2023   CCTA 05/26/2023: Calcium score 0, minimal nonobstructive CAD [possible minimal plaque at distal aspect of very short left main <25, minimal plaque at ostial LAD <25]; TPV 23 mm (31st  percentile)-noncalcific plaque 23 mm    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 (HCC)    Fibromyalgia    GERD (gastroesophageal reflux disease)    Heart murmur    Hx of adenomatous polyp of colon 02/04/2018   Hyperlipidemia    Hypertension    Hypothyroidism    goiter   NAFLD (nonalcoholic fatty liver disease)    Neuromuscular disorder (HCC)    sciatic nerve   PONV (  postoperative nausea and vomiting)    Pre-diabetes    Rectocele 01/27/2018   SVD (spontaneous vaginal delivery)    x 2   BP 127/78   Pulse 77   Ht 5' 5 (1.651 m)   Wt 175 lb 9.6 oz (79.7 kg)   SpO2 99%   BMI 29.22 kg/m   Opioid Risk Score:   Fall Risk Score:  `1  Depression screen [facility] 2/9     08/07/2023   10:11 AM 12/16/2022    2:02 PM 11/28/2022    1:39 PM 08/29/2022    1:54 PM 07/03/2022   10:32 AM 06/13/2022    9:39 AM 10/24/2021    9:29 AM  Depression screen PHQ 2/9  Decreased Interest 0 0 0 0 0 0 0  Down, Depressed, Hopeless 0 0 0 0 0 0 0  PHQ - 2 Score 0 0 0 0 0 0 0     Review of Systems  Musculoskeletal:  Positive for arthralgias, back pain and myalgias.  All other systems reviewed and are negative.      Objective:   Physical Exam  General No acute distress Mood affect appropriate Lumbar spine has normal range of motion she does have pain with lumbar extension and lateral bending more so than with flexion. She has mild tenderness  Palpation bilateral upper traps bilateral lumbar paraspinal have moderate tenderness she has moderate tenderness over the greater trochanters She has no pain with lower extremity range of motion Ambulates without assistive device no evidence of toe drag or knee instability      Assessment & Plan:   1.  Fibromyalgia syndrome with widespread Rodriguez pain.  She has multiple medication intolerances.  She is doing some exercise although from activity standpoint she has been very busy moving which has also aggravated her back pain. We discussed treatment  alternatives and options.  She is already going through some physical therapy, I would recommend acupuncture and we will see if she has insurance coverage for this.  Would do weekly visits x 4 to assess efficacy.

## 2023-08-13 DIAGNOSIS — M5459 Other low back pain: Secondary | ICD-10-CM | POA: Diagnosis not present

## 2023-08-13 DIAGNOSIS — M542 Cervicalgia: Secondary | ICD-10-CM | POA: Diagnosis not present

## 2023-08-13 DIAGNOSIS — M546 Pain in thoracic spine: Secondary | ICD-10-CM | POA: Diagnosis not present

## 2023-08-13 DIAGNOSIS — M25511 Pain in right shoulder: Secondary | ICD-10-CM | POA: Diagnosis not present

## 2023-08-13 NOTE — Progress Notes (Signed)
 OFFICE NOTE:    Date:  08/14/2023  ID:  Tracey Rodriguez, DOB October 18, 1960, MRN [id number] PCP: Frederik Charleston, MD  [facility] Providers Cardiologist:  Candyce Reek, MD       Patient Profile:  Coronary artery disease  FHx of CAD TTE 06/15/15: EF 65-70, no RWMA, NL RVSF CCTA 09/16/18: CAC score 0, no CAD  CCTA 05/26/2023: Calcium score 0, minimal nonobstructive CAD [possible minimal plaque at distal aspect of very short left main <25, minimal plaque at ostial LAD <25]; TPV 23 mm (31st percentile)-noncalcific plaque 23 mm TTE 06/10/2023: EF 60-65, no RWMA, GR 1 DD, normal GLS (-19.6), normal RVSF, normal PASP (RVSP 14.7), AV sclerosis, RAP 3 Hyperlipidemia  Intol of statins Carpal tunnel syndrome Hypothyroidism  Asthma Breast CA Factor V deficiency        Discussed the use of AI scribe software for clinical note transcription with the patient, who gave verbal consent to proceed. History of Present Illness Tracey Rodriguez is a 63 y.o. female who returns for follow up of coronary artery plaque, HL, dizziness. She was last seen 05/13/23. She had been referred back by Neuro for dizziness. Her dizziness seemed c/w vertigo. She was having symptoms of L arm pain as well. CCTA showed mild soft plaque in the LAD, mild TPV and CAC score 0. An echocardiogram showed normal LVF. I deferred having her wear a monitor due to symptoms more c/w vertigo. I started her on Zetia  (intol of statins) for lipids.   She is here alone. Left arm symptoms, described as sharp pain and squeezing, were noted at the last visit but have not recurred. She was seen by PT for balance and vestibular rehab. No recent dizziness or syncope episodes. Recent LDL was done with primary care and has improved. She continues to lose weight with GLP1a.    ROS-See HPI     Studies Reviewed:        Results LABS LDL: 119 (07/28/2023) LDL: 162 (08/13/2021)   Risk Assessment/Calculations:          Physical  Exam:  VS:  BP 118/73   Pulse 72   Ht 5' 6 (1.676 m)   Wt 171 lb 9.6 oz (77.8 kg)   SpO2 94%   BMI 27.70 kg/m        Wt Readings from Last 3 Encounters:  08/14/23 171 lb 9.6 oz (77.8 kg)  08/07/23 175 lb 9.6 oz (79.7 kg)  05/13/23 184 lb 6.4 oz (83.6 kg)    Constitutional:      Appearance: Healthy appearance. Not in distress.  Neck:     Vascular: JVD normal.  Pulmonary:     Breath sounds: Normal breath sounds. No wheezing. No rales.  Cardiovascular:     Normal rate. Regular rhythm.     Murmurs: There is no murmur.  Edema:    Peripheral edema absent.  Abdominal:     Palpations: Abdomen is soft.         Assessment and Plan:    Assessment & Plan Coronary atherosclerosis due to lipid rich plaque Recent CCTA showed noncalcific plaque in the left main and ostial LAD with a calcium score of zero. No chest symptoms suggestive of angina.  - Continue aspirin 81 mg daily - Continue Zetia  10 mg daily - Follow up in one year Pure hypercholesterolemia Intolerant to statins. LDL has significantly improved from 162 mg/dL in June 2023 to 880 mg/dL in June 7974. Currently on weight loss medication and  continues to lose weight and increase activity. Goal LDL is less than 100 mg/dL. If LDL remains above 100 mg/dL, may consider PCSK9 inhibitor. - Continue Zetia  10 mg daily - Encourage continued weight loss and increased activity Dizziness Dizziness has resolved and has not recurred recently. Previous evaluation by a neurologist and treatment for vertigo were conducted. Symptoms were not suggestive of a cardiac rhythm issue, and no heart monitor was set up.  - No further cardiac evaluation needed unless symptoms change      Dispo:  Return in about 1 year (around 08/13/2024) for Routine Follow Up, w/ Glendia Ferrier, PA-C.  Signed, Glendia Ferrier, PA-C

## 2023-08-14 ENCOUNTER — Ambulatory Visit: Attending: Physician Assistant | Admitting: Physician Assistant

## 2023-08-14 ENCOUNTER — Encounter: Payer: Self-pay | Admitting: Physician Assistant

## 2023-08-14 VITALS — BP 118/73 | HR 72 | Ht 66.0 in | Wt 171.6 lb

## 2023-08-14 DIAGNOSIS — E7800 Pure hypercholesterolemia, unspecified: Secondary | ICD-10-CM | POA: Diagnosis not present

## 2023-08-14 DIAGNOSIS — R42 Dizziness and giddiness: Secondary | ICD-10-CM | POA: Diagnosis not present

## 2023-08-14 DIAGNOSIS — I2583 Coronary atherosclerosis due to lipid rich plaque: Secondary | ICD-10-CM

## 2023-08-14 NOTE — Patient Instructions (Signed)
 Medication Instructions:  Your physician recommends that you continue on your current medications as directed. Please refer to the Current Medication list given to you today.  *If you need a refill on your cardiac medications before your next appointment, please call your pharmacy*  Lab Work: None ordered If you have labs (blood work) drawn today and your tests are completely normal, you will receive your results only by: MyChart Message (if you have MyChart) OR A paper copy in the mail If you have any lab test that is abnormal or we need to change your treatment, we will call you to review the results.  Testing/Procedures: None ordered  Follow-Up: At [facility], you and your health needs are our priority.  As part of our continuing mission to provide you with exceptional heart care, our providers are all part of one team.  This team includes your primary Cardiologist (physician) and Advanced Practice Providers or APPs (Physician Assistants and Nurse Practitioners) who all work together to provide you with the care you need, when you need it.  Your next appointment:   1 year(s)  Provider:   Glendia Ferrier, PA-C          We recommend signing up for the patient portal called MyChart.  Sign up information is provided on this After Visit Summary.  MyChart is used to connect with patients for Virtual Visits (Telemedicine).  Patients are able to view lab/test results, encounter notes, upcoming appointments, etc.  Non-urgent messages can be sent to your provider as well.   To learn more about what you can do with MyChart, go to [email or web address].

## 2023-08-14 NOTE — Assessment & Plan Note (Signed)
 Recent CCTA showed noncalcific plaque in the left main and ostial LAD with a calcium score of zero. No chest symptoms suggestive of angina.  - Continue aspirin 81 mg daily - Continue Zetia  10 mg daily - Follow up in one year

## 2023-08-14 NOTE — Assessment & Plan Note (Signed)
 Intolerant to statins. LDL has significantly improved from 162 mg/dL in June 2023 to 880 mg/dL in June 7974. Currently on weight loss medication and continues to lose weight and increase activity. Goal LDL is less than 100 mg/dL. If LDL remains above 100 mg/dL, may consider PCSK9 inhibitor. - Continue Zetia  10 mg daily - Encourage continued weight loss and increased activity

## 2023-08-17 DIAGNOSIS — I10 Essential (primary) hypertension: Secondary | ICD-10-CM | POA: Diagnosis not present

## 2023-08-17 DIAGNOSIS — M797 Fibromyalgia: Secondary | ICD-10-CM | POA: Diagnosis not present

## 2023-08-17 DIAGNOSIS — E782 Mixed hyperlipidemia: Secondary | ICD-10-CM | POA: Diagnosis not present

## 2023-08-17 DIAGNOSIS — E039 Hypothyroidism, unspecified: Secondary | ICD-10-CM | POA: Diagnosis not present

## 2023-08-18 HISTORY — PX: BASAL CELL CARCINOMA EXCISION: SHX1214

## 2023-08-19 DIAGNOSIS — C44719 Basal cell carcinoma of skin of left lower limb, including hip: Secondary | ICD-10-CM | POA: Diagnosis not present

## 2023-08-24 ENCOUNTER — Other Ambulatory Visit: Payer: Self-pay | Admitting: Internal Medicine

## 2023-08-24 DIAGNOSIS — L309 Dermatitis, unspecified: Secondary | ICD-10-CM | POA: Diagnosis not present

## 2023-08-24 DIAGNOSIS — K219 Gastro-esophageal reflux disease without esophagitis: Secondary | ICD-10-CM

## 2023-09-02 DIAGNOSIS — N3020 Other chronic cystitis without hematuria: Secondary | ICD-10-CM | POA: Diagnosis not present

## 2023-09-03 ENCOUNTER — Ambulatory Visit: Admitting: Physical Medicine & Rehabilitation

## 2023-09-03 DIAGNOSIS — M5459 Other low back pain: Secondary | ICD-10-CM | POA: Diagnosis not present

## 2023-09-03 DIAGNOSIS — M542 Cervicalgia: Secondary | ICD-10-CM | POA: Diagnosis not present

## 2023-09-03 DIAGNOSIS — M25511 Pain in right shoulder: Secondary | ICD-10-CM | POA: Diagnosis not present

## 2023-09-03 DIAGNOSIS — M546 Pain in thoracic spine: Secondary | ICD-10-CM | POA: Diagnosis not present

## 2023-09-07 DIAGNOSIS — H43812 Vitreous degeneration, left eye: Secondary | ICD-10-CM | POA: Diagnosis not present

## 2023-09-07 DIAGNOSIS — H2513 Age-related nuclear cataract, bilateral: Secondary | ICD-10-CM | POA: Diagnosis not present

## 2023-09-07 DIAGNOSIS — H43813 Vitreous degeneration, bilateral: Secondary | ICD-10-CM | POA: Diagnosis not present

## 2023-09-07 DIAGNOSIS — H18603 Keratoconus, unspecified, bilateral: Secondary | ICD-10-CM | POA: Diagnosis not present

## 2023-09-07 DIAGNOSIS — H02831 Dermatochalasis of right upper eyelid: Secondary | ICD-10-CM | POA: Diagnosis not present

## 2023-09-07 DIAGNOSIS — H02834 Dermatochalasis of left upper eyelid: Secondary | ICD-10-CM | POA: Diagnosis not present

## 2023-09-09 DIAGNOSIS — Z48817 Encounter for surgical aftercare following surgery on the skin and subcutaneous tissue: Secondary | ICD-10-CM | POA: Diagnosis not present

## 2023-09-10 ENCOUNTER — Ambulatory Visit: Admitting: Physical Medicine & Rehabilitation

## 2023-09-10 DIAGNOSIS — M546 Pain in thoracic spine: Secondary | ICD-10-CM | POA: Diagnosis not present

## 2023-09-10 DIAGNOSIS — M25511 Pain in right shoulder: Secondary | ICD-10-CM | POA: Diagnosis not present

## 2023-09-10 DIAGNOSIS — M5459 Other low back pain: Secondary | ICD-10-CM | POA: Diagnosis not present

## 2023-09-10 DIAGNOSIS — M542 Cervicalgia: Secondary | ICD-10-CM | POA: Diagnosis not present

## 2023-09-14 DIAGNOSIS — N3941 Urge incontinence: Secondary | ICD-10-CM | POA: Diagnosis not present

## 2023-09-14 DIAGNOSIS — R3914 Feeling of incomplete bladder emptying: Secondary | ICD-10-CM | POA: Diagnosis not present

## 2023-09-14 DIAGNOSIS — R350 Frequency of micturition: Secondary | ICD-10-CM | POA: Diagnosis not present

## 2023-09-15 NOTE — Progress Notes (Signed)
 Office Visit Note  Patient: Tracey Rodriguez             Date of Birth: Nov 15, 1960           MRN: [id number]             PCP: Frederik Charleston, MD Referring: Frederik Charleston, MD Visit Date: 09/29/2023 Occupation: @GUAROCC @  Subjective:  Generalized pain  History of Present Illness: Tracey Rodriguez is a 63 y.o. female with osteoarthritis, degenerative disc disease and fibromyalgia syndrome.  She returns today after her last visit in February 2025.  She states she continues to have pain and discomfort in multiple joints.  She complains of discomfort in her bilateral hands, both knees and trochanteric bursa.  She is also having pain and discomfort in the trapezius region and lower back.  She has been going integrative therapies for physical therapy which has been helpful.    Activities of Daily Living:  Patient reports morning stiffness for all day. Patient Reports nocturnal pain.  Difficulty dressing/grooming: Reports Difficulty climbing stairs: Reports Difficulty getting out of chair: Reports Difficulty using hands for taps, buttons, cutlery, and/or writing: Reports  Review of Systems  Constitutional:  Positive for fatigue.  HENT:  Positive for mouth dryness. Negative for mouth sores.   Eyes:  Negative for dryness.  Respiratory:  Negative for shortness of breath.   Cardiovascular:  Negative for chest pain and palpitations.  Gastrointestinal:  Negative for blood in stool, constipation and diarrhea.  Endocrine: Positive for increased urination.  Genitourinary:  Negative for involuntary urination.  Musculoskeletal:  Positive for joint pain, gait problem, joint pain, morning stiffness and muscle tenderness. Negative for joint swelling, myalgias, muscle weakness and myalgias.  Skin:  Positive for hair loss. Negative for color change, rash and sensitivity to sunlight.  Allergic/Immunologic: Negative for susceptible to infections.  Neurological:  Positive for dizziness. Negative for  headaches.  Hematological:  Negative for swollen glands.  Psychiatric/Behavioral:  Negative for depressed mood and sleep disturbance. The patient is not nervous/anxious.     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   Vocal cord dysfunction 05/21/2020   Chronic cough 04/19/2020   Sensorineural hearing loss (SNHL), bilateral 04/04/2020   Osteopenia 12/01/2019   NAFLD (nonalcoholic fatty liver disease) 91/87/7978   Obesity (BMI 30.0-34.9) 09/29/2019   Hyperlipidemia 09/09/2018   Prediabetes 09/09/2018   Hx of adenomatous polyp of colon 02/04/2018   Rectocele 01/27/2018   Change in bowel habits 01/05/2018   Hemorrhoids 01/05/2018   Hoarseness 07/30/2017   Gastroesophageal reflux disease 07/30/2017   Pure hypercholesterolemia 06/03/2017   History of breast cancer 09/11/2016   Other fatigue 03/15/2016   Primary insomnia 03/15/2016   Primary osteoarthritis of both hands 03/15/2016   DDD cervical spine 03/15/2016   Osteoarthritis of lumbar spine 03/15/2016   History of bilateral carpal tunnel release 03/15/2016   Hypothyroidism 03/30/2013   Fibromyalgia 03/30/2013   Breast cancer (HCC) 03/30/2013   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   Dyspnea 02/02/2013   Restrictive lung disease 02/02/2013    Past Medical History:  Diagnosis Date   Arthritis    deg disc disease  - lower back, neck   Asthma    Basal cell carcinoma 2025   Brain lesion 2025   per  patient, behind right eye   Cancer (HCC)    left breast surgery-lumpectomy   Coronary atherosclerosis due to lipid rich plaque 05/27/2023   CCTA 05/26/2023: Calcium score 0, minimal nonobstructive CAD [possible minimal plaque at distal aspect of very short left main <25, minimal plaque at ostial LAD <25]; TPV  23 mm (31st percentile)-noncalcific plaque 23 mm    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 (HCC)    Fibromyalgia    GERD (gastroesophageal reflux disease)    Heart murmur    Hx of adenomatous polyp of colon 02/04/2018   Hyperlipidemia    Hypertension    Hypothyroidism    goiter   NAFLD (nonalcoholic fatty liver disease)    Neuromuscular disorder (HCC)    sciatic nerve   PONV (postoperative nausea and vomiting)    Pre-diabetes    Rectocele 01/27/2018   SVD (spontaneous vaginal delivery)    x 2    Family History  Problem Relation Age of Onset   Breast cancer Mother 38   Ovarian cancer Mother    Heart disease Father 35   Heart attack Father    Clotting disorder Father    Rheumatologic disease Father    Crohn's disease Father    Esophageal cancer Brother    Heart attack Brother    Cancer Brother    Lung cancer Brother    Colon polyps Brother    Throat cancer Brother    Colon polyps Brother    Stroke Brother    Breast cancer Maternal Aunt 60   Esophageal cancer Maternal Uncle    Stroke Paternal Aunt    Heart disease Paternal Aunt    Kidney disease Paternal Uncle    Liver cancer Maternal Grandmother    Heart disease Maternal Grandfather    Lung cancer Maternal Grandfather    Irritable bowel syndrome Son    Colon cancer Neg Hx    Rectal cancer Neg Hx    Stomach cancer Neg Hx    Past Surgical History:  Procedure Laterality Date   ABLATION     ANTERIOR (CYSTOCELE) AND POSTERIOR REPAIR (RECTOCELE) WITH XENFORM GRAFT AND SACROSPINOUS FIXATION  09/10/2020   APPENDECTOMY     BASAL CELL CARCINOMA EXCISION  08/2023   left leg   BILATERAL SALPINGOOPHORECTOMY  09/10/2020   BOTOX  INJECTION N/A 04/30/2023   Procedure: BOTOX  INJECTION;  Surgeon: Elisabeth Valli BIRCH, MD;  Location: [facility];  Service: Urology;  Laterality: N/A;  30 MINUTES NEEDED   BREAST EXCISIONAL BIOPSY Left    BREAST SURGERY     left- lumpectomy   CYSTOSCOPY N/A 04/30/2023   Procedure: CYSTOSCOPY;   Surgeon: Elisabeth Valli BIRCH, MD;  Location: [facility];  Service: Urology;  Laterality: N/A;   DILATATION & CURRETTAGE/HYSTEROSCOPY WITH RESECTOCOPE N/A 04/05/2012   Procedure: hysteroscopy with endocervical curretting;  Surgeon: Donna Just, DO;  Location: [facility];  Service: Gynecology;  Laterality: N/A;   DILATION AND CURETTAGE OF UTERUS     EYE SURGERY Bilateral 10/2017   cataract extraction    LIVER BIOPSY  09/2018   NOSE SURGERY     VAGINAL HYSTERECTOMY  09/10/2020   Social History   Social History Narrative   She is married, has children.  They are grown.  1 son and 1 daughter has at least 1 grandchild   Former smoker no significant alcohol no drug use   Immunization History  Administered Date(s) Administered   Fluzone Influenza virus vaccine,trivalent (IIV3), split virus 12/10/2017  Hepatitis A, Adult 04/20/2014, 10/25/2014   Influenza Split 11/14/2008, 03/04/2011, 03/16/2012, 11/17/2012   Influenza,inj,Quad PF,6+ Mos 12/19/2016, 01/05/2018, 11/19/2018, 02/01/2020   Influenza,inj,quad, With Preservative 03/07/2014   Influenza-Unspecified 01/26/2014, 11/24/2016, 01/23/2019   PFIZER(Purple Top)SARS-COV-2 Vaccination 04/27/2019, 05/18/2019   Pneumococcal Conjugate-13 03/07/2014   Pneumococcal Polysaccharide-23 02/01/2020   Td 04/28/2005   Tdap 10/10/2014     Objective: Vital Signs: BP 138/88 (BP Location: Left Arm, Patient Position: Sitting, Cuff Size: Normal)   Pulse 72   Resp 16   Ht 5' 6 (1.676 m)   Wt 171 lb 3.2 oz (77.7 kg)   BMI 27.63 kg/m    Physical Exam Vitals and nursing note reviewed.  Constitutional:      Appearance: She is well-developed.  HENT:     Head: Normocephalic and atraumatic.  Eyes:     Conjunctiva/sclera: Conjunctivae normal.  Cardiovascular:     Rate and Rhythm: Normal rate and regular rhythm.     Heart sounds: Normal heart sounds.  Pulmonary:     Effort: Pulmonary effort is normal.     Breath sounds: Normal breath sounds.  Abdominal:      General: Bowel sounds are normal.     Palpations: Abdomen is soft.  Musculoskeletal:     Cervical back: Normal range of motion.  Lymphadenopathy:     Cervical: No cervical adenopathy.  Skin:    General: Skin is warm and dry.     Capillary Refill: Capillary refill takes less than 2 seconds.  Neurological:     Mental Status: She is alert and oriented to person, place, and time.  Psychiatric:        Behavior: Behavior normal.      Musculoskeletal Exam: Cervical spine was in good range of motion.  She had bilateral trapezius spasm.  Thoracolumbar scoliosis was noted.  She had discomfort in the lower lumbar region with limited mobility.  She had tenderness over bilateral SI joints.  Shoulders, elbows, wrist joints, MCPs PIPs and DIPs with good range of motion with no synovitis.  Hip joints and knee joints with good range of motion without any warmth swelling or effusion.  There was no tenderness over ankles or MTPs.  CDAI Exam: CDAI Score: -- Patient Global: --; Provider Global: -- Swollen: --; Tender: -- Joint Exam 09/29/2023   No joint exam has been documented for this visit   There is currently no information documented on the homunculus. Go to the Rheumatology activity and complete the homunculus joint exam.  Investigation: No additional findings.  Imaging: No results found.  Recent Labs: Lab Results  Component Value Date   WBC 6.6 04/30/2023   HGB 13.4 04/30/2023   PLT 332 04/30/2023   NA 142 04/30/2023   K 3.8 04/30/2023   CL 109 04/30/2023   CO2 25 04/30/2023   GLUCOSE 108 (H) 04/30/2023   BUN 21 04/30/2023   CREATININE 0.72 04/30/2023   BILITOT 0.3 10/30/2020   ALKPHOS 70 10/30/2020   AST 20 10/30/2020   ALT 26 10/30/2020   PROT 7.3 10/30/2020   ALBUMIN 4.0 10/30/2020   CALCIUM 9.1 04/30/2023   GFRAA 83 09/08/2018    Speciality Comments: No specialty comments available.  Procedures:  No procedures performed Allergies: Amlodipine, Bee venom,  Capsaicin, Etodolac, Sulfa antibiotics, Solifenacin, Pepto-bismol [bismuth], Pork-derived products, Shrimp (diagnostic), Statins, and Sulfonamide derivatives   Assessment / Plan:     Visit Diagnoses: Primary osteoarthritis of both hands-she had bilateral PIP and DIP thickening.  No synovitis was noted.  Joint protection muscle strengthening was discussed.  Primary osteoarthritis of both knees - status post Orthovisc April 01, 2023.  She is doing better with her knee joints after the viscosupplement injections.  No warmth swelling or effusion was noted.  Lower extremity muscle strength exercises were discussed.  Weight loss diet and exercise was discussed.  Patient is going to weight management clinic at [facility].  History of bilateral carpal tunnel release-currently asymptomatic.  Trochanteric bursitis of both hips-she has off-and-on discomfort in the trochanteric region.  She has been going to physical therapy.  Pain in both feet-proper fitting shoes were advised.  DDD (degenerative disc disease), cervical -she continues to have some neck discomfort and bilateral trapezius spasm.  She is on on Lyrica  75 mg twice daily and methocarbamol  500 mg p.o. daily.  Spondylosis of lumbar spine-chronic pain.  Chronic SI joint pain-she continues to have intermittent discomfort.  Stretch exercises were discussed.  Fibromyalgia-she continues to have generalized pain and discomfort and positive tender points.  She is going to physical therapy which has been helpful.  Other fatigue-related to fibromyalgia and insomnia.  She has been involved a lot of stress as she is relocating to a new home.  Primary insomnia-good sleep hygiene was discussed.  History of breast cancer  History of COPD  Factor V deficiency (HCC)  Orders: No orders of the defined types were placed in this encounter.  No orders of the defined types were placed in this encounter.    Follow-Up Instructions: Return in about  6 months (around 03/31/2024) for Osteoarthritis.   Maya Nash, MD  Note - This record has been created using Animal nutritionist.  Chart creation errors have been sought, but may not always  have been located. Such creation errors do not reflect on  the standard of medical care.

## 2023-09-16 DIAGNOSIS — M25511 Pain in right shoulder: Secondary | ICD-10-CM | POA: Diagnosis not present

## 2023-09-16 DIAGNOSIS — M5459 Other low back pain: Secondary | ICD-10-CM | POA: Diagnosis not present

## 2023-09-16 DIAGNOSIS — M546 Pain in thoracic spine: Secondary | ICD-10-CM | POA: Diagnosis not present

## 2023-09-16 DIAGNOSIS — M542 Cervicalgia: Secondary | ICD-10-CM | POA: Diagnosis not present

## 2023-09-29 ENCOUNTER — Ambulatory Visit: Payer: BC Managed Care – PPO | Attending: Rheumatology | Admitting: Rheumatology

## 2023-09-29 ENCOUNTER — Encounter: Payer: Self-pay | Admitting: Rheumatology

## 2023-09-29 ENCOUNTER — Ambulatory Visit: Admitting: Physical Medicine & Rehabilitation

## 2023-09-29 VITALS — BP 138/88 | HR 72 | Resp 16 | Ht 66.0 in | Wt 171.2 lb

## 2023-09-29 DIAGNOSIS — M7062 Trochanteric bursitis, left hip: Secondary | ICD-10-CM

## 2023-09-29 DIAGNOSIS — M79671 Pain in right foot: Secondary | ICD-10-CM

## 2023-09-29 DIAGNOSIS — M79672 Pain in left foot: Secondary | ICD-10-CM

## 2023-09-29 DIAGNOSIS — M797 Fibromyalgia: Secondary | ICD-10-CM

## 2023-09-29 DIAGNOSIS — M47816 Spondylosis without myelopathy or radiculopathy, lumbar region: Secondary | ICD-10-CM

## 2023-09-29 DIAGNOSIS — Z9889 Other specified postprocedural states: Secondary | ICD-10-CM

## 2023-09-29 DIAGNOSIS — M5030 Other cervical disc degeneration, unspecified cervical region: Secondary | ICD-10-CM

## 2023-09-29 DIAGNOSIS — M7061 Trochanteric bursitis, right hip: Secondary | ICD-10-CM

## 2023-09-29 DIAGNOSIS — Z8709 Personal history of other diseases of the respiratory system: Secondary | ICD-10-CM

## 2023-09-29 DIAGNOSIS — M533 Sacrococcygeal disorders, not elsewhere classified: Secondary | ICD-10-CM

## 2023-09-29 DIAGNOSIS — F5101 Primary insomnia: Secondary | ICD-10-CM

## 2023-09-29 DIAGNOSIS — M19042 Primary osteoarthritis, left hand: Secondary | ICD-10-CM

## 2023-09-29 DIAGNOSIS — G8929 Other chronic pain: Secondary | ICD-10-CM

## 2023-09-29 DIAGNOSIS — M19041 Primary osteoarthritis, right hand: Secondary | ICD-10-CM

## 2023-09-29 DIAGNOSIS — M170 Bilateral primary osteoarthritis of knee: Secondary | ICD-10-CM | POA: Diagnosis not present

## 2023-09-29 DIAGNOSIS — R5383 Other fatigue: Secondary | ICD-10-CM

## 2023-09-29 DIAGNOSIS — D682 Hereditary deficiency of other clotting factors: Secondary | ICD-10-CM

## 2023-09-29 DIAGNOSIS — Z853 Personal history of malignant neoplasm of breast: Secondary | ICD-10-CM

## 2023-09-30 DIAGNOSIS — Z48817 Encounter for surgical aftercare following surgery on the skin and subcutaneous tissue: Secondary | ICD-10-CM | POA: Diagnosis not present

## 2023-09-30 DIAGNOSIS — L905 Scar conditions and fibrosis of skin: Secondary | ICD-10-CM | POA: Diagnosis not present

## 2023-10-20 ENCOUNTER — Encounter: Payer: Self-pay | Admitting: Physical Medicine & Rehabilitation

## 2023-10-20 ENCOUNTER — Encounter: Attending: Physical Medicine & Rehabilitation | Admitting: Physical Medicine & Rehabilitation

## 2023-10-20 ENCOUNTER — Ambulatory Visit: Admitting: Physical Medicine & Rehabilitation

## 2023-10-20 VITALS — BP 115/74 | HR 73 | Ht 66.0 in | Wt 171.6 lb

## 2023-10-20 DIAGNOSIS — M533 Sacrococcygeal disorders, not elsewhere classified: Secondary | ICD-10-CM | POA: Insufficient documentation

## 2023-10-20 DIAGNOSIS — M797 Fibromyalgia: Secondary | ICD-10-CM | POA: Insufficient documentation

## 2023-10-20 NOTE — Progress Notes (Signed)
 Subjective:    Patient ID: Tracey Rodriguez, female    DOB: Dec 15, 1960, 63 y.o.   MRN: [id number]  HPI 63 year old female with history of fibroid syndrome as well as sacroiliac disorder. Still going to Integrative therapy for PT, has taken a break now that she is planning to move. Acupuncture not covered in MD office but is at PT office  Last SI nerve block, bilateral L5 dorsal ramus bilateral S1-S2-S3 lateral branch blocks Oct 2024, the patient has been moving and has had some aggravation low back pain. Pain Inventory Average Pain 7 Pain Right Now 5 My pain is sharp, dull, stabbing, and aching  In the last 24 hours, has pain interfered with the following? General activity 6 Relation with others 0 Enjoyment of life 0 What TIME of day is your pain at its worst? morning , daytime, evening, and night Sleep (in general) Fair  Pain is worse with: walking, bending, sitting, standing, and some activites Pain improves with: therapy/exercise Relief from Meds: 1  Family History  Problem Relation Age of Onset   Breast cancer Mother 80   Ovarian cancer Mother    Heart disease Father 75   Heart attack Father    Clotting disorder Father    Rheumatologic disease Father    Crohn's disease Father    Esophageal cancer Brother    Heart attack Brother    Cancer Brother    Lung cancer Brother    Colon polyps Brother    Throat cancer Brother    Colon polyps Brother    Stroke Brother    Breast cancer Maternal Aunt 60   Esophageal cancer Maternal Uncle    Stroke Paternal Aunt    Heart disease Paternal Aunt    Kidney disease Paternal Uncle    Liver cancer Maternal Grandmother    Heart disease Maternal Grandfather    Lung cancer Maternal Grandfather    Irritable bowel syndrome Son    Colon cancer Neg Hx    Rectal cancer Neg Hx    Stomach cancer Neg Hx    Social History   Socioeconomic History   Marital status: Legally Separated    Spouse name: Not on file   Number of children: 2    Years of education: Not on file   Highest education level: Not on file  Occupational History   Not on file  Tobacco Use   Smoking status: Former    Current packs/day: 0.00    Average packs/day: 3.0 packs/day for 22.0 years (66.0 ttl pk-yrs)    Types: Cigarettes    Start date: 02/17/1978    Quit date: 02/18/2000    Years since quitting: 23.6    Passive exposure: Never   Smokeless tobacco: Never  Vaping Use   Vaping status: Never Used  Substance and Sexual Activity   Alcohol use: Yes    Alcohol/week: 0.0 standard drinks of alcohol    Comment: RARELY   Drug use: Never   Sexual activity: Yes    Birth control/protection: Post-menopausal  Other Topics Concern   Not on file  Social History Narrative   She is married, has children.  They are grown.  1 son and 1 daughter has at least 1 grandchild   Former smoker no significant alcohol no drug use   Social Drivers of Corporate investment banker Strain: Not on file  Food Insecurity: No Food Insecurity (03/05/2021)   Received from [facility]   Hunger Vital Sign    Within the  past 12 months, you worried that your food would run out before you got the money to buy more.: Never true    Within the past 12 months, the food you bought just didn't last and you didn't have money to get more.: Never true  Transportation Needs: Not on file  Physical Activity: Not on file  Stress: Not on file  Social Connections: Unknown (06/25/2021)   Received from [facility]   Social Network    Social Network: Not on file   Past Surgical History:  Procedure Laterality Date   ABLATION     ANTERIOR (CYSTOCELE) AND POSTERIOR REPAIR (RECTOCELE) WITH XENFORM GRAFT AND SACROSPINOUS FIXATION  09/10/2020   APPENDECTOMY     BASAL CELL CARCINOMA EXCISION  08/2023   left leg   BILATERAL SALPINGOOPHORECTOMY  09/10/2020   BOTOX  INJECTION N/A 04/30/2023   Procedure: BOTOX  INJECTION;  Surgeon: Elisabeth Valli BIRCH, MD;  Location: [facility];  Service: Urology;   Laterality: N/A;  30 MINUTES NEEDED   BREAST EXCISIONAL BIOPSY Left    BREAST SURGERY     left- lumpectomy   CYSTOSCOPY N/A 04/30/2023   Procedure: CYSTOSCOPY;  Surgeon: Elisabeth Valli BIRCH, MD;  Location: [facility];  Service: Urology;  Laterality: N/A;   DILATATION & CURRETTAGE/HYSTEROSCOPY WITH RESECTOCOPE N/A 04/05/2012   Procedure: hysteroscopy with endocervical curretting;  Surgeon: Donna Just, DO;  Location: [facility];  Service: Gynecology;  Laterality: N/A;   DILATION AND CURETTAGE OF UTERUS     EYE SURGERY Bilateral 10/2017   cataract extraction    LIVER BIOPSY  09/2018   NOSE SURGERY     VAGINAL HYSTERECTOMY  09/10/2020   Past Surgical History:  Procedure Laterality Date   ABLATION     ANTERIOR (CYSTOCELE) AND POSTERIOR REPAIR (RECTOCELE) WITH XENFORM GRAFT AND SACROSPINOUS FIXATION  09/10/2020   APPENDECTOMY     BASAL CELL CARCINOMA EXCISION  08/2023   left leg   BILATERAL SALPINGOOPHORECTOMY  09/10/2020   BOTOX  INJECTION N/A 04/30/2023   Procedure: BOTOX  INJECTION;  Surgeon: Elisabeth Valli BIRCH, MD;  Location: [facility];  Service: Urology;  Laterality: N/A;  30 MINUTES NEEDED   BREAST EXCISIONAL BIOPSY Left    BREAST SURGERY     left- lumpectomy   CYSTOSCOPY N/A 04/30/2023   Procedure: CYSTOSCOPY;  Surgeon: Elisabeth Valli BIRCH, MD;  Location: [facility];  Service: Urology;  Laterality: N/A;   DILATATION & CURRETTAGE/HYSTEROSCOPY WITH RESECTOCOPE N/A 04/05/2012   Procedure: hysteroscopy with endocervical curretting;  Surgeon: Donna Just, DO;  Location: [facility];  Service: Gynecology;  Laterality: N/A;   DILATION AND CURETTAGE OF UTERUS     EYE SURGERY Bilateral 10/2017   cataract extraction    LIVER BIOPSY  09/2018   NOSE SURGERY     VAGINAL HYSTERECTOMY  09/10/2020   Past Medical History:  Diagnosis Date   Arthritis    deg disc disease  - lower back, neck   Asthma    Basal cell carcinoma 2025   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, behind right eye   Cancer (HCC)    left  breast surgery-lumpectomy   Coronary atherosclerosis due to lipid rich plaque 05/27/2023   CCTA 05/26/2023: Calcium score 0, minimal nonobstructive CAD [possible minimal plaque at distal aspect of very short left main <25, minimal plaque at ostial LAD <25]; TPV 23 mm (31st percentile)-noncalcific plaque 23 mm    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 (HCC)    Fibromyalgia    GERD (  gastroesophageal reflux disease)    Heart murmur    Hx of adenomatous polyp of colon 02/04/2018   Hyperlipidemia    Hypertension    Hypothyroidism    goiter   NAFLD (nonalcoholic fatty liver disease)    Neuromuscular disorder (HCC)    sciatic nerve   PONV (postoperative nausea and vomiting)    Pre-diabetes    Rectocele 01/27/2018   SVD (spontaneous vaginal delivery)    x 2   BP 115/74   Pulse 73   Ht 5' 6 (1.676 m)   Wt 171 lb 9.6 oz (77.8 kg)   SpO2 96%   BMI 27.70 kg/m   Opioid Risk Score:   Fall Risk Score:  `1  Depression screen [facility] 2/9     10/20/2023    9:41 AM 08/07/2023   10:11 AM 12/16/2022    2:02 PM 11/28/2022    1:39 PM 08/29/2022    1:54 PM 07/03/2022   10:32 AM 06/13/2022    9:39 AM  Depression screen PHQ 2/9  Decreased Interest 0 0 0 0 0 0 0  Down, Depressed, Hopeless 0 0 0 0 0 0 0  PHQ - 2 Score 0 0 0 0 0 0 0     Review of Systems  Musculoskeletal:  Positive for arthralgias, back pain, myalgias and neck pain.       Objective:   Physical Exam General No acute distress Mood and affect appropriate Extremities without edema Motor strength is 5/5 bilateral hip flexor knee extensor ankle dorsiflexion Negative straight leg raising test bilaterally Sensation normal bilateral lower extremities Ambulates without assistive device no evidence of toe drag and instability Gaenslens: Positive bilateral Sacral thrust (prone) : Positive bilateral Lateral compression: Negative except that lateral hip FABER's: Negative  Distraction (supine): Negative Thigh thrust test:  Positive bilateral 1/18 tender points today right upper trap      Assessment & Plan:   1.  Fibromyalgia syndrome which has been doing quite well overall.  Main issue is right upper trap. 2.  Sacroiliac disorder bilateral some worsening in recent months likely due to kneeling activities.  Will set her up for repeat L5 dorsal ramus S1-S2-S3 lateral branch blocks under fluoroscopic guidance has had prolonged relief with this procedure.

## 2023-11-09 ENCOUNTER — Other Ambulatory Visit: Payer: Self-pay | Admitting: Rheumatology

## 2023-11-09 NOTE — Telephone Encounter (Signed)
 Last Fill: 07/27/2023  Next Visit: 03/31/2024  Last Visit: 09/29/2023  DX: DDD (degenerative disc disease), cervical   Current Dose per office note on 09/29/2023: methocarbamol  500 mg p.o. daily.   Okay to refill methocarbamol ?

## 2023-11-23 NOTE — Progress Notes (Unsigned)
   PROCEDURE RECORD [facility]   Name: Tracey Rodriguez DOB:05-Oct-1960 MRN: [id number]  Date:11/23/2023  Physician: Dr. Prentice Compton MD    Nurse/CMA: Tye Kitty MA  Allergies:  Allergies  Allergen Reactions   Amlodipine Itching   Bee Venom Anaphylaxis and Shortness Of Breath    Angioedema    Capsaicin     Burned skin   Etodolac Shortness Of Breath    dyspnea/tightness in chest   Sulfa Antibiotics Nausea And Vomiting   Solifenacin Rash   Pepto-Bismol [Bismuth] Nausea And Vomiting   Pork-Derived Products Nausea And Vomiting   Shrimp (Diagnostic) Nausea And Vomiting   Statins Itching   Sulfonamide Derivatives Nausea And Vomiting   Duloxetine  Hcl Other (See Comments)    Caused depression    Consent Signed: {yes [id number]}  Is patient diabetic? {yes no:314532}  CBG today? ***  Pregnant: {yes no:314532} LMP: No LMP recorded. Patient is postmenopausal. (age 49-55)  Anticoagulants: {Yes/No:19989} Anti-inflammatory: {Yes/No:19989} Antibiotics: {Yes/No:19989}  Procedure: Bilateral Sacroiliac Injections  Position: Prone Start Time: ***  End Time: ***  Fluoro Time: ***  RN/CMA      Time      BP      Pulse      Respirations      O2 Sat      S/S      Pain Level       D/C home with ***, patient A & O X 3, D/C instructions reviewed, and sits independently.

## 2023-11-26 ENCOUNTER — Encounter: Payer: Self-pay | Admitting: Physical Medicine & Rehabilitation

## 2023-11-26 ENCOUNTER — Encounter: Attending: Physical Medicine & Rehabilitation | Admitting: Physical Medicine & Rehabilitation

## 2023-11-26 VITALS — BP 121/83 | HR 77 | Ht 66.0 in | Wt 161.0 lb

## 2023-11-26 DIAGNOSIS — M533 Sacrococcygeal disorders, not elsewhere classified: Secondary | ICD-10-CM | POA: Diagnosis not present

## 2023-11-26 MED ORDER — LIDOCAINE HCL (PF) 2 % IJ SOLN
2.0000 mL | Freq: Once | INTRAMUSCULAR | Status: AC
  Administered 2023-11-26: 2 mL

## 2023-11-26 MED ORDER — LIDOCAINE HCL 1 % IJ SOLN
5.0000 mL | Freq: Once | INTRAMUSCULAR | Status: AC
  Administered 2023-11-26: 5 mL

## 2023-11-26 MED ORDER — BETAMETHASONE SOD PHOS & ACET 6 (3-3) MG/ML IJ SUSP
6.0000 mg | Freq: Once | INTRAMUSCULAR | Status: AC
  Administered 2023-11-26: 6 mg via INTRA_ARTICULAR

## 2023-11-26 MED ORDER — IOHEXOL 180 MG/ML  SOLN
2.0000 mL | Freq: Once | INTRAMUSCULAR | Status: AC
  Administered 2023-11-26: 2 mL

## 2023-11-26 NOTE — Progress Notes (Signed)
 Bilateral sacroiliac injections   Indication: Left Low back and buttocks pain not relieved by medication management and other conservative care.  Informed consent was obtained after describing risks and benefits of the procedure with the patient, this includes bleeding, bruising, infection, paralysis and medication side effects. The patient wishes to proceed and has given written consent. The patient was placed in a prone position. The lumbar and sacral area was marked and prepped with Betadine. A 25-gauge 1-1/2 inch needle was inserted into the skin and subcutaneous tissue and 1 mL of 1% lidocaine  was injected. Then a 25-gauge 3 inch spinal needle was inserted under fluoroscopic guidance into the left sacroiliac joint. AP and lateral images were utilized. Omnipaque  180x0.5 mL under live fluoroscopy demonstrated no intravascular uptake. Then a solution containing one ML of 6 mg per mLbetamethasone and 2 ML of 2% lidocaine  MPF was injected x1.5 mL.This same procedure was performed on the right side with same needle , technique and injectate.  Patient tolerated the procedure well. Post procedure instructions were given. Please see post procedure form.

## 2023-12-18 ENCOUNTER — Other Ambulatory Visit: Payer: Self-pay | Admitting: Urology

## 2024-01-05 NOTE — Patient Instructions (Signed)
 SURGICAL WAITING ROOM VISITATION Patients having surgery or a procedure may have no more than 2 support people in the waiting area - these visitors may rotate.    Children under the age of 14 must have an adult with them who is not the patient.  If the patient needs to stay at the hospital during part of their recovery, the visitor guidelines for inpatient rooms apply. Pre-op nurse will coordinate an appropriate time for 1 support person to accompany patient in pre-op.  This support person may not rotate.    Please refer to the [facility] website for the visitor guidelines for Inpatients (after your surgery is over and you are in a regular room).       Your procedure is scheduled on: 01-19-24   Report to [facility] Main Entrance    Report to admitting at 1:45 PM   Call this number if you have problems the morning of surgery [phone]   Do not eat food or drink liquids:After Midnight.           If you have questions, please contact your surgeon's office.   FOLLOW ANY ADDITIONAL PRE OP INSTRUCTIONS YOU RECEIVED FROM YOUR SURGEON'S OFFICE!!!  Oral Hygiene is also important to reduce your risk of infection.                                    Remember - BRUSH YOUR TEETH THE MORNING OF SURGERY WITH YOUR REGULAR TOOTHPASTE   Do NOT smoke after Midnight   Take these medicines the morning of surgery with A SIP OF WATER:    Claritin   Dexilant    Ezetimibe    Levothyroxine   Metoprolol    Pepcid    Okay to use inhalers, nasal spray   Tylenol  if needed  Stop all vitamins and herbal supplements 7 days before surgery  Hold Zepbound 7 days before surgery (do not take after 01-11-24)  Hold Aspirin 5 days before surgery (do not take after 01-13-24)                              You may not have any metal on your body including hair pins, jewelry, and body piercing             Do not wear make-up, lotions, powders, perfumes or deodorant  Do not wear nail polish including gel  and S&S, artificial/acrylic nails, or any other type of covering on natural nails including finger and toenails. If you have artificial nails, gel coating, etc. that needs to be removed by a nail salon please have this removed prior to surgery or surgery may need to be canceled/ delayed if the surgeon/ anesthesia feels like they are unable to be safely monitored.   Do not shave  48 hours prior to surgery.            Do not bring valuables to the hospital. [facility] IS NOT RESPONSIBLE   FOR VALUABLES.   Contacts, dentures or bridgework may not be worn into surgery.  DO NOT BRING YOUR HOME MEDICATIONS TO THE HOSPITAL. PHARMACY WILL DISPENSE MEDICATIONS LISTED ON YOUR MEDICATION LIST TO YOU DURING YOUR ADMISSION IN THE HOSPITAL!    Patients discharged on the day of surgery will not be allowed to drive home.  Someone NEEDS to stay with you for the first 24 hours after anesthesia.  Please read over the following fact sheets you were given: IF YOU HAVE QUESTIONS ABOUT YOUR PRE-OP INSTRUCTIONS PLEASE CALL [phone] G[facility]  If you received a COVID test during your pre-op visit  it is requested that you wear a mask when out in public, stay away from anyone that may not be feeling well and notify your surgeon if you develop symptoms. If you test positive for Covid or have been in contact with anyone that has tested positive in the last 10 days please notify you surgeon.  [facility] - Preparing for Surgery Before surgery, you can play an important role.  Because skin is not sterile, your skin needs to be as free of germs as possible.  You can reduce the number of germs on your skin by washing with CHG (chlorahexidine gluconate) soap before surgery.  CHG is an antiseptic cleaner which kills germs and bonds with the skin to continue killing germs even after washing. Please DO NOT use if you have an allergy to CHG or antibacterial soaps.  If your skin becomes reddened/irritated stop using the  CHG and inform your nurse when you arrive at Short Stay. Do not shave (including legs and underarms) for at least 48 hours prior to the first CHG shower.  You may shave your face/neck.  Please follow these instructions carefully:  1.  Shower with CHG Soap the night before surgery and the  morning of surgery.  2.  If you choose to wash your hair, wash your hair first as usual with your normal  shampoo.  3.  After you shampoo, rinse your hair and body thoroughly to remove the shampoo.                             4.  Use CHG as you would any other liquid soap.  You can apply chg directly to the skin and wash.  Gently with a scrungie or clean washcloth.  5.  Apply the CHG Soap to your body ONLY FROM THE NECK DOWN.   Do   not use on face/ open                           Wound or open sores. Avoid contact with eyes, ears mouth and   genitals (private parts).                       Wash face,  Genitals (private parts) with your normal soap.             6.  Wash thoroughly, paying special attention to the area where your    surgery  will be performed.  7.  Thoroughly rinse your body with warm water from the neck down.  8.  DO NOT shower/wash with your normal soap after using and rinsing off the CHG Soap.                9.  Pat yourself dry with a clean towel.            10.  Wear clean pajamas.            11.  Place clean sheets on your bed the night of your first shower and do not  sleep with pets. Day of Surgery : Do not apply any lotions/deodorants the morning of surgery.  Please wear clean clothes to the hospital/surgery center.  FAILURE TO  FOLLOW THESE INSTRUCTIONS MAY RESULT IN THE CANCELLATION OF YOUR SURGERY  PATIENT SIGNATURE_________________________________  NURSE SIGNATURE__________________________________  ________________________________________________________________________

## 2024-01-05 NOTE — Progress Notes (Addendum)
 Date of COVID positive in last 90 days:  PCP - Lamar Ng, MD Cardiologist - Oneil Parchment, MD Neurologist - Leeroy Blanch, DO Pulmonologist - Lamar Brick, MD (last OV 2022)  Chest x-ray - N/A EKG - 05-13-23 Epic Stress Test - 06-15-15 Epic ECHO - 06-10-23 Epic Cardiac Cath - N/A Coronary CT - 05-25-23 Epic Pacemaker/ICD device last checked:N/A Spinal Cord Stimulator:N/A  Bowel Prep - N/A  Sleep Study -  Yes,  CPAP -   Fasting Blood Sugar - N/A Checks Blood Sugar _____ times a day  Zepbound q week  Last dose of GLP1 agonist-  N/A GLP1 instructions:  Do not take after  01-11-24   Last dose of SGLT-2 inhibitors-  N/A SGLT-2 instructions:  Do not take after     Blood Thinner Instructions: N/A Last dose:   Time: Aspirin Instructions:  ASA 81 Last Dose:  Activity level:  Can go up a flight of stairs and perform activities of daily living without stopping and without symptoms of chest pain or shortness of breath.  Able to exercise without symptoms  Unable to go up a flight of stairs without symptoms of     Anesthesia review: Coronary atherosclerosis, Factor V deficiency, murmur,   Patient denies shortness of breath, fever, cough and chest pain at PAT appointment  Patient verbalized understanding of instructions that were given to them at the PAT appointment. Patient was also instructed that they will need to review over the PAT instructions again at home before surgery.

## 2024-01-06 ENCOUNTER — Encounter (HOSPITAL_COMMUNITY)
Admission: RE | Admit: 2024-01-06 | Discharge: 2024-01-06 | Disposition: A | Discharge status: Home / Self Care | Source: Ambulatory Visit | Attending: Urology | Admitting: Urology

## 2024-01-06 ENCOUNTER — Encounter (HOSPITAL_COMMUNITY): Payer: Self-pay

## 2024-01-06 ENCOUNTER — Other Ambulatory Visit: Payer: Self-pay

## 2024-01-06 VITALS — BP 136/74 | HR 73 | Temp 98.7°F | Resp 16 | Ht 65.5 in | Wt 162.4 lb

## 2024-01-06 DIAGNOSIS — Z01812 Encounter for preprocedural laboratory examination: Secondary | ICD-10-CM | POA: Diagnosis present

## 2024-01-06 DIAGNOSIS — K219 Gastro-esophageal reflux disease without esophagitis: Secondary | ICD-10-CM | POA: Diagnosis not present

## 2024-01-06 DIAGNOSIS — H5461 Unqualified visual loss, right eye, normal vision left eye: Secondary | ICD-10-CM | POA: Diagnosis not present

## 2024-01-06 DIAGNOSIS — I10 Essential (primary) hypertension: Secondary | ICD-10-CM | POA: Diagnosis not present

## 2024-01-06 DIAGNOSIS — M797 Fibromyalgia: Secondary | ICD-10-CM | POA: Insufficient documentation

## 2024-01-06 DIAGNOSIS — I2583 Coronary atherosclerosis due to lipid rich plaque: Secondary | ICD-10-CM | POA: Insufficient documentation

## 2024-01-06 DIAGNOSIS — R7303 Prediabetes: Secondary | ICD-10-CM | POA: Insufficient documentation

## 2024-01-06 DIAGNOSIS — N3941 Urge incontinence: Secondary | ICD-10-CM | POA: Insufficient documentation

## 2024-01-06 DIAGNOSIS — M1990 Unspecified osteoarthritis, unspecified site: Secondary | ICD-10-CM | POA: Diagnosis not present

## 2024-01-06 DIAGNOSIS — K760 Fatty (change of) liver, not elsewhere classified: Secondary | ICD-10-CM | POA: Insufficient documentation

## 2024-01-06 DIAGNOSIS — G8929 Other chronic pain: Secondary | ICD-10-CM | POA: Insufficient documentation

## 2024-01-06 DIAGNOSIS — Z87891 Personal history of nicotine dependence: Secondary | ICD-10-CM | POA: Diagnosis not present

## 2024-01-06 DIAGNOSIS — K769 Liver disease, unspecified: Secondary | ICD-10-CM

## 2024-01-06 DIAGNOSIS — D333 Benign neoplasm of cranial nerves: Secondary | ICD-10-CM | POA: Insufficient documentation

## 2024-01-06 DIAGNOSIS — J45909 Unspecified asthma, uncomplicated: Secondary | ICD-10-CM | POA: Diagnosis not present

## 2024-01-06 HISTORY — DX: Dyspnea, unspecified: R06.00

## 2024-01-06 HISTORY — DX: Unspecified malignant neoplasm of skin, unspecified: C44.90

## 2024-01-06 LAB — COMPREHENSIVE METABOLIC PANEL WITH GFR
ALT: 26 U/L (ref 0–44)
AST: 28 U/L (ref 15–41)
Albumin: 4.5 g/dL (ref 3.5–5.0)
Alkaline Phosphatase: 65 U/L (ref 38–126)
Anion gap: 11 (ref 5–15)
BUN: 19 mg/dL (ref 8–23)
CO2: 25 mmol/L (ref 22–32)
Calcium: 9.8 mg/dL (ref 8.9–10.3)
Chloride: 103 mmol/L (ref 98–111)
Creatinine, Ser: 0.77 mg/dL (ref 0.44–1.00)
GFR, Estimated: >60 mL/min (ref >60)
Glucose, Bld: 87 mg/dL (ref 70–99)
Potassium: 3.9 mmol/L (ref 3.5–5.1)
Sodium: 140 mmol/L (ref 135–145)
Total Bilirubin: 0.5 mg/dL (ref 0.0–1.2)
Total Protein: 7 g/dL (ref 6.5–8.1)

## 2024-01-06 LAB — URINALYSIS, ROUTINE W REFLEX MICROSCOPIC
Bilirubin Urine: NEGATIVE
Glucose, UA: NEGATIVE mg/dL
Hgb urine dipstick: NEGATIVE
Ketones, ur: 5 mg/dL — AB
Leukocytes,Ua: NEGATIVE
Nitrite: NEGATIVE
Protein, ur: NEGATIVE mg/dL
Specific Gravity, Urine: 1.005 (ref 1.005–1.030)
pH: 5 (ref 5.0–8.0)

## 2024-01-07 NOTE — Progress Notes (Signed)
 Urine culture results sent to Dr. Elisabeth to review.

## 2024-01-08 NOTE — Anesthesia Preprocedure Evaluation (Addendum)
 Anesthesia Evaluation  Patient identified by MRN, date of birth, ID band Patient awake    Reviewed: Allergy & Precautions, NPO status , Patient's Chart, lab work & pertinent test results, reviewed documented beta blocker date and time   History of Anesthesia Complications (+) PONV and history of anesthetic complications  Airway Mallampati: III  TM Distance: >3 FB Neck ROM: Full    Dental  (+) Teeth Intact, Dental Advisory Given   Pulmonary asthma (albuterol  last used 3d, uses whenever walking or going up stairs) , former smoker Former smoker, 66 pack year history    Pulmonary exam normal breath sounds clear to auscultation       Cardiovascular hypertension (172/81 preop, per pt normally 130s), Pt. on medications and Pt. on home beta blockers + CAD  Normal cardiovascular exam Rhythm:Regular Rate:Normal     Neuro/Psych negative neurological ROS  negative psych ROS   GI/Hepatic Neg liver ROS,GERD  Poorly Controlled,,  Endo/Other  diabetes (prediabetic)Hypothyroidism    Renal/GU negative Renal ROS Bladder dysfunction      Musculoskeletal  (+) Arthritis , Osteoarthritis,  Fibromyalgia -  Abdominal   Peds  Hematology  (+) Blood dyscrasia Factor V deficiency    Anesthesia Other Findings Zepbound LD: 8d  Reproductive/Obstetrics negative OB ROS                              Anesthesia Physical Anesthesia Plan  ASA: 3  Anesthesia Plan: MAC   Post-op Pain Management: Tylenol  PO (pre-op)*   Induction:   PONV Risk Score and Plan: 4 or greater and Dexamethasone , Propofol  infusion, TIVA, Midazolam , Treatment may vary due to age or medical condition and Amisulpride  Airway Management Planned: LMA  Additional Equipment: None  Intra-op Plan:   Post-operative Plan:   Informed Consent: I have reviewed the patients History and Physical, chart, labs and discussed the procedure including the  risks, benefits and alternatives for the proposed anesthesia with the patient or authorized representative who has indicated his/her understanding and acceptance.       Plan Discussed with: CRNA  Anesthesia Plan Comments: (Allergy to etodolac- no toradol   Last injection under MAC 04/2023- no issues)         Anesthesia Quick Evaluation

## 2024-01-08 NOTE — Progress Notes (Signed)
 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   Prior anesthesia complications include PONV. Patient had similar procedure on 04/30/23 which was without complications.  Patient evaluated by Cardiology in March 2025 for chest pain and dizziness. Coronary CTA and echo were done. CTA showed noncalcific plaque in the left main and ostial LAD with a calcium score of zero. Echo showed normal LVEF 60-65%, grade I DD, no significant valvular abnormality. She was last seen in clinic on 08/14/23 and advised f/u in 1 year.  VS: BP 136/74   Pulse 73   Temp 37.1 C (Oral)   Resp 16   Ht 5' 5.5 (1.664 m)   Wt 73.7 kg   SpO2 100%   BMI 26.61 kg/m   PROVIDERS: Frederik Charleston, MD   LABS: Labs reviewed: Acceptable for surgery. (all labs ordered are listed, but only abnormal results are displayed)  Labs Reviewed  URINE CULTURE - Abnormal; Notable for the following components:      Result Value   Culture MULTIPLE SPECIES PRESENT, SUGGEST RECOLLECTION (*)    All other components within normal limits  URINALYSIS, ROUTINE W REFLEX MICROSCOPIC - Abnormal; Notable for the following components:   Color, Urine STRAW (*)    Ketones, ur 5 (*)    All other components within normal limits  COMPREHENSIVE METABOLIC PANEL WITH GFR    EKG 05/13/23:  Normal sinus rhythm Septal infarct (cited on or before 27-Mar-2013) No significant change since last tracing  Echo 06/10/23:  IMPRESSIONS    1. Left ventricular  ejection fraction, by estimation, is 60 to 65%. Left ventricular ejection fraction by 3D volume is 59 %. The left ventricle has normal function. The left ventricle has no regional wall motion abnormalities. Left ventricular diastolic  parameters are consistent with Grade I diastolic dysfunction (impaired relaxation). The average left ventricular global longitudinal strain is -19.6 %. The global longitudinal strain is normal.  2. Right ventricular systolic function is normal. The right ventricular size is normal. There is normal pulmonary artery systolic pressure. The estimated right ventricular systolic pressure is 14.7 mmHg.  3. The mitral valve is normal in structure. No evidence of mitral valve regurgitation. No evidence of mitral stenosis.  4. The aortic valve is tricuspid. There is mild thickening of the aortic valve. Aortic valve regurgitation is not visualized. Aortic valve sclerosis is present, with no evidence of aortic valve stenosis.  5. The inferior vena cava is normal in size with greater than 50% respiratory variability, suggesting right atrial pressure of 3 mmHg.  Comparison(s): No significant change from prior study. Prior images reviewed side by side.  Coronary CTA 05/25/23:  IMPRESSION: 1. Possible minimal CAD vs minimal narrowing of the ostial LAD with separate ostia of the LAD and LCx and no significant left main segment, <25% stenosis, CADRADS 1.   2. Total plaque volume 23 mm3 which is 31st percentile for age- and sex-matched controls (calcified plaque 0 mm3; non-calcified plaque 23 mm3). TPV is mild.   3. Coronary  calcium score of 0.   4. Normal coronary origins with right dominance. Past Medical History:  Diagnosis Date   Arthritis    deg disc disease  - lower back, neck   Asthma    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, behind right eye   Coronary atherosclerosis due to lipid rich plaque 05/27/2023   CCTA 05/26/2023: Calcium score 0, minimal nonobstructive  CAD [possible minimal plaque at distal aspect of very short left main <25, minimal plaque at ostial LAD <25]; TPV 23 mm (31st percentile)-noncalcific plaque 23 mm    Disorder of vocal cord    Dyspnea    Factor V deficiency (HCC)    Fibromyalgia    GERD (gastroesophageal reflux disease)    Heart murmur    Hx of adenomatous polyp of colon 02/04/2018   Hyperlipidemia    Hypertension    Hypothyroidism    goiter   NAFLD (nonalcoholic fatty liver disease)    Neuromuscular disorder (HCC)    sciatic nerve   PONV (postoperative nausea and vomiting)    Pre-diabetes    Rectocele 01/27/2018   Skin cancer    SVD (spontaneous vaginal delivery)    x 2    Past Surgical History:  Procedure Laterality Date   ABLATION     ANTERIOR (CYSTOCELE) AND POSTERIOR REPAIR (RECTOCELE) WITH XENFORM GRAFT AND SACROSPINOUS FIXATION  09/10/2020   APPENDECTOMY     BASAL CELL CARCINOMA EXCISION  08/2023   left leg   BILATERAL SALPINGOOPHORECTOMY  09/10/2020   BOTOX  INJECTION N/A 04/30/2023   Procedure: BOTOX  INJECTION;  Surgeon: Elisabeth Valli BIRCH, MD;  Location[facility];  Service: Urology;  Laterality: N/A;  30 MINUTES NEEDED   BREAST EXCISIONAL BIOPSY Left    BREAST SURGERY     left- lumpectomy   CYSTOSCOPY N/A 04/30/2023   Procedure: CYSTOSCOPY;  Surgeon: Elisabeth Valli BIRCH, MD;  Location[facility];  Service: Urology;  Laterality: N/A;   DILATATION & CURRETTAGE/HYSTEROSCOPY WITH RESECTOCOPE N/A 04/05/2012   Procedure: hysteroscopy with endocervical curretting;  Surgeon: Donna Just, DO;  Location: [facility];  Service: Gynecology;  Laterality: N/A;   DILATION AND CURETTAGE OF UTERUS     EYE SURGERY Bilateral 10/2017   cataract extraction    LIVER BIOPSY  09/2018   NOSE SURGERY     VAGINAL HYSTERECTOMY  09/10/2020    MEDICATIONS:  acetaminophen  (TYLENOL ) 500 MG tablet   albuterol  (VENTOLIN  HFA) 108 (90 Base) MCG/ACT inhaler   Ascorbic Acid (VITAMIN C) 1000 MG tablet   aspirin EC 81 MG tablet    Biotin 10000 MCG TABS   calcium carbonate (TUMS - DOSED IN MG ELEMENTAL CALCIUM) 500 MG chewable tablet   Cholecalciferol (VITAMIN D ) 50 MCG (2000 UT) tablet   CRANBERRY PO   Cyanocobalamin  (B-12) 2500 MCG SUBL   dexlansoprazole  (DEXILANT ) 60 MG capsule   dicyclomine  (BENTYL ) 20 MG tablet   ezetimibe  (ZETIA ) 10 MG tablet   famotidine  (PEPCID ) 20 MG tablet   fluorouracil (EFUDEX) 5 % cream   furosemide  (LASIX ) 20 MG tablet   hydrocortisone  2.5 % cream   hydroquinone 4 % cream   levothyroxine (SYNTHROID) 75 MCG tablet   loratadine (CLARITIN) 10 MG tablet   MAGNESIUM  MALATE PO   methocarbamol  (ROBAXIN ) 500 MG tablet   metoprolol  succinate (TOPROL -XL) 50 MG 24 hr tablet   metoprolol  tartrate (LOPRESSOR ) 50 MG tablet   mupirocin ointment (BACTROBAN) 2 %   naproxen sodium (ALEVE) 220 MG tablet   Polyethyl Glycol-Propyl Glycol (SYSTANE OP)  scopolamine  (TRANSDERM-SCOP) 1 MG/3DAYS   sodium chloride  (OCEAN) 0.65 % SOLN nasal spray   tirzepatide (ZEPBOUND) 12.5 MG/0.5ML Pen   tretinoin (RETIN-A) 0.025 % cream   triamcinolone  ointment (KENALOG ) 0.1 %   VITAMIN A PO   Zinc 30 MG CAPS   No current facility-administered medications for this encounter.   Burnard CHRISTELLA Odis DEVONNA [facility] Short Stay/Anesthesiology [facility] Phone [phone] 01/08/2024 9:04 AM

## 2024-01-09 LAB — URINE CULTURE: Culture: NO GROWTH

## 2024-01-18 NOTE — Progress Notes (Signed)
 [facility]  PROCEDURAL EXPEDITER PROGRESS NOTE  Patient Name: Tracey Rodriguez  DOB:[birth year] Date of Admission: (Not on file)  Date of Assessment:01/18/24   -------------------------------------------------------------------------------------------------------------------   Brief clinical summary: 63 yr old female with Hx of HTN, pre diabetic, GERD, asthma, chronic pain, and former smoker. Having surgery on 01/19/2024, cysto with botox  injection in bladder neck and wall  Orders in place:  Yes   Communication with surgical team if no orders: n/a  Labs, test, and orders reviewed: yes  Requires surgical clearance:  no  What type of clearance: n/a  Clearance received:   Barriers noted: cardiac work up on echo 06/10/2023  Intervention provided by [facility] team: n/a  Barrier resolved:  yes   -------------------------------------------------------------------------------------------------------------------  [name], Tracey Rodriguez Please contact us  directly via secure chat (search for [facility]) or by calling us  at [phone] [facility]).

## 2024-01-19 ENCOUNTER — Other Ambulatory Visit: Payer: Self-pay

## 2024-01-19 ENCOUNTER — Encounter (HOSPITAL_COMMUNITY): Payer: Self-pay | Admitting: Urology

## 2024-01-19 ENCOUNTER — Encounter (HOSPITAL_COMMUNITY)
Admission: RE | Disposition: A | Discharge status: Home / Self Care | Payer: Self-pay | Source: Home / Self Care | Attending: Urology

## 2024-01-19 ENCOUNTER — Ambulatory Visit (HOSPITAL_COMMUNITY): Admitting: Anesthesiology

## 2024-01-19 ENCOUNTER — Ambulatory Visit (HOSPITAL_COMMUNITY): Admission: RE | Admit: 2024-01-19 | Discharge: 2024-01-19 | Disposition: A | Attending: Urology | Admitting: Urology

## 2024-01-19 ENCOUNTER — Ambulatory Visit (HOSPITAL_COMMUNITY): Payer: Self-pay | Admitting: Physician Assistant

## 2024-01-19 DIAGNOSIS — N3941 Urge incontinence: Secondary | ICD-10-CM

## 2024-01-19 HISTORY — PX: CYSTOSCOPY WITH INJECTION: SHX1424

## 2024-01-19 LAB — URINALYSIS, ROUTINE W REFLEX MICROSCOPIC
Bilirubin Urine: NEGATIVE
Glucose, UA: NEGATIVE mg/dL
Hgb urine dipstick: NEGATIVE
Ketones, ur: NEGATIVE mg/dL
Leukocytes,Ua: NEGATIVE
Nitrite: NEGATIVE
Protein, ur: NEGATIVE mg/dL
Specific Gravity, Urine: 1.015 (ref 1.005–1.030)
pH: 6 (ref 5.0–8.0)

## 2024-01-19 SURGERY — CYSTOSCOPY, WITH INJECTION OF BLADDER NECK OR BLADDER WALL
Anesthesia: Monitor Anesthesia Care

## 2024-01-19 MED ORDER — CEFAZOLIN SODIUM-DEXTROSE 2-4 GM/100ML-% IV SOLN
2.0000 g | INTRAVENOUS | Status: AC
  Administered 2024-01-19: 2 g via INTRAVENOUS
  Filled 2024-01-19: qty 100

## 2024-01-19 MED ORDER — MIDAZOLAM HCL 5 MG/5ML IJ SOLN
INTRAMUSCULAR | Status: DC | PRN
  Administered 2024-01-19: 2 mg via INTRAVENOUS

## 2024-01-19 MED ORDER — ORAL CARE MOUTH RINSE: 15.0000 mL | Freq: Once | OROMUCOSAL | Status: AC

## 2024-01-19 MED ORDER — LACTATED RINGERS IV SOLN: INTRAVENOUS | Status: DC

## 2024-01-19 MED ORDER — PROPOFOL 500 MG/50ML IV EMUL
INTRAVENOUS | Status: DC | PRN
  Administered 2024-01-19: 115 ug/kg/min via INTRAVENOUS
  Administered 2024-01-19: 30 mg via INTRAVENOUS

## 2024-01-19 MED ORDER — ONABOTULINUMTOXINA 100 UNITS IJ SOLR
INTRAMUSCULAR | Status: DC | PRN
  Administered 2024-01-19: 100 [IU] via INTRAMUSCULAR

## 2024-01-19 MED ORDER — MIDAZOLAM HCL 2 MG/2ML IJ SOLN
INTRAMUSCULAR | Status: AC
  Filled 2024-01-19: qty 2

## 2024-01-19 MED ORDER — CEPHALEXIN 500 MG PO CAPS: 500.0000 mg | ORAL_CAPSULE | Freq: Every day | ORAL | 0 refills | Status: AC

## 2024-01-19 MED ORDER — ONDANSETRON HCL 4 MG/2ML IJ SOLN: 4.0000 mg | Freq: Once | INTRAMUSCULAR | Status: DC | PRN

## 2024-01-19 MED ORDER — AMISULPRIDE (ANTIEMETIC) 5 MG/2ML IV SOLN: 10.0000 mg | Freq: Once | INTRAVENOUS | Status: DC | PRN

## 2024-01-19 MED ORDER — CHLORHEXIDINE GLUCONATE 0.12 % MT SOLN
15.0000 mL | Freq: Once | OROMUCOSAL | Status: AC
  Administered 2024-01-19: 15 mL via OROMUCOSAL

## 2024-01-19 MED ORDER — FENTANYL CITRATE (PF) 100 MCG/2ML IJ SOLN
INTRAMUSCULAR | Status: AC
  Filled 2024-01-19: qty 2

## 2024-01-19 MED ORDER — OXYCODONE HCL 5 MG PO TABS: 5.0000 mg | ORAL_TABLET | Freq: Once | ORAL | Status: DC | PRN

## 2024-01-19 MED ORDER — LIDOCAINE HCL (CARDIAC) PF 100 MG/5ML IV SOSY
PREFILLED_SYRINGE | INTRAVENOUS | Status: DC | PRN
  Administered 2024-01-19: 65 mg via INTRAVENOUS

## 2024-01-19 MED ORDER — ONABOTULINUMTOXINA 100 UNITS IJ SOLR
INTRAMUSCULAR | Status: AC
  Filled 2024-01-19: qty 200

## 2024-01-19 MED ORDER — FENTANYL CITRATE (PF) 100 MCG/2ML IJ SOLN
INTRAMUSCULAR | Status: DC | PRN
  Administered 2024-01-19: 50 ug via INTRAVENOUS

## 2024-01-19 MED ORDER — SODIUM CHLORIDE (PF) 0.9 % IJ SOLN
INTRAMUSCULAR | Status: DC | PRN
  Administered 2024-01-19: 10 mL via INTRAVENOUS

## 2024-01-19 MED ORDER — SODIUM CHLORIDE (PF) 0.9 % IJ SOLN
INTRAMUSCULAR | Status: AC
  Filled 2024-01-19: qty 10

## 2024-01-19 MED ORDER — ONDANSETRON HCL 4 MG/2ML IJ SOLN
INTRAMUSCULAR | Status: DC | PRN
  Administered 2024-01-19: 4 mg via INTRAVENOUS

## 2024-01-19 MED ORDER — ACETAMINOPHEN 500 MG PO TABS
1000.0000 mg | ORAL_TABLET | Freq: Once | ORAL | Status: AC
  Administered 2024-01-19: 1000 mg via ORAL
  Filled 2024-01-19: qty 2

## 2024-01-19 MED ORDER — FENTANYL CITRATE (PF) 50 MCG/ML IJ SOSY: 25.0000 ug | PREFILLED_SYRINGE | INTRAMUSCULAR | Status: DC | PRN

## 2024-01-19 MED ORDER — OXYCODONE HCL 5 MG/5ML PO SOLN: 5.0000 mg | Freq: Once | ORAL | Status: DC | PRN

## 2024-01-19 MED ORDER — PROPOFOL 10 MG/ML IV BOLUS
INTRAVENOUS | Status: AC
  Filled 2024-01-19: qty 20

## 2024-01-19 SURGICAL SUPPLY — 15 items
BAG URO CATCHER STRL LF (MISCELLANEOUS) ×2 IMPLANT
CLOTH BEACON ORANGE TIMEOUT ST (SAFETY) ×2 IMPLANT
ELECT REM PT RETURN 15FT ADLT (MISCELLANEOUS) ×2 IMPLANT
GLOVE BIO SURGEON STRL SZ 6.5 (GLOVE) ×2 IMPLANT
GOWN STRL REUS W/ TWL LRG LVL3 (GOWN DISPOSABLE) ×2 IMPLANT
KIT TURNOVER KIT A (KITS) ×2 IMPLANT
MANIFOLD NEPTUNE II (INSTRUMENTS) ×2 IMPLANT
NDL ASPIRATION 22 (NEEDLE) ×2 IMPLANT
NDL SAFETY ECLIPSE 18X1.5 (NEEDLE) ×2 IMPLANT
PACK CYSTO (CUSTOM PROCEDURE TRAY) ×2 IMPLANT
SYR 20ML LL LF (SYRINGE) ×2 IMPLANT
SYR CONTROL 10ML LL (SYRINGE) ×2 IMPLANT
TUBING CONNECTING 10 (TUBING) ×2 IMPLANT
TUBING UROLOGY SET (TUBING) ×2 IMPLANT
WATER STERILE IRR 3000ML UROMA (IV SOLUTION) ×2 IMPLANT

## 2024-01-19 NOTE — Op Note (Signed)
 Operative Note  Preoperative diagnosis:  1.  Urinary urge incontinence  Postoperative diagnosis: 1.  Urinary urge incontinence  Procedure(s): 1.  Cystoscopy with Botox  100 units  Surgeon: Valli Shank, MD  Assistants:  None  Anesthesia: MAC  Complications:  None  EBL:  none  Specimens: 1. none  Drains/Catheters: 1.  none  Intraoperative findings:   Normal urethra Orthotopic bilateral UOS Normal bladder mucosa with moderate trabeculations   Indication:  Tracey Rodriguez is a 62 y.o. female with urinary urgency, frequency and urge incontinence managed with Botox .  Description of procedure:  After risks and benefits were discussed, informed consent was obtained.  Patient taken the operating placed in the supine position.  Anesthesia induced and antibiotics administered.  She was then repositioned in dorsolithotomy position.  She was prepped and draped in the usual sterile fashion and timeout is performed.  The injection cystoscope was placed in the meatus and advanced into the bladder under direct visualization.  100 units of Botox  mixed in 10 cc of sterile saline were then injected in standard template the posterior wall the bladder and 1 cc increments.  The scope was removed.  The patient emerged from anesthesia and was transferred the PACU in stable condition.  Plan: Follow-up in the office 2 weeks

## 2024-01-19 NOTE — H&P (Signed)
 CC/HPI: cc: urinary frequency/urgency   07/24/21: 63 year old woman with a history of a negative gross hematuria work-up in 2018 also with overactive bladder. 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 She underwent hysterectomy with Dr. Alvia at [facility] in September 2022. She is using vaginal estradiol cream but feels like the vaginal vault is very tight. She continues to have stress incontinence and wears a pad which she changes twice a day. She also has a urinary urge incontinence. She drinks 1 cup of coffee in the morning and nothing else during the day. She recently went on a cruise and drink 4 glasses of water at dinner and was then voiding twice overnight. She is not thirsty and this is the reason for not drinking during the day. The [name] helps her go from voiding every 10 to 15 minutes to being able to make it to a bathroom on car rides.   Therapy she has tried: Myrbetriq, peripheral tibial nerve stimulation  Current therapy: Gemtesa  Therapy she is not interested in: Botox , InterStim   12/11/2021: 63 year old woman with a history of urinary frequency and urgency managed with Gemtesa here after a 3-week period of discolored urine. Patient states urine was orange/red-colored specially in the morning and evening. It did not change with hydration status. She had no pain associated with it. She came in and gave a urine sample and culture was negative at that time. Urinalysis however was not done so unsure if there is microscopic hematuria. Her urinalysis today is normal.    01/24/22: 63 year old woman with a history of urinary frequency and urgency managed with Gemtesa here for cystoscopy as part of hematuria evaluation. CT scan did not show any source of hematuria. Patient's discolored urine and hematuria has resolved.   05/28/22: 63 year old woman with a history of urinary urgency and frequency manage with Gemtesa who also had a negative hematuria workup in 2023 here  for follow-up. Patient is scheduled for colonoscopy next week. Overall the [name] continues to help her symptoms. Off of medication she was voiding every 10 minutes.   03/28/2023: 63 year old woman with longstanding urinary urgency and frequency refractory to medication here for follow-up. She had a urodynamic study that showed positive instability as well as urge incontinence. She is ready to move forward with Botox .   UDS SUMMARY  Mr. Spiewak held a max capacity of approx. 309 mls. His 1st sensation was felt at 124 mls. No SUI noted. There was positive instability. A couple unstable contractions noted on the study. Some of these contraction were provoked from coughing. She felt an increased urgency but only leaked with one of these bladder spasms. Leakage was severe(251 ml). The spasm forced her pressure line to fall out. He was able to generate a voluntary contraction and void 167 ml with max flow of 18 ml/s. Max detrusor pressure while voiding was 31 cmH20. EMG leads were basically quiet during the voiding phase. PVR was approx. 32 mls. During cough and Valsalva, the bladder descended approximately 1-2 cm. No trabeculation was noted. No reflux was seen. She will return for UDS follow up.   05/14/2023: 63 year old female presents for follow-up of Botox . She underwent Botox  procedure 2 weeks ago. The first week she reports everything was great. However this week she has noticed difficulty starting her stream, needing to use Crede maneuvers in order to initiate it And straining while urinating. She denies fevers and chills. She denies gross hematuria and dysuria.   05/18/2023: 63 year old female who underwent  Botox  ultimately 3 weeks ago and was having difficulty voiding presents today for follow-up. At last office visit she was successful in performing CIC but has had some difficulty performing this over the weekend. She states Saturday was a rough day and she could not urinate at all. She could also not  pass the catheter. She had one of her friends come over who is a designer, jewellery and helped her catheterize. Today however she has been having the urge to urinate and has been able to void. She has voided 3 times in the past 12 hours. She endorses a good stream with the voids. She does not feel like she is fully emptying. Her initial PVR was over 400. She was able to void in the office and second PVR was .   05/21/2023: Tracey Rodriguez is a 63 year old female who presents today for follow-up of cystitis and difficulty voiding post Botox . Since treatment of her infection she has been voiding without complaint and doing very well. She is very pleased with the Botox  procedure and states that she has not had this good of stream or gone this long in between voids in a long time. Her current PVR is 260 mL. She denies pain or discomfort.   09/14/2023: 63 year old woman with a history of urge urinary incontinence s/p Botox  March 2025 here for follow-up. She did have urinary tension following Botox  and had to learn CIC. She was doing well until about 2 weeks ago she developed acute onset urinary frequency, urgency and nocturia. During that time she was on the strong antihistamine and steroids for poison ivy. Urinalysis and culture at the time were negative. Urinalysis remains negative today. She is interested in moving forward with repeat Botox .    01/19/24: Here for botox   ALLERGIES: Etodolac CAPS - Trouble Breathing Statins - Itching Sulfa Drugs - Vomiting VESIcare TABS - Other Reaction, unknown    MEDICATIONS: Doxycycline Hyclate 100 MG Capsule  Metoprolol  Succinate ER 50 MG Tablet Extended Release 24 Hour  Aspir 81 1 tablet PO Daily  Levothyroxine Sodium 50 MCG Oral Tablet Oral  Mupirocin 2 % Ointment External  MVI  NexIUM  40 MG Capsule Delayed Release Oral  Zetia  10 MG Tablet     GU PSH: Complex cystometrogram, w/ void pressure and urethral pressure profile studies, any technique - 04/03/2023 Complex  Uroflow - 04/03/2023 Cystoscopy - 01/24/2022, 2018 D&C Non-OB - 2009 Emg surf Electrd - 04/03/2023 Inject For cystogram - 04/03/2023 Intrabd voidng Press - 04/03/2023 Locm 300-399Mg /Ml Iodine,1Ml - 12/30/2021, 2018       PSH Notes: Abdominal Surgery  Rhinoplasty   NON-GU PSH: Abortion - 2009 Carpal Tunnel Surgery.. - 2016 Neuroeltrd Stim Post Tibial - 2022, 2022, 2022, 2022, 2022, 2022, 2022, 2022, 2022, 2022, 2022, 2022 Reconstruct Nose - 2009 Remove Breast Lesion - 2009 Visit Complexity (formerly GPC1X) - 05/21/2023, 05/18/2023, 05/14/2023, 04/17/2023     GU PMH: Chronic cystitis (w/o hematuria) - 05/21/2023, Chronic cystitis, - 2016 Incomplete bladder emptying - 05/21/2023, - 05/18/2023, - 05/14/2023 Mixed incontinence - 05/14/2023, - 04/03/2023, - 05/28/2022, - 2023, - 2022, - 2022 Overactive bladder - 05/14/2023, - 04/17/2023, - 01/24/2022, - 2023, - 2022, - 2022, - 2022, - 2022, - 2022, - 2022, - 2022, - 2018, Overactive bladder, - 2016 Urge incontinence - 04/17/2023, - 04/03/2023, - 2022, - 2022, - 2022, - 2022, - 2022, - 2022, - 2022, - 2022, - 2022, - 2022, - 2022, - 2022, - 2022, Urge incontinence of urine, - 2016 Urinary Frequency -  04/17/2023, - 05/28/2022, - 2023, - 2022 Urinary Urgency - 05/28/2022, - 2023, - 2022 Gross hematuria - 01/24/2022, - 12/30/2021, - 12/11/2021, - 2018 (Acute), With hx of significant tobacco use in the past will send urine for cytology and proceed with hematuria workup with CT and cysto , - 2018 Benign Neo Bladder - 2018 Straining on Urination, Straining on urination - 2014      PMH Notes:  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  2007-08-24 09:18:20 - Note: Nontoxic Multinodular Goiter  2007-08-24 09:18:20 - Note: Dyslipidemia   NON-GU PMH: Encounter for general adult medical examination without abnormal findings, Encounter for preventive health examination - 2016 Cardiac murmur, unspecified, Murmurs - 2014 Fibromyalgia, Fibromyalgia - 2014 Irritable bowel  syndrome with diarrhea, Irritable Bowel Syndrome - 2014 Muscle weakness (generalized), Muscle weakness - 2014 Other lack of coordination, Other lack of coordination - 2014 Personal history of other benign neoplasm, History of uterine leiomyoma - 2014 Personal history of other diseases of the digestive system, History of esophageal reflux - 2014 Personal history of other specified conditions, History of headache - 2014    FAMILY HISTORY: Acute Myocardial Infarction - Father Breast Cancer - Mother Diabetes - Father, Brother, Mother Father Deceased At [employer] ___ - Runs In Family Hepatitis - Runs In Family Hypertension - Father, Mother, Brother Mother Deceased At Age 33 from diabetic complicati - Runs In Family Murmurs - Mother Pure Hypercholesterolemia - Mother, Brother Renal Disease - Runs In Family Tuberculosis - Father Ulcer, Gastric - Father Venous Thrombosis Of The Deep Vessels Of The Dista - Father   SOCIAL HISTORY: Marital Status: Married Preferred Language: English; Ethnicity: Not Hispanic Or Latino; Race: White Current Smoking Status: Patient does not smoke anymore. Has not smoked since 06/17/1996. Smoked for 15 years. Smoked 3 packs per day.   Tobacco Use Assessment Completed: Used Tobacco in last 30 days? Does not use smokeless tobacco. Social Drinker.  Drinks 2 caffeinated drinks per day.     Notes: 1 son, 1 daughter   REVIEW OF SYSTEMS:    GU Review Female:   Patient denies frequent urination, hard to postpone urination, burning /pain with urination, get up at night to urinate, leakage of urine, stream starts and stops, trouble starting your stream, have to strain to urinate, and being pregnant.  Gastrointestinal (Upper):   Patient denies nausea, vomiting, and indigestion/ heartburn.  Gastrointestinal (Lower):   Patient denies diarrhea and constipation.  Constitutional:   Patient denies fever, night sweats, weight loss, and fatigue.  Skin:   Patient denies skin rash/  lesion and itching.  Eyes:   Patient denies blurred vision and double vision.  Ears/ Nose/ Throat:   Patient denies sore throat and sinus problems.  Hematologic/Lymphatic:   Patient denies swollen glands and easy bruising.  Cardiovascular:   Patient denies leg swelling and chest pains.  Respiratory:   Patient denies cough and shortness of breath.  Endocrine:   Patient denies excessive thirst.  Musculoskeletal:   Patient denies back pain and joint pain.  Neurological:   Patient denies headaches and dizziness.  Psychologic:   Patient denies depression and anxiety.   VITAL SIGNS: None   MULTI-SYSTEM PHYSICAL EXAMINATION:    Constitutional: Well-nourished. No physical deformities. Normally developed. Good grooming.  Neck: Neck symmetrical, not swollen. Normal tracheal position.  Respiratory: No labored breathing, no use of accessory muscles.   Skin: No paleness, no jaundice, no cyanosis. No lesion, no ulcer, no rash.  Neurologic / Psychiatric: Oriented to time, oriented to  place, oriented to person. No depression, no anxiety, no agitation.  Eyes: Normal conjunctivae. Normal eyelids.  Ears, Nose, Mouth, and Throat: Left ear no scars, no lesions, no masses. Right ear no scars, no lesions, no masses. Nose no scars, no lesions, no masses. Normal hearing. Normal lips.  Musculoskeletal: Normal gait and station of head and neck.     Complexity of Data:  Records Review:   Previous Patient Records, POC Tool  Urine Test Review:   Urinalysis   PROCEDURES:          Visit Complexity - G2211          Urinalysis Dipstick Dipstick Cont'd  Color: Yellow Bilirubin: Neg mg/dL  Appearance: Clear Ketones: Neg mg/dL  Specific Gravity: 8.974 Blood: Neg ery/uL  pH: 6.0 Protein: Neg mg/dL  Glucose: Neg mg/dL Urobilinogen: 0.2 mg/dL    Nitrites: Neg    Leukocyte Esterase: Neg leu/uL    ASSESSMENT:      ICD-10 Details  1 GU:   Incomplete bladder emptying - R39.14 Chronic, Stable  2   Urge  incontinence - N39.41 Chronic, Stable  3   Urinary Frequency - R35.0 Chronic, Stable  4   Urinary Urgency - R39.15 Chronic, Stable   PLAN:            Medications New Meds: Cephalexin 500 MG Capsule 1 capsule PO BID start the morning of your bladder procedure  #2  0 Refill(s)  Pharmacy Name:  [id number]  Address:  [address]   [place], [place] [address]  Phone:  [phone]  Fax:  [phone]            Schedule Return Visit/Planned Activity: Next Available Appointment - Botox  100mg           Document Letter(s):  Created for Patient: Clinical Summary         Notes:   1. Urinary urgency/urge incontinence:  - Patient would like to repeat Botox  and we will do this in the office  - she understands the likelihood of urinary retention necessitating CIC and is okay with this  - Her symptoms have worsened with PTNS and will avoid InterStim/eCoin    Botox  100 units in OR

## 2024-01-19 NOTE — Transfer of Care (Signed)
 Immediate Anesthesia Transfer of Care Note  Patient: Tracey Rodriguez  Procedure(s) Performed: Procedure(s) with comments: CYSTOSCOPY, WITH INJECTION OF BLADDER NECK OR BLADDER WALL (N/A) - CYSTOSCOPY WITH BOTOX   Patient Location: PACU  Anesthesia Type:MAC  Level of Consciousness:  sedated, patient cooperative and responds to stimulation  Airway & Oxygen Therapy:Patient Spontanous Breathing and Patient connected to face mask oxgen  Post-op Assessment:  Report given to PACU RN and Post -op Vital signs reviewed and stable  Post vital signs:  Reviewed and stable  Last Vitals:  Vitals:   01/19/24 1229  BP: (!) 172/81  Pulse: 73  Resp: 16  Temp: 36.5 C  SpO2: 99%    Complications: No apparent anesthesia complications

## 2024-01-19 NOTE — Discharge Instructions (Addendum)
 Cystoscopy with Botox  patient instructions  Following a cystoscopy, a catheter (a flexible rubber tube) is sometimes left in place to empty the bladder. This may cause some discomfort or a feeling that you need to urinate. Your doctor determines the period of time that the catheter will be left in place. You may have bloody urine for two to three days (Call your doctor if the amount of bleeding increases or does not subside).  You may pass blood clots in your urine, especially if you had a biopsy. It is not unusual to pass small blood clots and have some bloody urine a couple of weeks after your cystoscopy. Again, call your doctor if the bleeding does not subside. You may have: Dysuria (painful urination) Frequency (urinating often) Urgency (strong desire to urinate)  These symptoms are common especially if medicine is instilled into the bladder or a ureteral stent is placed. Avoiding alcohol and caffeine, such as coffee, tea, and chocolate, may help relieve these symptoms. Drink plenty of water, unless otherwise instructed. Your doctor may also prescribe an antibiotic or other medicine to reduce these symptoms.  Cystoscopy results are available soon after the procedure; biopsy results usually take two to four days. Your doctor will discuss the results of your exam with you. Before you go home, you will be given specific instructions for follow-up care. Special Instructions:   If you are going home with a catheter in place do not take a tub bath until removed by your doctor.   You may resume your normal activities.   Do not drive or operate machinery if you are taking narcotic pain medicine.   Be sure to keep all follow-up appointments with your doctor.   Call Your Doctor If: The catheter is not draining You have severe pain You are unable to urinate You have a fever over 101 You have severe bleeding         **If you are unable to empty your bladder, please call our office.  If you have  catheters at home in November had a do CIC you can also self catheterize.

## 2024-01-20 ENCOUNTER — Encounter (HOSPITAL_COMMUNITY): Payer: Self-pay | Admitting: Urology

## 2024-01-20 NOTE — Anesthesia Postprocedure Evaluation (Signed)
 Anesthesia Post Note  Patient: Tracey Rodriguez  Procedure(s) Performed: CYSTOSCOPY, WITH INJECTION OF BLADDER NECK OR BLADDER WALL     Patient location during evaluation: PACU Anesthesia Type: MAC Level of consciousness: awake and alert Pain management: pain level controlled Vital Signs Assessment: post-procedure vital signs reviewed and stable Respiratory status: spontaneous breathing, nonlabored ventilation and respiratory function stable Cardiovascular status: blood pressure returned to baseline and stable Postop Assessment: no apparent nausea or vomiting Anesthetic complications: no   No notable events documented.  Last Vitals:  Vitals:   01/19/24 1530 01/19/24 1545  BP: (!) 145/74 (!) 143/81  Pulse: 64 66  Resp: 11 16  Temp:    SpO2: 100% 100%    Last Pain:  Vitals:   01/19/24 1545  TempSrc:   PainSc: 0-No pain                 [name]

## 2024-02-13 ENCOUNTER — Other Ambulatory Visit: Payer: Self-pay | Admitting: Internal Medicine

## 2024-02-13 DIAGNOSIS — K219 Gastro-esophageal reflux disease without esophagitis: Secondary | ICD-10-CM

## 2024-02-26 ENCOUNTER — Encounter: Attending: Physical Medicine & Rehabilitation | Admitting: Physical Medicine & Rehabilitation

## 2024-02-26 ENCOUNTER — Encounter: Payer: Self-pay | Admitting: Physical Medicine & Rehabilitation

## 2024-02-26 VITALS — BP 143/85 | HR 86 | Ht 65.5 in | Wt 164.6 lb

## 2024-02-26 DIAGNOSIS — M533 Sacrococcygeal disorders, not elsewhere classified: Secondary | ICD-10-CM | POA: Diagnosis not present

## 2024-02-26 NOTE — Progress Notes (Signed)
 "  Subjective:    Patient ID: Tracey Rodriguez, female    DOB: 1960-06-09, 64 y.o.   MRN: [id number]  HPI  Discussed the use of AI scribe software for clinical note transcription with the patient, who gave verbal consent to proceed.  History of Present Illness Tracey Rodriguez is a 64 year old female with chronic bilateral sacroiliac joint pain who presents for follow-up and consideration of repeat sacroiliac joint injections.  She has persistent bilateral sacroiliac joint pain, previously managed with sacroiliac joint injections in October 2025. The injections provided significant relief for approximately three months, after which the pain recurred.  Over the past few days, she has experienced worsening pain, particularly with ambulation and stair climbing, describing difficulty ascending steps, especially yesterday. She describes pain in her hips and sacroiliac region, and has noted that it can go down to her knees. She notes generalized soreness and tightness, with pain exacerbated by specific movements and maneuvers.  She has not resumed regular exercise or stretching for her back due to ongoing efforts to settle into a new house and recent illness. Physical therapy sessions were previously canceled due to COVID-19 exposure or illness, but she plans to resume therapy next Wednesday.   Pain Inventory Average Pain 10 Pain Right Now 7 My pain is sharp, stabbing, and aching  In the last 24 hours, has pain interfered with the following? General activity 10 Relation with others 0 Enjoyment of life 0 What TIME of day is your pain at its worst? morning  and evening Sleep (in general) Fair  Pain is worse with: bending, sitting, standing, and some activites Pain improves with: therapy/exercise and injections Relief from Meds: 1  Family History  Problem Relation Age of Onset   Breast cancer Mother 58   Ovarian cancer Mother    Heart disease Father 78   Heart attack Father     Clotting disorder Father    Rheumatologic disease Father    Crohn's disease Father    Esophageal cancer Brother    Heart attack Brother    Cancer Brother    Lung cancer Brother    Colon polyps Brother    Throat cancer Brother    Colon polyps Brother    Stroke Brother    Breast cancer Maternal Aunt 60   Esophageal cancer Maternal Uncle    Stroke Paternal Aunt    Heart disease Paternal Aunt    Kidney disease Paternal Uncle    Liver cancer Maternal Grandmother    Heart disease Maternal Grandfather    Lung cancer Maternal Grandfather    Irritable bowel syndrome Son    Colon cancer Neg Hx    Rectal cancer Neg Hx    Stomach cancer Neg Hx    Social History   Socioeconomic History   Marital status: Legally Separated    Spouse name: Not on file   Number of children: 2   Years of education: Not on file   Highest education level: Not on file  Occupational History   Not on file  Tobacco Use   Smoking status: Former    Current packs/day: 0.00    Average packs/day: 3.0 packs/day for 22.0 years (66.0 ttl pk-yrs)    Types: Cigarettes    Start date: 02/17/1978    Quit date: 02/18/2000    Years since quitting: 24.0    Passive exposure: Never   Smokeless tobacco: Never  Vaping Use   Vaping status: Never Used  Substance and Sexual Activity  Alcohol use: Yes    Alcohol/week: 0.0 standard drinks of alcohol    Comment: RARELY   Drug use: Never   Sexual activity: Yes    Birth control/protection: Post-menopausal  Other Topics Concern   Not on file  Social History Narrative   She is married, has children.  They are grown.  1 son and 1 daughter has at least 1 grandchild   Former smoker no significant alcohol no drug use   Social Drivers of Health   Tobacco Use: Medium Risk (02/26/2024)   Patient History    Smoking Tobacco Use: Former    Smokeless Tobacco Use: Never    Passive Exposure: Never  Physicist, Medical Strain: Not on file  Food Insecurity: No Food Insecurity (03/05/2021)    Received from [facility]   Epic    Within the past 12 months, you worried that your food would run out before you got the money to buy more.: Never true    Within the past 12 months, the food you bought just didn't last and you didn't have money to get more.: Never true  Transportation Needs: Not on file  Physical Activity: Not on file  Stress: Not on file  Social Connections: Unknown (06/25/2021)   [facility]   Social Network    Social Network: Not on file  Depression (PHQ2-9): Low Risk (11/26/2023)   Depression (PHQ2-9)    PHQ-2 Score: 0  Alcohol Screen: Not on file  Housing: Not on file  Utilities: Not on file  Health Literacy: Not on file   Past Surgical History:  Procedure Laterality Date   ABLATION     ANTERIOR (CYSTOCELE) AND POSTERIOR REPAIR (RECTOCELE) WITH XENFORM GRAFT AND SACROSPINOUS FIXATION  09/10/2020   APPENDECTOMY     BASAL CELL CARCINOMA EXCISION  08/2023   left leg   BILATERAL SALPINGOOPHORECTOMY  09/10/2020   BOTOX  INJECTION N/A 04/30/2023   Procedure: BOTOX  INJECTION;  Surgeon: Elisabeth Valli BIRCH, MD;  Location: [facility];  Service: Urology;  Laterality: N/A;  30 MINUTES NEEDED   BREAST EXCISIONAL BIOPSY Left    BREAST SURGERY     left- lumpectomy   CYSTOSCOPY N/A 04/30/2023   Procedure: CYSTOSCOPY;  Surgeon: Elisabeth Valli BIRCH, MD;  Location: [facility];  Service: Urology;  Laterality: N/A;   CYSTOSCOPY WITH INJECTION N/A 01/19/2024   Procedure: CYSTOSCOPY, WITH INJECTION OF BLADDER NECK OR BLADDER WALL;  Surgeon: Elisabeth Valli BIRCH, MD;  Location: [facility];  Service: Urology;  Laterality: N/A;  CYSTOSCOPY WITH BOTOX    DILATATION & CURRETTAGE/HYSTEROSCOPY WITH RESECTOCOPE N/A 04/05/2012   Procedure: hysteroscopy with endocervical curretting;  Surgeon: Donna Just, DO;  Location: [facility];  Service: Gynecology;  Laterality: N/A;   DILATION AND CURETTAGE OF UTERUS     EYE SURGERY Bilateral 10/2017   cataract extraction    LIVER BIOPSY  09/2018    NOSE SURGERY     VAGINAL HYSTERECTOMY  09/10/2020   Past Surgical History:  Procedure Laterality Date   ABLATION     ANTERIOR (CYSTOCELE) AND POSTERIOR REPAIR (RECTOCELE) WITH XENFORM GRAFT AND SACROSPINOUS FIXATION  09/10/2020   APPENDECTOMY     BASAL CELL CARCINOMA EXCISION  08/2023   left leg   BILATERAL SALPINGOOPHORECTOMY  09/10/2020   BOTOX  INJECTION N/A 04/30/2023   Procedure: BOTOX  INJECTION;  Surgeon: Elisabeth Valli BIRCH, MD;  Location: [facility];  Service: Urology;  Laterality: N/A;  30 MINUTES NEEDED   BREAST EXCISIONAL BIOPSY Left    BREAST SURGERY  left- lumpectomy   CYSTOSCOPY N/A 04/30/2023   Procedure: CYSTOSCOPY;  Surgeon: Elisabeth Valli BIRCH, MD;  Location: [facility];  Service: Urology;  Laterality: N/A;   CYSTOSCOPY WITH INJECTION N/A 01/19/2024   Procedure: CYSTOSCOPY, WITH INJECTION OF BLADDER NECK OR BLADDER WALL;  Surgeon: Elisabeth Valli BIRCH, MD;  Location: [facility];  Service: Urology;  Laterality: N/A;  CYSTOSCOPY WITH BOTOX    DILATATION & CURRETTAGE/HYSTEROSCOPY WITH RESECTOCOPE N/A 04/05/2012   Procedure: hysteroscopy with endocervical curretting;  Surgeon: Donna Just, DO;  Location: [facility];  Service: Gynecology;  Laterality: N/A;   DILATION AND CURETTAGE OF UTERUS     EYE SURGERY Bilateral 10/2017   cataract extraction    LIVER BIOPSY  09/2018   NOSE SURGERY     VAGINAL HYSTERECTOMY  09/10/2020   Past Medical History:  Diagnosis Date   Arthritis    deg disc disease  - lower back, neck   Asthma    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, behind right eye   Coronary atherosclerosis due to lipid rich plaque 05/27/2023   CCTA 05/26/2023: Calcium score 0, minimal nonobstructive CAD [possible minimal plaque at distal aspect of very short left main <25, minimal plaque at ostial LAD <25]; TPV 23 mm (31st percentile)-noncalcific plaque 23 mm    Disorder of vocal cord    Dyspnea    Factor V deficiency (HCC)    Fibromyalgia    GERD (gastroesophageal reflux disease)     Heart murmur    Hx of adenomatous polyp of colon 02/04/2018   Hyperlipidemia    Hypertension    Hypothyroidism    goiter   NAFLD (nonalcoholic fatty liver disease)    Neuromuscular disorder (HCC)    sciatic nerve   PONV (postoperative nausea and vomiting)    Pre-diabetes    Rectocele 01/27/2018   Skin cancer    SVD (spontaneous vaginal delivery)    x 2   BP (!) 143/85   Pulse 86   Ht 5' 5.5 (1.664 m)   Wt 164 lb 9.6 oz (74.7 kg)   SpO2 98%   BMI 26.97 kg/m   Opioid Risk Score:   Fall Risk Score:  `1  Depression screen [facility] 2/9     02/26/2024   10:44 AM 11/26/2023   10:55 AM 10/20/2023    9:41 AM 08/07/2023   10:11 AM 12/16/2022    2:02 PM 11/28/2022    1:39 PM 08/29/2022    1:54 PM  Depression screen PHQ 2/9  Decreased Interest 0 0 0 0 0 0 0  Down, Depressed, Hopeless 0 0 0 0 0 0 0  PHQ - 2 Score 0 0 0 0 0 0 0      Review of Systems  Musculoskeletal:  Positive for back pain and neck pain.  All other systems reviewed and are negative.      Objective:   Physical Exam  General no acute distress Mood and affect appropriate Extremities without edema Lumbar spine has mild tenderness palpation lumbar paraspinals around L4 and L5. Negative straight leg raising test Ambulates without assistive device no evidence of toe drag or knee instability  Sacral thrust (prone) : Positive bilaterally Lateral compression: Negative FABER's: Positive left Distraction (supine): Negative Thigh thrust test: Positive bilaterally      Assessment & Plan:   Assessment and Plan Assessment & Plan Sacroiliac joint pain Chronic bilateral sacroiliac joint pain with significant functional impairment, previously responsive to injections. Symptoms recurred and worsened, limiting ambulation and stair climbing. -  Planned repeat bilateral sacroiliac joint injections next week, prior to insurance deductible reset.  Low back and buttocks pain Chronic low back and buttocks pain overlapping  with sacroiliac joint pain, recently worsened. Functional limitations exacerbated by inability to attend physical therapy. - Encouraged resumption of physical therapy; next session scheduled for Wednesday.   "

## 2024-02-26 NOTE — Patient Instructions (Signed)
" °  VISIT SUMMARY: During your visit, we discussed your chronic bilateral sacroiliac joint pain and low back and buttocks pain. We planned for repeat sacroiliac joint injections and encouraged resumption of physical therapy.  YOUR PLAN: SACROILIAC JOINT PAIN: You have chronic bilateral sacroiliac joint pain that has worsened recently, limiting your ability to walk and climb stairs. -We will proceed with repeat bilateral sacroiliac joint injections next week before your insurance deductible resets.  LOW BACK AND BUTTOCKS PAIN: You have chronic low back and buttocks pain that overlaps with your sacroiliac joint pain and has recently worsened. -Please resume physical therapy as planned. Your next session is scheduled for Wednesday.                      Contains text generated by Abridge.                                 Contains text generated by Abridge.   "

## 2024-03-17 NOTE — Progress Notes (Unsigned)
 "  Office Visit Note  Patient: Tracey Rodriguez             Date of Birth: 01-09-1961           MRN: [id number]             PCP: Frederik Charleston, MD Referring: Frederik Charleston, MD Visit Date: 03/31/2024 Occupation: Data Unavailable  Subjective:  No chief complaint on file.   History of Present Illness: Tracey Rodriguez is a 64 y.o. female ***     Activities of Daily Living:  Patient reports morning stiffness for *** {minute/hour:19697}.   Patient {ACTIONS;DENIES/REPORTS:21021675::Denies} nocturnal pain.  Difficulty dressing/grooming: {ACTIONS;DENIES/REPORTS:21021675::Denies} Difficulty climbing stairs: {ACTIONS;DENIES/REPORTS:21021675::Denies} Difficulty getting out of chair: {ACTIONS;DENIES/REPORTS:21021675::Denies} Difficulty using hands for taps, buttons, cutlery, and/or writing: {ACTIONS;DENIES/REPORTS:21021675::Denies}  No Rheumatology ROS completed.   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   Vocal cord dysfunction 05/21/2020   Chronic cough 04/19/2020   Sensorineural hearing loss (SNHL), bilateral 04/04/2020   Osteopenia 12/01/2019   NAFLD (nonalcoholic fatty liver disease) 91/87/7978   Obesity (BMI 30.0-34.9) 09/29/2019   Hyperlipidemia 09/09/2018   Prediabetes 09/09/2018   Hx of adenomatous polyp of colon 02/04/2018   Rectocele 01/27/2018   Change in bowel habits 01/05/2018   Hemorrhoids 01/05/2018   Hoarseness 07/30/2017   Gastroesophageal reflux disease 07/30/2017   Pure hypercholesterolemia 06/03/2017   History of breast cancer 09/11/2016   Other fatigue 03/15/2016   Primary insomnia 03/15/2016   Primary osteoarthritis of both hands 03/15/2016   DDD cervical spine 03/15/2016   Osteoarthritis of lumbar spine  03/15/2016   History of bilateral carpal tunnel release 03/15/2016   Hypothyroidism 03/30/2013   Fibromyalgia 03/30/2013   Breast cancer (HCC) 03/30/2013   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   Dyspnea 02/02/2013   Restrictive lung disease 02/02/2013    Past Medical History:  Diagnosis Date   Arthritis    deg disc disease  - lower back, neck   Asthma    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, behind right eye   Coronary atherosclerosis due to lipid rich plaque 05/27/2023   CCTA 05/26/2023: Calcium score 0, minimal nonobstructive CAD [possible minimal plaque at distal aspect of very short left main <25, minimal plaque at ostial LAD <25]; TPV 23 mm (31st percentile)-noncalcific plaque 23 mm    Disorder of vocal cord    Dyspnea    Factor V deficiency (HCC)    Fibromyalgia    GERD (gastroesophageal reflux disease)    Heart murmur    Hx of adenomatous polyp of colon 02/04/2018   Hyperlipidemia    Hypertension    Hypothyroidism    goiter   NAFLD (nonalcoholic fatty liver disease)    Neuromuscular disorder (HCC)    sciatic nerve   PONV (postoperative nausea and vomiting)    Pre-diabetes    Rectocele 01/27/2018   Skin cancer    SVD (spontaneous vaginal delivery)    x 2    Family History  Problem Relation Age of Onset   Breast cancer Mother 19   Ovarian cancer Mother    Heart disease Father 43   Heart attack Father    Clotting disorder Father    Rheumatologic disease Father    Crohn's disease Father    Esophageal cancer Brother  Heart attack Brother    Cancer Brother    Lung cancer Brother    Colon polyps Brother    Throat cancer Brother    Colon polyps Brother    Stroke Brother    Breast cancer Maternal Aunt 60   Esophageal cancer Maternal Uncle    Stroke Paternal Aunt    Heart disease Paternal Aunt    Kidney disease Paternal Uncle    Liver cancer Maternal Grandmother    Heart disease Maternal Grandfather    Lung cancer Maternal Grandfather    Irritable bowel  syndrome Son    Colon cancer Neg Hx    Rectal cancer Neg Hx    Stomach cancer Neg Hx    Past Surgical History:  Procedure Laterality Date   ABLATION     ANTERIOR (CYSTOCELE) AND POSTERIOR REPAIR (RECTOCELE) WITH XENFORM GRAFT AND SACROSPINOUS FIXATION  09/10/2020   APPENDECTOMY     BASAL CELL CARCINOMA EXCISION  08/2023   left leg   BILATERAL SALPINGOOPHORECTOMY  09/10/2020   BOTOX  INJECTION N/A 04/30/2023   Procedure: BOTOX  INJECTION;  Surgeon: Elisabeth Valli BIRCH, MD;  Location: [facility];  Service: Urology;  Laterality: N/A;  30 MINUTES NEEDED   BREAST EXCISIONAL BIOPSY Left    BREAST SURGERY     left- lumpectomy   CYSTOSCOPY N/A 04/30/2023   Procedure: CYSTOSCOPY;  Surgeon: Elisabeth Valli BIRCH, MD;  Location: [facility];  Service: Urology;  Laterality: N/A;   CYSTOSCOPY WITH INJECTION N/A 01/19/2024   Procedure: CYSTOSCOPY, WITH INJECTION OF BLADDER NECK OR BLADDER WALL;  Surgeon: Elisabeth Valli BIRCH, MD;  Location: [facility];  Service: Urology;  Laterality: N/A;  CYSTOSCOPY WITH BOTOX    DILATATION & CURRETTAGE/HYSTEROSCOPY WITH RESECTOCOPE N/A 04/05/2012   Procedure: hysteroscopy with endocervical curretting;  Surgeon: Donna Just, DO;  Location: [facility];  Service: Gynecology;  Laterality: N/A;   DILATION AND CURETTAGE OF UTERUS     EYE SURGERY Bilateral 10/2017   cataract extraction    LIVER BIOPSY  09/2018   NOSE SURGERY     VAGINAL HYSTERECTOMY  09/10/2020   Social History[1] Social History   Social History Narrative   She is married, has children.  They are grown.  1 son and 1 daughter has at least 1 grandchild   Former smoker no significant alcohol no drug use     Immunization History  Administered Date(s) Administered   Fluzone Influenza virus vaccine,trivalent (IIV3), split virus 12/10/2017   Hepatitis A, Adult 04/20/2014, 10/25/2014   Influenza Split 11/14/2008, 03/04/2011, 03/16/2012, 11/17/2012   Influenza,inj,Quad PF,6+ Mos 12/19/2016, 01/05/2018, 11/19/2018, 02/01/2020    Influenza,inj,quad, With Preservative 03/07/2014   Influenza-Unspecified 01/26/2014, 11/24/2016, 01/23/2019   PFIZER(Purple Top)SARS-COV-2 Vaccination 04/27/2019, 05/18/2019   Pneumococcal Conjugate-13 03/07/2014   Pneumococcal Polysaccharide-23 02/01/2020   Td 04/28/2005   Tdap 10/10/2014     Objective: Vital Signs: There were no vitals taken for this visit.   Physical Exam   Musculoskeletal Exam: ***  CDAI Exam: CDAI Score: -- Patient Global: --; Provider Global: -- Swollen: --; Tender: -- Joint Exam 03/31/2024   No joint exam has been documented for this visit   There is currently no information documented on the homunculus. Go to the Rheumatology activity and complete the homunculus joint exam.  Investigation: No additional findings.  Imaging: No results found.  Recent Labs: Lab Results  Component Value Date   WBC 6.6 04/30/2023   HGB 13.4 04/30/2023   PLT 332 04/30/2023   NA 140 01/06/2024   K 3.9  01/06/2024   CL 103 01/06/2024   CO2 25 01/06/2024   GLUCOSE 87 01/06/2024   BUN 19 01/06/2024   CREATININE 0.77 01/06/2024   BILITOT 0.5 01/06/2024   ALKPHOS 65 01/06/2024   AST 28 01/06/2024   ALT 26 01/06/2024   PROT 7.0 01/06/2024   ALBUMIN 4.5 01/06/2024   CALCIUM 9.8 01/06/2024   GFRAA 83 09/08/2018    Speciality Comments: No specialty comments available.  Procedures:  No procedures performed Allergies: Amlodipine, Bee venom, Capsaicin, Etodolac, Sulfa antibiotics, Solifenacin, Pepto-bismol [bismuth], Porcine (pork) protein-containing drug products, Shrimp (diagnostic), Statins, Sulfonamide derivatives, and Duloxetine  hcl   Assessment / Plan:     Visit Diagnoses: Primary osteoarthritis of both hands  Primary osteoarthritis of both knees  History of bilateral carpal tunnel release  Trochanteric bursitis of both hips  Pain in both feet  DDD (degenerative disc disease), cervical  Spondylosis of lumbar spine  Chronic SI joint  pain  Other fatigue  Fibromyalgia  Primary insomnia  History of breast cancer  History of COPD  Factor V deficiency (HCC)  Orders: No orders of the defined types were placed in this encounter.  No orders of the defined types were placed in this encounter.   Face-to-face time spent with patient was *** minutes. Greater than 50% of time was spent in counseling and coordination of care.  Follow-Up Instructions: No follow-ups on file.   Waddell CHRISTELLA Craze, PA-C  Note - This record has been created using Dragon software.  Chart creation errors have been sought, but may not always  have been located. Such creation errors do not reflect on  the standard of medical care.     [1]  Social History Tobacco Use   Smoking status: Former    Current packs/day: 0.00    Average packs/day: 3.0 packs/day for 22.0 years (66.0 ttl pk-yrs)    Types: Cigarettes    Start date: 02/17/1978    Quit date: 02/18/2000    Years since quitting: 24.0    Passive exposure: Never   Smokeless tobacco: Never  Vaping Use   Vaping status: Never Used  Substance Use Topics   Alcohol use: Yes    Alcohol/week: 0.0 standard drinks of alcohol    Comment: RARELY   Drug use: Never   "

## 2024-03-31 ENCOUNTER — Ambulatory Visit: Admitting: Physician Assistant

## 2024-03-31 DIAGNOSIS — M170 Bilateral primary osteoarthritis of knee: Secondary | ICD-10-CM

## 2024-03-31 DIAGNOSIS — F5101 Primary insomnia: Secondary | ICD-10-CM

## 2024-03-31 DIAGNOSIS — M7061 Trochanteric bursitis, right hip: Secondary | ICD-10-CM

## 2024-03-31 DIAGNOSIS — Z8709 Personal history of other diseases of the respiratory system: Secondary | ICD-10-CM

## 2024-03-31 DIAGNOSIS — Z9889 Other specified postprocedural states: Secondary | ICD-10-CM

## 2024-03-31 DIAGNOSIS — R5383 Other fatigue: Secondary | ICD-10-CM

## 2024-03-31 DIAGNOSIS — M79671 Pain in right foot: Secondary | ICD-10-CM

## 2024-03-31 DIAGNOSIS — M47816 Spondylosis without myelopathy or radiculopathy, lumbar region: Secondary | ICD-10-CM

## 2024-03-31 DIAGNOSIS — M797 Fibromyalgia: Secondary | ICD-10-CM

## 2024-03-31 DIAGNOSIS — D682 Hereditary deficiency of other clotting factors: Secondary | ICD-10-CM

## 2024-03-31 DIAGNOSIS — M19042 Primary osteoarthritis, left hand: Secondary | ICD-10-CM

## 2024-03-31 DIAGNOSIS — G8929 Other chronic pain: Secondary | ICD-10-CM

## 2024-03-31 DIAGNOSIS — M5030 Other cervical disc degeneration, unspecified cervical region: Secondary | ICD-10-CM

## 2024-03-31 DIAGNOSIS — Z853 Personal history of malignant neoplasm of breast: Secondary | ICD-10-CM

## 2024-04-07 ENCOUNTER — Encounter: Admitting: Physical Medicine & Rehabilitation
